# Patient Record
Sex: Male | Born: 1955 | Race: Black or African American | Hispanic: No | Smoking: Never smoker
Health system: Southern US, Community
[De-identification: ages and names within clinical notes are randomized; demographics above are authoritative.]

## PROBLEM LIST (undated history)

## (undated) DIAGNOSIS — I639 Cerebral infarction, unspecified: Secondary | ICD-10-CM

## (undated) DIAGNOSIS — I10 Essential (primary) hypertension: Secondary | ICD-10-CM

## (undated) DIAGNOSIS — L738 Other specified follicular disorders: Secondary | ICD-10-CM

## (undated) DIAGNOSIS — E039 Hypothyroidism, unspecified: Secondary | ICD-10-CM

## (undated) DIAGNOSIS — Q742 Other congenital malformations of lower limb(s), including pelvic girdle: Secondary | ICD-10-CM

## (undated) DIAGNOSIS — E119 Type 2 diabetes mellitus without complications: Secondary | ICD-10-CM

## (undated) DIAGNOSIS — M1990 Unspecified osteoarthritis, unspecified site: Secondary | ICD-10-CM

## (undated) DIAGNOSIS — D171 Benign lipomatous neoplasm of skin and subcutaneous tissue of trunk: Secondary | ICD-10-CM

## (undated) DIAGNOSIS — N182 Chronic kidney disease, stage 2 (mild): Secondary | ICD-10-CM

## (undated) DIAGNOSIS — D869 Sarcoidosis, unspecified: Secondary | ICD-10-CM

## (undated) DIAGNOSIS — Z86010 Personal history of colonic polyps: Secondary | ICD-10-CM

## (undated) DIAGNOSIS — M7989 Other specified soft tissue disorders: Secondary | ICD-10-CM

## (undated) DIAGNOSIS — E785 Hyperlipidemia, unspecified: Secondary | ICD-10-CM

## (undated) DIAGNOSIS — N419 Inflammatory disease of prostate, unspecified: Secondary | ICD-10-CM

## (undated) DIAGNOSIS — G8929 Other chronic pain: Secondary | ICD-10-CM

## (undated) DIAGNOSIS — Z96651 Presence of right artificial knee joint: Secondary | ICD-10-CM

## (undated) HISTORY — DX: Hyperlipidemia, unspecified: E78.5

## (undated) HISTORY — DX: Type 2 diabetes mellitus without complications: E11.9

## (undated) HISTORY — DX: Other congenital malformations of lower limb(s), including pelvic girdle: Q74.2

## (undated) HISTORY — DX: Unspecified osteoarthritis, unspecified site: M19.90

## (undated) HISTORY — PX: HERNIA REPAIR: SHX51

## (undated) HISTORY — PX: TOOTH EXTRACTION: SUR596

## (undated) HISTORY — DX: Chronic kidney disease, stage 2 (mild): N18.2

## (undated) HISTORY — DX: Personal history of colonic polyps: Z86.010

## (undated) HISTORY — DX: Other specified soft tissue disorders: M79.89

## (undated) HISTORY — PX: COLONOSCOPY W/ POLYPECTOMY: SHX1380

## (undated) HISTORY — DX: Cerebral infarction, unspecified: I63.9

## (undated) HISTORY — DX: Inflammatory disease of prostate, unspecified: N41.9

## (undated) HISTORY — DX: Essential (primary) hypertension: I10

## (undated) HISTORY — PX: ESOPHAGOGASTRODUODENOSCOPY: SHX1529

## (undated) HISTORY — DX: Other specified follicular disorders: L73.8

## (undated) HISTORY — DX: Benign lipomatous neoplasm of skin and subcutaneous tissue of trunk: D17.1

## (undated) HISTORY — PX: EYE SURGERY: SHX253

## (undated) HISTORY — DX: Sarcoidosis, unspecified: D86.9

---
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
## 1998-02-16 ENCOUNTER — Encounter: Admission: RE | Admit: 1998-02-16 | Discharge: 1998-02-16 | Payer: Self-pay | Admitting: Family Medicine

## 1998-12-26 ENCOUNTER — Encounter: Admission: RE | Admit: 1998-12-26 | Discharge: 1998-12-26 | Payer: Self-pay | Admitting: Family Medicine

## 1999-02-13 ENCOUNTER — Encounter: Admission: RE | Admit: 1999-02-13 | Discharge: 1999-02-13 | Payer: Self-pay | Admitting: Family Medicine

## 1999-12-26 ENCOUNTER — Encounter: Admission: RE | Admit: 1999-12-26 | Discharge: 1999-12-26 | Payer: Self-pay | Admitting: Sports Medicine

## 1999-12-27 ENCOUNTER — Encounter: Admission: RE | Admit: 1999-12-27 | Discharge: 1999-12-27 | Payer: Self-pay

## 1999-12-27 ENCOUNTER — Encounter: Payer: Self-pay | Admitting: Family Medicine

## 1999-12-27 LAB — CONVERTED CEMR LAB: Uric Acid, Serum: 3.9 mg/dL

## 2000-01-02 ENCOUNTER — Encounter: Admission: RE | Admit: 2000-01-02 | Discharge: 2000-01-02 | Payer: Self-pay | Admitting: Family Medicine

## 2000-01-15 ENCOUNTER — Encounter: Admission: RE | Admit: 2000-01-15 | Discharge: 2000-01-15 | Payer: Self-pay | Admitting: Family Medicine

## 2000-04-22 ENCOUNTER — Encounter: Admission: RE | Admit: 2000-04-22 | Discharge: 2000-04-22 | Payer: Self-pay | Admitting: Family Medicine

## 2001-01-17 ENCOUNTER — Encounter: Admission: RE | Admit: 2001-01-17 | Discharge: 2001-01-17 | Payer: Self-pay | Admitting: Family Medicine

## 2001-02-12 ENCOUNTER — Encounter: Admission: RE | Admit: 2001-02-12 | Discharge: 2001-02-12 | Payer: Self-pay | Admitting: Family Medicine

## 2001-02-12 ENCOUNTER — Encounter: Payer: Self-pay | Admitting: Family Medicine

## 2001-02-12 LAB — CONVERTED CEMR LAB
Blood Glucose, Fasting: 300 mg/dL
Hgb A1c MFr Bld: 11.2 %

## 2001-06-17 ENCOUNTER — Encounter: Admission: RE | Admit: 2001-06-17 | Discharge: 2001-06-17 | Payer: Self-pay | Admitting: Family Medicine

## 2003-03-04 ENCOUNTER — Encounter: Admission: RE | Admit: 2003-03-04 | Discharge: 2003-03-04 | Payer: Self-pay | Admitting: Family Medicine

## 2003-04-08 ENCOUNTER — Encounter: Admission: RE | Admit: 2003-04-08 | Discharge: 2003-04-08 | Payer: Self-pay | Admitting: Sports Medicine

## 2003-07-10 HISTORY — PX: TRANSTHORACIC ECHOCARDIOGRAM: SHX275

## 2004-03-15 ENCOUNTER — Encounter: Payer: Self-pay | Admitting: Cardiology

## 2004-03-15 ENCOUNTER — Inpatient Hospital Stay (HOSPITAL_COMMUNITY): Admission: EM | Admit: 2004-03-15 | Discharge: 2004-03-16 | Payer: Self-pay | Admitting: Emergency Medicine

## 2004-03-16 ENCOUNTER — Encounter: Payer: Self-pay | Admitting: Family Medicine

## 2004-03-16 LAB — CONVERTED CEMR LAB: Hgb A1c MFr Bld: 7.7 %

## 2004-03-29 ENCOUNTER — Ambulatory Visit: Payer: Self-pay | Admitting: Family Medicine

## 2004-04-19 ENCOUNTER — Ambulatory Visit: Payer: Self-pay | Admitting: Family Medicine

## 2004-06-06 ENCOUNTER — Ambulatory Visit: Payer: Self-pay | Admitting: Sports Medicine

## 2004-06-29 ENCOUNTER — Encounter: Payer: Self-pay | Admitting: Family Medicine

## 2004-06-29 ENCOUNTER — Ambulatory Visit: Payer: Self-pay | Admitting: Sports Medicine

## 2004-06-29 LAB — CONVERTED CEMR LAB: Anti Nuclear Antibody(ANA): NEGATIVE

## 2004-07-09 HISTORY — PX: ERCP: SHX60

## 2004-07-21 ENCOUNTER — Emergency Department (HOSPITAL_COMMUNITY): Admission: EM | Admit: 2004-07-21 | Discharge: 2004-07-21 | Payer: Self-pay | Admitting: *Deleted

## 2004-07-26 ENCOUNTER — Encounter: Payer: Self-pay | Admitting: Family Medicine

## 2004-07-26 ENCOUNTER — Ambulatory Visit: Payer: Self-pay | Admitting: Family Medicine

## 2004-07-26 LAB — CONVERTED CEMR LAB
ALT: 69 units/L
Albumin: 3.7 g/dL
BUN: 14 mg/dL
CO2: 26 meq/L
Creatinine, Ser: 1.3 mg/dL
Glucose, Bld: 93 mg/dL

## 2004-08-02 ENCOUNTER — Encounter: Admission: RE | Admit: 2004-08-02 | Discharge: 2004-08-02 | Payer: Self-pay | Admitting: Sports Medicine

## 2004-08-25 ENCOUNTER — Encounter: Payer: Self-pay | Admitting: Family Medicine

## 2004-08-25 LAB — CONVERTED CEMR LAB: Prothrombin Time: 13 s

## 2004-08-31 ENCOUNTER — Ambulatory Visit (HOSPITAL_COMMUNITY): Admission: RE | Admit: 2004-08-31 | Discharge: 2004-08-31 | Payer: Self-pay | Admitting: Gastroenterology

## 2004-09-21 ENCOUNTER — Ambulatory Visit (HOSPITAL_COMMUNITY): Admission: RE | Admit: 2004-09-21 | Discharge: 2004-09-21 | Payer: Self-pay | Admitting: Gastroenterology

## 2004-10-26 ENCOUNTER — Ambulatory Visit: Payer: Self-pay | Admitting: Family Medicine

## 2004-10-26 ENCOUNTER — Encounter: Payer: Self-pay | Admitting: Family Medicine

## 2005-12-13 ENCOUNTER — Ambulatory Visit: Payer: Self-pay | Admitting: Family Medicine

## 2005-12-13 ENCOUNTER — Encounter: Payer: Self-pay | Admitting: Family Medicine

## 2005-12-13 LAB — CONVERTED CEMR LAB
CO2: 27 meq/L
Calcium: 9.1 mg/dL
Creatinine, Ser: 1.57 mg/dL
Glucose, Bld: 95 mg/dL
Hgb A1c MFr Bld: 5.1 %
Potassium: 3.7 meq/L
Sodium: 139 meq/L
TSH: 2.194 microintl units/mL
Total Bilirubin: 0.4 mg/dL
Total Protein: 7.9 g/dL

## 2005-12-28 ENCOUNTER — Encounter: Payer: Self-pay | Admitting: Family Medicine

## 2005-12-28 ENCOUNTER — Ambulatory Visit (HOSPITAL_COMMUNITY): Admission: RE | Admit: 2005-12-28 | Discharge: 2005-12-28 | Payer: Self-pay | Admitting: Family Medicine

## 2005-12-28 ENCOUNTER — Ambulatory Visit: Payer: Self-pay | Admitting: Family Medicine

## 2005-12-28 LAB — CONVERTED CEMR LAB: Creatinine, Ser: 1.42 mg/dL

## 2006-01-02 ENCOUNTER — Encounter: Payer: Self-pay | Admitting: Family Medicine

## 2006-01-02 ENCOUNTER — Ambulatory Visit: Payer: Self-pay | Admitting: Family Medicine

## 2006-01-02 LAB — CONVERTED CEMR LAB
HDL: 43 mg/dL
LDL Cholesterol: 101 mg/dL

## 2006-01-07 ENCOUNTER — Ambulatory Visit: Payer: Self-pay | Admitting: Family Medicine

## 2006-01-07 ENCOUNTER — Encounter: Payer: Self-pay | Admitting: Family Medicine

## 2006-01-22 ENCOUNTER — Ambulatory Visit: Payer: Self-pay | Admitting: Family Medicine

## 2006-01-25 ENCOUNTER — Ambulatory Visit (HOSPITAL_COMMUNITY): Admission: RE | Admit: 2006-01-25 | Discharge: 2006-01-25 | Payer: Self-pay | Admitting: Sports Medicine

## 2006-02-21 ENCOUNTER — Ambulatory Visit: Payer: Self-pay | Admitting: Family Medicine

## 2006-05-07 ENCOUNTER — Ambulatory Visit: Payer: Self-pay | Admitting: Sports Medicine

## 2006-05-07 ENCOUNTER — Encounter: Payer: Self-pay | Admitting: Family Medicine

## 2006-05-07 LAB — CONVERTED CEMR LAB: Hgb A1c MFr Bld: 4.7 %

## 2006-06-03 ENCOUNTER — Ambulatory Visit: Payer: Self-pay | Admitting: Sports Medicine

## 2006-07-05 ENCOUNTER — Ambulatory Visit: Payer: Self-pay | Admitting: Family Medicine

## 2006-07-05 ENCOUNTER — Encounter: Payer: Self-pay | Admitting: Family Medicine

## 2006-07-05 LAB — CONVERTED CEMR LAB
CO2: 28 meq/L
Calcium: 9.2 mg/dL

## 2006-09-05 DIAGNOSIS — N183 Chronic kidney disease, stage 3 unspecified: Secondary | ICD-10-CM

## 2006-09-05 DIAGNOSIS — N189 Chronic kidney disease, unspecified: Secondary | ICD-10-CM | POA: Insufficient documentation

## 2006-09-05 HISTORY — DX: Chronic kidney disease, stage 3 unspecified: N18.30

## 2006-10-18 ENCOUNTER — Encounter: Payer: Self-pay | Admitting: Family Medicine

## 2006-10-18 ENCOUNTER — Ambulatory Visit: Payer: Self-pay | Admitting: Family Medicine

## 2006-10-18 DIAGNOSIS — I10 Essential (primary) hypertension: Secondary | ICD-10-CM | POA: Insufficient documentation

## 2006-10-18 DIAGNOSIS — F528 Other sexual dysfunction not due to a substance or known physiological condition: Secondary | ICD-10-CM

## 2006-10-18 DIAGNOSIS — F4323 Adjustment disorder with mixed anxiety and depressed mood: Secondary | ICD-10-CM

## 2006-10-18 DIAGNOSIS — E119 Type 2 diabetes mellitus without complications: Secondary | ICD-10-CM

## 2006-10-18 HISTORY — DX: Other sexual dysfunction not due to a substance or known physiological condition: F52.8

## 2006-10-18 HISTORY — DX: Adjustment disorder with mixed anxiety and depressed mood: F43.23

## 2006-10-18 LAB — CONVERTED CEMR LAB
ALT: 20 units/L (ref 0–53)
Albumin: 4.4 g/dL (ref 3.5–5.2)
Alkaline Phosphatase: 53 units/L (ref 39–117)
CO2: 25 meq/L (ref 19–32)
Calcium: 9.2 mg/dL (ref 8.4–10.5)
Glucose, Bld: 109 mg/dL — ABNORMAL HIGH (ref 70–99)

## 2006-12-12 ENCOUNTER — Telehealth: Payer: Self-pay | Admitting: Family Medicine

## 2007-01-13 ENCOUNTER — Encounter: Payer: Self-pay | Admitting: *Deleted

## 2007-01-20 ENCOUNTER — Telehealth: Payer: Self-pay | Admitting: Family Medicine

## 2007-01-21 ENCOUNTER — Emergency Department (HOSPITAL_COMMUNITY): Admission: EM | Admit: 2007-01-21 | Discharge: 2007-01-21 | Payer: Self-pay | Admitting: Emergency Medicine

## 2007-01-29 ENCOUNTER — Encounter: Payer: Self-pay | Admitting: Family Medicine

## 2007-02-20 ENCOUNTER — Ambulatory Visit: Payer: Self-pay | Admitting: Family Medicine

## 2007-02-20 DIAGNOSIS — E785 Hyperlipidemia, unspecified: Secondary | ICD-10-CM

## 2007-02-20 DIAGNOSIS — D869 Sarcoidosis, unspecified: Secondary | ICD-10-CM

## 2007-02-20 DIAGNOSIS — E1169 Type 2 diabetes mellitus with other specified complication: Secondary | ICD-10-CM

## 2007-02-20 DIAGNOSIS — S82009A Unspecified fracture of unspecified patella, initial encounter for closed fracture: Secondary | ICD-10-CM | POA: Insufficient documentation

## 2007-02-20 DIAGNOSIS — Z8679 Personal history of other diseases of the circulatory system: Secondary | ICD-10-CM | POA: Insufficient documentation

## 2007-02-20 LAB — CONVERTED CEMR LAB
Cholesterol, target level: 200 mg/dL
HDL goal, serum: 40 mg/dL
Hgb A1c MFr Bld: 5.5 %
LDL Goal: 100 mg/dL

## 2007-03-13 ENCOUNTER — Telehealth (INDEPENDENT_AMBULATORY_CARE_PROVIDER_SITE_OTHER): Payer: Self-pay | Admitting: *Deleted

## 2007-03-23 ENCOUNTER — Encounter: Payer: Self-pay | Admitting: Family Medicine

## 2007-03-23 DIAGNOSIS — Z87448 Personal history of other diseases of urinary system: Secondary | ICD-10-CM

## 2007-05-05 ENCOUNTER — Encounter: Payer: Self-pay | Admitting: Family Medicine

## 2007-05-05 ENCOUNTER — Ambulatory Visit: Payer: Self-pay | Admitting: Family Medicine

## 2007-05-05 LAB — CONVERTED CEMR LAB
BUN: 18 mg/dL (ref 6–23)
Calcium: 9.4 mg/dL (ref 8.4–10.5)
Chloride: 100 meq/L (ref 96–112)
Creatinine, Ser: 1.25 mg/dL (ref 0.40–1.50)
Glucose, Bld: 112 mg/dL — ABNORMAL HIGH (ref 70–99)
LDL Cholesterol: 103 mg/dL — ABNORMAL HIGH (ref 0–99)
Potassium: 4.2 meq/L (ref 3.5–5.3)
Triglycerides: 99 mg/dL (ref <150)
VLDL: 20 mg/dL (ref 0–40)

## 2007-05-12 ENCOUNTER — Ambulatory Visit: Payer: Self-pay | Admitting: Family Medicine

## 2007-05-12 ENCOUNTER — Encounter: Payer: Self-pay | Admitting: Family Medicine

## 2007-05-12 DIAGNOSIS — M13179 Monoarthritis, not elsewhere classified, unspecified ankle and foot: Secondary | ICD-10-CM | POA: Insufficient documentation

## 2007-05-16 ENCOUNTER — Encounter: Admission: RE | Admit: 2007-05-16 | Discharge: 2007-05-16 | Payer: Self-pay | Admitting: Sports Medicine

## 2007-05-19 ENCOUNTER — Encounter: Payer: Self-pay | Admitting: Family Medicine

## 2007-05-27 ENCOUNTER — Encounter: Payer: Self-pay | Admitting: Family Medicine

## 2007-07-17 ENCOUNTER — Encounter: Payer: Self-pay | Admitting: Family Medicine

## 2007-11-03 ENCOUNTER — Encounter: Payer: Self-pay | Admitting: Family Medicine

## 2008-01-14 ENCOUNTER — Encounter: Payer: Self-pay | Admitting: Family Medicine

## 2008-01-22 ENCOUNTER — Telehealth: Payer: Self-pay | Admitting: Family Medicine

## 2008-01-22 ENCOUNTER — Encounter: Payer: Self-pay | Admitting: Family Medicine

## 2008-01-30 ENCOUNTER — Telehealth: Payer: Self-pay | Admitting: Family Medicine

## 2008-03-10 ENCOUNTER — Ambulatory Visit: Payer: Self-pay | Admitting: Family Medicine

## 2008-03-10 DIAGNOSIS — K047 Periapical abscess without sinus: Secondary | ICD-10-CM | POA: Insufficient documentation

## 2008-03-10 DIAGNOSIS — L738 Other specified follicular disorders: Secondary | ICD-10-CM

## 2008-03-10 DIAGNOSIS — L678 Other hair color and hair shaft abnormalities: Secondary | ICD-10-CM

## 2008-03-10 HISTORY — DX: Other hair color and hair shaft abnormalities: L67.8

## 2008-03-10 HISTORY — DX: Other specified follicular disorders: L73.8

## 2008-03-10 LAB — CONVERTED CEMR LAB: Hgb A1c MFr Bld: 6.9 %

## 2008-03-17 ENCOUNTER — Encounter: Payer: Self-pay | Admitting: Family Medicine

## 2008-03-18 ENCOUNTER — Ambulatory Visit: Payer: Self-pay | Admitting: Family Medicine

## 2008-03-18 ENCOUNTER — Encounter: Payer: Self-pay | Admitting: Family Medicine

## 2008-03-22 LAB — CONVERTED CEMR LAB
ALT: 17 units/L (ref 0–53)
AST: 18 units/L (ref 0–37)
Albumin: 4.5 g/dL (ref 3.5–5.2)
Alkaline Phosphatase: 79 units/L (ref 39–117)
CO2: 25 meq/L (ref 19–32)
Cholesterol: 155 mg/dL (ref 0–200)
Creatinine, Ser: 1.33 mg/dL (ref 0.40–1.50)
Glucose, Bld: 180 mg/dL — ABNORMAL HIGH (ref 70–99)
Potassium: 3.4 meq/L — ABNORMAL LOW (ref 3.5–5.3)
Total CHOL/HDL Ratio: 2.5
Total Protein: 8.5 g/dL — ABNORMAL HIGH (ref 6.0–8.3)
VLDL: 24 mg/dL (ref 0–40)

## 2008-04-28 ENCOUNTER — Encounter: Payer: Self-pay | Admitting: Family Medicine

## 2008-06-21 ENCOUNTER — Telehealth: Payer: Self-pay | Admitting: Family Medicine

## 2009-03-15 ENCOUNTER — Telehealth: Payer: Self-pay | Admitting: *Deleted

## 2009-03-23 ENCOUNTER — Telehealth (INDEPENDENT_AMBULATORY_CARE_PROVIDER_SITE_OTHER): Payer: Self-pay | Admitting: *Deleted

## 2009-03-25 ENCOUNTER — Ambulatory Visit: Payer: Self-pay | Admitting: Family Medicine

## 2009-03-25 DIAGNOSIS — M545 Low back pain: Secondary | ICD-10-CM

## 2009-04-28 ENCOUNTER — Ambulatory Visit: Payer: Self-pay | Admitting: Family Medicine

## 2009-04-28 ENCOUNTER — Encounter: Payer: Self-pay | Admitting: Family Medicine

## 2009-04-28 DIAGNOSIS — M25562 Pain in left knee: Secondary | ICD-10-CM

## 2009-04-28 DIAGNOSIS — M25561 Pain in right knee: Secondary | ICD-10-CM | POA: Insufficient documentation

## 2009-04-29 LAB — CONVERTED CEMR LAB
ALT: 13 units/L (ref 0–53)
AST: 16 units/L (ref 0–37)
Albumin: 4.3 g/dL (ref 3.5–5.2)
Alkaline Phosphatase: 61 units/L (ref 39–117)
CO2: 25 meq/L (ref 19–32)
Calcium: 9.6 mg/dL (ref 8.4–10.5)
Chloride: 103 meq/L (ref 96–112)
Cholesterol: 126 mg/dL (ref 0–200)
HDL: 57 mg/dL (ref >39)
Sodium: 140 meq/L (ref 135–145)
TSH: 4.205 microintl units/mL (ref 0.350–4.500)
Total Bilirubin: 0.4 mg/dL (ref 0.3–1.2)

## 2009-05-06 ENCOUNTER — Telehealth: Payer: Self-pay | Admitting: Family Medicine

## 2009-05-20 ENCOUNTER — Ambulatory Visit: Payer: Self-pay | Admitting: Family Medicine

## 2009-05-20 DIAGNOSIS — E669 Obesity, unspecified: Secondary | ICD-10-CM | POA: Insufficient documentation

## 2009-08-16 ENCOUNTER — Telehealth: Payer: Self-pay | Admitting: *Deleted

## 2009-08-24 ENCOUNTER — Encounter: Payer: Self-pay | Admitting: Family Medicine

## 2009-08-25 ENCOUNTER — Telehealth: Payer: Self-pay | Admitting: Family Medicine

## 2010-01-06 ENCOUNTER — Encounter: Payer: Self-pay | Admitting: Family Medicine

## 2010-01-06 ENCOUNTER — Encounter: Payer: Self-pay | Admitting: *Deleted

## 2010-02-07 ENCOUNTER — Encounter: Payer: Self-pay | Admitting: Family Medicine

## 2010-02-07 ENCOUNTER — Ambulatory Visit: Payer: Self-pay | Admitting: Family Medicine

## 2010-02-07 LAB — CONVERTED CEMR LAB
ALT: 16 units/L (ref 0–53)
AST: 17 units/L (ref 0–37)
Albumin: 4.3 g/dL (ref 3.5–5.2)
CO2: 26 meq/L (ref 19–32)
Cholesterol: 137 mg/dL (ref 0–200)
Glucose, Bld: 162 mg/dL — ABNORMAL HIGH (ref 70–99)
LDL Cholesterol: 57 mg/dL (ref 0–99)
Total Bilirubin: 0.5 mg/dL (ref 0.3–1.2)
Total CHOL/HDL Ratio: 2.4
Total Protein: 8.1 g/dL (ref 6.0–8.3)
VLDL: 22 mg/dL (ref 0–40)

## 2010-02-20 ENCOUNTER — Ambulatory Visit: Payer: Self-pay | Admitting: Family Medicine

## 2010-07-25 ENCOUNTER — Ambulatory Visit: Admission: RE | Admit: 2010-07-25 | Discharge: 2010-07-25 | Payer: Self-pay | Source: Home / Self Care

## 2010-07-25 DIAGNOSIS — Q742 Other congenital malformations of lower limb(s), including pelvic girdle: Secondary | ICD-10-CM

## 2010-07-25 HISTORY — DX: Other congenital malformations of lower limb(s), including pelvic girdle: Q74.2

## 2010-07-25 LAB — CONVERTED CEMR LAB: Hgb A1c MFr Bld: 6.5 %

## 2010-07-30 ENCOUNTER — Encounter: Payer: Self-pay | Admitting: Gastroenterology

## 2010-08-08 NOTE — Assessment & Plan Note (Signed)
Summary: F/U-Diabetes/HTN/HLD/ Tooth Abscess   Vital Signs:  Patient profile:   55 year old male Height:      70 inches Weight:      225.6 pounds Temp:     98.6 degrees F oral Pulse rate:   73 / minute Pulse rhythm:   regular BP sitting:   151 / 93  (right arm) Cuff size:   large  Vitals Entered By: Loralee Pacas CMA (February 20, 2010 1:37 PM)  Primary Provider:  Everrett Coombe DO   History of Present Illness: 1. HTN- Pt. still with elevated BP, does not check at home.  Has not had any chest pain, cough, shortness of breath, headache.  Tolerating medications well  2.  T2DM:  Patient checks cbg's at home occassionally usually <200.  Has not had any times where he has felt like his blood sugars are too low.  Currently on metformin 500 in am, 1000 at bedtime and tolerating well without side effects.  Plans to make eye exam appt. for later this year  3.  Hyperlipidemia:  Tolerating simvastatin 80mg  well, no complaints of muscle aches/weakness  4. Tooth pain-  Has improved with antibiotics and mouth wash, plan to have extraction at dentist in first part of september, denies any trouble speaking, swallowing or sore throat     Diabetic Foot Exam Foot Inspection Is there a history of a foot ulcer?              No Is there a foot ulcer now?              No Can the patient see the bottom of their feet?          No Are the shoes appropriate in style and fit?          No Is there swelling or an abnormal foot shape?          No Are the toenails long?                No Are the toenails thick?                No Are the toenails ingrown?              No Is there heavy callous build-up?              No Is there pain in the calf muscle (Intermittent claudication) when walking?    NoIs there a claw toe deformity?              No Is there elevated skin temperature?            No Is there limited ankle dorsiflexion?            No Is there foot or ankle muscle weakness?            No  Diabetic  Foot Care Education Patient educated on appropriate care of diabetic feet.  Pulse Check          Right Foot          Left Foot Posterior Tibial:        normal            normal Dorsalis Pedis:        normal            normal  High Risk Feet? No   10-g (5.07) Semmes-Weinstein Monofilament Test           Right  Foot          Left Foot Visual Inspection     normal           normal Test Control      normal         normal Site 1         normal         normal Site 4         normal          Site 5         normal         normal Site 6         normal         normal  Current Medications (verified): 1)  Bayer Childrens Aspirin 81 Mg  Chew (Aspirin) .... One By Mouth Once Daily 2)  Enalapril Maleate 20 Mg Tabs (Enalapril Maleate) .... Take 2 Tablet By Mouth Once A Day 3)  Maxzide-25 37.5-25 Mg Tabs (Triamterene-Hctz) .... Take 1 Tablet By Mouth Once A Day 4)  Norvasc 10 Mg Tabs (Amlodipine Besylate) .... Take 1 Tablet By Mouth Once A Day 5)  Trazodone Hcl 100 Mg Tabs (Trazodone Hcl) .Marland Kitchen.. 1 Tablet By Mouth At Bedtime 6)  Simvastatin 80 Mg  Tabs (Simvastatin) .... Take Half Tablet By Mouth Every Day 7)  Glucometer With Lancets and Strips .... Use As Directed Qs 1 Mo 8)  Metformin Hcl 500 Mg Tabs (Metformin Hcl) .... One By Mouth in Am and Two in Pm 9)  Penicillin V Potassium 500 Mg Tabs (Penicillin V Potassium) .... Take One By Mouth Three Times A Day X 10 Days 10)  Peridex 0.12 % Soln (Chlorhexidine Gluconate) .... Use Two Times A Day  Allergies (verified): 1)  Metoprolol Succinate (Metoprolol Succinate)  Past History:  Past Surgical History: Echo 9/05-Echo 60% - 10/02/2004, ERCP- normal, except fluctuating cholestasis - 09/06/2004, Renal US: No hydronephrosis, no mass - 01/28/2006 s/p 2 hernia repairs Had colonoscopy >10 years ago, does not know what for.  He does not remember any changes in bowel habits, blood in stool, or relatives with early colon ca  Review of Systems       Pertinent  positives and negatives noted in HPI, Vitals signs noted   Physical Exam  General:  Well-developed,well-nourished,in no acute distress; alert,appropriate and cooperative throughout examination Eyes:  No corneal or conjunctival inflammation noted. EOMI. Perrla. Mouth:  poor dentition and teeth missing, no pharyngeal erythema or tosilar exudates Neck:  No deformities, masses, or tenderness noted. Lungs:  Normal respiratory effort, chest expands symmetrically. Lungs are clear to auscultation, no crackles or wheezes. Heart:  Normal rate and regular rhythm. S1 and S2 normal without gallop, murmur, click, rub or other extra sounds. Abdomen:  Bowel sounds positive,abdomen soft and non-tender without masses, organomegaly or hernias noted. Extremities:  Trace edema bilaterally in LE   Impression & Recommendations:  Problem # 1:  HYPERTENSION, ESSENTIAL NOS (ICD-401.9) BP still remains elevated despite multiple medications, Recheck of BP is better than when initially triaged at 136/86.  Still I would like for him to be <130/80 given his comorbidites of DM, hyperlipidemia, and previous CVA.  Will hold off on starting another medication for now but encouraged him to lose weight and try to limit his salt intake.   His updated medication list for this problem includes:    Enalapril Maleate 20 Mg Tabs (Enalapril maleate) .Marland Kitchen... Take 2 tablet by mouth once a day  Maxzide-25 37.5-25 Mg Tabs (Triamterene-hctz) .Marland Kitchen... Take 1 tablet by mouth once a day    Norvasc 10 Mg Tabs (Amlodipine besylate) .Marland Kitchen... Take 1 tablet by mouth once a day  Problem # 2:  DM, UNCOMPLICATED, TYPE II (ICD-250.00) A1C improved from previous, he is closer to his goal of 7.0.  Again lifestyle changes may help him reach both his goals for A1C as well as BP.  Would like to see him back in 3 months to see how his A1C is.  Currently on ACE-I, metformin and aspirin, tolerating well His updated medication list for this problem includes:     Bayer Childrens Aspirin 81 Mg Chew (Aspirin) ..... One by mouth once daily    Enalapril Maleate 20 Mg Tabs (Enalapril maleate) .Marland Kitchen... Take 2 tablet by mouth once a day    Metformin Hcl 500 Mg Tabs (Metformin hcl) ..... One by mouth in am and two in pm  Problem # 3:  HYPERLIPIDEMIA, BORDERLINE HDL (ICD-272.4) Lipids doing well, LDL and HDL at goal.  Tolerating simvastatin 80 mg well, no complaints of myalgias His updated medication list for this problem includes:    Simvastatin 80 Mg Tabs (Simvastatin) .Marland Kitchen... Take half tablet by mouth every day  Problem # 4:  ABSCESS, TOOTH (ICD-522.5) Improved with penicillin and mouthwash, he plans to have teeth extracted at  dentist in early september.  Complete Medication List: 1)  Bayer Childrens Aspirin 81 Mg Chew (Aspirin) .... One by mouth once daily 2)  Enalapril Maleate 20 Mg Tabs (Enalapril maleate) .... Take 2 tablet by mouth once a day 3)  Maxzide-25 37.5-25 Mg Tabs (Triamterene-hctz) .... Take 1 tablet by mouth once a day 4)  Norvasc 10 Mg Tabs (Amlodipine besylate) .... Take 1 tablet by mouth once a day 5)  Trazodone Hcl 100 Mg Tabs (Trazodone hcl) .Marland Kitchen.. 1 tablet by mouth at bedtime 6)  Simvastatin 80 Mg Tabs (Simvastatin) .... Take half tablet by mouth every day 7)  Glucometer With Lancets and Strips  .... Use as directed qs 1 mo 8)  Metformin Hcl 500 Mg Tabs (Metformin hcl) .... One by mouth in am and two in pm 9)  Penicillin V Potassium 500 Mg Tabs (Penicillin v potassium) .... Take one by mouth three times a day x 10 days 10)  Peridex 0.12 % Soln (Chlorhexidine gluconate) .... Use two times a day  Patient Instructions: 1)  It was a pleasure meeting you today. 2)  You are doing well with your diabetes and cholesterol 3)  Your blood pressure is still elevated, I don't want to add another medication at this time so I would encourage a little bit of weight loss.  Even a change of 3-4 pounds can help bring down your blood pressure and improve  your diabetes.  4)  For the colonoscopy you can just call your GI doctor to set up your screening colonoscopy. 5)  I would like to see you back in 3 months to recheck your A1C and to see how your blood pressure is doing.  Prevention & Chronic Care Immunizations   Influenza vaccine: Fluvax 3+  (04/28/2009)   Influenza vaccine due: 03/09/2010    Tetanus booster: 05/09/2004: Done.   Tetanus booster due: 05/09/2014    Pneumococcal vaccine: Not documented  Colorectal Screening   Hemoccult: Not documented    Colonoscopy: Not documented  Other Screening   PSA: Not documented   Smoking status: never  (02/07/2010)  Diabetes Mellitus   HgbA1C: 7.3  (02/07/2010)  Hemoglobin A1C due: 05/10/2010    Eye exam: Not documented   Diabetic eye exam action/deferral: Ophthalmology referral  (04/28/2009)    Foot exam: yes  (03/25/2009)   Foot exam action/deferral: Do today   High risk foot: No  (02/20/2010)   Foot care education: Done  (02/20/2010)   Foot exam due: 02/20/2008    Urine microalbumin/creatinine ratio: Not documented    Diabetes flowsheet reviewed?: Yes   Progress toward A1C goal: Improved  Lipids   Total Cholesterol: 137  (02/07/2010)   LDL: 57  (02/07/2010)   LDL Direct: Not documented   HDL: 58  (02/07/2010)   Triglycerides: 109  (02/07/2010)   Lipid panel due: 03/10/2010    SGOT (AST): 17  (02/07/2010)   SGPT (ALT): 16  (02/07/2010)   Alkaline phosphatase: 52  (02/07/2010)   Total bilirubin: 0.5  (02/07/2010)   Liver panel due: 03/10/2010    Lipid flowsheet reviewed?: Yes   Progress toward LDL goal: At goal    Stage of readiness to change (lipid management): Maintenance  Hypertension   Last Blood Pressure: 151 / 93  (02/20/2010)   Serum creatinine: 1.18  (02/07/2010)   Serum potassium 3.7  (02/07/2010)    Hypertension flowsheet reviewed?: Yes   Progress toward BP goal: Unchanged  Self-Management Support :   Personal Goals (by the next clinic visit)  :     Personal A1C goal: 7  (03/25/2009)     Personal blood pressure goal: 130/80  (02/20/2010)     Personal LDL goal: 70  (02/20/2010)    Diabetes self-management support: Copy of home glucose meter record, CBG self-monitoring log  (05/20/2009)    Hypertension self-management support: Written self-care plan, Education handout  (04/28/2009)    Lipid self-management support: Not documented    Nursing Instructions:     Appended Document: F/U-Diabetes/HTN/HLD/ Tooth Abscess     Allergies: 1)  Metoprolol Succinate (Metoprolol Succinate)   Complete Medication List: 1)  Bayer Childrens Aspirin 81 Mg Chew (Aspirin) .... One by mouth once daily 2)  Enalapril Maleate 20 Mg Tabs (Enalapril maleate) .... Take 2 tablet by mouth once a day 3)  Maxzide-25 37.5-25 Mg Tabs (Triamterene-hctz) .... Take 1 tablet by mouth once a day 4)  Norvasc 10 Mg Tabs (Amlodipine besylate) .... Take 1 tablet by mouth once a day 5)  Trazodone Hcl 100 Mg Tabs (Trazodone hcl) .Marland Kitchen.. 1 tablet by mouth at bedtime 6)  Simvastatin 80 Mg Tabs (Simvastatin) .... Take half tablet by mouth every day 7)  Glucometer With Lancets and Strips  .... Use as directed qs 1 mo 8)  Metformin Hcl 500 Mg Tabs (Metformin hcl) .... One by mouth in am and two in pm 9)  Penicillin V Potassium 500 Mg Tabs (Penicillin v potassium) .... Take one by mouth three times a day x 10 days 10)  Peridex 0.12 % Soln (Chlorhexidine gluconate) .... Use two times a day  Other Orders: [facility]- Est  Level 4 (91478)

## 2010-08-08 NOTE — Progress Notes (Signed)
Summary: refill  Phone Note Refill Request Call back at Home Phone [phone] Message from:  Patient  Refills Requested: Medication #1:  MOBIC 15 MG TABS take ONE daily x 10 days then prn  Medication #2:  PENICILLIN V POTASSIUM 500 MG TABS take one by mouth three times a day x 10 days. [employer] [facility] - fax- [phone]  Initial call taken by: De Nurse,  August 25, 2009 2:17 PM Caller: Patient  Follow-up for Phone Call        will refill x 1 more time BUT patient needs appointment.  please call and inform and schedule.  please fax to # provided by patient.  thanks. Follow-up by: Eustaquio Boyden  MD,  August 25, 2009 2:22 PM  Additional Follow-up for Phone Call Additional follow up Details #1::        faxed rx. called pt & told him that it had been done but he needed to be seen. states he will call after he gets back "from this court date in [place]" Additional Follow-up by: Golden Circle RN,  August 25, 2009 3:29 PM    Prescriptions: MOBIC 15 MG TABS (MELOXICAM) take ONE daily x 10 days then prn  #15 x 0   Entered and Authorized by:   Eustaquio Boyden  MD   Signed by:   Eustaquio Boyden  MD on 08/25/2009   Method used:   Print then Give to Patient   RxID:   [id number] PENICILLIN V POTASSIUM 500 MG TABS (PENICILLIN V POTASSIUM) take one by mouth three times a day x 10 days  #30 x 0   Entered and Authorized by:   Eustaquio Boyden  MD   Signed by:   Eustaquio Boyden  MD on 08/25/2009   Method used:   Print then Give to Patient   RxID:   [id number]

## 2010-08-08 NOTE — Letter (Signed)
Summary: Generic Letter  [facility]  [address]   [place], [place] [address]   Phone: [phone]  Fax: [phone]    01/06/2010  [facility] [address] [place], [place]  [address]  Dear Mr. [facility],  We have been unable to reach you by phone. We need to make you an appointment right away so you can keep getting your medicines and proper care. Please call the office when you gat this letter & make an appt for the same day or next. Thank you.  [phone]        Sincerely,   Golden Circle RN

## 2010-08-08 NOTE — Miscellaneous (Signed)
Summary: refills denied  Clinical Lists Changes rec'd refill request from [facility]. denied per md. faxed them back. called pt to make him an appt today. no answer at [phone]. called another number in chart [phone]. no answer. I mailed him a letter .Golden Circle RN  January 06, 2010 11:26 AM

## 2010-08-08 NOTE — Progress Notes (Signed)
Summary: re: records/ts  Phone Note Call from Patient Call back at Home Phone [phone]   Caller: Patient Summary of Call: Needs to talk to Dr. Sharen Hones about his medical records because the pt has to go to court. Initial call taken by: Clydell Hakim,  August 16, 2009 10:47 AM  Follow-up for Phone Call        CALLED PT. pt needs his records for a court date in [place]. pt needs to come in and fill out roi. may have to pay fee for retrieval of chart out of storage. will be billed for completion of copying and will have to pay the bill before receiving records. pt agreed and verbalized understanding. Follow-up by: Arlyss Repress CMA,,  August 16, 2009 11:37 AM

## 2010-08-08 NOTE — Assessment & Plan Note (Signed)
Summary: infection in mouth,tcb   Vital Signs:  Patient profile:   55 year old male Height:      70 inches Weight:      220.7 pounds Pulse rate:   84 / minute BP sitting:   157 / 89  (left arm) Cuff size:   regular  Vitals Entered By: Arlyss Repress CMA, (February 07, 2010 10:19 AM) CC: refill all meds. request rx for abx before seeing his dentist. Is Patient Diabetic? Yes Pain Assessment Patient in pain? no        Primary Care Provider:  Eustaquio Boyden  MD  CC:  refill all meds. request rx for abx before seeing his dentist..  History of Present Illness: Patient in for BP med refills, has apt with new primary MD on 8/15; but needed mail order placed.  He also is having severe pain and swelling of broken, abcess, lower molars.  He says that he will be able to see a dentist in 2 months.  Past provider prescribed penicillin for this.  He admits to poor self care and follow up.  He did have his meds with him today.  He was fasting so lipids and CMET were drawn for apt on 8/15.  Habits & Providers  Alcohol-Tobacco-Diet     Tobacco Status: never  Current Medications (verified): 1)  Bayer Childrens Aspirin 81 Mg  Chew (Aspirin) .... One By Mouth Once Daily 2)  Enalapril Maleate 20 Mg Tabs (Enalapril Maleate) .... Take 2 Tablet By Mouth Once A Day 3)  Maxzide-25 37.5-25 Mg Tabs (Triamterene-Hctz) .... Take 1 Tablet By Mouth Once A Day 4)  Norvasc 10 Mg Tabs (Amlodipine Besylate) .... Take 1 Tablet By Mouth Once A Day 5)  Trazodone Hcl 100 Mg Tabs (Trazodone Hcl) .Marland Kitchen.. 1 Tablet By Mouth At Bedtime 6)  Simvastatin 80 Mg  Tabs (Simvastatin) .... Take Half Tablet By Mouth Every Day 7)  Glucometer With Lancets and Strips .... Use As Directed Qs 1 Mo 8)  Metformin Hcl 500 Mg Tabs (Metformin Hcl) .... One By Mouth in Am and Two in Pm 9)  Penicillin V Potassium 500 Mg Tabs (Penicillin V Potassium) .... Take One By Mouth Three Times A Day X 10 Days 10)  Peridex 0.12 % Soln (Chlorhexidine  Gluconate) .... Use Two Times A Day  Allergies (verified): 1)  Metoprolol Succinate (Metoprolol Succinate)  Review of Systems General:  Denies chills and fever. ENT:  tooth ache. CV:  Denies chest pain or discomfort.  Physical Exam  General:  In no distress Mouth:  Three left lower molars broken to gum, gum abcess, + adenopathy Lungs:  normal respiratory effort and normal breath sounds.   Heart:  normal rate and regular rhythm.     Impression & Recommendations:  Problem # 1:  ABSCESS, TOOTH (ICD-522.5) Gave #30 penicillin for 10 day course now, when needed, and before dental apt.  Add Peridex oral wash. Orders: [facility]- Est  Level 4 (04540)  Problem # 2:  HYPERTENSION, ESSENTIAL NOS (ICD-401.9) refilled, folllow up with prmary MD this month His updated medication list for this problem includes:    Enalapril Maleate 20 Mg Tabs (Enalapril maleate) .Marland Kitchen... Take 2 tablet by mouth once a day    Maxzide-25 37.5-25 Mg Tabs (Triamterene-hctz) .Marland Kitchen... Take 1 tablet by mouth once a day    Norvasc 10 Mg Tabs (Amlodipine besylate) .Marland Kitchen... Take 1 tablet by mouth once a day  Orders: Lipid-[facility] ([id number]) Comp Met-[facility] ([id number]) [facility]- Est  Level  4 (99214)  Problem # 3:  HYPERLIPIDEMIA, BORDERLINE HDL (ICD-272.4) checked lipids and CMET today His updated medication list for this problem includes:    Simvastatin 80 Mg Tabs (Simvastatin) .Marland Kitchen... Take half tablet by mouth every day  Complete Medication List: 1)  Bayer Childrens Aspirin 81 Mg Chew (Aspirin) .... One by mouth once daily 2)  Enalapril Maleate 20 Mg Tabs (Enalapril maleate) .... Take 2 tablet by mouth once a day 3)  Maxzide-25 37.5-25 Mg Tabs (Triamterene-hctz) .... Take 1 tablet by mouth once a day 4)  Norvasc 10 Mg Tabs (Amlodipine besylate) .... Take 1 tablet by mouth once a day 5)  Trazodone Hcl 100 Mg Tabs (Trazodone hcl) .Marland Kitchen.. 1 tablet by mouth at bedtime 6)  Simvastatin 80 Mg Tabs (Simvastatin) .... Take half tablet by mouth  every day 7)  Glucometer With Lancets and Strips  .... Use as directed qs 1 mo 8)  Metformin Hcl 500 Mg Tabs (Metformin hcl) .... One by mouth in am and two in pm 9)  Penicillin V Potassium 500 Mg Tabs (Penicillin v potassium) .... Take one by mouth three times a day x 10 days 10)  Peridex 0.12 % Soln (Chlorhexidine gluconate) .... Use two times a day  Other Orders: A1C-[facility] (04540)  Patient Instructions: 1)  You must return on 8/15 to discuss your health with Dr. Denyse Amass 2)  Take care of yourself Prescriptions: PERIDEX 0.12 % SOLN (CHLORHEXIDINE GLUCONATE) use two times a day  #1 x 3   Entered and Authorized by:   Luretha Murphy NP   Signed by:   Luretha Murphy NP on 02/07/2010   Method used:   Printed then faxed to ...         RxID:   [id number] PENICILLIN V POTASSIUM 500 MG TABS (PENICILLIN V POTASSIUM) take one by mouth three times a day x 10 days  #90 x 0   Entered and Authorized by:   Luretha Murphy NP   Signed by:   Luretha Murphy NP on 02/07/2010   Method used:   Printed then faxed to ...         RxID:   [id number] METFORMIN HCL 500 MG TABS (METFORMIN HCL) one by mouth in am and two in pm  #270 x 1   Entered and Authorized by:   Luretha Murphy NP   Signed by:   Luretha Murphy NP on 02/07/2010   Method used:   Printed then faxed to ...         RxID:   [id number] TRAZODONE HCL 100 MG TABS (TRAZODONE HCL) 1 tablet by mouth at bedtime  #90 x 1   Entered and Authorized by:   Luretha Murphy NP   Signed by:   Luretha Murphy NP on 02/07/2010   Method used:   Printed then faxed to ...         RxID:   [id number] NORVASC 10 MG TABS (AMLODIPINE BESYLATE) Take 1 tablet by mouth once a day  #90 x 1   Entered and Authorized by:   Luretha Murphy NP   Signed by:   Luretha Murphy NP on 02/07/2010   Method used:   Printed then faxed to ...         RxID:   [id number] MAXZIDE-25 37.5-25 MG TABS (TRIAMTERENE-HCTZ) Take 1 tablet by mouth once a day  #90 x 1   Entered and Authorized by:    Luretha Murphy NP   Signed by:   Luretha Murphy NP on  02/07/2010   Method used:   Printed then faxed to ...         RxID:   [id number] ENALAPRIL MALEATE 20 MG TABS (ENALAPRIL MALEATE) Take 2 tablet by mouth once a day  #180 x 1   Entered and Authorized by:   Luretha Murphy NP   Signed by:   Luretha Murphy NP on 02/07/2010   Method used:   Printed then faxed to ...         RxID:   [id number]   Laboratory Results   Blood Tests   Date/Time Received: February 07, 2010 10:42 AM  Date/Time Reported: February 07, 2010 12:05 PM   HGBA1C: 7.3%   (Normal Range: Non-Diabetic - 3-6%   Control Diabetic - 6-8%)  Comments: ...............test performed by......Marland KitchenBonnie A. [place], MLS (ASCP)cm

## 2010-08-08 NOTE — Miscellaneous (Signed)
Summary: ROI  ROI   Imported By: Bradly Bienenstock 08/24/2009 16:45:56  _____________________________________________________________________  External Attachment:    Type:   Image     Comment:   External Document

## 2010-08-08 NOTE — Assessment & Plan Note (Signed)
Summary: routine visit/rash on chin/eo   Vital Signs:  Patient Profile:   55 Years Old Male Height:     70 inches Weight:      205 pounds Temp:     98.0 degrees F Pulse rate:   65 / minute BP sitting:   157 / 87  Pt. in pain?   no  Vitals Entered By: Jone Baseman CMA (March 10, 2008 9:12 AM)                   PCP:  Eustaquio Boyden  MD  Chief Complaint:  routine visit.  History of Present Illness: CC: f/u, rash on chin and stomach, and abscess  1. HTN - Pt on enalapril 40mg  qd, norvasc 10, maxzide 37.5/25.  pt has been off meds x 2 months.  States just didn't refill.  Has had HA, mild that resolve on own.  No acute acute vision changes, chest pain or tightness, urinary changes, SOB.   2. T2DM - continues to be diet controlled, HgA1c 6.9 today, which is an increase.   + paresthesias in feet, no chest pain or tightness.  Pt is to see eye doctor soon.  States he will call and make an appointment.  Pt would like scripts for all meds and new glucometer.  3. Dental hygeine -  Currently with abscess in left upper gumline, states currently waiting for card to see dentist.  Poor dentition, several teeth missing. Is supposedly gonna see dentist this month.  Denies fevers/chills.  4. 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  no itching on face.  also with rash along left abdomen, started very pruritic.  now just scaly.  5. preventative med - to schedule colonoscopy.  needs PSA.    Current Allergies: ! GLUCOPHAGE (METFORMIN HCL) METOPROLOL SUCCINATE (METOPROLOL SUCCINATE)      Physical Exam  General:     Well-developed,well-nourished,in no acute distress; alert,appropriate and cooperative throughout examination Mouth:     poor dentition.  left upper gumline abscess, tender along maxillary sinus. Lungs:     Normal respiratory effort, chest expands symmetrically. Lungs are clear to auscultation, no  crackles or wheezes. Heart:     Normal rate and regular rhythm. S1 and S2 normal without gallop, murmur, click, rub or other extra sounds. Abdomen:     Bowel sounds positive,abdomen soft and non-tender without masses, organomegaly or hernias noted. Skin:     pustular erythematous rash along chin in beard distribution.  also with scaly annular rash along left abdomen, 2 x 5 cm.    Impression & Recommendations:  Problem # 1:  HYPERTENSION, BENIGN SYSTEMIC (ICD-401.1) Assessment: Deteriorated deteriorated - currently 157/87.  to start taking meds again.  His updated medication list for this problem includes:    Enalapril Maleate 20 Mg Tabs (Enalapril maleate) .Marland Kitchen... Take 2 tablet by mouth once a day    Maxzide-25 37.5-25 Mg Tabs (Triamterene-hctz) .Marland Kitchen... Take 1 tablet by mouth once a day    Norvasc 10 Mg Tabs (Amlodipine besylate) .Marland Kitchen... Take 1 tablet by mouth once a day  Orders: [facility]- Est  Level 4 (76195)  Future Orders: Comp Met-[facility] ([id number]) ... 03/17/2009   Problem # 2:  HYPERLIPIDEMIA, BORDERLINE HDL (ICD-272.4) Assessment: Comment Only check FLP.  to start meds again. His updated medication list for this problem includes:    Simvastatin 80 Mg Tabs (Simvastatin) .Marland Kitchen... Take half tablet by mouth every day  Future Orders: Comp Met[facility] [id number]) ... 03/17/2009 Lipid-[facility] ([id number]) ... 03/18/2009   Problem # 3:  DM, UNCOMPLICATED, TYPE II (ICD-250.00) Assessment: Deteriorated to start meds again.  poor control currently, HgA1c 6.9%.  recheck at next visit, if high start metformin. His updated medication list for this problem includes:    Bayer Childrens Aspirin 81 Mg Chew (Aspirin) ..... One by mouth once daily    Enalapril Maleate 20 Mg Tabs (Enalapril maleate) .Marland Kitchen... Take 2 tablet by mouth once a day  Orders: A1C-[facility] (53664) UA Microalbumin-[facility] (40347) [facility]- Est  Level 4 (42595)   Problem # 4:  SKIN RASH (ICD-782.1) Assessment: Comment Only KOH negative both  sites.  continue to monitor. Orders: KOH-[facility] ([id number]) [facility]- Est  Level 4 (64332)   Problem # 5:  PSEUDOFOLLICULITIS BARBAE (ICD-704.8) Assessment: New abx x 10 days.  monitor response.  Problem # 6:  ABSCESS, TOOTH (ICD-522.5) Assessment: New prescribed clindamycin 300mg  PO QID x 10 days.  advised to take with yogurt.  Complete Medication List: 1)  Bayer Childrens Aspirin 81 Mg Chew (Aspirin) .... One by mouth once daily 2)  Enalapril Maleate 20 Mg Tabs (Enalapril maleate) .... Take 2 tablet by mouth once a day 3)  Maxzide-25 37.5-25 Mg Tabs (Triamterene-hctz) .... Take 1 tablet by mouth once a day 4)  Norvasc 10 Mg Tabs (Amlodipine besylate) .... Take 1 tablet by mouth once a day 5)  Trazodone Hcl 100 Mg Tabs (Trazodone hcl) .Marland Kitchen.. 1 tablet by mouth at bedtime 6)  Viagra 50 Mg Tabs (Sildenafil citrate) .... Take 1 tablet by mouth once a day 7)  Simvastatin 80 Mg Tabs (Simvastatin) .... Take half tablet by mouth every day 8)  Cleocin 300 Mg Caps (Clindamycin hcl) .... Take one by mouth qid x 10 days  Other Orders: Gastroenterology Referral (GI)   Patient Instructions: 1)  Please schedule a follow up appointment in one month. 2)  Keep watching your diet - eat more grilled foods and more vegetables and less fatty, fried foods - you have gained weight since your last visit!  and your A1c was higher this time.  If it continues to rise, we will need to start medicine for diabetes. 3)  Start checking your blood sugars daily.  Sometimes in the morning fasting, sometimes 2 hours after eating.  Keep a log and bring it in next visit. 4)  Keep taking your medicines - we will send all for refills for 3 months at a time.   ] Laboratory Results   Urine Tests  Date/Time Received: March 10, 2008 10:07 AM  Date/Time Reported: March 10, 2008 10:11 AM   Microalbumin (urine): trace mg/L   Comments: ...........test performed by...........Marland KitchenTerese Door, CMA   Blood Tests   Date/Time  Received: March 10, 2008 9:22 AM  Date/Time Reported: March 10, 2008 9:31 AM   HGBA1C: 6.9%   (Normal Range: Non-Diabetic - 3-6%   Control Diabetic - 6-8%)  Comments: ...........test performed by...........Marland KitchenTerese Door, CMA  Date/Time Received: March 10, 2008 10:05AM  Date/Time Reported: March 10, 2008 10:56 AM   Other Tests  Skin KOH: negative on chin and belly  Comments: ...............test performed by......Marland KitchenBonnie A. [place], [place] (ASCP)

## 2010-08-10 NOTE — Assessment & Plan Note (Signed)
Summary: DM/HTN med refills   Vital Signs:  Patient profile:   55 year old male Height:      70 inches Weight:      210.7 pounds BMI:     30.34 Pulse rate:   58 / minute BP sitting:   163 / 86  (right arm) Cuff size:   large  Vitals Entered By: Arlyss Repress CMA, (July 25, 2010 2:04 PM) CC: f/up DM and refill meds. Is Patient Diabetic? Yes Pain Assessment Patient in pain? no        Diabetic Foot Exam Foot Inspection Is there a history of a foot ulcer?              No Is there a foot ulcer now?              No Can the patient see the bottom of their feet?          No Are the shoes appropriate in style and fit?          No Is there swelling or an abnormal foot shape?          No Are the toenails long?                No Are the toenails thick?                No Are the toenails ingrown?              No Is there heavy callous build-up?              No Is there pain in the calf muscle (Intermittent claudication) when walking?    NoIs there a claw toe deformity?              No Is there elevated skin temperature?            No Is there limited ankle dorsiflexion?            No Is there foot or ankle muscle weakness?            No  Diabetic Foot Care Education Patient educated on appropriate care of diabetic feet.  Pulse Check          Right Foot          Left Foot Posterior Tibial:        normal            normal Dorsalis Pedis:        normal            normal  High Risk Feet? No Set Next Diabetic Foot Exam here: 08/02/2011   10-g (5.07) Semmes-Weinstein Monofilament Test Performed by: Everrett Coombe DO          Right Foot          Left Foot Visual Inspection     normal           normal Test Control      normal         normal Site 1                    normal Site 4         normal         normal Site 5         normal         normal Site 6         normal  normal  Impression      normal         normal   Primary Provider:  Everrett Coombe DO  CC:  f/up DM  and refill meds..  History of Present Illness: Pt. here for f/u for diabetes, htn, tooth pain and to refill his meds.   1. Diabetes:  Feels like he is doing well, has had 15lb weight loss since last visit.  Is eating less starch and eating more leaner meats and vegetables.  Checking sugars occasionally.  Continues to do well on current medications.  Also has hammer toe starting to give him problems on L foot, wondering if something can be done to treat this so that the rubbing does not cause and ulcer.  Wants referral to podiatry for toenail clipping, having increased difficulty with clipping without nicking skin.   2. HTN: BP elevated today.  Taking all medications as prescribed.  Has high amount of salt intake, especially at breakfast.  Denies headaches, chest pain, or vision changes.    3.  Tooth pain/abscess:  Still using mouthwash.  Has started to have teeth extracted.  No furthe pain of problems at this point.    Also needs letter for court stating his medical conditions for child support purposes.  Current Medications (verified): 1)  Bayer Childrens Aspirin 81 Mg  Chew (Aspirin) .... One By Mouth Once Daily 2)  Enalapril Maleate 20 Mg Tabs (Enalapril Maleate) .... Take 2 Tablet By Mouth Once A Day 3)  Maxzide-25 37.5-25 Mg Tabs (Triamterene-Hctz) .... Take 1 Tablet By Mouth Once A Day 4)  Norvasc 10 Mg Tabs (Amlodipine Besylate) .... Take 1 Tablet By Mouth Once A Day 5)  Trazodone Hcl 100 Mg Tabs (Trazodone Hcl) .Marland Kitchen.. 1 Tablet By Mouth At Bedtime 6)  Simvastatin 80 Mg  Tabs (Simvastatin) .... Take Half Tablet By Mouth Every Day 7)  Glucometer With Lancets and Strips .... Use As Directed Qs 1 Mo 8)  Metformin Hcl 500 Mg Tabs (Metformin Hcl) .... One By Mouth in Am and Two in Pm 9)  Penicillin V Potassium 500 Mg Tabs (Penicillin V Potassium) .... Take One By Mouth Three Times A Day X 10 Days 10)  Peridex 0.12 % Soln (Chlorhexidine Gluconate) .... Use Two Times A Day  Allergies  (verified): 1)  Metoprolol Succinate (Metoprolol Succinate) PMH-FH-SH reviewed-no changes except otherwise noted  Physical Exam  General:  Well-developed,well-nourished,in no acute distress; alert,appropriate and cooperative throughout examination Eyes:  vision grossly intact, pupils equal, pupils round, and pupils reactive to light, sclerae mildly injected Mouth:  Oral mucosa and oropharynx without lesions or exudates.  Lower molars extracted. Neck:  No deformities, masses, or tenderness noted. Lungs:  Normal respiratory effort, chest expands symmetrically. Lungs are clear to auscultation, no crackles or wheezes. Heart:  Normal rate and regular rhythm. S1 and S2 normal without gallop, murmur, click, rub or other extra sounds. Abdomen:  Bowel sounds positive,abdomen soft and non-tender without masses, organomegaly or hernias noted.  Diabetes Management Exam:    Foot Exam (with socks and/or shoes not present):       Sensory-Monofilament:          Left foot: normal          Right foot: normal   Impression & Recommendations:  Problem # 1:  DM, UNCOMPLICATED, TYPE II (ICD-250.00) Assessment Improved Doing well, A1C improved.  Encouraged to continue weight loss.  Recheck A1C in 3 months.  Podiatry referral for toenail clipping.  His updated  medication list for this problem includes:    Bayer Childrens Aspirin 81 Mg Chew (Aspirin) ..... One by mouth once daily    Enalapril Maleate 20 Mg Tabs (Enalapril maleate) .Marland Kitchen... Take 2 tablet by mouth once a day    Metformin Hcl 500 Mg Tabs (Metformin hcl) ..... One by mouth in am and two in pm  Orders: A1C-[facility] (16109) Podiatry Referral (Podiatry) [facility]- Est  Level 4 (60454)  Problem # 2:  HYPERTENSION, ESSENTIAL NOS (ICD-401.9) Assessment: Deteriorated Encouraged continued lifestyle changes for now, reducing sodium intake and continued weight loss.  Will try to avoid another antihypertensive but low threshold if not improved at next visit since hx  of CVA.  Return in 1 month for BP recheck His updated medication list for this problem includes:    Enalapril Maleate 20 Mg Tabs (Enalapril maleate) .Marland Kitchen... Take 2 tablet by mouth once a day    Maxzide-25 37.5-25 Mg Tabs (Triamterene-hctz) .Marland Kitchen... Take 1 tablet by mouth once a day    Norvasc 10 Mg Tabs (Amlodipine besylate) .Marland Kitchen... Take 1 tablet by mouth once a day  Orders: [facility]- Est  Level 4 (09811)  Problem # 3:  ABSCESS, TOOTH (ICD-522.5) Assessment: Improved Improved, in the process of having teeth extracted.  Continue with mouth wash as needed.  Orders: [facility]- Est  Level 4 (91478)  Problem # 4:  HAMMER TOE (ICD-755.66) Assessment: New Hammertoe of left second digit.  Concern for possible friction creating ulceration.  Given hammer toe splint to try. Orders: [facility]- Est  Level 4 (29562)  Complete Medication List: 1)  Bayer Childrens Aspirin 81 Mg Chew (Aspirin) .... One by mouth once daily 2)  Enalapril Maleate 20 Mg Tabs (Enalapril maleate) .... Take 2 tablet by mouth once a day 3)  Maxzide-25 37.5-25 Mg Tabs (Triamterene-hctz) .... Take 1 tablet by mouth once a day 4)  Norvasc 10 Mg Tabs (Amlodipine besylate) .... Take 1 tablet by mouth once a day 5)  Trazodone Hcl 100 Mg Tabs (Trazodone hcl) .Marland Kitchen.. 1 tablet by mouth at bedtime 6)  Simvastatin 80 Mg Tabs (Simvastatin) .... Take half tablet by mouth every day 7)  Glucometer With Lancets and Strips  .... Use as directed qs 1 mo 8)  Metformin Hcl 500 Mg Tabs (Metformin hcl) .... One by mouth in am and two in pm 9)  Penicillin V Potassium 500 Mg Tabs (Penicillin v potassium) .... Take one by mouth three times a day x 10 days 10)  Peridex 0.12 % Soln (Chlorhexidine gluconate) .... Use two times a day  Other Orders: Influenza Vaccine M[facility] (13086)  Patient Instructions: 1)  Nice seeing you today. 2)  You are doing great with diabetes.  Keep up the good work.  I feel that with continued weight loss and continued diet changes (cutting out bacon,  sausage, lunch meats and other foods high in salt)  we can get your blood pressure under better control.  I would like to see you back after a month of lifestyle changes to see if your pressure has improved.  If your pressure is not improving we need to consider changing your medicines around again.  I will also do your letter and mail it to you stating your medical conditions. I will also put in a referral for a podiatrist to trim your toenails.  Please be sure to follow-up in one month. Prescriptions: METFORMIN HCL 500 MG TABS (METFORMIN HCL) one by mouth in am and two in pm  #270 x 1  Entered and Authorized by:   Everrett Coombe DO   Signed by:   Everrett Coombe DO on 07/25/2010   Method used:   Electronically to        [facility]* (retail)       [address] [place], [place]  [address]       Ph: [phone]       Fax: [phone]   RxID:   [id number] PERIDEX 0.12 % SOLN (CHLORHEXIDINE GLUCONATE) use two times a day  #1 x 3   Entered and Authorized by:   Everrett Coombe DO   Signed by:   Everrett Coombe DO on 07/25/2010   Method used:   Electronically to        [facility]* (retail)       [address] [place], [place]  [address]       Ph: [phone]       Fax: [phone]   RxID:   [id number] SIMVASTATIN 80 MG  TABS (SIMVASTATIN) take half tablet by mouth every day  #60 x 0   Entered and Authorized by:   Everrett Coombe DO   Signed by:   Everrett Coombe DO on 07/25/2010   Method used:   Electronically to        [facility]* (retail)       [address] [place], [place]  [address]       Ph: [phone]       Fax: [phone]   RxID:   [id number] TRAZODONE HCL 100 MG TABS (TRAZODONE HCL) 1 tablet by mouth at bedtime  #90 x 1   Entered and Authorized by:   Everrett Coombe DO   Signed by:   Everrett Coombe DO on 07/25/2010   Method used:   Electronically to        [facility]* (retail)       [address] [place], [place]  [address]       Ph: [phone]        Fax: [phone]   RxID:   [id number] NORVASC 10 MG TABS (AMLODIPINE BESYLATE) Take 1 tablet by mouth once a day  #90 x 1   Entered and Authorized by:   Everrett Coombe DO   Signed by:   Everrett Coombe DO on 07/25/2010   Method used:   Electronically to        [facility]* (retail)       [address] [place], [place]  [address]       Ph: [phone]       Fax: [phone]   RxID:   [id number] MAXZIDE-25 37.5-25 MG TABS (TRIAMTERENE-HCTZ) Take 1 tablet by mouth once a day  #90 x 1   Entered and Authorized by:   Everrett Coombe DO   Signed by:   Everrett Coombe DO on 07/25/2010   Method used:   Electronically to        [facility] (retail)       [address] [place], [place]  [address]       Ph: [phone]       Fax: [phone]   RxID:   [id number] ENALAPRIL MALEATE 20 MG TABS (ENALAPRIL MALEATE) Take 2 tablet by mouth once a day  #180 x 1   Entered and Authorized by:  Everrett Coombe DO   Signed by:   Everrett Coombe DO on 07/25/2010   Method used:   Electronically to        [facility]* (retail)       [address] [place], [place]  [address]       Ph: [phone]       Fax: [phone]   RxID:   [id number]    Orders Added: 1)  A1C-[facility] [83036] 2)  Influenza Vaccine MCR [00025] 3)  Podiatry Referral [Podiatry] 4)  [facility]- Est  Level 4 [44010]   Immunizations Administered:  Influenza Vaccine # 1:    Vaccine Type: Fluvax MCR    Site: left deltoid    Mfr: GlaxoSmithKline    Dose: 0.5 ml    Route: IM    Given by: Arlyss Repress CMA,    Exp. Date: 01/06/2011    Lot #: [id number]    VIS given: 01/31/10 version given July 25, 2010.   Immunizations Administered:  Influenza Vaccine # 1:    Vaccine Type: Fluvax MCR    Site: left deltoid    Mfr: GlaxoSmithKline    Dose: 0.5 ml    Route: IM    Given by: Arlyss Repress CMA,    Exp. Date: 01/06/2011    Lot #: [id number]    VIS given: 01/31/10 version given July 25, 2010.  Flu Vaccine  Consent Questions:    Do you have a history of severe allergic reactions to this vaccine? no    Any prior history of allergic reactions to egg and/or gelatin? no    Do you have a sensitivity to the preservative Thimersol? no    Do you have a past history of Guillan-Barre Syndrome? no    Do you currently have an acute febrile illness? no    Have you ever had a severe reaction to latex? no    Vaccine information given and explained to patient? yes  Laboratory Results   Blood Tests   Date/Time Received: July 25, 2010 2:05 PM  Date/Time Reported: July 25, 2010 2:17 PM   HGBA1C: 6.5%   (Normal Range: Non-Diabetic - 3-6%   Control Diabetic - 6-8%)  Comments: ...............test performed by......Marland KitchenBonnie A. [place], MLS (ASCP)cm       Prevention & Chronic Care Immunizations   Influenza vaccine: Fluvax MCR  (07/25/2010)   Influenza vaccine due: 03/09/2010    Tetanus booster: 05/09/2004: Done.   Tetanus booster due: 05/09/2014    Pneumococcal vaccine: Not documented  Colorectal Screening   Hemoccult: Not documented    Colonoscopy: Not documented  Other Screening   PSA: Not documented   Smoking status: never  (02/07/2010)  Diabetes Mellitus   HgbA1C: 6.5  (07/25/2010)   Hemoglobin A1C due: 05/10/2010    Eye exam: Not documented   Diabetic eye exam action/deferral: Ophthalmology referral  (04/28/2009)    Foot exam: yes  (07/25/2010)   Foot exam action/deferral: Do today   High risk foot: No  (07/25/2010)   Foot care education: Done  (07/25/2010)   Foot exam due: 08/02/2011    Urine microalbumin/creatinine ratio: Not documented    Diabetes flowsheet reviewed?: Yes   Progress toward A1C goal: Improved    Stage of readiness to change (diabetes management): Action  Lipids   Total Cholesterol: 137  (02/07/2010)   LDL: 57  (02/07/2010)   LDL Direct: Not documented   HDL: 58  (02/07/2010)   Triglycerides: 109  (02/07/2010)   Lipid  panel due: 03/10/2010     SGOT (AST): 17  (02/07/2010)   SGPT (ALT): 16  (02/07/2010)   Alkaline phosphatase: 52  (02/07/2010)   Total bilirubin: 0.5  (02/07/2010)   Liver panel due: 03/10/2010    Lipid flowsheet reviewed?: Yes   Progress toward LDL goal: At goal    Stage of readiness to change (lipid management): Maintenance  Hypertension   Last Blood Pressure: 163 / 86  (07/25/2010)   Serum creatinine: 1.18  (02/07/2010)   Serum potassium 3.7  (02/07/2010)    Hypertension flowsheet reviewed?: Yes   Progress toward BP goal: Deteriorated    Stage of readiness to change (hypertension management): Action  Self-Management Support :   Personal Goals (by the next clinic visit) :     Personal A1C goal: 7  (03/25/2009)     Personal blood pressure goal: 130/80  (02/20/2010)     Personal LDL goal: 70  (02/20/2010)    Diabetes self-management support: Copy of home glucose meter record, CBG self-monitoring log  (05/20/2009)    Hypertension self-management support: Written self-care plan, Education handout  (04/28/2009)    Lipid self-management support: Not documented    Nursing Instructions: Diabetic foot exam today

## 2010-08-28 ENCOUNTER — Encounter: Payer: Self-pay | Admitting: Family Medicine

## 2010-08-28 ENCOUNTER — Ambulatory Visit (INDEPENDENT_AMBULATORY_CARE_PROVIDER_SITE_OTHER): Payer: Medicare PPO | Admitting: Family Medicine

## 2010-08-28 VITALS — BP 138/78 | HR 82 | Temp 98.7°F | Ht 70.0 in | Wt 213.0 lb

## 2010-08-28 DIAGNOSIS — I10 Essential (primary) hypertension: Secondary | ICD-10-CM

## 2010-08-28 NOTE — Patient Instructions (Signed)
It was nice seeing you again today.  When I rechecked your blood pressure your number looked much healthier at 138/78.  I encourage you to continue your exercise and dietary changes.  Also you may want to cut back on your beer intake as this can affect your blood pressure some.  I would like to see you back in 2-3 months to follow up on your diabetes and to see how your blood pressure continues to do.  Have a nice day and remember to get out this afternoon and take a nice walk if the weather is nice enough.

## 2010-08-30 ENCOUNTER — Encounter: Payer: Self-pay | Admitting: Family Medicine

## 2010-08-30 NOTE — Progress Notes (Signed)
  Subjective:    Patient ID: Andrew [name], male    DOB: Jun 18, 1956, 55 y.o.   MRN: [id number]  Hypertension This is a chronic problem. Pertinent negatives include no anxiety, blurred vision, chest pain, headaches, malaise/fatigue, neck pain, orthopnea, palpitations, peripheral edema, PND, shortness of breath or sweats. Associated agents: Of note patient with moderate consumption of EtOH.  Drinking 2-24 oz cans of beer on averrage each day of the weekend.  Is trying to improve diet by reduicing sodiium intake and tryint to get more exercise. Risk factors for coronary artery disease include diabetes mellitus, dyslipidemia, male gender and family history. Past treatments include diuretics, calcium channel blockers and ACE inhibitors. The current treatment provides moderate improvement. There are no compliance problems.  Hypertensive end-organ damage includes kidney disease and CVA.      Review of Systems  Constitutional: Negative for malaise/fatigue.  HENT: Negative for neck pain.   Eyes: Negative for blurred vision.  Respiratory: Negative for shortness of breath.   Cardiovascular: Negative for chest pain, palpitations, orthopnea and PND.  Neurological: Negative for headaches.       Objective:   Physical Exam  Constitutional: He appears well-developed and well-nourished. No distress.  HENT:  Head: Normocephalic and atraumatic.  Cardiovascular: Normal rate and regular rhythm.   Pulmonary/Chest: Effort normal and breath sounds normal.  Musculoskeletal: He exhibits no edema.     Recheck of BP 138/78

## 2010-08-31 NOTE — Assessment & Plan Note (Addendum)
Recheck of BP showed much healthier number of 138/78, still slightly high systolic.  Will make no medication changes today, on max doses of three separate agents.  Patient agrees to continue with dietary changes and exercise.  F/u in 2-3  Months.

## 2010-10-23 ENCOUNTER — Telehealth: Payer: Self-pay | Admitting: Family Medicine

## 2010-10-23 NOTE — Telephone Encounter (Signed)
Needs refill on Simvastatin & Penicillin (for tooth abscess) [facility] - Phone: [phone]   Fax:[phone]  States that pharmacy has tried to contact doctor.  [phone]

## 2010-10-23 NOTE — Telephone Encounter (Signed)
Spoke with patient and advised that he will need to be seen in order to prescribe antibiotic. Advised will send message to MD about refilling simvastatin . Appointment scheduled for tomorrow AM for problem with tooth.Marland Kitchen

## 2010-10-24 ENCOUNTER — Ambulatory Visit: Payer: Medicare PPO | Admitting: Family Medicine

## 2010-10-24 NOTE — Telephone Encounter (Signed)
Have not received anything from pharmacy on this patient.  Will address at appointment.

## 2010-10-26 ENCOUNTER — Ambulatory Visit: Payer: Medicare PPO | Admitting: Family Medicine

## 2010-10-27 ENCOUNTER — Ambulatory Visit (INDEPENDENT_AMBULATORY_CARE_PROVIDER_SITE_OTHER): Payer: Medicare PPO | Admitting: Family Medicine

## 2010-10-27 ENCOUNTER — Encounter: Payer: Self-pay | Admitting: Family Medicine

## 2010-10-27 VITALS — BP 150/88 | HR 77 | Temp 99.1°F | Wt 204.0 lb

## 2010-10-27 DIAGNOSIS — E119 Type 2 diabetes mellitus without complications: Secondary | ICD-10-CM

## 2010-10-27 DIAGNOSIS — K047 Periapical abscess without sinus: Secondary | ICD-10-CM

## 2010-10-27 DIAGNOSIS — E785 Hyperlipidemia, unspecified: Secondary | ICD-10-CM

## 2010-10-27 DIAGNOSIS — R21 Rash and other nonspecific skin eruption: Secondary | ICD-10-CM

## 2010-10-27 MED ORDER — PENICILLIN V POTASSIUM 500 MG PO TABS: 500.0000 mg | ORAL_TABLET | Freq: Four times a day (QID) | ORAL | Status: DC

## 2010-10-27 MED ORDER — ATORVASTATIN CALCIUM 40 MG PO TABS: 40.0000 mg | ORAL_TABLET | Freq: Every day | ORAL | Status: DC

## 2010-10-27 MED ORDER — HYDROCODONE-ACETAMINOPHEN 5-500 MG PO TABS: 1.0000 | ORAL_TABLET | ORAL | Status: DC | PRN

## 2010-10-27 MED ORDER — TRIAMCINOLONE ACETONIDE 0.1 % EX CREA: TOPICAL_CREAM | Freq: Two times a day (BID) | CUTANEOUS | Status: DC

## 2010-10-27 NOTE — Patient Instructions (Signed)
Good seeing you today.  Keep up the good work with the weight loss and diabetes control.  I have sent over the prescriptions for you to your mail order pharmacy.  If the lipitor ends up being too expensive for some reason please call and we can change to something else.  I want you to come back in three months to check your cholesterol and A1C again.

## 2010-10-27 NOTE — Progress Notes (Signed)
  Subjective:    Patient ID: Andrew [name], male    DOB: 13-Nov-1955, 55 y.o.   MRN: [id number]  HPI Here for  1. Tooth pain:  Currently in the process of having teeth extracted, thinks he has another abscess.  Pain in L upper teeth.  Has noticed some bleeding and pus draining from area.  Using peridex daily.  Nothing for pain control currently.  Denies fever/chills, cough, difficulty swallowing, sinus pain. 2.  Rash on side:  Rash on Left ribs.  Comes and goes and is itchy.  Non painful Does not know of any contact.  Has not recenlty changed soaps/detergents.  Not worse with exposure to heat.   3.  Diabetes:  Checks blood sugars infrequently at home.  Feels like he does well with his diet and has been walking a lot more.  Has lost some weight since previous visit.   Denies vision changes, chest pain, sob, abdominal pain.     Review of Systems See HPI    Objective:   Physical Exam  Constitutional: He appears well-developed and well-nourished. No distress.  HENT:  Mouth/Throat: Oropharynx is clear and moist and mucous membranes are normal. Dental abscesses and dental caries present.       Abscess L upper premolar, pus expressed with cotton tip applicator.    Cardiovascular: Normal rate, regular rhythm and S1 normal.   Pulmonary/Chest: Effort normal and breath sounds normal.  Lymphadenopathy:       Head (right side): No submental, no submandibular and no tonsillar adenopathy present.       Head (left side): Submandibular adenopathy present. No submental and no tonsillar adenopathy present.  Skin: Rash noted. Rash is maculopapular.          Reddened, non warm without drainage or bleeding.            Assessment & Plan:

## 2010-10-31 NOTE — Assessment & Plan Note (Signed)
In the process of getting teeth extracted.  Dental abscess in upper premolar.  Will treat with Penicillin x10 days.  Vicodin given for pain control.

## 2010-10-31 NOTE — Assessment & Plan Note (Signed)
Changed simvastatin to lipitor due to possible interaction with amlodipine.  Will follow up lipid profile in three months on new medication.

## 2010-10-31 NOTE — Assessment & Plan Note (Signed)
Doing well, A1C improved from 6.5 to 6.3, no medication changes.  Continue current lifestyle changes.  Follow up in three months.

## 2010-10-31 NOTE — Assessment & Plan Note (Signed)
Rash looks to be contact dermatitis, will treat with triamcinolone.  Unsure of offending agent.  Told him to pay attention if he does anything different to affected area.  If not improving with cream, may be caused by sarcoid.

## 2010-11-02 ENCOUNTER — Telehealth: Payer: Self-pay | Admitting: Family Medicine

## 2010-11-02 DIAGNOSIS — K047 Periapical abscess without sinus: Secondary | ICD-10-CM

## 2010-11-02 DIAGNOSIS — E785 Hyperlipidemia, unspecified: Secondary | ICD-10-CM

## 2010-11-02 MED ORDER — PENICILLIN V POTASSIUM 500 MG PO TABS: 500.0000 mg | ORAL_TABLET | Freq: Four times a day (QID) | ORAL | Status: AC

## 2010-11-02 MED ORDER — PRAVASTATIN SODIUM 80 MG PO TABS: 80.0000 mg | ORAL_TABLET | Freq: Every day | ORAL | Status: DC

## 2010-11-02 NOTE — Telephone Encounter (Signed)
Forward to [place] 

## 2010-11-02 NOTE — Telephone Encounter (Signed)
Patient calling re: penicillin medication that was prescribed, pharmacy says we sent 2 different ones & they need clarification on which one pt needs. Pt also needs an alternate Rx for lipitor b/c its too expensive, pt said he cant take Simvastatin b/c it will effect his enalapril. Please call pt back once everything is taken care of. Pt seen on 10/27/10.

## 2010-11-02 NOTE — Telephone Encounter (Signed)
Re-sent rx for penicillin and changed statin to pravastatin.  Please call patient to let him know this has been changed.

## 2010-11-03 NOTE — Telephone Encounter (Signed)
Called and informed patient of Rx sent to pharmacy.Andrew [name]  

## 2010-11-24 NOTE — H&P (Signed)
NAME:  Andrew [name], Andrew [name]                          ACCOUNT NO.:  [id number]   MEDICAL RECORD NO.:  [id number]                   PATIENT TYPE:  EMS   LOCATION:  MINO                                 FACILITY:  [facility]   PHYSICIAN:  Michael L. Thad Ranger, M.D.           DATE OF BIRTH:  July 23, 1955   DATE OF ADMISSION:  03/15/2004  DATE OF DISCHARGE:                                HISTORY & PHYSICAL   CHIEF COMPLAINT:  Increased right-sided weakness.   HISTORY OF PRESENT ILLNESS:  This is the second [facility]  admission for this 55 year old man with past medical history which includes  hypertension and previous stroke resulting in some right-sided weakness,  after which he largely recovered.  The patient reports noticing increasing  weakness and numbness on the right side over his baseline about 10 days ago  with some associated increased slurred speech.  This became acutely worse  still and lasted a day or two and he came to the emergency room today for  further evaluation of this.  He reports having a mild headache on and off,  as well as some shortness of breath, but denies any associated chest pain.  He says his symptoms are similar to his previous stroke.   PAST MEDICAL HISTORY:  Remarkable for hypertension which is uncontrolled.  He has been off medications for about six months for this.  He also reports  history of borderline diabetes.  He had a previous stroke in 1996 with  resultant right-sided weakness.   FAMILY HISTORY:  Remarkable for stroke.   SOCIAL HISTORY:  He does not smoke.  He is not presently employed.   ALLERGIES:  No known drug allergies.   MEDICATIONS:  None.  He was on Procardia, but has been off of this for over  six months.   REVIEW OF SYSTEMS:  Reports no blurred vision, but denies diplopia.  He has  had some nausea and vomited one time yesterday.  Review of systems is  otherwise negative except for the HPI and admission ER nursing records.   PHYSICAL  EXAMINATION:  VITAL SIGNS:  Temperature 98.0, blood pressure  200/120, pulse 75, respirations 24.  GENERAL:  He is alert, seated on the gurney in no acute distress.  HEENT:  Cranium is normocephalic and atraumatic.  Oropharynx is benign.  NECK:  Supple without carotid bruits.  HEART:  Regular rate and rhythm.  CHEST: Clear to auscultation bilaterally.  EXTREMITIES:  2+ pulses, no edema.  NEUROLOGY:  Mental status; he is awake and alert.  He is fully oriented.  He  is able to follow multistep commands.  Speech is mentally dysarthric.  Cranial nerves; funduscopic examination reveals chronic hypertensive  changes.  Pupils equally reactive.  Extraocular movements are full.  Visual  fields are full to confrontation. Face, tongue, and palate move normally  symmetrically.  Facial sensation is intact to pinprick.  Motor; normal bulk  and  tone.  There is mild weakness of the intrinsic muscles of the right  hand, otherwise strength is intact.  Sensation; diminished pinprick  sensation over both feet to the mid calf, otherwise intact.  Reflexes are  generally diminished.  Finger-to-nose is performed adequately.  He is able  to ambulate, but has trouble toe and tandem walking.   LABORATORY DATA:  CBC; white count 5.7, hemoglobin 13.6, platelets 166,000.  CMET is remarkable only for an elevated glucose of 198 as well as mildly low  sodium of 133 and potassium of 3.0.  Coags were normal.  CT of the head is  personally reviewed and demonstrates old strokes in the left pons and the  left thalamus.   IMPRESSION:  Probable subacute stroke and increased right-sided weakness.  He has several uncontrolled risk factors including hypertension and new  onset diabetes.   PLAN:  Will admit for stroke workup including MRI, MRA, carotid and  transcranial Dopplers, two-dimensional echocardiogram, and laboratory  workup.  We will ask Family Practice Teaching Service to evaluate him as he  will need follow-up with  them for longterm control of his risk factors.  Stroke service to follow.  He will be placed on aspirin for now.                                                Michael L. Thad Ranger, M.D.    MLR/MEDQ  D:  03/15/2004  T:  03/15/2004  Job:  [id number]

## 2010-11-24 NOTE — Discharge Summary (Signed)
NAME:  Andrew, [name]                          ACCOUNT NO.:  [id number]   MEDICAL RECORD NO.:  [id number]                   PATIENT TYPE:  INP   LOCATION:  3039                                 FACILITY:  [facility]   PHYSICIAN:  Pramod P. Pearlean Brownie, MD                 DATE OF BIRTH:  02-Apr-1956   DATE OF ADMISSION:  03/15/2004  DATE OF DISCHARGE:  03/16/2004                                 DISCHARGE SUMMARY   DISCHARGE DIAGNOSES:  1.  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  2.  Uncontrolled hypertension.  3.  Newly diagnosed diabetes.  4.  History of old stroke.  5.  Hypokalemia.   DISCHARGE MEDICATIONS:  1.  Aspirin 325 mg a day.  2.  Hydrochlorothiazide 50 mg a day.  3.  Potassium 20 mEq a day.   STUDIES PERFORMED:  1.  CT of the brain on admission shows an old left pontine and thalamic      infarct.  2.  MRI of the brain shows no acute infarct, subacute left pontine and right      parietal infarct.  3.  MRA no acute abnormality per Dr. Pearlean Brownie, formal reading pending.  4.  Carotid Doppler shows  40 to 60% ICA stenosis on the right, may be      secondary to tortuosity of vessels; left no ICA stenosis and bilateral      vertebral artery flow antegrade.  5.  A 2-D echocardiogram  shows ejection fraction 60%, inadequate __________      LV function, hypokinesis and inferior and posterior wall of the right      ventricle, no obvious embolic source.  6.  Telemetry showed normal sinus rhythm.  7.  Transcranial Doppler was performed, results pending.   LABORATORY DATA:  CBC normal.  Coagulation studies normal.  Activated  partial thromboplastin time normal.  Chemistry with sodium initially 133  then up to 136, potassium initially 3 down to 2.9, chloride 101, CO2 31, BUN  5, creatinine 1.0, and glucose 194.  Albumin low at 3.4.  Liver function  tests normal.  UA with 250 mg of glucose and 30 g of protein.   HISTORY OF PRESENT ILLNESS:  [name] is a 55 year old right-handed  black male with a history of hypertension and previous stroke resulting in  some right-sided weakness after which he largely recovered.  The patient  reports noticing increasing weakness and numbness on the right side over his  baseline starting about two weeks ago with some associated increase in  slurred speech.  This became acutely worse still the last couple of days and  he came to the emergency room today for further evaluation.  He reports on  and off headache as well as some shortness of breath but denies chest pain.  He reports his symptoms are similar to  that of his previous stroke.  He was  not a SAINT II or TPA candidate.  He was admitted for further work-up.   HOSPITAL COURSE:  MRI did not reveal an acute infarct, though did show some  subacute infarcts which may be related to his right-sided symptoms.  The  patient also had high blood pressure 200s/110s to 120s on admission.  He has  history of hypertension but has not been taking his medication due to loss  of job and inability to afford medication.  He has not been on aspirin  products  through admission.  He has had homocystine, lipoid profile and  hemoglobin A1C drawn this admission.  Those results remain pending at the  time of discharge.  With elevated glucoses and reports of patient being a  borderline diabetic referral was made to outpatient nutrition management  center.  Diabetes nurse saw him in the hospital and gave him education.  Will send him home on appropriate low carb diabetic diet and will follow up  with him in an outpatient setting.  The patient also has previously been  seen by family practice teaching service here at [facility] and will have set up appointment with them for follow-up of his  medical problems.  For secondary stroke prevention, the patient needs stroke  risk factor control.  He is aware of this and agreeable.  Will give him   medications at time of discharge to keep him until he gets to the [facility].  He has a new job that should start next week.   CONDITION ON DISCHARGE:  The patient alert and oriented x3.  Speech clear.  No aphagia.  Visual fields full.  Asymmetric.  Strength is normal.  There is  no arm drift.  Gait is heavy.  Tandem gait is unsteady.  He does have some  decreased sensation on his right side.  His chest is clear to auscultation  and his heart rate is regular.   PLAN:  1.  Discharge home.  2.  Review blood pressure medications as well as treatment of hypokalemia.      The patient given prescription as well as drugs at discharge.  3.  Follow-up with [facility] in a few weeks.  Will need      indigent help with drug obtainment.  Consider statin depending on      cholesterol results, ACE if he can afford and __________ if it is needed      for elevated homocystine.  Follow up with family practice on March 29, 2004.  Follow up with Dr. Pearlean Brownie in two to three months.  Call for an      appointment.  Follow up transcranial Doppler.  Also follow up official      reading of the MRI.      Andrew [name], N.P.                         Pramod P. Pearlean Brownie, MD    SB/MEDQ  D:  03/16/2004  T:  03/17/2004  Job:  [id number]   cc:   [facility]

## 2011-02-09 ENCOUNTER — Other Ambulatory Visit: Payer: Self-pay | Admitting: Family Medicine

## 2011-02-12 NOTE — Telephone Encounter (Signed)
Refill request

## 2011-02-13 ENCOUNTER — Other Ambulatory Visit: Payer: Self-pay | Admitting: Family Medicine

## 2011-02-13 NOTE — Telephone Encounter (Signed)
[facility] has been trying to contact Dr. Ashley Royalty for refills on Metformin, HCTZ, Enalapril, Amlodipine, Pravastatin, Chlorhexidine Gluconate and Triamcinolone Acetonide Cream and has not heard anything back.  They have attempted twice, but still no response.  Phone/Fax - [phone].  Is not out yet, but wants the refills soon so he doesn't run out.

## 2011-02-14 NOTE — Telephone Encounter (Signed)
Completed.

## 2011-03-08 ENCOUNTER — Ambulatory Visit (INDEPENDENT_AMBULATORY_CARE_PROVIDER_SITE_OTHER): Payer: Medicare PPO | Admitting: Family Medicine

## 2011-03-08 ENCOUNTER — Encounter: Payer: Self-pay | Admitting: Family Medicine

## 2011-03-08 VITALS — BP 148/84 | HR 67 | Wt 207.5 lb

## 2011-03-08 DIAGNOSIS — I10 Essential (primary) hypertension: Secondary | ICD-10-CM

## 2011-03-08 DIAGNOSIS — L731 Pseudofolliculitis barbae: Secondary | ICD-10-CM

## 2011-03-08 DIAGNOSIS — D1779 Benign lipomatous neoplasm of other sites: Secondary | ICD-10-CM

## 2011-03-08 DIAGNOSIS — E785 Hyperlipidemia, unspecified: Secondary | ICD-10-CM

## 2011-03-08 DIAGNOSIS — K047 Periapical abscess without sinus: Secondary | ICD-10-CM

## 2011-03-08 DIAGNOSIS — D171 Benign lipomatous neoplasm of skin and subcutaneous tissue of trunk: Secondary | ICD-10-CM

## 2011-03-08 DIAGNOSIS — E119 Type 2 diabetes mellitus without complications: Secondary | ICD-10-CM

## 2011-03-08 DIAGNOSIS — L738 Other specified follicular disorders: Secondary | ICD-10-CM

## 2011-03-08 LAB — COMPREHENSIVE METABOLIC PANEL
ALT: 11 U/L (ref 0–53)
AST: 16 U/L (ref 0–37)
CO2: 27 mEq/L (ref 19–32)
Chloride: 102 mEq/L (ref 96–112)
Sodium: 136 mEq/L (ref 135–145)
Total Bilirubin: 0.4 mg/dL (ref 0.3–1.2)
Total Protein: 7.6 g/dL (ref 6.0–8.3)

## 2011-03-08 LAB — CBC
MCHC: 32.3 g/dL (ref 30.0–36.0)
MCV: 93.5 fL (ref 78.0–100.0)
Platelets: 221 10*3/uL (ref 150–400)
RDW: 13.3 % (ref 11.5–15.5)
WBC: 5.2 10*3/uL (ref 4.0–10.5)

## 2011-03-08 MED ORDER — CLINDAMYCIN PHOS-BENZOYL PEROX 1-5 % EX GEL: Freq: Two times a day (BID) | CUTANEOUS | Status: DC

## 2011-03-08 NOTE — Patient Instructions (Signed)
It was good seeing you today.  I will let you know about your labs when i receive the results.  I would like for you to return in three months or sooner as needed.

## 2011-03-18 DIAGNOSIS — D171 Benign lipomatous neoplasm of skin and subcutaneous tissue of trunk: Secondary | ICD-10-CM | POA: Insufficient documentation

## 2011-03-18 HISTORY — DX: Benign lipomatous neoplasm of skin and subcutaneous tissue of trunk: D17.1

## 2011-03-18 MED ORDER — TRAZODONE HCL 100 MG PO TABS: 100.0000 mg | ORAL_TABLET | Freq: Every day | ORAL | Status: DC

## 2011-03-18 MED ORDER — METFORMIN HCL 500 MG PO TABS: ORAL_TABLET | ORAL | Status: DC

## 2011-03-18 MED ORDER — AMLODIPINE BESYLATE 10 MG PO TABS: 10.0000 mg | ORAL_TABLET | Freq: Every day | ORAL | Status: DC

## 2011-03-18 MED ORDER — LOVASTATIN 40 MG PO TABS: 40.0000 mg | ORAL_TABLET | Freq: Every day | ORAL | Status: DC

## 2011-03-18 MED ORDER — ENALAPRIL MALEATE 20 MG PO TABS: 40.0000 mg | ORAL_TABLET | Freq: Every day | ORAL | Status: DC

## 2011-03-18 MED ORDER — TRIAMTERENE-HCTZ 37.5-25 MG PO TABS: 1.0000 | ORAL_TABLET | Freq: Every day | ORAL | Status: DC

## 2011-03-18 MED ORDER — CHLORHEXIDINE GLUCONATE 0.12 % MT SOLN: 15.0000 mL | Freq: Two times a day (BID) | OROMUCOSAL | Status: DC

## 2011-03-18 NOTE — Assessment & Plan Note (Signed)
No current abscess, explained to him that I can't prescribe him meds to have on hand, especially pain meds.  Need to be seen if he thinks he has an abscess.  Continue peridex

## 2011-03-18 NOTE — Assessment & Plan Note (Signed)
Well controlled. Continue metformin.

## 2011-03-18 NOTE — Assessment & Plan Note (Signed)
Explained to him rash likely not caused by his pravastatin, but does not wish to continue.  Will give benzoyl peroxide-clindamycin to try.

## 2011-03-18 NOTE — Assessment & Plan Note (Addendum)
Well controlled, continue current medications.  Meds refilled, F/u in 6 months

## 2011-03-18 NOTE — Assessment & Plan Note (Signed)
Does not wish to continue pravastatin.  Will change to lovastatin 2/2 to cost issues and med interactions.  Check D-LDL today.

## 2011-03-18 NOTE — Progress Notes (Signed)
  Subjective:    Patient ID: Andrew [name], male    DOB: 20-Feb-1956, 55 y.o.   MRN: [id number]  HPI 1. Hypertension:  Is taking all medication prescribed to him.  Is trying to walk more. Avoiding salt as much as possible. No other current changes.  BP well controlled today.  2. Lump on back:  Has noticed lump on his upper back that he thinks has grown in size.  Has been there for a "long time."  Area is non tender, only bothers him cosmetically.  3. Hyperlipidemia/Rash on face:  Has quit taking pravastatin because he thinks it is causing his face to break out.  Rash on face recently started occurring, looks like acne, worse after shaving.    Otherwise no side effects.  Could not afford lipitor and not on simvastatin 2/2 to amlodipine use. 4. Recurrent gingivitis and tooth abscess:  Patient has been in the process of getting teeth extracted.  No abscess recently however requesting refill on antibiotics and pain medicine to have on hand in case he were to get another infection.  Is using peridex daily.    Review of Systems     Objective:   Physical Exam  Constitutional: He appears well-developed and well-nourished.  HENT:  Mouth/Throat: Abnormal dentition. Dental caries present. No dental abscesses.  Cardiovascular: Normal rate, regular rhythm and normal heart sounds.   Pulmonary/Chest: Effort normal and breath sounds normal.  Musculoskeletal:       Back:  Skin:       Area on chin with small pustules.          Assessment & Plan:

## 2011-03-18 NOTE — Assessment & Plan Note (Signed)
Large area on back consistent with lipoma.  Pt. Would like to have this removed but I think this is too large to have removed in our clinic.  Will refer to gen. Surgery.

## 2011-03-20 ENCOUNTER — Telehealth: Payer: Self-pay | Admitting: *Deleted

## 2011-03-20 NOTE — Telephone Encounter (Signed)
Attempted to call patient, number was busy. He has appointment with central  surgery on 03/29/2011 at 8:40 am. Will attempt to call back again.Lynnetta Tom, Rodena Medin

## 2011-03-21 NOTE — Telephone Encounter (Signed)
Attempted to call patient again, number busy.Andrew [name], Andrew [name]

## 2011-03-22 NOTE — Telephone Encounter (Signed)
Called pt again. Number busy. Unable to reach pt. Will mail letter with appt .Arlyss Repress

## 2011-03-23 ENCOUNTER — Encounter: Payer: Self-pay | Admitting: *Deleted

## 2011-03-26 ENCOUNTER — Encounter: Payer: Self-pay | Admitting: Family Medicine

## 2011-03-29 ENCOUNTER — Ambulatory Visit (INDEPENDENT_AMBULATORY_CARE_PROVIDER_SITE_OTHER): Payer: Medicare PPO | Admitting: General Surgery

## 2011-04-30 ENCOUNTER — Ambulatory Visit (INDEPENDENT_AMBULATORY_CARE_PROVIDER_SITE_OTHER): Payer: Medicare PPO | Admitting: Family Medicine

## 2011-04-30 ENCOUNTER — Encounter: Payer: Self-pay | Admitting: Family Medicine

## 2011-04-30 VITALS — BP 155/86 | HR 54 | Temp 98.2°F | Ht 70.0 in | Wt 214.0 lb

## 2011-04-30 DIAGNOSIS — D1779 Benign lipomatous neoplasm of other sites: Secondary | ICD-10-CM

## 2011-04-30 DIAGNOSIS — M2550 Pain in unspecified joint: Secondary | ICD-10-CM

## 2011-04-30 DIAGNOSIS — D171 Benign lipomatous neoplasm of skin and subcutaneous tissue of trunk: Secondary | ICD-10-CM

## 2011-04-30 DIAGNOSIS — E785 Hyperlipidemia, unspecified: Secondary | ICD-10-CM

## 2011-04-30 DIAGNOSIS — Z23 Encounter for immunization: Secondary | ICD-10-CM

## 2011-04-30 MED ORDER — TRAMADOL HCL 50 MG PO TABS: 50.0000 mg | ORAL_TABLET | Freq: Three times a day (TID) | ORAL | Status: DC | PRN

## 2011-05-02 ENCOUNTER — Encounter: Payer: Self-pay | Admitting: Family Medicine

## 2011-05-03 ENCOUNTER — Encounter (INDEPENDENT_AMBULATORY_CARE_PROVIDER_SITE_OTHER): Payer: Self-pay | Admitting: General Surgery

## 2011-05-03 ENCOUNTER — Ambulatory Visit (INDEPENDENT_AMBULATORY_CARE_PROVIDER_SITE_OTHER): Payer: Medicare PPO | Admitting: General Surgery

## 2011-05-03 VITALS — BP 140/90 | HR 64 | Temp 96.9°F | Resp 14 | Ht 73.0 in | Wt 210.0 lb

## 2011-05-03 DIAGNOSIS — D171 Benign lipomatous neoplasm of skin and subcutaneous tissue of trunk: Secondary | ICD-10-CM

## 2011-05-03 DIAGNOSIS — D1779 Benign lipomatous neoplasm of other sites: Secondary | ICD-10-CM

## 2011-05-03 NOTE — Progress Notes (Signed)
Chief Complaint  Patient presents with  . Other    Eval of lipoma on upper back.    HPI Andrew [name] is a 55 y.o. male.  Referred by Dr. Everrett Coombe HPI This is a 55 year old male with a significant history of diabetes, hypertension, strokes. He presents with about a 2 year history of a lump on his upper back that is increased in size over that time. It is begun causing him some discomfort at this time. Has no history of infection. Nothing really relieves the discomfort. It is aggravated by doing some movements or wearing his clothes. He comes in today requesting to have this removed.Past Medical History  Diagnosis Date  . CRD (chronic renal disease), stage II     Baseline Cr 1.2  . Prostatitis     hx of x2  . HTN (hypertension)   . HLD (hyperlipidemia)   . T2DM (type 2 diabetes mellitus)   . 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 Sarcoidosis   . Arthritis   . Leg swelling     Past Surgical History  Procedure Date  . Transthoracic echocardiogram 2005    EF 60%  . Ercp 2006    Normal  . Hernia repair 1985, 1995    x2    Family History  Problem Relation Age of Onset  . Bone cancer Father   . Cancer Father 43    bone  . Uterine cancer Mother   . Heart attack Brother     Social History History  Substance Use Topics  . Smoking status: Never Smoker   . Smokeless tobacco: Never Used  . Alcohol Use: Yes     beer on the weekend    Allergies  Allergen Reactions  . Metoprolol Succinate     REACTION: bradycardia (HR to 42)  . Penicillins     itching    Current Outpatient Prescriptions  Medication Sig Dispense Refill  . pravastatin (PRAVACHOL) 80 MG tablet       . amLODipine (NORVASC) 10 MG tablet Take 1 tablet (10 mg total) by mouth daily.  30 tablet  5  . aspirin (BAYER CHILDRENS ASPIRIN) 81 MG chewable tablet Chew 81 mg by mouth daily.        . chlorhexidine (PERIDEX) 0.12 % solution Use as directed 15 mLs in  the mouth or throat 2 (two) times daily.  480 mL  3  . clindamycin-benzoyl peroxide (BENZACLIN) gel Apply topically 2 (two) times daily.  25 g  1  . enalapril (VASOTEC) 20 MG tablet Take 2 tablets (40 mg total) by mouth daily.  60 tablet  5  . lovastatin (MEVACOR) 40 MG tablet Take 1 tablet (40 mg total) by mouth at bedtime.  30 tablet  5  . metFORMIN (GLUCOPHAGE) 500 MG tablet Take one tab in the morning and 2 tabs in the evening  90 tablet  5  . traMADol (ULTRAM) 50 MG tablet Take 1-2 tablets (50-100 mg total) by mouth every 8 (eight) hours as needed for pain.  90 tablet  1  . traZODone (DESYREL) 100 MG tablet Take 1 tablet (100 mg total) by mouth at bedtime.  30 tablet  5  . triamcinolone (KENALOG) 0.1 % cream APPLY TOPICALLY TWICE DAILY  45 g  0  . triamterene-hydrochlorothiazide (MAXZIDE-25) 37.5-25 MG per tablet Take 1 each (1 tablet total) by mouth daily.  30 tablet  5  Review of Systems Review of Systems  Constitutional: Negative for fever, chills and unexpected weight change.  HENT: Negative for hearing loss, congestion, sore throat, trouble swallowing and voice change.   Eyes: Negative for visual disturbance.  Respiratory: Negative for cough and wheezing.   Cardiovascular: Positive for leg swelling. Negative for chest pain and palpitations.  Gastrointestinal: Negative for nausea, vomiting, abdominal pain, diarrhea, constipation, blood in stool, abdominal distention, anal bleeding and rectal pain.  Genitourinary: Negative for hematuria and difficulty urinating.  Musculoskeletal: Negative for arthralgias.  Skin: Negative for rash and wound.  Neurological: Negative for seizures, syncope, weakness and headaches.  Hematological: Negative for adenopathy. Does not bruise/bleed easily.  Psychiatric/Behavioral: Negative for confusion.    Blood pressure 140/90, pulse 64, temperature 96.9 F (36.1 C), temperature source Temporal, resp. rate 14, height 6\' 1"  (1.854 m), weight 210 lb  (95.255 kg).  Physical Exam Physical Exam  Constitutional: He appears well-developed and well-nourished.  Eyes: No scleral icterus.  Neck: Neck supple.  Cardiovascular: Normal rate, regular rhythm and normal heart sounds.   Pulmonary/Chest: Effort normal and breath sounds normal. He has no wheezes. He has no rales.    Abdominal: Soft. There is no tenderness.  Lymphadenopathy:    He has no cervical adenopathy.     Assessment    Upper back lipoma    Plan       This appears to be a benign lipoma but it is causing him some symptoms. I gave him the option of observation he would very much like to have this area excised. We discussed excision under general as I don't think I can remove this in the office her just under local. We discussed possibility of a drain postoperatively we discussed the risks including medical issues like strokes, bleeding, infection, seroma. I would obtain clearance from his primary care physician first. I'm going to keep him on his aspirin qsurgery as well.    Andrew [name] 05/03/2011, 9:53 AM

## 2011-05-06 DIAGNOSIS — M2550 Pain in unspecified joint: Secondary | ICD-10-CM | POA: Insufficient documentation

## 2011-05-06 NOTE — Progress Notes (Signed)
  Subjective:    Patient ID: Andrew [name], male    DOB: 1956-07-08, 55 y.o.   MRN: [id number]  HPI 1. Joint Pain:  Comes in today with complaint of pain in his joints.  Has had this pain in the past and has rcently started getting worse again.  This is his typical pattern that when the weather starts to get cooler he begins to have more joint pain.  Has noticed that knee swells sometimes.  Pain mostly located in shoulders, low back and knees.  Has not tried anything for pain control.  Denies that this feels like pain in his muscles and denies weakness.   He denies any recent illness.  2. Lipoma on back:  Still bothering him, has not noticed that this has changed in size since his last visity.  He is scheduled to see surgeon in the next week, to discuss removal of this area.    Review of Systems     Objective:   Physical Exam  Constitutional: He appears well-developed and well-nourished. No distress.  HENT:  Head: Normocephalic.  Neck: Neck supple. No thyromegaly present.  Musculoskeletal: Normal range of motion. He exhibits tenderness.       Increased pain with ROM testing in knees and shoulder. Tenderness along knee joint line bilaterally.  No signs of impingement in shoulders.  No tenderness to palpation of muscles. No swelling seen.           Assessment & Plan:

## 2011-05-06 NOTE — Assessment & Plan Note (Signed)
To meet with generally surgery next week to discuss removal.

## 2011-05-06 NOTE — Assessment & Plan Note (Signed)
Moderate polyarticular joint pain.  Likely 2/2 to OA.  Given renal insufficiency, I don't think that and NSAID would be a good idea for him.  I will give him a trial of tramadol to see if this helps.  Given red flags.

## 2011-05-11 ENCOUNTER — Telehealth: Payer: Self-pay | Admitting: *Deleted

## 2011-05-11 ENCOUNTER — Telehealth (INDEPENDENT_AMBULATORY_CARE_PROVIDER_SITE_OTHER): Payer: Self-pay

## 2011-05-11 NOTE — Telephone Encounter (Signed)
Call received on [facility] voicemail  from Dr. Doreen Salvage office stating they have not received the Medical Clearance information they requested  on Oct 24 for lipoma removal. Will forward to Dr. Ashley Royalty.  Call back # [phone]

## 2011-05-11 NOTE — Telephone Encounter (Signed)
LMOM requesting the status of the medical clearance for surgery trying to get scheduled.Andrew [name]

## 2011-05-13 ENCOUNTER — Encounter: Payer: Self-pay | Admitting: Family Medicine

## 2011-05-13 NOTE — Telephone Encounter (Signed)
Have not seen a request for clearance from [facility].  I do see the note where he was seen on 10/25 at [facility] though.  I will write a letter for clearanceand send to admin to have this faxed over to them.

## 2011-05-14 NOTE — Telephone Encounter (Signed)
Letter for medical clearance faxed to [facility].

## 2011-05-18 ENCOUNTER — Telehealth (INDEPENDENT_AMBULATORY_CARE_PROVIDER_SITE_OTHER): Payer: Self-pay

## 2011-05-18 NOTE — Telephone Encounter (Signed)
Called pt to notify him that we did receive his cardiac clearance from Dr Ashley Royalty. I advised pt that Dr Dwain Sarna would be in the office on Monday 05-21-11 and we would get his surgical orders in epic next wk then give him a call to schedule surgery.Hulda Humphrey

## 2011-05-21 ENCOUNTER — Other Ambulatory Visit (INDEPENDENT_AMBULATORY_CARE_PROVIDER_SITE_OTHER): Payer: Self-pay | Admitting: General Surgery

## 2011-06-20 ENCOUNTER — Encounter (HOSPITAL_BASED_OUTPATIENT_CLINIC_OR_DEPARTMENT_OTHER): Payer: Self-pay | Admitting: *Deleted

## 2011-06-20 NOTE — Progress Notes (Signed)
To come in for labs,ekg

## 2011-06-21 ENCOUNTER — Encounter (HOSPITAL_BASED_OUTPATIENT_CLINIC_OR_DEPARTMENT_OTHER)
Admission: RE | Admit: 2011-06-21 | Discharge: 2011-06-21 | Disposition: A | Discharge status: Home / Self Care | Payer: Medicare PPO | Source: Ambulatory Visit | Attending: General Surgery | Admitting: General Surgery

## 2011-06-21 ENCOUNTER — Other Ambulatory Visit: Payer: Self-pay

## 2011-06-21 LAB — CBC
Hemoglobin: 13.8 g/dL (ref 13.0–17.0)
MCH: 31.1 pg (ref 26.0–34.0)
Platelets: 208 10*3/uL (ref 150–400)
RBC: 4.44 MIL/uL (ref 4.22–5.81)
WBC: 6 10*3/uL (ref 4.0–10.5)

## 2011-06-21 LAB — BASIC METABOLIC PANEL
Calcium: 9.3 mg/dL (ref 8.4–10.5)
GFR calc non Af Amer: 62 mL/min — ABNORMAL LOW (ref >90)
Glucose, Bld: 116 mg/dL — ABNORMAL HIGH (ref 70–99)
Potassium: 3.9 mEq/L (ref 3.5–5.1)
Sodium: 138 mEq/L (ref 135–145)

## 2011-06-22 ENCOUNTER — Encounter (HOSPITAL_BASED_OUTPATIENT_CLINIC_OR_DEPARTMENT_OTHER): Payer: Self-pay | Admitting: *Deleted

## 2011-06-22 ENCOUNTER — Other Ambulatory Visit (INDEPENDENT_AMBULATORY_CARE_PROVIDER_SITE_OTHER): Payer: Self-pay | Admitting: General Surgery

## 2011-06-22 ENCOUNTER — Ambulatory Visit (HOSPITAL_BASED_OUTPATIENT_CLINIC_OR_DEPARTMENT_OTHER): Payer: Medicare PPO | Admitting: Certified Registered Nurse Anesthetist

## 2011-06-22 ENCOUNTER — Encounter (HOSPITAL_BASED_OUTPATIENT_CLINIC_OR_DEPARTMENT_OTHER)
Admission: RE | Disposition: A | Discharge status: Home / Self Care | Payer: Self-pay | Source: Ambulatory Visit | Attending: General Surgery

## 2011-06-22 ENCOUNTER — Encounter (HOSPITAL_BASED_OUTPATIENT_CLINIC_OR_DEPARTMENT_OTHER): Payer: Self-pay | Admitting: Certified Registered Nurse Anesthetist

## 2011-06-22 ENCOUNTER — Ambulatory Visit (HOSPITAL_BASED_OUTPATIENT_CLINIC_OR_DEPARTMENT_OTHER)
Admission: RE | Admit: 2011-06-22 | Discharge: 2011-06-22 | Disposition: A | Discharge status: Home / Self Care | Payer: Medicare PPO | Source: Ambulatory Visit | Attending: General Surgery | Admitting: General Surgery

## 2011-06-22 DIAGNOSIS — I10 Essential (primary) hypertension: Secondary | ICD-10-CM | POA: Insufficient documentation

## 2011-06-22 DIAGNOSIS — Z8673 Personal history of transient ischemic attack (TIA), and cerebral infarction without residual deficits: Secondary | ICD-10-CM | POA: Insufficient documentation

## 2011-06-22 DIAGNOSIS — D1739 Benign lipomatous neoplasm of skin and subcutaneous tissue of other sites: Secondary | ICD-10-CM

## 2011-06-22 DIAGNOSIS — E119 Type 2 diabetes mellitus without complications: Secondary | ICD-10-CM | POA: Insufficient documentation

## 2011-06-22 DIAGNOSIS — Z01812 Encounter for preprocedural laboratory examination: Secondary | ICD-10-CM | POA: Insufficient documentation

## 2011-06-22 HISTORY — PX: LIPOMA EXCISION: SHX5283

## 2011-06-22 LAB — GLUCOSE, CAPILLARY
Glucose-Capillary: 133 mg/dL — ABNORMAL HIGH (ref 70–99)
Glucose-Capillary: 141 mg/dL — ABNORMAL HIGH (ref 70–99)

## 2011-06-22 SURGERY — EXCISION LIPOMA
Anesthesia: General | Site: Back | Wound class: Clean

## 2011-06-22 MED ORDER — ONDANSETRON HCL 4 MG/2ML IJ SOLN: 4.0000 mg | Freq: Once | INTRAMUSCULAR | Status: DC | PRN

## 2011-06-22 MED ORDER — MEPERIDINE HCL 25 MG/ML IJ SOLN: 6.2500 mg | INTRAMUSCULAR | Status: DC | PRN

## 2011-06-22 MED ORDER — FENTANYL CITRATE 0.05 MG/ML IJ SOLN
INTRAMUSCULAR | Status: DC | PRN
  Administered 2011-06-22: 100 ug via INTRAVENOUS

## 2011-06-22 MED ORDER — HYDROMORPHONE HCL PF 1 MG/ML IJ SOLN: 0.2500 mg | INTRAMUSCULAR | Status: DC | PRN

## 2011-06-22 MED ORDER — SUCCINYLCHOLINE CHLORIDE 20 MG/ML IJ SOLN
INTRAMUSCULAR | Status: DC | PRN
  Administered 2011-06-22: 100 mg via INTRAVENOUS

## 2011-06-22 MED ORDER — OXYCODONE-ACETAMINOPHEN 10-325 MG PO TABS: 1.0000 | ORAL_TABLET | Freq: Four times a day (QID) | ORAL | Status: DC | PRN

## 2011-06-22 MED ORDER — ONDANSETRON HCL 4 MG/2ML IJ SOLN
INTRAMUSCULAR | Status: DC | PRN
  Administered 2011-06-22: 4 mg via INTRAVENOUS

## 2011-06-22 MED ORDER — BUPIVACAINE HCL (PF) 0.25 % IJ SOLN
INTRAMUSCULAR | Status: DC | PRN
  Administered 2011-06-22: 3 mL

## 2011-06-22 MED ORDER — PROPOFOL 10 MG/ML IV EMUL
INTRAVENOUS | Status: DC | PRN
  Administered 2011-06-22: 200 mg via INTRAVENOUS

## 2011-06-22 MED ORDER — LIDOCAINE HCL (CARDIAC) 20 MG/ML IV SOLN
INTRAVENOUS | Status: DC | PRN
  Administered 2011-06-22: 4 mg via INTRAVENOUS

## 2011-06-22 MED ORDER — LACTATED RINGERS IV SOLN
INTRAVENOUS | Status: DC
  Administered 2011-06-22: 08:00:00 via INTRAVENOUS

## 2011-06-22 MED ORDER — CIPROFLOXACIN IN D5W 400 MG/200ML IV SOLN
400.0000 mg | INTRAVENOUS | Status: AC
  Administered 2011-06-22: 400 mg via INTRAVENOUS

## 2011-06-22 MED ORDER — OXYCODONE-ACETAMINOPHEN 5-325 MG PO TABS
1.0000 | ORAL_TABLET | Freq: Once | ORAL | Status: AC | PRN
  Administered 2011-06-22: 1 via ORAL

## 2011-06-22 MED ORDER — MIDAZOLAM HCL 5 MG/5ML IJ SOLN
INTRAMUSCULAR | Status: DC | PRN
  Administered 2011-06-22: 2 mg via INTRAVENOUS

## 2011-06-22 SURGICAL SUPPLY — 50 items
ADH SKN CLS APL DERMABOND .7 (GAUZE/BANDAGES/DRESSINGS) ×1
BLADE SURG 15 STRL LF DISP TIS (BLADE) ×1 IMPLANT
BLADE SURG 15 STRL SS (BLADE) ×2
BLADE SURG ROTATE 9660 (MISCELLANEOUS) IMPLANT
CANISTER SUCTION 1200CC (MISCELLANEOUS) IMPLANT
CHLORAPREP W/TINT 26ML (MISCELLANEOUS) ×2 IMPLANT
CLOTH BEACON ORANGE TIMEOUT ST (SAFETY) ×2 IMPLANT
COVER MAYO STAND STRL (DRAPES) ×2 IMPLANT
COVER TABLE BACK 60X90 (DRAPES) ×2 IMPLANT
DECANTER SPIKE VIAL GLASS SM (MISCELLANEOUS) IMPLANT
DERMABOND ADVANCED (GAUZE/BANDAGES/DRESSINGS) ×1
DERMABOND ADVANCED .7 DNX12 (GAUZE/BANDAGES/DRESSINGS) ×1 IMPLANT
DRAIN CHANNEL 19F RND (DRAIN) ×1 IMPLANT
DRAPE PED LAPAROTOMY (DRAPES) ×2 IMPLANT
DRSG TEGADERM 4X4.75 (GAUZE/BANDAGES/DRESSINGS) ×1 IMPLANT
ELECT COATED BLADE 2.86 ST (ELECTRODE) IMPLANT
ELECT REM PT RETURN 9FT ADLT (ELECTROSURGICAL) ×2
ELECTRODE REM PT RTRN 9FT ADLT (ELECTROSURGICAL) ×1 IMPLANT
EVACUATOR SILICONE 100CC (DRAIN) ×1 IMPLANT
GAUZE PACKING IODOFORM 1/4X5 (PACKING) IMPLANT
GAUZE SPONGE 4X4 12PLY STRL LF (GAUZE/BANDAGES/DRESSINGS) IMPLANT
GLOVE BIO SURGEON STRL SZ 6.5 (GLOVE) ×1 IMPLANT
GLOVE BIO SURGEON STRL SZ7 (GLOVE) ×3 IMPLANT
GLOVE BIOGEL PI IND STRL 7.0 (GLOVE) IMPLANT
GLOVE BIOGEL PI IND STRL 7.5 (GLOVE) ×1 IMPLANT
GLOVE BIOGEL PI INDICATOR 7.0 (GLOVE) ×1
GLOVE BIOGEL PI INDICATOR 7.5 (GLOVE) ×2
GOWN PREVENTION PLUS XLARGE (GOWN DISPOSABLE) ×4 IMPLANT
NDL HYPO 25X1 1.5 SAFETY (NEEDLE) ×1 IMPLANT
NEEDLE HYPO 25X1 1.5 SAFETY (NEEDLE) ×2 IMPLANT
NS IRRIG 1000ML POUR BTL (IV SOLUTION) IMPLANT
PACK BASIN DAY SURGERY FS (CUSTOM PROCEDURE TRAY) ×2 IMPLANT
PENCIL BUTTON HOLSTER BLD 10FT (ELECTRODE) ×2 IMPLANT
STRIP CLOSURE SKIN 1/2X4 (GAUZE/BANDAGES/DRESSINGS) ×1 IMPLANT
SUT ETHILON 2 0 FS 18 (SUTURE) ×1 IMPLANT
SUT MNCRL AB 4-0 PS2 18 (SUTURE) ×2 IMPLANT
SUT SILK 2 0 SH (SUTURE) IMPLANT
SUT VIC AB 2-0 SH 27 (SUTURE) ×2
SUT VIC AB 2-0 SH 27XBRD (SUTURE) IMPLANT
SUT VICRYL 3-0 CR8 SH (SUTURE) ×1 IMPLANT
SUT VICRYL 4-0 PS2 18IN ABS (SUTURE) IMPLANT
SWAB CULTURE LIQ STUART DBL (MISCELLANEOUS) IMPLANT
SYR CONTROL 10ML LL (SYRINGE) ×2 IMPLANT
TOWEL OR 17X24 6PK STRL BLUE (TOWEL DISPOSABLE) ×2 IMPLANT
TOWEL OR NON WOVEN STRL DISP B (DISPOSABLE) ×2 IMPLANT
TUBE ANAEROBIC SPECIMEN COL (MISCELLANEOUS) IMPLANT
TUBE CONNECTING 20X1/4 (TUBING) IMPLANT
UNDERPAD 30X30 INCONTINENT (UNDERPADS AND DIAPERS) ×1 IMPLANT
WATER STERILE IRR 1000ML POUR (IV SOLUTION) ×1 IMPLANT
YANKAUER SUCT BULB TIP NO VENT (SUCTIONS) IMPLANT

## 2011-06-22 NOTE — Anesthesia Postprocedure Evaluation (Signed)
  Anesthesia Post-op Note  Patient: Andrew [name]  Procedure(s) Performed:  EXCISION LIPOMA  Patient Location: PACU  Anesthesia Type: General  Level of Consciousness: alert   Airway and Oxygen Therapy: Patient Spontanous Breathing  Post-op Pain: none  Post-op Assessment: Post-op Vital signs reviewed  Post-op Vital Signs: stable  Complications: No apparent anesthesia complications

## 2011-06-22 NOTE — Anesthesia Preprocedure Evaluation (Addendum)
Anesthesia Evaluation  Patient identified by MRN, date of birth, ID band Patient awake    Reviewed: Allergy & Precautions, H&P , Patient's Chart, lab work & pertinent test results  Airway Mallampati: I TM Distance: >3 FB Neck ROM: full    Dental  (+) Missing and Poor Dentition,    Pulmonary          Cardiovascular hypertension, On Medications and Pt. on medications     Neuro/Psych    GI/Hepatic   Endo/Other  Diabetes mellitus-, Well Controlled, Type 2, Oral Hypoglycemic Agents  Renal/GU      Musculoskeletal   Abdominal   Peds  Hematology   Anesthesia Other Findings   Reproductive/Obstetrics                         Anesthesia Physical Anesthesia Plan  ASA: II  Anesthesia Plan: General ETT   Post-op Pain Management:    Induction:   Airway Management Planned:   Additional Equipment:   Intra-op Plan:   Post-operative Plan:   Informed Consent: I have reviewed the patients History and Physical, chart, labs and discussed the procedure including the risks, benefits and alternatives for the proposed anesthesia with the patient or authorized representative who has indicated his/her understanding and acceptance.     Plan Discussed with: CRNA and Surgeon  Anesthesia Plan Comments:         Anesthesia Quick Evaluation

## 2011-06-22 NOTE — Transfer of Care (Signed)
Immediate Anesthesia Transfer of Care Note  Patient: Andrew [name]  Procedure(s) Performed:  EXCISION LIPOMA  Patient Location: PACU  Anesthesia Type: General  Level of Consciousness: awake, alert  and patient cooperative  Airway & Oxygen Therapy: Patient Spontanous Breathing and Patient connected to face mask oxygen  Post-op Assessment: Report given to PACU RN, Post -op Vital signs reviewed and stable and Patient moving all extremities X 4  Post vital signs: Reviewed and stable  Complications: No apparent anesthesia complications

## 2011-06-22 NOTE — Op Note (Signed)
Preoperative diagnosis: 10 x 10 cm back lipoma Postoperative diagnosis: Same as above  Procedure: Excision of 10 x 12 cm subcutaneous back lipoma Surgeon: Dr. Harden Mo Anesthesia: Gen. Endotracheal anesthesia Estimated blood loss: Minimal Drains: 19 [place] Blake drain Complications: None Specimens: Back mass to pathology Sponge needle count was correct x2 at end of operation Disposition patient to recovery room in stable condition  Indications: This is a 55 year old male with a symptomatic upper back mass that has been enlarging. His hearing is enlarged quickly oversewn time in we discussed excision.  Procedure: After informed consent was obtained the patient was taken to the operating room. He was maintained on his aspirin to the operation due to his history of multiple strokes. He was administered 1 g of intravenous cefazolin. Sequential compression devices were placed on his legs prior to induction of anesthesia. He was then placed under general endotracheal anesthesia without complication. He was then rolled into the prone position and appropriately padded. His back was then prepped and draped in the standard sterile surgical fashion. The surgical timeout was performed.  I made a vertical incision overlying the mass. Cautery was then used to remove this what appeared to be a very large lipoma in its entirety. This was adherent to the fascia of his back musculature the fascia was removed as well. His was then passed off the table as a specimen. I then obtained hemostasis. Irrigation was performed. I then placed a 28 [place] Blake drain to the space and secured this with a 2-0 nylon suture. I then closed this with 3-0 Vicryl and staples. Bacitracin was placed over the wound. A sterile dressing was placed. He was extubated and transferred to recovery room in stable condition.

## 2011-06-22 NOTE — Anesthesia Procedure Notes (Signed)
Procedure Name: Intubation Date/Time: 06/22/2011 9:23 AM Performed by: Andrew [name] Pre-anesthesia Checklist: Patient identified, Emergency Drugs available, Suction available and Patient being monitored Patient Re-evaluated:Patient Re-evaluated prior to inductionOxygen Delivery Method: Circle System Utilized Preoxygenation: Pre-oxygenation with 100% oxygen Intubation Type: IV induction Ventilation: Mask ventilation without difficulty Laryngoscope Size: Mac and 3 Grade View: Grade I Tube type: Oral Tube size: 7.0 mm Number of attempts: 1 Airway Equipment and Method: stylet Placement Confirmation: ETT inserted through vocal cords under direct vision,  positive ETCO2 and breath sounds checked- equal and bilateral Secured at: 22 cm Tube secured with: Tape Dental Injury: Teeth and Oropharynx as per pre-operative assessment

## 2011-06-22 NOTE — H&P (Signed)
Andrew [name] is an 55 y.o. male.   Chief Complaint: back mass HPI:  This is a 55 year old male with a significant history of diabetes, hypertension, strokes. He presents with about a 2 year history of a lump on his upper back that is increased in size over that time. It is begun causing him some discomfort at this time. Has no history of infection. Nothing really relieves the discomfort. It is aggravated by doing some movements or wearing his clothes.     Past Medical History  Diagnosis Date  . CRD (chronic renal disease), stage II     Baseline Cr 1.2  . Prostatitis     hx of x2  . HTN (hypertension)   . HLD (hyperlipidemia)   . T2DM (type 2 diabetes mellitus)   . 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 Sarcoidosis   . Arthritis   . Leg swelling     Past Surgical History  Procedure Date  . Transthoracic echocardiogram 2005    EF 60%  . Ercp 2006    Normal  . Hernia repair 1985, 1995    x2    Family History  Problem Relation Age of Onset  . Bone cancer Father   . Cancer Father 36    bone  . Uterine cancer Mother   . Heart attack Brother    Social History:  reports that he has never smoked. He has never used smokeless tobacco. He reports that he drinks alcohol. He reports that he does not use illicit drugs.  Allergies:  Allergies  Allergen Reactions  . Metoprolol Succinate     REACTION: bradycardia (HR to 42)  . Penicillins     itching    Medications Prior to Admission  Medication Dose Route Frequency Provider Last Rate Last Dose  . ciprofloxacin (CIPRO) IVPB 400 mg  400 mg Intravenous 120 min pre-op Emelia Loron, MD   400 mg at 06/22/11 0806  . lactated ringers infusion   Intravenous Continuous Constance Goltz, MD 10 mL/hr at 06/22/11 1610     Medications Prior to Admission  Medication Sig Dispense Refill  . amLODipine (NORVASC) 10 MG tablet Take 1 tablet (10 mg total) by mouth daily.  30 tablet  5   . aspirin (BAYER CHILDRENS ASPIRIN) 81 MG chewable tablet Chew 81 mg by mouth daily.        . chlorhexidine (PERIDEX) 0.12 % solution Use as directed 15 mLs in the mouth or throat 2 (two) times daily.  480 mL  3  . clindamycin-benzoyl peroxide (BENZACLIN) gel Apply topically 2 (two) times daily.  25 g  1  . enalapril (VASOTEC) 20 MG tablet Take 2 tablets (40 mg total) by mouth daily.  60 tablet  5  . lovastatin (MEVACOR) 40 MG tablet Take 1 tablet (40 mg total) by mouth at bedtime.  30 tablet  5  . metFORMIN (GLUCOPHAGE) 500 MG tablet Take one tab in the morning and 2 tabs in the evening  90 tablet  5  . traMADol (ULTRAM) 50 MG tablet Take 1-2 tablets (50-100 mg total) by mouth every 8 (eight) hours as needed for pain.  90 tablet  1  . traZODone (DESYREL) 100 MG tablet Take 1 tablet (100 mg total) by mouth at bedtime.  30 tablet  5  . triamterene-hydrochlorothiazide (MAXZIDE-25) 37.5-25 MG per tablet Take 1 each (1 tablet total) by mouth daily.  30 tablet  5  . pravastatin (PRAVACHOL) 80 MG tablet       . triamcinolone (KENALOG) 0.1 % cream APPLY TOPICALLY TWICE DAILY  45 g  0    Results for orders placed during the hospital encounter of 06/22/11 (from the past 48 hour(s))  BASIC METABOLIC PANEL     Status: Abnormal   Collection Time   06/21/11 11:30 AM      Component Value Range Comment   Sodium 138  135 - 145 (mEq/L)    Potassium 3.9  3.5 - 5.1 (mEq/L)    Chloride 102  96 - 112 (mEq/L)    CO2 27  19 - 32 (mEq/L)    Glucose, Bld 116 (*) 70 - 99 (mg/dL)    BUN 12  6 - 23 (mg/dL)    Creatinine, Ser 1.19  0.50 - 1.35 (mg/dL)    Calcium 9.3  8.4 - 10.5 (mg/dL)    GFR calc non Af Amer 62 (*) >90 (mL/min)    GFR calc Af Amer 72 (*) >90 (mL/min)   CBC     Status: Normal   Collection Time   06/21/11 11:30 AM      Component Value Range Comment   WBC 6.0  4.0 - 10.5 (K/uL)    RBC 4.44  4.22 - 5.81 (MIL/uL)    Hemoglobin 13.8  13.0 - 17.0 (g/dL)    HCT 14.7  82.9 - 56.2 (%)    MCV 91.4   78.0 - 100.0 (fL)    MCH 31.1  26.0 - 34.0 (pg)    MCHC 34.0  30.0 - 36.0 (g/dL)    RDW 13.0  86.5 - 78.4 (%)    Platelets 208  150 - 400 (K/uL)   GLUCOSE, CAPILLARY     Status: Abnormal   Collection Time   06/22/11  7:57 AM      Component Value Range Comment   Glucose-Capillary 133 (*) 70 - 99 (mg/dL)    No results found.  ROS  Blood pressure 156/89, pulse 72, temperature 97.7 F (36.5 C), temperature source Oral, resp. rate 18, height 6\' 1"  (1.854 m), weight 210 lb (95.255 kg), SpO2 97.00%. Physical Exam  Constitutional: He appears well-developed and well-nourished.  Neck: Neck supple.  Cardiovascular: Normal rate, regular rhythm and normal heart sounds.   Respiratory: Effort normal and breath sounds normal.       Assessment/Plan  Back lipoma  This appears to be a benign lipoma but it is causing him some symptoms. I gave him the option of observation he would very much like to have this area excised. We discussed excision under general as I don't think I can remove this in the office her just under local. We discussed possibility of a drain postoperatively we discussed the risks including medical issues like strokes, bleeding, infection, seroma. I would obtain clearance from his primary care physician first. I'm going to keep him on his aspirin qsurgery as well.   [name] 06/22/2011, 9:10 AM

## 2011-06-25 ENCOUNTER — Encounter (HOSPITAL_BASED_OUTPATIENT_CLINIC_OR_DEPARTMENT_OTHER): Payer: Self-pay | Admitting: General Surgery

## 2011-06-26 ENCOUNTER — Ambulatory Visit (INDEPENDENT_AMBULATORY_CARE_PROVIDER_SITE_OTHER): Payer: Medicare PPO | Admitting: General Surgery

## 2011-06-26 ENCOUNTER — Encounter (INDEPENDENT_AMBULATORY_CARE_PROVIDER_SITE_OTHER): Payer: Self-pay | Admitting: General Surgery

## 2011-06-26 DIAGNOSIS — Z09 Encounter for follow-up examination after completed treatment for conditions other than malignant neoplasm: Secondary | ICD-10-CM

## 2011-06-26 NOTE — Progress Notes (Signed)
Subjective:     Patient ID: MARISOL GLAZER, male   DOB: Dec 05, 1955, 55 y.o.   MRN: [id number]  HPI This is a 55 year old male who I took a very large back lipoma off last week. He is doing well without any complaints. I put a drain in it and he comes in today prior to going on vacation and the drain removed. His drain is putting out less than 30 cc per day at this point.  Review of Systems     Objective:   Physical Exam Healing back incision without infection, staples in place, some bruising, jp with old hematoma present    Assessment:     S/p lipoma excision    Plan:     Discussed path Drain removed Return in Jan when return for staple removal Warned of risks of seroma and symptoms for which he should be seen

## 2011-07-13 ENCOUNTER — Encounter (INDEPENDENT_AMBULATORY_CARE_PROVIDER_SITE_OTHER): Payer: Self-pay | Admitting: General Surgery

## 2011-07-13 ENCOUNTER — Ambulatory Visit (INDEPENDENT_AMBULATORY_CARE_PROVIDER_SITE_OTHER): Payer: Medicare PPO | Admitting: General Surgery

## 2011-07-13 VITALS — BP 158/90 | HR 88 | Temp 98.0°F | Resp 18 | Ht 73.0 in | Wt 210.0 lb

## 2011-07-13 DIAGNOSIS — Z09 Encounter for follow-up examination after completed treatment for conditions other than malignant neoplasm: Secondary | ICD-10-CM

## 2011-07-13 MED ORDER — OXYCODONE-ACETAMINOPHEN 5-325 MG PO TABS: 1.0000 | ORAL_TABLET | ORAL | Status: DC | PRN

## 2011-07-13 NOTE — Progress Notes (Signed)
Subjective:     Patient ID: Andrew [name], male   DOB: 06-27-1956, 56 y.o.   MRN: [id number]  HPI This is a 56 year old male who presents after excision of a lipoma of his upper back. He's doing pretty well except for some pain around the side extending to his left shoulder. He is here for staple removal. He just had a trip to [place].  Review of Systems     Objective:   Physical Exam Healing back incision without infection or seroma    Assessment:     S/p lipoma excision    Plan:     Remainder of staples removed I think pain is likely from positioning or car ride and should resolve over time.

## 2011-08-15 ENCOUNTER — Encounter: Payer: Self-pay | Admitting: Family Medicine

## 2011-08-15 ENCOUNTER — Ambulatory Visit (INDEPENDENT_AMBULATORY_CARE_PROVIDER_SITE_OTHER): Payer: Medicare PPO | Admitting: Family Medicine

## 2011-08-15 VITALS — BP 147/85 | HR 70 | Ht 73.0 in | Wt 220.0 lb

## 2011-08-15 DIAGNOSIS — D171 Benign lipomatous neoplasm of skin and subcutaneous tissue of trunk: Secondary | ICD-10-CM

## 2011-08-15 DIAGNOSIS — D1779 Benign lipomatous neoplasm of other sites: Secondary | ICD-10-CM

## 2011-08-15 DIAGNOSIS — E119 Type 2 diabetes mellitus without complications: Secondary | ICD-10-CM

## 2011-08-15 DIAGNOSIS — I10 Essential (primary) hypertension: Secondary | ICD-10-CM

## 2011-08-15 DIAGNOSIS — E785 Hyperlipidemia, unspecified: Secondary | ICD-10-CM

## 2011-08-15 DIAGNOSIS — M2550 Pain in unspecified joint: Secondary | ICD-10-CM

## 2011-08-15 LAB — COMPREHENSIVE METABOLIC PANEL
AST: 21 U/L (ref 0–37)
Alkaline Phosphatase: 55 U/L (ref 39–117)
BUN: 13 mg/dL (ref 6–23)
Creat: 1.29 mg/dL (ref 0.50–1.35)
Glucose, Bld: 123 mg/dL — ABNORMAL HIGH (ref 70–99)
Total Bilirubin: 0.4 mg/dL (ref 0.3–1.2)

## 2011-08-15 LAB — POCT GLYCOSYLATED HEMOGLOBIN (HGB A1C): Hemoglobin A1C: 6.8

## 2011-08-15 MED ORDER — ENALAPRIL MALEATE 20 MG PO TABS: 40.0000 mg | ORAL_TABLET | Freq: Every day | ORAL | Status: DC

## 2011-08-15 MED ORDER — TRAMADOL HCL 50 MG PO TABS: 50.0000 mg | ORAL_TABLET | Freq: Three times a day (TID) | ORAL | Status: DC | PRN

## 2011-08-15 MED ORDER — LOVASTATIN 40 MG PO TABS: 40.0000 mg | ORAL_TABLET | Freq: Every day | ORAL | Status: DC

## 2011-08-15 MED ORDER — METFORMIN HCL 1000 MG PO TABS: 1000.0000 mg | ORAL_TABLET | Freq: Two times a day (BID) | ORAL | Status: DC

## 2011-08-15 MED ORDER — CHLORHEXIDINE GLUCONATE 0.12 % MT SOLN: 15.0000 mL | Freq: Two times a day (BID) | OROMUCOSAL | Status: DC

## 2011-08-15 MED ORDER — TRIAMTERENE-HCTZ 37.5-25 MG PO TABS: 1.0000 | ORAL_TABLET | Freq: Every day | ORAL | Status: DC

## 2011-08-15 MED ORDER — TRAZODONE HCL 100 MG PO TABS: 100.0000 mg | ORAL_TABLET | Freq: Every day | ORAL | Status: DC

## 2011-08-15 MED ORDER — AMLODIPINE BESYLATE 10 MG PO TABS: 10.0000 mg | ORAL_TABLET | Freq: Every day | ORAL | Status: DC

## 2011-08-15 NOTE — Assessment & Plan Note (Signed)
S/p surgical excision.  Well healed

## 2011-08-15 NOTE — Assessment & Plan Note (Addendum)
Taking lovastatin, recheck direct LDL today on medication

## 2011-08-15 NOTE — Assessment & Plan Note (Signed)
Diabetes control slightly worse with hemoglobin A1c of 6.8 today, prior 6.3. I will have him increase his metformin to 1000 mg twice a day. He'll also been using real sugar in his beverages status blood. He states he is going to switch back to Splenda if this helps improve his sugars. Also plans to try to walk more.

## 2011-08-15 NOTE — Assessment & Plan Note (Signed)
On Multiple agents. Blood pressure looks pretty good today at 145/85. I think better control is partially due to better pain control. No change in medication today, check CMet

## 2011-08-15 NOTE — Patient Instructions (Signed)
It was good to see you today Your diabetes is slightly less controlled than it has been.  I would like for you to take two metformin in the morning and two at night. I will let you know your lab results when they return.  I will see you back in 3 months, have a great day

## 2011-08-15 NOTE — Assessment & Plan Note (Signed)
Evidence of arthritis in multiple joints. His pain to be well controlled with the addition of tramadol. Would avoid NSAIDs in him given his history of renal insufficiency.

## 2011-08-15 NOTE — Progress Notes (Signed)
  Subjective:    Patient ID: Andrew [name], male    DOB: October 08, 1955, 56 y.o.   MRN: [id number]  HPI Here for followup of chronic medical conditions 1. diabetes mellitus: Patient is currently on monotherapy with metformin, 500 mg every morning and 1000 mg each bedtime. No problems with current medications. He has been using real sugar in his beverages which he thinks may be contributing to some higher sugars. Typically checks his sugar fasting is in the low 100s but dose get up into the 150s-175 range at some points during the day. He is not exercising as much as he was before due to the cold weather outside.  2. Hypertension: Compliant with all his medications. He is getting good numbers when checking his blood pressure at home. Typically systolic he is in the 140s. Denies any feelings of low blood pressure, denies dizziness. He denies any chest pain, palpitations, headache, weakness, shortness of breath.  3. Arthritis: His joints do bother him a lot of the time. However his pain is better controlled with the addition of tramadol. He does not need this all the time. When he does take this he typically takes 50 mg. He occasionally does take 100 mg if he had been working or exercising throughout the day.  4. Lipoma: Status post excision by general surgery. No complications with this. Staples removed, well-healed.   Review of Systems     Objective:   Physical Exam Generally: Well-appearing well-nourished, no acute distress Neck: Supple without adenopathy or thyromegaly. Heart: Regular rate and rhythm, no murmur, no rub, no gallop. Pulmonary: Lungs are clear to auscultation bilaterally without wheezing or crackles. No increased work of breathing. Extremities: Some crepitus in range of motion testing of shoulders and knees bilaterally. Does have limited range of motion of left shoulder. Full range of motion present in knees bilaterally. No edema present.       Assessment & Plan:

## 2011-08-27 ENCOUNTER — Encounter: Payer: Self-pay | Admitting: Family Medicine

## 2012-01-07 ENCOUNTER — Ambulatory Visit: Payer: Medicare PPO | Admitting: Family Medicine

## 2012-01-16 ENCOUNTER — Ambulatory Visit (INDEPENDENT_AMBULATORY_CARE_PROVIDER_SITE_OTHER): Payer: Medicare PPO | Admitting: Family Medicine

## 2012-01-16 ENCOUNTER — Encounter: Payer: Self-pay | Admitting: Family Medicine

## 2012-01-16 VITALS — BP 158/82 | HR 68 | Temp 98.1°F | Ht 73.0 in | Wt 216.8 lb

## 2012-01-16 DIAGNOSIS — M25569 Pain in unspecified knee: Secondary | ICD-10-CM

## 2012-01-16 DIAGNOSIS — L731 Pseudofolliculitis barbae: Secondary | ICD-10-CM

## 2012-01-16 DIAGNOSIS — L738 Other specified follicular disorders: Secondary | ICD-10-CM

## 2012-01-16 DIAGNOSIS — E785 Hyperlipidemia, unspecified: Secondary | ICD-10-CM

## 2012-01-16 DIAGNOSIS — E119 Type 2 diabetes mellitus without complications: Secondary | ICD-10-CM

## 2012-01-16 DIAGNOSIS — I10 Essential (primary) hypertension: Secondary | ICD-10-CM

## 2012-01-16 LAB — COMPREHENSIVE METABOLIC PANEL
Albumin: 4.2 g/dL (ref 3.5–5.2)
Alkaline Phosphatase: 57 U/L (ref 39–117)
CO2: 27 mEq/L (ref 19–32)
Calcium: 9.2 mg/dL (ref 8.4–10.5)
Chloride: 100 mEq/L (ref 96–112)
Glucose, Bld: 121 mg/dL — ABNORMAL HIGH (ref 70–99)
Potassium: 3.6 mEq/L (ref 3.5–5.3)
Sodium: 136 mEq/L (ref 135–145)
Total Protein: 7.7 g/dL (ref 6.0–8.3)

## 2012-01-16 NOTE — Patient Instructions (Addendum)
Thank you for coming in today, it was good to see you Your hgb a1c looks better today, good job with the 4 lb weight loss I will see you back in 6 months or sooner as needed.  I would like for you to see an eye doctor before your next appointment.

## 2012-01-20 ENCOUNTER — Encounter: Payer: Self-pay | Admitting: Family Medicine

## 2012-01-20 MED ORDER — TRAMADOL HCL 50 MG PO TABS: 50.0000 mg | ORAL_TABLET | Freq: Three times a day (TID) | ORAL | Status: DC | PRN

## 2012-01-20 MED ORDER — CHLORHEXIDINE GLUCONATE 0.12 % MT SOLN: 15.0000 mL | Freq: Two times a day (BID) | OROMUCOSAL | Status: DC

## 2012-01-20 MED ORDER — METFORMIN HCL 1000 MG PO TABS: 1000.0000 mg | ORAL_TABLET | Freq: Two times a day (BID) | ORAL | Status: DC

## 2012-01-20 MED ORDER — TRAZODONE HCL 100 MG PO TABS: 100.0000 mg | ORAL_TABLET | Freq: Every day | ORAL | Status: DC

## 2012-01-20 MED ORDER — LOVASTATIN 40 MG PO TABS: 40.0000 mg | ORAL_TABLET | Freq: Every day | ORAL | Status: DC

## 2012-01-20 MED ORDER — AMLODIPINE BESYLATE 10 MG PO TABS: 10.0000 mg | ORAL_TABLET | Freq: Every day | ORAL | Status: DC

## 2012-01-20 MED ORDER — CLINDAMYCIN PHOS-BENZOYL PEROX 1-5 % EX GEL: Freq: Two times a day (BID) | CUTANEOUS | Status: AC

## 2012-01-20 MED ORDER — TRIAMTERENE-HCTZ 37.5-25 MG PO TABS: 1.0000 | ORAL_TABLET | Freq: Every day | ORAL | Status: DC

## 2012-01-20 NOTE — Assessment & Plan Note (Signed)
Continue tramadol as needed.  Good response to medication, able to work some again and exercise.

## 2012-01-20 NOTE — Progress Notes (Signed)
  Subjective:    Patient ID: Andrew [name], male    DOB: [birth year], 56 y.o.   MRN: [id number]  HPI 1.  DM:  Checks blood sugar at home occasionally.  Typically in the low 100's. He is compliant with his medications and diet.  Better pain control has allowed him to walk more.  He denies any symptoms of excess thirst or urination.    2.  Joint Pain:  History of b/l knee arthritis.  Currently using tramadol for pain control.  Needing to use 100mg  daily for pain control, but this works well.  It has allowed him to work some again and he is helping with a [employer] company.  3.  HTN:  Does not monitor BP at home.  He is compliant with his medication but he needs refills on them.  He has just taken his medications this morning.  He denies chest pain, headache, palpitations, diziness.     Review of Systems Per HPI    Objective:   Physical Exam  Constitutional:       AA Male, non-distressed   Eyes: No scleral icterus.  Neck: Neck supple.  Cardiovascular: Normal rate, regular rhythm and normal heart sounds.   Pulmonary/Chest: Effort normal and breath sounds normal.  Musculoskeletal: He exhibits no edema.       No joint swelling or tenderness on exam.  Gait normal.           Assessment & Plan:

## 2012-01-20 NOTE — Assessment & Plan Note (Signed)
Well controlled. Continue current medications.  Weight down 4 lbs, congratulated.  Foot exam done

## 2012-01-20 NOTE — Assessment & Plan Note (Signed)
BP elevated today.  Had taken medication only a few minutes prior to appointment.  No medication adjustments at this time. Check labs

## 2012-02-06 ENCOUNTER — Encounter: Payer: Self-pay | Admitting: Family Medicine

## 2012-03-03 ENCOUNTER — Encounter: Payer: Self-pay | Admitting: Family Medicine

## 2012-03-03 ENCOUNTER — Ambulatory Visit (INDEPENDENT_AMBULATORY_CARE_PROVIDER_SITE_OTHER): Payer: Medicare PPO | Admitting: Family Medicine

## 2012-03-03 VITALS — BP 156/94 | HR 74 | Temp 98.5°F | Ht 73.0 in | Wt 210.0 lb

## 2012-03-03 DIAGNOSIS — R1032 Left lower quadrant pain: Secondary | ICD-10-CM

## 2012-03-03 DIAGNOSIS — E119 Type 2 diabetes mellitus without complications: Secondary | ICD-10-CM

## 2012-03-03 LAB — BASIC METABOLIC PANEL
BUN: 12 mg/dL (ref 6–23)
Potassium: 3.6 mEq/L (ref 3.5–5.3)
Sodium: 138 mEq/L (ref 135–145)

## 2012-03-03 NOTE — Assessment & Plan Note (Signed)
56 yo male with hx of 2 previous hernia repairs presents to clinic after recent heavy lifting with symptoms of L inguinal hernia. Little concern at the moment bowel incarceration based on the mild nature of the pt's pain and no reported blood in stool.   Will obtain US of groin to evaluate inguinal area.  Given signs/symptoms that should prompt his return.

## 2012-03-03 NOTE — Patient Instructions (Addendum)
Thank you for coming in today, it was good to see you There may be a hernia, we will get a CT to evaluate your pain further.

## 2012-03-03 NOTE — Progress Notes (Signed)
Subjective:     Patient ID: Andrew [name], male   DOB: 05/24/1956, 56 y.o.   MRN: [id number]  HPI Patient is a 56 yo male who presents to clinic for concerns about possible hernia.  Pt states that he was lifting a heavy table 4 days ago when 2 hours later he became nauseous, vomited, hot/cold episodes, sweating and noticed a "heaviness"/pain in his L groin.  Initially he felt a tingling sensation with urination but this has since resolved. Pt endorses mild constipation, fatigue, and mild decrease in appetite since his symptoms began.  He has a history of 2 prior inguinal hernia repairs (R in 80's, L in 1994).  The pain is similar to the pain with previous hernias.  Review of Systems Denies pain with exertion, diarrhea, blood in stool, blood in urine, scrotal tenderness, abdominal pain, difficulty breating    Objective:   Physical Exam General: well appearing, in no acute distress Abd: Soft, NT/ND. NABS GU: mild bulge felt in L inguinal area worsened with cough/valsalva , R side nml, no TTP, no rash or bruising.  No adenopathy    Assessment:          Plan:

## 2012-03-04 ENCOUNTER — Telehealth: Payer: Self-pay | Admitting: *Deleted

## 2012-03-04 NOTE — Telephone Encounter (Signed)
Message copied by Jennette Bill on Tue Mar 04, 2012 11:45 AM ------      Message from: Everrett Coombe      Created: Mon Mar 03, 2012 10:21 PM       After looking over additional imaging for hernia, [place] is probably a better first step.  I have canceled CT Abd/Pelvis and ordered pelvic US to evaluate L inguinal area.  Please let me know if this needs to be changed to abdominal US.            Thanks

## 2012-03-04 NOTE — Telephone Encounter (Signed)
CT changed to U/S and scheduled for 03/05/12 at 2:15 with GI on [address]. Called and notified patient of this change and instructed him NOT to drink the contrast he picked up yesterday for the CT. Told him to take it back to GI at his appointment tomorrow. Patient voices understanding these changes.Busick, Rodena Medin

## 2012-03-05 ENCOUNTER — Telehealth: Payer: Self-pay | Admitting: Family Medicine

## 2012-03-05 ENCOUNTER — Other Ambulatory Visit: Payer: Medicare PPO

## 2012-03-05 ENCOUNTER — Ambulatory Visit
Admission: RE | Admit: 2012-03-05 | Discharge: 2012-03-05 | Disposition: A | Discharge status: Home / Self Care | Payer: Medicare PPO | Source: Ambulatory Visit | Attending: Family Medicine | Admitting: Family Medicine

## 2012-03-05 NOTE — Telephone Encounter (Signed)
Called patient and given results of [place].  No hernia, possible abdominal strain with some bulging at previous repair site.  Will f/u as scheduled.

## 2012-03-13 ENCOUNTER — Telehealth: Payer: Self-pay | Admitting: Family Medicine

## 2012-03-13 NOTE — Telephone Encounter (Signed)
Called pt.

## 2012-03-13 NOTE — Telephone Encounter (Signed)
Called pt back. No answer. Unable to leave message, no answering machine. Called pt's cell phone, disconnected. Pt needs OV to be evaluated for requesting meds. Waiting for pt to call back to inform. Lorenda Hatchet, Renato Battles

## 2012-03-13 NOTE — Telephone Encounter (Signed)
Is requesting either viagra or cialis - a few of either - told him that this might need an OV but he wanted me to send message to see  [employer]

## 2012-03-21 ENCOUNTER — Encounter: Payer: Self-pay | Admitting: Family Medicine

## 2012-03-21 ENCOUNTER — Ambulatory Visit (INDEPENDENT_AMBULATORY_CARE_PROVIDER_SITE_OTHER): Payer: Medicare PPO | Admitting: Family Medicine

## 2012-03-21 VITALS — BP 137/81 | HR 65 | Ht 73.0 in | Wt 211.0 lb

## 2012-03-21 DIAGNOSIS — Z23 Encounter for immunization: Secondary | ICD-10-CM

## 2012-03-21 DIAGNOSIS — E119 Type 2 diabetes mellitus without complications: Secondary | ICD-10-CM

## 2012-03-30 MED ORDER — GLUCOSE BLOOD VI STRP: ORAL_STRIP | Status: DC

## 2012-03-30 MED ORDER — PRODIGY LANCETS 26G MISC: Status: DC

## 2012-03-30 MED ORDER — PRODIGY AUTOCODE BLOOD GLUCOSE DEVI: Status: DC

## 2012-03-30 NOTE — Assessment & Plan Note (Addendum)
WIll send in rx for diabetic supplies to fax number [phone]) provided by patient, he will follow up in one month.

## 2012-03-30 NOTE — Progress Notes (Signed)
Patient ID: Andrew [name], male   DOB: [birth year], 56 y.o.   MRN: [id number] Patient here with no complaints, only needs to have diabetic testing supplies sent in.  Needs rx for prodigy meter, test strips and lancets.  Patient provides number that these need to be faxed to [phone].  He does still have some of his current test strips so he is testing his sugar, states that they are well controlled.  He is compliant with medications.  Taking ACE-I.  Not due for repeat A1C until next month.

## 2012-04-14 ENCOUNTER — Telehealth: Payer: Self-pay | Admitting: Family Medicine

## 2012-04-14 MED ORDER — BLOOD GLUCOSE METER KIT: PACK | Status: DC

## 2012-04-14 MED ORDER — BLOOD GLUCOSE TEST VI STRP: ORAL_STRIP | Status: DC

## 2012-04-14 NOTE — Telephone Encounter (Signed)
Patient is calling because he needs for Diabetic supplies to go to [facility].  He called several weeks ago for the same thing and has not heard anything back.Marland Kitchen He will be completely out next week.

## 2012-04-14 NOTE — Telephone Encounter (Signed)
Sent in again 

## 2012-04-14 NOTE — Telephone Encounter (Signed)
Will forward to Dr Matthews 

## 2012-04-22 NOTE — Telephone Encounter (Addendum)
Diabetic testing supplies were sent to [facility] via Epic on 04/14/12.  Spoke with [facility]---2 glucometers available that Medicare will cover and patient needs to call [phone] to inform them of the type of meter he would like.  Patient informed and will contact [facility].  Gaylene Brooks, RN

## 2012-04-22 NOTE — Telephone Encounter (Signed)
Received forms from [facility].  Completed and faxed back.

## 2012-05-20 ENCOUNTER — Other Ambulatory Visit: Payer: Self-pay | Admitting: Family Medicine

## 2012-05-21 ENCOUNTER — Encounter: Payer: Self-pay | Admitting: Family Medicine

## 2012-05-21 ENCOUNTER — Ambulatory Visit (INDEPENDENT_AMBULATORY_CARE_PROVIDER_SITE_OTHER): Payer: Medicare PPO | Admitting: Family Medicine

## 2012-05-21 VITALS — BP 164/86 | HR 73 | Temp 98.8°F | Ht 73.0 in | Wt 223.0 lb

## 2012-05-21 DIAGNOSIS — F528 Other sexual dysfunction not due to a substance or known physiological condition: Secondary | ICD-10-CM

## 2012-05-21 DIAGNOSIS — I10 Essential (primary) hypertension: Secondary | ICD-10-CM

## 2012-05-21 DIAGNOSIS — M25569 Pain in unspecified knee: Secondary | ICD-10-CM

## 2012-05-21 DIAGNOSIS — K047 Periapical abscess without sinus: Secondary | ICD-10-CM

## 2012-05-21 MED ORDER — SPIRONOLACTONE 25 MG PO TABS: 12.5000 mg | ORAL_TABLET | Freq: Every day | ORAL | Status: DC

## 2012-05-21 MED ORDER — CHLORTHALIDONE 25 MG PO TABS: 25.0000 mg | ORAL_TABLET | Freq: Every day | ORAL | Status: DC

## 2012-05-21 MED ORDER — HYDROCODONE-ACETAMINOPHEN 5-325 MG PO TABS: 1.0000 | ORAL_TABLET | Freq: Four times a day (QID) | ORAL | Status: DC | PRN

## 2012-05-21 MED ORDER — TADALAFIL 10 MG PO TABS: 10.0000 mg | ORAL_TABLET | Freq: Every day | ORAL | Status: DC | PRN

## 2012-05-21 NOTE — Patient Instructions (Addendum)
Thank you for coming in today, it was good to see you Avoid use of the ibuprofen Continue use of tramadol as needed for back and knee pain I will send a referral to sports medicine for your leg length discrepancy Use the vicodin short term for your tooth abscess Stop the maxzide(hctz-triamterene) I am going to add a medication called chlorthalidone and  spironolactone, please return in 2 weeks to recheck your blood pressure.

## 2012-05-22 ENCOUNTER — Telehealth: Payer: Self-pay | Admitting: Family Medicine

## 2012-05-22 NOTE — Telephone Encounter (Signed)
Needs labs for: A1C Urine protein test LDL chol test  Has appt on the 27th to see matthews and would like to come in that day for the labs - pls let him know if he needs to fast.

## 2012-05-22 NOTE — Telephone Encounter (Signed)
Called pt and advised to come in for his OV on 27th Nov and we can do the tests then. Does not have to be fasting for LDL. Pt agreed and will keep OV. Lorenda Hatchet, Renato Battles
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
## 2012-05-25 NOTE — Assessment & Plan Note (Signed)
Appears to have leg length discrepancy, continue tramadol for pain.  Refer to SM to discuss shoe inserts.

## 2012-05-25 NOTE — Assessment & Plan Note (Signed)
Change viagra to cialis.  Reiviewed risks with this medication especially if given nitrates.

## 2012-05-26 ENCOUNTER — Other Ambulatory Visit: Payer: Self-pay | Admitting: *Deleted

## 2012-05-26 DIAGNOSIS — K047 Periapical abscess without sinus: Secondary | ICD-10-CM | POA: Insufficient documentation

## 2012-05-26 NOTE — Assessment & Plan Note (Signed)
Currently on clindamycin rx'ed by dentist.  Given 800mg  ibuprofen tablets as well, advised to avoid this due to renal insufficiency.  Given rx for vicodin for pain control

## 2012-05-26 NOTE — Assessment & Plan Note (Signed)
BP remains elevated despite multiple agents.  K has been low-normal despite maxide and ace-i.  Per Up todate recommendations Will d/c maxide and start chlorthalidone and add spironolactone with hopes of better efficacy.  If having side effects from spironolactone can consider eplerenone.  F/u in 2 weeks.

## 2012-05-27 ENCOUNTER — Other Ambulatory Visit: Payer: Medicare PPO

## 2012-06-04 ENCOUNTER — Encounter: Payer: Self-pay | Admitting: Family Medicine

## 2012-06-04 ENCOUNTER — Ambulatory Visit (INDEPENDENT_AMBULATORY_CARE_PROVIDER_SITE_OTHER): Payer: Medicare PPO | Admitting: Family Medicine

## 2012-06-04 VITALS — BP 143/92 | HR 96 | Ht 73.0 in | Wt 221.0 lb

## 2012-06-04 DIAGNOSIS — E785 Hyperlipidemia, unspecified: Secondary | ICD-10-CM

## 2012-06-04 DIAGNOSIS — E119 Type 2 diabetes mellitus without complications: Secondary | ICD-10-CM

## 2012-06-04 DIAGNOSIS — I10 Essential (primary) hypertension: Secondary | ICD-10-CM

## 2012-06-04 LAB — LDL CHOLESTEROL, DIRECT: Direct LDL: 72 mg/dL

## 2012-06-04 LAB — POCT GLYCOSYLATED HEMOGLOBIN (HGB A1C): Hemoglobin A1C: 6.9

## 2012-06-04 MED ORDER — SPIRONOLACTONE 25 MG PO TABS: 25.0000 mg | ORAL_TABLET | Freq: Every day | ORAL | Status: DC

## 2012-06-04 NOTE — Patient Instructions (Addendum)
Thank you for coming in today, it was good to see you Increase your spironolactone to 25mg  Follow up with me in one month.  Make an appt for an eye exam.

## 2012-06-05 LAB — BASIC METABOLIC PANEL
CO2: 25 mEq/L (ref 19–32)
Chloride: 101 mEq/L (ref 96–112)
Creat: 1.33 mg/dL (ref 0.50–1.35)
Sodium: 137 mEq/L (ref 135–145)

## 2012-06-08 NOTE — Assessment & Plan Note (Addendum)
Pressure better but still not optimally controlled.  Will increase spironolactone to 25 mg and have him f/u in 4 weeks.

## 2012-06-08 NOTE — Assessment & Plan Note (Signed)
Recheck LDL today. 

## 2012-06-08 NOTE — Progress Notes (Signed)
  Subjective:    Patient ID: Andrew [name], male    DOB: 1956/03/16, 56 y.o.   MRN: [id number]  HPI Patient here for lab work for insurance purposes and   1.HTN: Started on chlorthalidone with spironolactone at previous appointment.  BP has improved from previous but still not optimally controlled.  He also finds it difficult for him to cut spironolactone tablet in half. He does plan to enroll in a walking program through his insurance provider.  He denies chest pain, palpitations, vision changes, headache, shortness of breath.   Review of Systems Per HPI    Objective:   Physical Exam  Constitutional: He appears well-nourished. No distress.  Cardiovascular: Normal rate and regular rhythm.   Pulmonary/Chest: Effort normal and breath sounds normal. No respiratory distress.  Musculoskeletal: He exhibits no edema.  Neurological: He is alert.          Assessment & Plan:

## 2012-06-09 ENCOUNTER — Telehealth: Payer: Self-pay | Admitting: Family Medicine

## 2012-06-09 NOTE — Telephone Encounter (Signed)
Fwd. To Dr.Matthews for info. Lorenda Hatchet, Renato Battles

## 2012-06-09 NOTE — Telephone Encounter (Signed)
Patient is calling to let Dr. Ashley Royalty know that the [facility] Advisory Committee is sending him a manual aparatus for him to try out for erection disfunction.  They have faxed a form that needs Dr. Ashley Royalty to sign off on it.

## 2012-06-10 NOTE — Telephone Encounter (Signed)
Checked box on 12/2 and 12/3 and have not received anything yet.

## 2012-06-26 ENCOUNTER — Ambulatory Visit (INDEPENDENT_AMBULATORY_CARE_PROVIDER_SITE_OTHER): Payer: Medicare PPO | Admitting: Family Medicine

## 2012-06-26 ENCOUNTER — Encounter: Payer: Self-pay | Admitting: Family Medicine

## 2012-06-26 VITALS — BP 116/80 | HR 62 | Temp 98.3°F | Ht 73.0 in | Wt 222.0 lb

## 2012-06-26 DIAGNOSIS — I10 Essential (primary) hypertension: Secondary | ICD-10-CM

## 2012-06-26 DIAGNOSIS — E119 Type 2 diabetes mellitus without complications: Secondary | ICD-10-CM

## 2012-06-26 DIAGNOSIS — E785 Hyperlipidemia, unspecified: Secondary | ICD-10-CM

## 2012-06-26 MED ORDER — KNEE BRACE MISC: Status: DC

## 2012-06-26 NOTE — Patient Instructions (Addendum)
Thank you for coming in today, it was good to see you Continue current medications I will see you back in 3 months or sooner as needed.

## 2012-07-03 MED ORDER — METFORMIN HCL 1000 MG PO TABS: 1000.0000 mg | ORAL_TABLET | Freq: Two times a day (BID) | ORAL | Status: DC

## 2012-07-03 MED ORDER — TRAMADOL HCL 50 MG PO TABS: 50.0000 mg | ORAL_TABLET | Freq: Three times a day (TID) | ORAL | Status: DC | PRN

## 2012-07-03 MED ORDER — ENALAPRIL MALEATE 20 MG PO TABS: 40.0000 mg | ORAL_TABLET | Freq: Every day | ORAL | Status: DC

## 2012-07-03 MED ORDER — LOVASTATIN 40 MG PO TABS: 40.0000 mg | ORAL_TABLET | Freq: Every day | ORAL | Status: DC

## 2012-07-03 MED ORDER — AMLODIPINE BESYLATE 10 MG PO TABS: 10.0000 mg | ORAL_TABLET | Freq: Every day | ORAL | Status: DC

## 2012-07-03 MED ORDER — SPIRONOLACTONE 25 MG PO TABS: 25.0000 mg | ORAL_TABLET | Freq: Every day | ORAL | Status: DC

## 2012-07-03 MED ORDER — CHLORTHALIDONE 25 MG PO TABS: 25.0000 mg | ORAL_TABLET | Freq: Every day | ORAL | Status: DC

## 2012-07-03 MED ORDER — TRAZODONE HCL 100 MG PO TABS: 100.0000 mg | ORAL_TABLET | Freq: Every day | ORAL | Status: DC

## 2012-07-03 NOTE — Assessment & Plan Note (Addendum)
BP much better controlled today.  Will continue current medications and have him follow up in 3 months

## 2012-07-03 NOTE — Progress Notes (Signed)
  Subjective:    Patient ID: Andrew [name], male    DOB: [birth year], 56 y.o.   MRN: [id number]  HPI  1. HTN:  Here for brief f/u of HTN.  He has been compliant with is medications and is walking more.  He does need refills on all medications.  Denies symptoms of hypotension including dizziness.  Also denies chest pain, headache, vision changes.    Review of Systems Per HPI    Objective:   Physical Exam  Constitutional: He appears well-nourished. No distress.  HENT:  Head: Normocephalic and atraumatic.  Cardiovascular: Normal rate and regular rhythm.   Pulmonary/Chest: Effort normal and breath sounds normal.  Musculoskeletal: He exhibits no edema.          Assessment & Plan:

## 2012-10-27 ENCOUNTER — Ambulatory Visit (INDEPENDENT_AMBULATORY_CARE_PROVIDER_SITE_OTHER): Payer: Medicare PPO | Admitting: Family Medicine

## 2012-10-27 ENCOUNTER — Encounter: Payer: Self-pay | Admitting: Family Medicine

## 2012-10-27 VITALS — BP 121/78 | HR 60 | Ht 73.0 in | Wt 212.9 lb

## 2012-10-27 DIAGNOSIS — E785 Hyperlipidemia, unspecified: Secondary | ICD-10-CM

## 2012-10-27 DIAGNOSIS — E119 Type 2 diabetes mellitus without complications: Secondary | ICD-10-CM

## 2012-10-27 DIAGNOSIS — I10 Essential (primary) hypertension: Secondary | ICD-10-CM

## 2012-10-27 MED ORDER — TRAMADOL HCL 50 MG PO TABS: 50.0000 mg | ORAL_TABLET | Freq: Three times a day (TID) | ORAL | Status: DC | PRN

## 2012-10-27 MED ORDER — CHLORTHALIDONE 25 MG PO TABS: 25.0000 mg | ORAL_TABLET | Freq: Every day | ORAL | Status: DC

## 2012-10-27 MED ORDER — ENALAPRIL MALEATE 20 MG PO TABS: 40.0000 mg | ORAL_TABLET | Freq: Every day | ORAL | Status: DC

## 2012-10-27 MED ORDER — TRAZODONE HCL 100 MG PO TABS: 100.0000 mg | ORAL_TABLET | Freq: Every day | ORAL | Status: DC

## 2012-10-27 MED ORDER — METFORMIN HCL 1000 MG PO TABS: 1000.0000 mg | ORAL_TABLET | Freq: Two times a day (BID) | ORAL | Status: DC

## 2012-10-27 MED ORDER — SPIRONOLACTONE 25 MG PO TABS: 25.0000 mg | ORAL_TABLET | Freq: Every day | ORAL | Status: DC

## 2012-10-27 MED ORDER — AMLODIPINE BESYLATE 10 MG PO TABS: 10.0000 mg | ORAL_TABLET | Freq: Every day | ORAL | Status: DC

## 2012-10-27 MED ORDER — LOVASTATIN 40 MG PO TABS: 40.0000 mg | ORAL_TABLET | Freq: Every day | ORAL | Status: DC

## 2012-10-27 NOTE — Patient Instructions (Signed)
Thank you for coming in today, it was good to see you Your blood pressure looks great today! Good job with weight loss.   Schedule your eye exam and colonoscopy.  Follow up in three months

## 2012-10-30 NOTE — Assessment & Plan Note (Signed)
He is well controlled on current regimen with switching to chlorthalidone and addition of aldactone.  Continue current medications.

## 2012-10-30 NOTE — Progress Notes (Signed)
  Subjective:    Patient ID: Andrew [name], male    DOB: Feb 05, 1956, 57 y.o.   MRN: [id number]  HPI 1. CHRONIC DIABETES  Disease Monitoring  Blood Sugar Ranges: 120-140k  Polyuria: no   Visual problems: no   Medication Compliance: no  Medication Side Effects  Hypoglycemia: no   Preventitive Health Care  Eye Exam: Plans to have soon  Foot Exam: Today, see foot exam section of EMR  Diet pattern: Generally healthy, consistent meals  Exercise: Walking frequently   2. CHRONIC HYPERTENSION  Disease Monitoring  Blood pressure range: No self monitoring at home  Chest pain: no   Dyspnea: no   Claudication: no   Medication compliance: yes  Medication Side Effects  Lightheadedness: no   Urinary frequency: no   Edema: no   Impotence: no       Review of Systems Per HPI    Objective:   Physical Exam  Constitutional: He appears well-nourished. No distress.  HENT:  Head: Normocephalic and atraumatic.  Cardiovascular: Normal rate and regular rhythm.   Pulmonary/Chest: Effort normal and breath sounds normal.  Musculoskeletal: He exhibits no edema.          Assessment & Plan:

## 2012-10-30 NOTE — Assessment & Plan Note (Signed)
Well controlled, no changes to current medications.  Foot exam done today.  Instructed to get eye exam.

## 2013-01-13 ENCOUNTER — Encounter: Payer: Self-pay | Admitting: Family Medicine

## 2013-01-13 ENCOUNTER — Ambulatory Visit (INDEPENDENT_AMBULATORY_CARE_PROVIDER_SITE_OTHER): Payer: Medicare PPO | Admitting: Family Medicine

## 2013-01-13 VITALS — BP 131/78 | HR 67 | Temp 99.1°F | Wt 211.0 lb

## 2013-01-13 DIAGNOSIS — K5900 Constipation, unspecified: Secondary | ICD-10-CM | POA: Insufficient documentation

## 2013-01-13 NOTE — Assessment & Plan Note (Signed)
Constipation due to tramadol use. Differential includes ileus and gastroparesis, but neither likely. Encouraged to use stool softener, suppositories and enema if necessary. Pt is agreeable to this plan. Will f/u if develops nausea and vomiting, fever, worsening pain.

## 2013-01-13 NOTE — Patient Instructions (Signed)
I think that this is related to constipation. Here are your options for treatment.   1. Miralax - at least 2 scoops per day 2. Glycerin suppository 3. Enema - Fleets (only use one per day)  Come back if not improved at the end of the week. Once this has passed, your pain should decrease and we need to get the colonoscopy.   Sincerely,   Dr. Clinton Sawyer

## 2013-01-13 NOTE — Progress Notes (Signed)
  Subjective:    Patient ID: Andrew [name], male    DOB: 07-Dec-1955, 57 y.o.   MRN: [id number]  HPI  57 year old M with DM type 2, HTN and back pain who presents for abdominal pain and constipation.   Symptoms: cramping abdominal pain left sided, has not had regular bowel movement since last Thursday which he attributes to taking tramadol for back pain; Bowel movements minimal with firm pellets, still passing gas, denies nausea and vomiting, fever, chills  Patient with history of hernia repairs but denies any history of obstruction or ileus   Past Surgical History  Procedure Laterality Date  . Transthoracic echocardiogram  2005    EF 60%  . Ercp  2006    Normal  . Hernia repair  1985, 1995    x2  . Lipoma excision  06/22/2011    Procedure: EXCISION LIPOMA;  Surgeon: Emelia Loron, MD;  Location: [facility];  Service: General;  Laterality: N/A;     Review of Systems See HPI    Objective:   Physical Exam BP 131/78  Pulse 67  Temp(Src) 99.1 F (37.3 C) (Oral)  Wt 211 lb (95.709 kg)  BMI 27.84 kg/m2  Well appearing Hypoactive bowel sounds Mild TTP LLQ Mild distention Rectum without obstruction, no stool in rectal vault       Assessment & Plan:

## 2013-01-15 ENCOUNTER — Other Ambulatory Visit: Payer: Self-pay

## 2013-01-15 ENCOUNTER — Encounter: Payer: Self-pay | Admitting: Internal Medicine

## 2013-02-09 ENCOUNTER — Telehealth: Payer: Self-pay | Admitting: Family Medicine

## 2013-02-09 DIAGNOSIS — E119 Type 2 diabetes mellitus without complications: Secondary | ICD-10-CM

## 2013-02-09 NOTE — Telephone Encounter (Signed)
Patient is coming for A1C tomorrow and needs an order.  Also he needs a form filled out for the SCAT bus.

## 2013-02-09 NOTE — Telephone Encounter (Signed)
Order placed for A1C. Form for scat can be completed by myself or preceptor tomorrow.

## 2013-02-10 ENCOUNTER — Other Ambulatory Visit (INDEPENDENT_AMBULATORY_CARE_PROVIDER_SITE_OTHER): Payer: Medicare PPO

## 2013-02-10 DIAGNOSIS — E119 Type 2 diabetes mellitus without complications: Secondary | ICD-10-CM

## 2013-02-12 ENCOUNTER — Encounter: Payer: Self-pay | Admitting: Family Medicine

## 2013-02-12 ENCOUNTER — Ambulatory Visit (INDEPENDENT_AMBULATORY_CARE_PROVIDER_SITE_OTHER): Payer: Medicare PPO | Admitting: Family Medicine

## 2013-02-12 VITALS — BP 121/78 | HR 66 | Ht 73.0 in | Wt 211.0 lb

## 2013-02-12 DIAGNOSIS — M25551 Pain in right hip: Secondary | ICD-10-CM

## 2013-02-12 DIAGNOSIS — M549 Dorsalgia, unspecified: Secondary | ICD-10-CM

## 2013-02-12 DIAGNOSIS — M25559 Pain in unspecified hip: Secondary | ICD-10-CM

## 2013-02-12 MED ORDER — BACLOFEN 5 MG HALF TABLET: 5.0000 mg | ORAL_TABLET | Freq: Three times a day (TID) | ORAL | Status: DC

## 2013-02-12 NOTE — Patient Instructions (Addendum)
It was nice to meet you today!  I am giving you a prescription for a muscle relaxer, called baclofen, to take three times daily as needed. I am also referring you to physical therapy. Use caution with the medicine because it might make you sleepy.  Please schedule a follow up appointment with Dr. Clinton Sawyer within 2 weeks to follow up.  Be well, Dr. Pollie Meyer
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
## 2013-02-16 ENCOUNTER — Encounter: Payer: Self-pay | Admitting: Family Medicine

## 2013-02-23 ENCOUNTER — Ambulatory Visit: Payer: Medicare PPO | Attending: Family Medicine | Admitting: Physical Therapy

## 2013-02-23 DIAGNOSIS — IMO0001 Reserved for inherently not codable concepts without codable children: Secondary | ICD-10-CM | POA: Insufficient documentation

## 2013-02-23 DIAGNOSIS — M2560 Stiffness of unspecified joint, not elsewhere classified: Secondary | ICD-10-CM | POA: Insufficient documentation

## 2013-02-23 DIAGNOSIS — M545 Low back pain, unspecified: Secondary | ICD-10-CM | POA: Insufficient documentation

## 2013-02-23 DIAGNOSIS — Z9181 History of falling: Secondary | ICD-10-CM | POA: Insufficient documentation

## 2013-02-23 DIAGNOSIS — R269 Unspecified abnormalities of gait and mobility: Secondary | ICD-10-CM | POA: Insufficient documentation

## 2013-02-23 DIAGNOSIS — M25559 Pain in unspecified hip: Secondary | ICD-10-CM | POA: Insufficient documentation

## 2013-03-02 ENCOUNTER — Ambulatory Visit: Payer: Medicare PPO | Admitting: Physical Therapy

## 2013-03-02 ENCOUNTER — Ambulatory Visit: Payer: Medicare PPO | Admitting: Family Medicine

## 2013-03-04 ENCOUNTER — Ambulatory Visit: Payer: Medicare PPO | Admitting: Rehabilitation

## 2013-03-05 ENCOUNTER — Ambulatory Visit: Payer: Medicare PPO | Admitting: Rehabilitation

## 2013-03-10 ENCOUNTER — Ambulatory Visit (AMBULATORY_SURGERY_CENTER): Payer: Medicare PPO

## 2013-03-10 ENCOUNTER — Encounter: Payer: Medicare PPO | Admitting: Rehabilitation

## 2013-03-10 VITALS — Ht 73.0 in | Wt 213.4 lb

## 2013-03-10 DIAGNOSIS — Z1211 Encounter for screening for malignant neoplasm of colon: Secondary | ICD-10-CM

## 2013-03-10 MED ORDER — NA SULFATE-K SULFATE-MG SULF 17.5-3.13-1.6 GM/177ML PO SOLN: 1.0000 | Freq: Once | ORAL | Status: DC

## 2013-03-10 NOTE — Progress Notes (Signed)
Pt came into the office today for his pre-visit prior to his colonoscopy with Dr.Pyrtle on 03/24/13.Patient states he had a colonoscopy done over 10 years ago with Dr Elnoria Howard (which was normal). He has signed a medical release form which will be given to [facility] ([facility])

## 2013-03-11 ENCOUNTER — Encounter: Payer: Self-pay | Admitting: Internal Medicine

## 2013-03-12 ENCOUNTER — Ambulatory Visit: Payer: Medicare PPO | Admitting: Physical Therapy

## 2013-03-12 ENCOUNTER — Encounter: Payer: Medicare PPO | Admitting: Physical Therapy

## 2013-03-16 ENCOUNTER — Ambulatory Visit: Payer: Medicare PPO | Admitting: Physical Therapy

## 2013-03-20 ENCOUNTER — Ambulatory Visit (INDEPENDENT_AMBULATORY_CARE_PROVIDER_SITE_OTHER): Payer: Medicare PPO | Admitting: *Deleted

## 2013-03-20 DIAGNOSIS — Z23 Encounter for immunization: Secondary | ICD-10-CM

## 2013-03-20 NOTE — Progress Notes (Signed)
Patient here today for nurse visit for flu vaccine.  Gaylene Brooks, RN

## 2013-03-24 ENCOUNTER — Ambulatory Visit (AMBULATORY_SURGERY_CENTER): Payer: Medicare PPO | Admitting: Internal Medicine

## 2013-03-24 ENCOUNTER — Encounter: Payer: Self-pay | Admitting: Internal Medicine

## 2013-03-24 VITALS — BP 127/56 | HR 60 | Temp 97.6°F | Resp 14 | Ht 73.0 in | Wt 213.0 lb

## 2013-03-24 DIAGNOSIS — D126 Benign neoplasm of colon, unspecified: Secondary | ICD-10-CM

## 2013-03-24 DIAGNOSIS — K5730 Diverticulosis of large intestine without perforation or abscess without bleeding: Secondary | ICD-10-CM

## 2013-03-24 DIAGNOSIS — Z1211 Encounter for screening for malignant neoplasm of colon: Secondary | ICD-10-CM

## 2013-03-24 HISTORY — PX: COLONOSCOPY: SHX174

## 2013-03-24 LAB — GLUCOSE, CAPILLARY: Glucose-Capillary: 128 mg/dL — ABNORMAL HIGH (ref 70–99)

## 2013-03-24 MED ORDER — SODIUM CHLORIDE 0.9 % IV SOLN: 500.0000 mL | INTRAVENOUS | Status: DC

## 2013-03-24 NOTE — Progress Notes (Signed)
Called to room to assist during endoscopic procedure.  Patient ID and intended procedure confirmed with present staff. Received instructions for my participation in the procedure from the performing physician.Called to room to assist during endoscopic procedure.  Patient ID and intended procedure confirmed with present staff. Received instructions for my participation in the procedure from the performing physician. 

## 2013-03-24 NOTE — Progress Notes (Signed)
Report to pacu, rn, vss, bbs=clear 

## 2013-03-24 NOTE — Op Note (Signed)
 [facility] [address] [place], [address]   COLONOSCOPY PROCEDURE REPORT  PATIENT: Andrew [name], Andrew [name]  MR#: [id number] BIRTHDATE: [birth year] , 57  yrs. old GENDER: Male ENDOSCOPIST: Iva Boop, MD, [facility] REFERRED BY:   Mat Carne, MD PROCEDURE DATE:  03/24/2013 PROCEDURE:   Colonoscopy with snare polypectomy First Screening Colonoscopy - Avg.  risk and is 50 yrs.  old or older Yes.  Prior Negative Screening - Now for repeat screening. N/A  History of Adenoma - Now for follow-up colonoscopy & has been > or = to 3 yrs.  N/A  Polyps Removed Today? Yes. ASA CLASS:   Class III INDICATIONS:average risk screening and first colonoscopy. MEDICATIONS: propofol (Diprivan) 250mg  IV, MAC sedation, administered by CRNA, and These medications were titrated to patient response per physician's verbal order  DESCRIPTION OF PROCEDURE:   After the risks benefits and alternatives of the procedure were thoroughly explained, informed consent was obtained.  A digital rectal exam revealed no abnormalities of the rectum and A digital rectal exam revealed the prostate was not enlarged.   The LB [id number]  endoscope was introduced through the anus and advanced to the cecum, which was identified by both the appendix and ileocecal valve. No adverse events experienced.   The quality of the prep was excellent using Suprep  The instrument was then slowly withdrawn as the colon was fully examined.    COLON FINDINGS: Two diminutive sessile polyps were found in the distal transverse colon.  A polypectomy was performed with a cold snare.  The resection was complete and the polyp tissue was completely retrieved.   There was moderate scattered diverticulosis noted throughout the entire examined colon.   The colon mucosa was otherwise normal.   A right colon retroflexion was performed. Retroflexed views revealed no abnormalities. The time to cecum=1 minutes 37 seconds.   Withdrawal time=8 minutes 03 seconds.  The scope was withdrawn and the procedure completed. COMPLICATIONS: There were no complications.  ENDOSCOPIC IMPRESSION: 1.   Two diminutive sessile polyps were found in the distal transverse colon; polypectomy was performed with a cold snare 2.   There was moderate diverticulosis noted throughout the entire examined colon 3.   The colon mucosa was otherwise normal - excellent prep - first colonoscopy  RECOMMENDATIONS: Timing of repeat colonoscopy will be determined by pathology findings.   eSigned:  Iva Boop, MD, [facility] 03/24/2013 10:19 AM  cc: The Patient  and Maryjean Ka, MD

## 2013-03-24 NOTE — Patient Instructions (Addendum)
I found and removed two tiny polyps that look benign.  You also have diverticulosis - a common condition that usually does not bother people.  I will let you know pathology results and when to have another routine colonoscopy by mail.  I appreciate the opportunity to care for you. Iva Boop, MD, FACG   YOU HAD AN ENDOSCOPIC PROCEDURE TODAY AT THE [facility]: Refer to the procedure report that was given to you for any specific questions about what was found during the examination.  If the procedure report does not answer your questions, please call your gastroenterologist to clarify.  If you requested that your care partner not be given the details of your procedure findings, then the procedure report has been included in a sealed envelope for you to review at your convenience later.  YOU SHOULD EXPECT: Some feelings of bloating in the abdomen. Passage of more gas than usual.  Walking can help get rid of the air that was put into your GI tract during the procedure and reduce the bloating. If you had a lower endoscopy (such as a colonoscopy or flexible sigmoidoscopy) you may notice spotting of blood in your stool or on the toilet paper. If you underwent a bowel prep for your procedure, then you may not have a normal bowel movement for a few days.  DIET: Your first meal following the procedure should be a light meal and then it is ok to progress to your normal diet.  A half-sandwich or bowl of soup is an example of a good first meal.  Heavy or fried foods are harder to digest and may make you feel nauseous or bloated.  Likewise meals heavy in dairy and vegetables can cause extra gas to form and this can also increase the bloating.  Drink plenty of fluids but you should avoid alcoholic beverages for 24 hours.  ACTIVITY: Your care partner should take you home directly after the procedure.  You should plan to take it easy, moving slowly for the rest of the day.  You can resume normal  activity the day after the procedure however you should NOT DRIVE or use heavy machinery for 24 hours (because of the sedation medicines used during the test).    SYMPTOMS TO REPORT IMMEDIATELY: A gastroenterologist can be reached at any hour.  During normal business hours, 8:30 AM to 5:00 PM Monday through Friday, call [phone].  After hours and on weekends, please call the GI answering service at [phone] who will take a message and have the physician on call contact you.   Following lower endoscopy (colonoscopy or flexible sigmoidoscopy):  Excessive amounts of blood in the stool  Significant tenderness or worsening of abdominal pains  Swelling of the abdomen that is new, acute  Fever of 100F or higher  FOLLOW UP: If any biopsies were taken you will be contacted by phone or by letter within the next 1-3 weeks.  Call your gastroenterologist if you have not heard about the biopsies in 3 weeks.  Our staff will call the home number listed on your records the next business day following your procedure to check on you and address any questions or concerns that you may have at that time regarding the information given to you following your procedure. This is a courtesy call and so if there is no answer at the home number and we have not heard from you through the emergency physician on call, we will assume that you have  returned to your regular daily activities without incident.  SIGNATURES/CONFIDENTIALITY: You and/or your care partner have signed paperwork which will be entered into your electronic medical record.  These signatures attest to the fact that that the information above on your After Visit Summary has been reviewed and is understood.  Full responsibility of the confidentiality of this discharge information lies with you and/or your care-partner.

## 2013-03-24 NOTE — Progress Notes (Addendum)
Patient did not have preoperative order for IV antibiotic SSI prophylaxis. (G8918)  Patient did not experience any of the following events: a burn prior to discharge; a fall within the facility; wrong site/side/patient/procedure/implant event; or a hospital transfer or hospital admission upon discharge from the facility. (G8907)  

## 2013-03-25 ENCOUNTER — Telehealth: Payer: Self-pay | Admitting: *Deleted

## 2013-03-25 NOTE — Telephone Encounter (Signed)
  Follow up Call-  Call back number 03/24/2013  Post procedure Call Back phone  # [phone]  Permission to leave phone message Yes     Patient questions:  Do you have a fever, pain , or abdominal swelling? no Pain Score  0 *  Have you tolerated food without any problems? yes  Have you been able to return to your normal activities? yes  Do you have any questions about your discharge instructions: Diet   no Medications  no Follow up visit  no  Do you have questions or concerns about your Care? no  Actions: * If pain score is 4 or above: No action needed, pain <4.

## 2013-03-27 ENCOUNTER — Ambulatory Visit: Payer: Medicare PPO | Admitting: Family Medicine

## 2013-04-01 ENCOUNTER — Encounter: Payer: Self-pay | Admitting: Internal Medicine

## 2013-04-01 DIAGNOSIS — Z860100 Personal history of colon polyps, unspecified: Secondary | ICD-10-CM

## 2013-04-01 HISTORY — DX: Personal history of colonic polyps: Z86.010

## 2013-04-01 HISTORY — DX: Personal history of colon polyps, unspecified: Z86.0100

## 2013-04-01 NOTE — Progress Notes (Signed)
Quick Note:  2 diminutive adenomas Repeat colon 2019 ______

## 2013-05-15 ENCOUNTER — Emergency Department (HOSPITAL_COMMUNITY)
Admission: EM | Admit: 2013-05-15 | Discharge: 2013-05-16 | Disposition: A | Discharge status: Home / Self Care | Payer: Medicare PPO | Attending: Emergency Medicine | Admitting: Emergency Medicine

## 2013-05-15 ENCOUNTER — Encounter (HOSPITAL_COMMUNITY): Payer: Self-pay | Admitting: Emergency Medicine

## 2013-05-15 DIAGNOSIS — Z7982 Long term (current) use of aspirin: Secondary | ICD-10-CM | POA: Insufficient documentation

## 2013-05-15 DIAGNOSIS — Z8719 Personal history of other diseases of the digestive system: Secondary | ICD-10-CM | POA: Insufficient documentation

## 2013-05-15 DIAGNOSIS — M79609 Pain in unspecified limb: Secondary | ICD-10-CM | POA: Insufficient documentation

## 2013-05-15 DIAGNOSIS — M129 Arthropathy, unspecified: Secondary | ICD-10-CM | POA: Insufficient documentation

## 2013-05-15 DIAGNOSIS — N182 Chronic kidney disease, stage 2 (mild): Secondary | ICD-10-CM | POA: Insufficient documentation

## 2013-05-15 DIAGNOSIS — M79604 Pain in right leg: Secondary | ICD-10-CM

## 2013-05-15 DIAGNOSIS — I129 Hypertensive chronic kidney disease with stage 1 through stage 4 chronic kidney disease, or unspecified chronic kidney disease: Secondary | ICD-10-CM | POA: Insufficient documentation

## 2013-05-15 DIAGNOSIS — E119 Type 2 diabetes mellitus without complications: Secondary | ICD-10-CM | POA: Insufficient documentation

## 2013-05-15 DIAGNOSIS — Z79899 Other long term (current) drug therapy: Secondary | ICD-10-CM | POA: Insufficient documentation

## 2013-05-15 DIAGNOSIS — Z872 Personal history of diseases of the skin and subcutaneous tissue: Secondary | ICD-10-CM | POA: Insufficient documentation

## 2013-05-15 DIAGNOSIS — E785 Hyperlipidemia, unspecified: Secondary | ICD-10-CM | POA: Insufficient documentation

## 2013-05-15 DIAGNOSIS — Z8673 Personal history of transient ischemic attack (TIA), and cerebral infarction without residual deficits: Secondary | ICD-10-CM | POA: Insufficient documentation

## 2013-05-15 DIAGNOSIS — Z880 Allergy status to penicillin: Secondary | ICD-10-CM | POA: Insufficient documentation

## 2013-05-15 MED ORDER — OXYCODONE-ACETAMINOPHEN 5-325 MG PO TABS
2.0000 | ORAL_TABLET | Freq: Once | ORAL | Status: AC
  Administered 2013-05-16: 2 via ORAL
  Filled 2013-05-15: qty 2

## 2013-05-15 NOTE — ED Provider Notes (Signed)
CSN: [id number]     Arrival date & time 05/15/13  2321 History   First MD Initiated Contact with Patient 05/15/13 2341     Chief Complaint  Patient presents with  . Leg Pain   (Consider location/radiation/quality/duration/timing/severity/associated sxs/prior Treatment) HPI Comments: Pt is a 57 y/o male who started having pain in his R leg 1 month ago - this has been gradual in onset, gradually worsening, comes in waves and is intermittent in nature - seems to get worse when he moves his leg and now has to walk with a cane for the first time ever.   Denies injjry, travel, immobilization, cancer or swelling / redness. No SOB or CP.  He has no dysuria.  According to the clinic notes - he was seen in August for same pain and told had muscle strain - referred to PT and started using cane.  No w/u for source done at that time.  He had been using tramadol TID for this pain at that time.  Patient is a 57 y.o. male presenting with leg pain. The history is provided by the patient.  Leg Pain   Past Medical History  Diagnosis Date  . CRD (chronic renal disease), stage II     Baseline Cr 1.2  . Prostatitis     hx of x2  . HTN (hypertension)   . HLD (hyperlipidemia)   . T2DM (type 2 diabetes mellitus)   . 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 Sarcoidosis   . Arthritis   . Leg swelling   . HAMMER TOE 07/25/2010  . Lipoma of back 03/18/2011  . 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 Personal history of colonic adenomas 04/01/2013   Past Surgical History  Procedure Laterality Date  . Transthoracic echocardiogram  2005    EF 60%  . Ercp  2006    Normal  . Hernia repair  1985, 1995    x2 'inguinal  . Lipoma excision  06/22/2011    Procedure: EXCISION LIPOMA;  Surgeon: Emelia Loron, MD;  Location: [facility];  Service: General;  Laterality: N/A;   Family History  Problem  Relation Age of Onset  . Bone cancer Father   . Cancer Father 57    bone  . Uterine cancer Mother   . Heart attack Brother   . Kidney disease Brother   . Heart disease Sister   . Colon cancer Neg Hx   . Rectal cancer Neg Hx   . Stomach cancer Neg Hx    History  Substance Use Topics  . Smoking status: Never Smoker   . Smokeless tobacco: Never Used  . Alcohol Use: Yes     Comment: beer on the weekend    Review of Systems  All other systems reviewed and are negative.    Allergies  Metoprolol succinate and Penicillins  Home Medications   Current Outpatient Rx  Name  Route  Sig  Dispense  Refill  . amLODipine (NORVASC) 10 MG tablet   Oral   Take 1 tablet (10 mg total) by mouth daily.   90 tablet   1   . aspirin 81 MG chewable tablet   Oral   Chew 81 mg by mouth daily.         . baclofen (LIORESAL) 10 MG tablet   Oral   Take 10 mg  by mouth daily.         . chlorthalidone (HYGROTON) 25 MG tablet   Oral   Take 1 tablet (25 mg total) by mouth daily.   90 tablet   1   . enalapril (VASOTEC) 20 MG tablet   Oral   Take 2 tablets (40 mg total) by mouth daily.   180 tablet   1   . lovastatin (MEVACOR) 40 MG tablet   Oral   Take 1 tablet (40 mg total) by mouth at bedtime.   90 tablet   1   . metFORMIN (GLUCOPHAGE) 1000 MG tablet   Oral   Take 1 tablet (1,000 mg total) by mouth 2 (two) times daily with a meal.   180 tablet   1   . spironolactone (ALDACTONE) 25 MG tablet   Oral   Take 1 tablet (25 mg total) by mouth daily.   90 tablet   1   . tadalafil (CIALIS) 10 MG tablet   Oral   Take 1 tablet (10 mg total) by mouth daily as needed for erectile dysfunction.   5 tablet   3   . traMADol (ULTRAM) 50 MG tablet   Oral   Take 1-2 tablets (50-100 mg total) by mouth every 8 (eight) hours as needed for pain.   180 tablet   1   . traZODone (DESYREL) 100 MG tablet   Oral   Take 1 tablet (100 mg total) by mouth at bedtime.   90 tablet   1   .  Blood Glucose Monitoring Suppl (BLOOD GLUCOSE METER) kit      Use to test fasting glucose daily.  Diagnosis Type II diabetes 250.0   1 each   0   . Elastic Bandages & Supports (KNEE BRACE) MISC      Please provide open patella knee compression brace for L and R knees  Dx: Bilateral knee arthritis 715.96   2 each   0   . Glucose Blood (BLOOD GLUCOSE TEST STRIPS) STRP      Use to test fasting blood glucose daily. Diagnosis: Type II diabetes 250.0   200 each   3   . HYDROcodone-acetaminophen (NORCO/VICODIN) 5-325 MG per tablet   Oral   Take 2 tablets by mouth every 4 (four) hours as needed.   10 tablet   0   . PRODIGY LANCETS 26G MISC      Use to check fasting blood glucose each morning.  Diagnosis: Diabetes Mellitus Type II 250.00   100 each   12    BP 134/68  Pulse 73  Temp(Src) 97.7 F (36.5 C) (Oral)  Resp 18  Wt 216 lb 11.2 oz (98.294 kg)  SpO2 95% Physical Exam  Nursing note and vitals reviewed. Constitutional: He appears well-developed and well-nourished. No distress.  HENT:  Head: Normocephalic and atraumatic.  Eyes: Conjunctivae are normal. Right eye exhibits no discharge. Left eye exhibits no discharge. No scleral icterus.  Cardiovascular: Normal rate, regular rhythm, normal heart sounds and intact distal pulses.  Exam reveals no gallop and no friction rub.   No murmur heard. Pulmonary/Chest: Effort normal and breath sounds normal. No respiratory distress. He has no wheezes. He has no rales.  Abdominal: Soft. Bowel sounds are normal. He exhibits no distension and no mass. There is no tenderness.  Genitourinary:  Normal appearing scrotum and testicles.  Musculoskeletal: Normal range of motion. He exhibits no edema and no tenderness.  Supple joints of bilateral LE"s including hips, knees  and ankles - no pain with ROM of the joints, soft compartments without any palpable cords, edema or asymetry.  Mild pain with palpation to the medial RLE at the thigh to the  inguinal ligament - no masses and normal pules at the femoral vessels in the R inguinal region.  Neurological: He is alert. Coordination normal.  Skin: Skin is warm and dry. No rash noted. No erythema.  Psychiatric: He has a normal mood and affect. His behavior is normal.    ED Course  Procedures (including critical care time) Labs Review Labs Reviewed  CBC - Abnormal; Notable for the following:    RBC 4.21 (*)    HCT 38.1 (*)    All other components within normal limits  BASIC METABOLIC PANEL - Abnormal; Notable for the following:    Sodium 129 (*)    Glucose, Bld 130 (*)    Creatinine, Ser 1.55 (*)    GFR calc non Af Amer 48 (*)    GFR calc Af Amer 56 (*)    All other components within normal limits  D-DIMER, QUANTITATIVE   Imaging Review Dg Hip Complete Right  05/16/2013   CLINICAL DATA:  Right hip pain and groin pain  EXAM: RIGHT HIP - COMPLETE 2+ VIEW  COMPARISON:  None  FINDINGS: There is no evidence of hip fracture or dislocation. There is no evidence of arthropathy or other focal bone abnormality.  IMPRESSION: Negative.   Electronically Signed   By: Signa Kell M.D.   On: 05/16/2013 00:42    EKG Interpretation   None       MDM   1. Leg pain, right    Pt has no obvious abnormalities on exam - no signs of fractures, no hard signs of DVT but with persistent pain that is worsening over time will get xray of hip and d dimer to eval for arthritis / AVN or DVT.  He has no sx of PE.  Filed Vitals:   05/15/13 2330  BP: 134/68  Pulse: 73  Temp: 97.7 F (36.5 C)  Resp: 18   Xray and dimer normal.  Meds given in ED:  Medications  oxyCODONE-acetaminophen (PERCOCET/ROXICET) 5-325 MG per tablet 2 tablet (2 tablets Oral Given 05/16/13 0007)    New Prescriptions   HYDROCODONE-ACETAMINOPHEN (NORCO/VICODIN) 5-325 MG PER TABLET    Take 2 tablets by mouth every 4 (four) hours as needed.      Vida Roller, MD 05/16/13 [id number]

## 2013-05-15 NOTE — ED Notes (Signed)
The pt is c/o pain in his entire  Rt leg the inner surface of his rt thigh that extends upward into his rt groin.  No known injury

## 2013-05-16 ENCOUNTER — Emergency Department (HOSPITAL_COMMUNITY): Payer: Medicare PPO

## 2013-05-16 LAB — BASIC METABOLIC PANEL
BUN: 21 mg/dL (ref 6–23)
Calcium: 9.2 mg/dL (ref 8.4–10.5)
Chloride: 96 mEq/L (ref 96–112)
Creatinine, Ser: 1.55 mg/dL — ABNORMAL HIGH (ref 0.50–1.35)
GFR calc Af Amer: 56 mL/min — ABNORMAL LOW (ref >90)
GFR calc non Af Amer: 48 mL/min — ABNORMAL LOW (ref >90)
Glucose, Bld: 130 mg/dL — ABNORMAL HIGH (ref 70–99)
Sodium: 129 mEq/L — ABNORMAL LOW (ref 135–145)

## 2013-05-16 LAB — D-DIMER, QUANTITATIVE: D-Dimer, Quant: <0.27 ug/mL-FEU (ref 0.00–0.48)

## 2013-05-16 LAB — CBC
MCH: 31.1 pg (ref 26.0–34.0)
MCHC: 34.4 g/dL (ref 30.0–36.0)
Platelets: 215 10*3/uL (ref 150–400)
RBC: 4.21 MIL/uL — ABNORMAL LOW (ref 4.22–5.81)
RDW: 12.7 % (ref 11.5–15.5)
WBC: 6.6 10*3/uL (ref 4.0–10.5)

## 2013-05-16 MED ORDER — HYDROCODONE-ACETAMINOPHEN 5-325 MG PO TABS: 2.0000 | ORAL_TABLET | ORAL | Status: DC | PRN

## 2013-05-16 NOTE — ED Notes (Addendum)
Pt alert, NAD, calm, interactive, resps e/u, speaking in clear complete sentences, CMS intact, ROM decreased in R leg d/t pain, worse with movement and walking, hurts constantly. Walking with cane. Denies h/o gout, no h/o same, not seen for this before tonight/here, h/o arthritis and DJD. Sx onset beginning of last month (october).

## 2013-05-16 NOTE — ED Notes (Signed)
To x-ray

## 2013-05-21 ENCOUNTER — Ambulatory Visit (INDEPENDENT_AMBULATORY_CARE_PROVIDER_SITE_OTHER): Payer: Medicare PPO | Admitting: Family Medicine

## 2013-05-21 ENCOUNTER — Encounter: Payer: Self-pay | Admitting: Family Medicine

## 2013-05-21 VITALS — BP 111/64 | HR 84 | Temp 99.9°F | Ht 76.0 in | Wt 208.6 lb

## 2013-05-21 DIAGNOSIS — R413 Other amnesia: Secondary | ICD-10-CM | POA: Insufficient documentation

## 2013-05-21 DIAGNOSIS — M2550 Pain in unspecified joint: Secondary | ICD-10-CM

## 2013-05-21 DIAGNOSIS — N058 Unspecified nephritic syndrome with other morphologic changes: Secondary | ICD-10-CM

## 2013-05-21 DIAGNOSIS — E785 Hyperlipidemia, unspecified: Secondary | ICD-10-CM

## 2013-05-21 DIAGNOSIS — R21 Rash and other nonspecific skin eruption: Secondary | ICD-10-CM

## 2013-05-21 DIAGNOSIS — E1129 Type 2 diabetes mellitus with other diabetic kidney complication: Secondary | ICD-10-CM

## 2013-05-21 DIAGNOSIS — I10 Essential (primary) hypertension: Secondary | ICD-10-CM

## 2013-05-21 LAB — CBC
Hemoglobin: 13.9 g/dL (ref 13.0–17.0)
MCH: 31.4 pg (ref 26.0–34.0)
MCHC: 34.4 g/dL (ref 30.0–36.0)
Platelets: 264 10*3/uL (ref 150–400)
RBC: 4.42 MIL/uL (ref 4.22–5.81)
WBC: 4.9 10*3/uL (ref 4.0–10.5)

## 2013-05-21 LAB — COMPREHENSIVE METABOLIC PANEL
ALT: 19 U/L (ref 0–53)
Alkaline Phosphatase: 49 U/L (ref 39–117)
Creat: 2.33 mg/dL — ABNORMAL HIGH (ref 0.50–1.35)
Glucose, Bld: 127 mg/dL — ABNORMAL HIGH (ref 70–99)
Sodium: 131 mEq/L — ABNORMAL LOW (ref 135–145)
Total Bilirubin: 0.3 mg/dL (ref 0.3–1.2)
Total Protein: 7.9 g/dL (ref 6.0–8.3)

## 2013-05-21 LAB — LIPID PANEL
LDL Cholesterol: 61 mg/dL (ref 0–99)
Total CHOL/HDL Ratio: 3 Ratio
Triglycerides: 89 mg/dL (ref <150)
VLDL: 18 mg/dL (ref 0–40)

## 2013-05-21 LAB — POCT GLYCOSYLATED HEMOGLOBIN (HGB A1C): Hemoglobin A1C: 6.4

## 2013-05-21 MED ORDER — TRIAMCINOLONE ACETONIDE 0.1 % EX CREA: 1.0000 "application " | TOPICAL_CREAM | Freq: Two times a day (BID) | CUTANEOUS | Status: DC

## 2013-05-21 MED ORDER — TRAMADOL HCL 50 MG PO TABS: 50.0000 mg | ORAL_TABLET | Freq: Three times a day (TID) | ORAL | Status: DC | PRN

## 2013-05-21 NOTE — Assessment & Plan Note (Signed)
Well-controlled on enalapril, chlorthalidone, Aldactone, and amlodipine. Compliant with daily aspirin. Continue current meds, refills placed today. Monitor at regular visits and f/u as needed.

## 2013-05-21 NOTE — Assessment & Plan Note (Signed)
Multi-joint, wide-spread arthritis, most recently exacerbated in his right leg. Does seem to be reasonably controlled with tramadol and improved since recent ED visit. Refill mailed to [facility], today. Plan to f/u as needed.

## 2013-05-21 NOTE — Assessment & Plan Note (Signed)
Mild, appears self-limited in small patch to left lower chest wall, improved per pt report with previously prescribed triamcinolone. Refilled today. F/u as needed.

## 2013-05-21 NOTE — Assessment & Plan Note (Signed)
Chronic, "intermittent," per pt description, with reported hx of CVA. Will check TSH today and f/u as needed. Consider formal MMSE at next visit.

## 2013-05-21 NOTE — Patient Instructions (Signed)
Thank you for coming in, today!  For your diabetes, we'll check your A1c today. Start taking your metformin differently - take one HALF of one tablet, twice per day. We'll also check your liver and kidney function as well as your thyroid and cholesterol. I will call you or send you a letter with the results.  I will refill your meds as we discussed.  For the rest of your medications - no changes.  Come back to see me in about 3 months, or sooner if you need. Please feel free to call with any questions or concerns at any time, at [phone]. --Dr. Casper Harrison

## 2013-05-21 NOTE — Assessment & Plan Note (Signed)
A1c 6.4 today with some reports of what sound like hypoglycemic episodes. Plan to check CMP, lipid panel, and TSH today. Retinal images done today. Reduce metformin to 500 mg BID and recheck A1c in 3 months. Discussed regular foot care -- see foot exam, globally diminished sensation. F/u as needed.
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
## 2013-05-21 NOTE — Assessment & Plan Note (Signed)
Some leg pain, difficult to ascertain whether this is chronic or if there is a component of myalgia. Last lipid panel about 1 year ago, plan to recheck today as well as checking LFT's. Continue Mevacor 40 mg, will modify as needed. F/u in 3 months.

## 2013-05-22 LAB — TSH: TSH: 5.562 u[IU]/mL — ABNORMAL HIGH (ref 0.350–4.500)

## 2013-05-23 ENCOUNTER — Other Ambulatory Visit: Payer: Self-pay | Admitting: Family Medicine

## 2013-05-23 DIAGNOSIS — I10 Essential (primary) hypertension: Secondary | ICD-10-CM

## 2013-05-23 DIAGNOSIS — E039 Hypothyroidism, unspecified: Secondary | ICD-10-CM

## 2013-05-23 DIAGNOSIS — R7989 Other specified abnormal findings of blood chemistry: Secondary | ICD-10-CM

## 2013-05-23 DIAGNOSIS — E119 Type 2 diabetes mellitus without complications: Secondary | ICD-10-CM

## 2013-05-23 DIAGNOSIS — E785 Hyperlipidemia, unspecified: Secondary | ICD-10-CM

## 2013-05-23 MED ORDER — AMLODIPINE BESYLATE 10 MG PO TABS: 10.0000 mg | ORAL_TABLET | Freq: Every day | ORAL | Status: DC

## 2013-05-23 MED ORDER — SPIRONOLACTONE 25 MG PO TABS: 25.0000 mg | ORAL_TABLET | Freq: Every day | ORAL | Status: DC

## 2013-05-23 MED ORDER — ENALAPRIL MALEATE 20 MG PO TABS: 40.0000 mg | ORAL_TABLET | Freq: Every day | ORAL | Status: DC

## 2013-05-23 MED ORDER — CHLORTHALIDONE 25 MG PO TABS: 25.0000 mg | ORAL_TABLET | Freq: Every day | ORAL | Status: DC

## 2013-05-23 MED ORDER — LOVASTATIN 40 MG PO TABS: 40.0000 mg | ORAL_TABLET | Freq: Every day | ORAL | Status: DC

## 2013-05-23 MED ORDER — TRAZODONE HCL 100 MG PO TABS: 100.0000 mg | ORAL_TABLET | Freq: Every day | ORAL | Status: DC

## 2013-05-23 NOTE — Telephone Encounter (Signed)
Meds refilled, but did not refill metformin. Pt's Cr recently elevated -- will need to recheck to ensure this isn't continuing to rise. Pt also with elevated TSH, so will likely start Synthroid. Will discuss with patient.

## 2013-05-25 DIAGNOSIS — R7989 Other specified abnormal findings of blood chemistry: Secondary | ICD-10-CM | POA: Insufficient documentation

## 2013-05-25 DIAGNOSIS — E039 Hypothyroidism, unspecified: Secondary | ICD-10-CM | POA: Insufficient documentation

## 2013-05-25 MED ORDER — LEVOTHYROXINE SODIUM 75 MCG PO TABS: 75.0000 ug | ORAL_TABLET | Freq: Every day | ORAL | Status: DC

## 2013-05-25 NOTE — Telephone Encounter (Signed)
Late entry of note. Called pt to discuss results. Cr elevated and TSH elevated. Plan to recheck BMP to make sure Cr not continuing to rise. Hold metformin in the meantime. Will call pt with results of recheck and discuss any med adjustments. Will also start Synthroid and plan to recheck TSH about 6 months after starting. Pt voiced understanding and will plan to come in for lab draw tomorrow. --CMS

## 2013-05-26 ENCOUNTER — Other Ambulatory Visit: Payer: Medicare PPO

## 2013-05-28 ENCOUNTER — Other Ambulatory Visit: Payer: Commercial Managed Care - HMO

## 2013-05-28 DIAGNOSIS — R7989 Other specified abnormal findings of blood chemistry: Secondary | ICD-10-CM

## 2013-05-28 LAB — BASIC METABOLIC PANEL
CO2: 24 mEq/L (ref 19–32)
Calcium: 9.8 mg/dL (ref 8.4–10.5)
Creat: 2.15 mg/dL — ABNORMAL HIGH (ref 0.50–1.35)
Potassium: 4.1 mEq/L (ref 3.5–5.3)
Sodium: 131 mEq/L — ABNORMAL LOW (ref 135–145)

## 2013-05-28 NOTE — Progress Notes (Signed)
BMP DONE TODAY Andrew [name] 

## 2013-05-29 ENCOUNTER — Telehealth: Payer: Self-pay | Admitting: Family Medicine

## 2013-05-29 DIAGNOSIS — R7989 Other specified abnormal findings of blood chemistry: Secondary | ICD-10-CM

## 2013-05-29 DIAGNOSIS — M25561 Pain in right knee: Secondary | ICD-10-CM

## 2013-05-29 NOTE — Telephone Encounter (Signed)
Called pt to discuss lab results. Cr remains elevated but is improved from last check (2.33 > 2.15). Plan to continue to hold metformin, recheck BMP in two weeks, then consider restarting metformin vs re-evaluating if another medicine is more appropriate. If Cr remains elevated, my consider referral to nephrology. Pt also requested re-referral to PT for knee pain; pt has seen [facility] outpt rehab in the past. Referral placed. Plan to f/u as needed. Pt voiced understanding. --CMS

## 2013-06-17 ENCOUNTER — Encounter: Payer: Self-pay | Admitting: Family Medicine

## 2013-06-17 ENCOUNTER — Ambulatory Visit (INDEPENDENT_AMBULATORY_CARE_PROVIDER_SITE_OTHER): Payer: Medicare HMO | Admitting: Family Medicine

## 2013-06-17 VITALS — BP 121/77 | HR 60 | Temp 97.5°F | Ht 73.0 in | Wt 213.0 lb

## 2013-06-17 DIAGNOSIS — R7989 Other specified abnormal findings of blood chemistry: Secondary | ICD-10-CM

## 2013-06-17 DIAGNOSIS — N058 Unspecified nephritic syndrome with other morphologic changes: Secondary | ICD-10-CM

## 2013-06-17 DIAGNOSIS — E1129 Type 2 diabetes mellitus with other diabetic kidney complication: Secondary | ICD-10-CM

## 2013-06-17 DIAGNOSIS — R799 Abnormal finding of blood chemistry, unspecified: Secondary | ICD-10-CM

## 2013-06-17 DIAGNOSIS — M2550 Pain in unspecified joint: Secondary | ICD-10-CM

## 2013-06-17 LAB — COMPREHENSIVE METABOLIC PANEL
ALT: 61 U/L — ABNORMAL HIGH (ref 0–53)
AST: 34 U/L (ref 0–37)
Albumin: 4.3 g/dL (ref 3.5–5.2)
CO2: 27 mEq/L (ref 19–32)
Calcium: 9.6 mg/dL (ref 8.4–10.5)
Chloride: 96 mEq/L (ref 96–112)
Creat: 1.73 mg/dL — ABNORMAL HIGH (ref 0.50–1.35)
Potassium: 4.4 mEq/L (ref 3.5–5.3)
Total Protein: 7.6 g/dL (ref 6.0–8.3)

## 2013-06-17 MED ORDER — TRAMADOL HCL 50 MG PO TABS: 50.0000 mg | ORAL_TABLET | Freq: Three times a day (TID) | ORAL | Status: DC | PRN

## 2013-06-17 MED ORDER — BLOOD GLUCOSE TEST VI STRP: ORAL_STRIP | Status: DC

## 2013-06-17 NOTE — Progress Notes (Signed)
   Subjective:    Patient ID: Andrew [name], male    DOB: 28-May-1956, 57 y.o.   MRN: [id number]  HPI: Pt presents to clinic for follow up on diabetes and kidney function. He also wishes to discuss hip pain.  DM / CKD - Pt is off metformin due to elevated Cr at two recent check -has scheduled eye exam in the next few weeks -CBG's twice a day at home range 90 to 110 but has had some 150's-160's fasting -pt has never seen a kidney doctor but has a brother on dialysis -denies hypoglycemic symptoms but is very concerned to be off medicine -does not want to be on metformin at all, any more (his brother's kidneys were "affected by metformin, too")  Hip pain - right hip, long-standing arthritic pain, without radicular symptoms -helped with tramadol (150 mg TID, higher than prescribed dose) -exercising and stretching has helping (walking, mostly) -medication helps pt exercise more, per report  Review of Systems: As above. Does have some constipation; he takes OTC medications including fiber and Colace. Otherwise feels well.     Objective:   Physical Exam BP 121/77  Pulse 60  Temp(Src) 97.5 F (36.4 C) (Oral)  Ht 6\' 1"  (1.854 m)  Wt 213 lb (96.616 kg)  BMI 28.11 kg/m2 Gen: well-appearing adult male in NAD, very pleasant MSK: point tenderness over bony prominences of right hip, anteriorly and posteriorly  Increased pain with passive ROM on internal rotation on the right  Negative for pain on sitting straight leg-lift bilaterally  Antalgic gait, walks with cane but stands / walks / sits without assistance otherwise HEENT: [place]/AT, PERRLA, MMM, neck supple Cardio: RRR, no murmur appreciated Pulm: CTAB, no wheezes Abd: soft, nontender, BS+ Ext: warm, well-perfused, no LE edema     Assessment & Plan:  See problem list notes.

## 2013-06-17 NOTE — Assessment & Plan Note (Signed)
A: Last A1c 6.4 on metformin, recently stopped for persistently elevated Cr >2. Reports CBG's mostly around 100-110, with a few fasting numbers more like 160. Denies hypoglycemic episodes and does not want to restart metformin even if Cr has normalized.  P: Plan to recheck CMP today; see separate problem list note. May consider something like glyburide, depending on what A1c looks like at next check (sometime in February). Otherwise continue daily CBG's. Pt to f/u with optometrist.

## 2013-06-17 NOTE — Assessment & Plan Note (Signed)
Multi-joint, mostly in the right hip. Reasonably well-controlled with tramadol, though at high dose. Pt does request increase in Rx; sent in to mail-order pharmacy today. Will need to consider different agent (pt does not like other/stronger narcotics due to side effects), and may consider referral to pain clinic, depending on if high dose continues to help or not.

## 2013-06-17 NOTE — Patient Instructions (Signed)
Thank you for coming in, today!  We will recheck your kidney function today. For now, it's okay to stay off any medicine for the diabetes. We will recheck your A1c in February. If it is less than 8, you can continue to control your diabetes with just diet and exercise. In the meantime, keep checking your sugar twice a day. Don't make any changes to your medicine for now.  I sent in a prescription for your test strips and your tramadol. Make an appointment to come back to see me in February or sooner if you need. I will call you with your lab results. If your kidney numbers are still high, I will refer you to the kidney doctor.  Please feel free to call with any questions or concerns at any time, at [phone]. --Dr. Casper Harrison

## 2013-06-17 NOTE — Assessment & Plan Note (Signed)
A: Cr has been persistently above 2 on two recent checks, though was slowly trending down. Pt has increased H2O intake and has stopped metformin. Denies dysuria, hematuria, or change in urinary habits (urinating more frequently due to drinking more often).  P: Recheck CMP today. Discontinued metformin indefinitely (see recent phone notes). If Cr remains persistently elevated, will likely refer to nephrology; pt has a brother on dialysis and does not wish to have this, himself, ever, but nephrology may have some recommendations for chronic care apart from dialysis (good control of DM will help, regardless).

## 2013-06-19 ENCOUNTER — Telehealth: Payer: Self-pay | Admitting: Family Medicine

## 2013-06-19 NOTE — Telephone Encounter (Signed)
Called pt to discuss lab results. Kidney function is improved, though sodium is still only around 130, and there is a mild increase in AST. Will plan to repeat blood work at f/u, including A1c, to determine if pt needs to be referred to nephrology and if any medications need to be adjusted or restarted. Plan to stay off metformin, indefinitely.  --CMS

## 2013-07-13 ENCOUNTER — Ambulatory Visit: Payer: Medicare HMO

## 2013-07-21 ENCOUNTER — Telehealth: Payer: Self-pay | Admitting: *Deleted

## 2013-07-21 DIAGNOSIS — E1129 Type 2 diabetes mellitus with other diabetic kidney complication: Secondary | ICD-10-CM

## 2013-07-21 NOTE — Telephone Encounter (Signed)
Patient has been seeing Dr. Katy Fitch for his diabetic eye exams but with his new insurance [facility]) he will need a referral from his PCP. Will forward to PCP and referral coordinator.

## 2013-07-21 NOTE — Telephone Encounter (Signed)
Referral ordered, specifying Dr. Katy Fitch. Thanks! --CMS

## 2013-07-28 NOTE — Telephone Encounter (Signed)
Dr. Venetia Maxon has referral form too be complete. As soon I have the referral I will process it.   [place]

## 2013-07-28 NOTE — Telephone Encounter (Signed)
Form completed and given back to [place]. Please let me know if anything else needs to be done. Thanks! --CMS

## 2013-08-25 ENCOUNTER — Ambulatory Visit: Payer: Medicare HMO | Admitting: Family Medicine

## 2013-09-03 ENCOUNTER — Ambulatory Visit: Payer: Medicare HMO | Admitting: Family Medicine

## 2013-09-09 ENCOUNTER — Other Ambulatory Visit: Payer: Self-pay | Admitting: *Deleted

## 2013-09-11 ENCOUNTER — Other Ambulatory Visit: Payer: Self-pay | Admitting: Family Medicine

## 2013-09-14 ENCOUNTER — Ambulatory Visit: Payer: Medicare HMO | Admitting: Family Medicine

## 2013-09-17 ENCOUNTER — Encounter: Payer: Self-pay | Admitting: Family Medicine

## 2013-09-17 ENCOUNTER — Ambulatory Visit (INDEPENDENT_AMBULATORY_CARE_PROVIDER_SITE_OTHER): Payer: Medicare HMO | Admitting: Family Medicine

## 2013-09-17 VITALS — BP 144/77 | HR 67 | Temp 98.7°F | Wt 211.8 lb

## 2013-09-17 DIAGNOSIS — I10 Essential (primary) hypertension: Secondary | ICD-10-CM

## 2013-09-17 DIAGNOSIS — R7989 Other specified abnormal findings of blood chemistry: Secondary | ICD-10-CM

## 2013-09-17 DIAGNOSIS — E785 Hyperlipidemia, unspecified: Secondary | ICD-10-CM

## 2013-09-17 DIAGNOSIS — E1129 Type 2 diabetes mellitus with other diabetic kidney complication: Secondary | ICD-10-CM

## 2013-09-17 DIAGNOSIS — R799 Abnormal finding of blood chemistry, unspecified: Secondary | ICD-10-CM

## 2013-09-17 DIAGNOSIS — M2550 Pain in unspecified joint: Secondary | ICD-10-CM

## 2013-09-17 DIAGNOSIS — E039 Hypothyroidism, unspecified: Secondary | ICD-10-CM

## 2013-09-17 LAB — COMPREHENSIVE METABOLIC PANEL
ALBUMIN: 4 g/dL (ref 3.5–5.2)
ALT: 24 U/L (ref 0–53)
AST: 17 U/L (ref 0–37)
Alkaline Phosphatase: 61 U/L (ref 39–117)
BUN: 20 mg/dL (ref 6–23)
CHLORIDE: 97 meq/L (ref 96–112)
CO2: 27 mEq/L (ref 19–32)
Calcium: 9 mg/dL (ref 8.4–10.5)
Creat: 1.64 mg/dL — ABNORMAL HIGH (ref 0.50–1.35)
Glucose, Bld: 327 mg/dL — ABNORMAL HIGH (ref 70–99)
POTASSIUM: 4.5 meq/L (ref 3.5–5.3)
Sodium: 131 mEq/L — ABNORMAL LOW (ref 135–145)
TOTAL PROTEIN: 7.6 g/dL (ref 6.0–8.3)
Total Bilirubin: 0.3 mg/dL (ref 0.2–1.2)

## 2013-09-17 LAB — LIPID PANEL
CHOLESTEROL: 112 mg/dL (ref 0–200)
HDL: 41 mg/dL (ref >39)
LDL Cholesterol: 55 mg/dL (ref 0–99)
Total CHOL/HDL Ratio: 2.7 Ratio
Triglycerides: 82 mg/dL (ref <150)
VLDL: 16 mg/dL (ref 0–40)

## 2013-09-17 LAB — TSH: TSH: 0.028 u[IU]/mL — AB (ref 0.350–4.500)

## 2013-09-17 LAB — POCT GLYCOSYLATED HEMOGLOBIN (HGB A1C): Hemoglobin A1C: 8.9

## 2013-09-17 NOTE — Patient Instructions (Signed)
Thank you for coming in, today!  We will check several labs today. I will send you a letter or call with the results. If your kidney function is not better, then we might send you to the kidney doctor. If your A1c is high, then we'll need to think about a different medicine besides metformin for you.  Otherwise, call me if you need refills. Come back to see me in about 3 months.  Please feel free to call with any questions or concerns at any time, at [phone]. --Dr. Venetia Maxon

## 2013-09-17 NOTE — Assessment & Plan Note (Signed)
Compliant with Mevacor. Some chronic leg pain, but no frank / new myopathy. Lipid panel today shows HDL 41, LDL 55, TG 82. Continue statin, ASA, ACE. Repeat lipid panel in 3-6 months.

## 2013-09-17 NOTE — Assessment & Plan Note (Signed)
Mildly elevated today, but generally has been controlled with ACE, chlorthalidone, Aldactone, and Norvasc. Also on daily aspirin. Continue current meds, monitor at f/u. Likely pending referral to nephrology; see DM and elevated Cr problem list notes.

## 2013-09-17 NOTE — Assessment & Plan Note (Signed)
A: A1c 8.9, elevated from 6.4 at last check, since stopping metformin due to elevated Cr. Denies hypoglycemic episodes. Has seen optometrist and has no DM retinopathy.  P: Will call pt to discuss lab results and will likely start glyburide or other oral agent, but will continue to avoid metformin given Cr persistently >1.6 (though improved from previous). F/u in about 3 months for repeat A1c otherwise. Continue CBG checks at home.

## 2013-09-17 NOTE — Assessment & Plan Note (Signed)
Wide-spread, in multiple joints, mostly in right hip and leg / knee. Well-controlled with tramadol, no Rx needed today. Follow up as needed.

## 2013-09-17 NOTE — Progress Notes (Signed)
  Subjective:    Patient ID: Andrew [name], male    DOB: 1956/06/07, 58 y.o.   MRN: [id number]  HPI: Pt presents to clinic for general f/u.  Widespread arthritis - about at baseline - pt takes tramadol, usually 100-150 mg q8 hours as needed; does not need it every day - medication helps well and pt walks with cane - most recently has been having right hand and foot pain; tramadol helps with that as well - worse with cold weather, but in general  DM - has been off metformin for a few months because of increased creatinine - checks twice per day; has been "up and down," up to 200-210's every other day or so - denies hypoglycemic episodes  HTN - generally feels like he's been doing well - does not check at home. Reports compliance with meds. - specifically denies chest pain, headache, change in vision  HLD - reports compliance with lovastatin. Denies new body/muscle aches.  Hypothyroidism - reports compliance with Synthroid - denies weight loss, rash / skin / hair changes, change in bowel / bladder habits  Pt is a never smoker. In addition to the above documentation, pt's PMH, surgical history, FH, and SH all reviewed and updated where appropriate in the EMR. I have also reviewed and updated the pt's allergies and current medications as appropriate.  Review of Systems: As above. Otherwise, full 12-system ROS was reviewed and all negative.     Objective:   Physical Exam BP 144/77  Pulse 67  Temp(Src) 98.7 F (37.1 C) (Oral)  Wt 211 lb 12.8 oz (96.072 kg) Gen: well-appearing adult male in NAD HEENT: [place]/AT, sclerae/conjunctivae clear, no lid lag, EOMI, PERRLA   MMM, posterior oropharynx clear, no cervical lymphadenopathy  neck supple with full ROM, no masses appreciated; thyroid not enlarged  Cardio: RRR, no murmur appreciated; distal pulses intact/symmetric Pulm: CTAB, no wheezes, normal WOB  Abd: soft, nondistended, BS+, no HSM Ext: warm/well-perfused, no  cyanosis/clubbing/edema MSK: strength 5/5 in all four extremities, no frank joint deformity/effusion  Right knee pain > left with active ROM  Otherwise normal ROM to all four extremities  Diffuse soft tissue and bony tenderness in lumbar spine and bony prominences of posterior pelvis Neuro/Psych: alert/oriented, sensation grossly intact; normal gait/balance  Mood euthymic with congruent affect Skin: warm, dry, intact skin without rash     Assessment & Plan:  See problem list notes.

## 2013-09-17 NOTE — Assessment & Plan Note (Signed)
No new symptoms to suggest frank poor control of hypothyroidism. Compliant with Synthroid 75 mcg daily. Pending TSH, drawn today. May need increased dose. Will discuss TSH / dose adjustment if necessary with pt when I contact him to discuss other lab results. Otherwise f/u regularly, next TSH check in 6-12 months.

## 2013-09-17 NOTE — Assessment & Plan Note (Signed)
A: Improved but still elevated, after stopping metformin. Also with Na persistently around 130. Denies dysuria, hematuria, change in urinary habits.  P: Will discuss with pt by phone. Will likely refer to nephrology. Will need improved DM control, regardless.

## 2013-09-18 ENCOUNTER — Telehealth: Payer: Self-pay | Admitting: Family Medicine

## 2013-09-18 NOTE — Telephone Encounter (Signed)
Called pt to discuss lab results. Na remains mildly low and Cr has improved but still elevated, but now A1c has increased to 8.9. Left message instructing pt to return to clinic in 1-2 weeks to discuss likely referral to nephrology and probably starting medication for diabetes (did not leave specific numbers or plans / diagnoses on the message, but did state in the message that there is no specific immediate cause for concern). Will follow up with pt in person or by phone to discuss referral and possible med change. --CMS

## 2013-09-23 ENCOUNTER — Ambulatory Visit (INDEPENDENT_AMBULATORY_CARE_PROVIDER_SITE_OTHER): Payer: Medicare HMO | Admitting: Family Medicine

## 2013-09-23 ENCOUNTER — Encounter: Payer: Self-pay | Admitting: Family Medicine

## 2013-09-23 VITALS — BP 118/66 | HR 67 | Ht 73.0 in | Wt 212.0 lb

## 2013-09-23 DIAGNOSIS — H11153 Pinguecula, bilateral: Secondary | ICD-10-CM | POA: Insufficient documentation

## 2013-09-23 DIAGNOSIS — N183 Chronic kidney disease, stage 3 unspecified: Secondary | ICD-10-CM

## 2013-09-23 DIAGNOSIS — E871 Hypo-osmolality and hyponatremia: Secondary | ICD-10-CM

## 2013-09-23 DIAGNOSIS — E1129 Type 2 diabetes mellitus with other diabetic kidney complication: Secondary | ICD-10-CM

## 2013-09-23 MED ORDER — GLIPIZIDE 5 MG PO TABS: 5.0000 mg | ORAL_TABLET | Freq: Two times a day (BID) | ORAL | Status: DC

## 2013-09-23 NOTE — Progress Notes (Signed)
   Subjective:    Patient ID: Andrew [name], male    DOB: 12-01-1955, 58 y.o.   MRN: [id number]  HPI: Pt presents to clinic for discussion of recent lab results; see previous progress note from 3/12.  A1c elevated from 6.4 to 8.9. Na remains around 130. Cr down from >2, but still around 1.7. TSH mildly low. No significant change in symptoms. Generally feels well. Reports compliance with medications.  Review of Systems: As above.     Objective:   Physical Exam BP 118/66  Pulse 67  Ht 6\' 1"  (1.854 m)  Wt 212 lb (96.163 kg)  BMI 27.98 kg/m2 Gen: well-appearing adult male in NAD     Assessment & Plan:  Plan to start glipizide 5 mg BID for DM. Recheck A1c 3 months from last check. Found to be hyponatremic persistently with continued elevated Cr. Likely CKD related to DM. Continue Synthroid for now with recheck of TSH at next visit; consider reduce dose if TSH remains low. Referred to nephrology today (precepted with Dr. Adrian Blackwater). F/u with me in 2 months or as needed.

## 2013-09-23 NOTE — Patient Instructions (Signed)
Thank you for coming in, today!  For your diabetes: I want to start a medicine called glipizide. It will help lower your blood glucose but it won't hurt your kidneys like metformin can.  For you kidney function and low sodium level: I will refer you to the nephrologist today. Managing your blood pressure and diabetes well with help your kidney function remain good for as long as possible. It may be a few weeks before they can see you. If you don't hear from [place] or them in 2 weeks, give me a call.  Come back to see me in 2 months, or sooner if you need. We'll recheck your A1c in May. Please feel free to call with any questions or concerns at any time, at [phone]. --Dr. Venetia Maxon

## 2013-11-04 ENCOUNTER — Telehealth: Payer: Self-pay | Admitting: Family Medicine

## 2013-11-04 NOTE — Telephone Encounter (Signed)
Pt says with his new medicine glipizide his blood sugar levels are dropping to 40. He eats a piece on candy to bring them up and now his feet  Are swelling. He was taken off metformin because it was messing with his kidneys Please advise

## 2013-11-04 NOTE — Telephone Encounter (Signed)
Requested pt to call and make an appt for tomorrow.  Pt agreeable.  [place]

## 2013-11-04 NOTE — Telephone Encounter (Signed)
Noted pt has an appointment with Dr. Maricela Bo, tomorrow. As PCP, would favor stopping glipizide and consider another agent vs holding until f/u with me and repeat A1c (due in May). If blood sugars are that low, he may be able to manage without medications, but he did have a rise in his A1c when stopping metformin, previously. Will wait to see Dr. Thea Gist assessment/plan; appreciate his evaluation. Will route this to him, as well. --CMS

## 2013-11-05 ENCOUNTER — Ambulatory Visit (INDEPENDENT_AMBULATORY_CARE_PROVIDER_SITE_OTHER): Payer: Commercial Managed Care - HMO | Admitting: Family Medicine

## 2013-11-05 ENCOUNTER — Encounter: Payer: Self-pay | Admitting: Family Medicine

## 2013-11-05 VITALS — BP 116/77 | HR 61 | Ht 73.0 in | Wt 212.9 lb

## 2013-11-05 DIAGNOSIS — E1129 Type 2 diabetes mellitus with other diabetic kidney complication: Secondary | ICD-10-CM

## 2013-11-05 DIAGNOSIS — M654 Radial styloid tenosynovitis [de Quervain]: Secondary | ICD-10-CM | POA: Insufficient documentation

## 2013-11-05 MED ORDER — GLIPIZIDE ER 2.5 MG PO TB24: 2.5000 mg | ORAL_TABLET | Freq: Every day | ORAL | Status: DC

## 2013-11-05 NOTE — Progress Notes (Signed)
   Subjective:    Patient ID: Andrew [name], male    DOB: [birth year], 58 y.o.   MRN: [id number]  HPI  1. Diabetes with hypoglycemia - recently started on glipizide 5 mg BID after A1C was 8.5 in March 2014, he endorses low blood sugar (lowest in the last week - 77 highest in last week - 200), the symptoms of fatigue and lightheadedness start at a CBG of 90  2. Wrist pain - 3 days duration, spontaneous onset without trauma, no swelling, no fever, exacerbated by lifting with right hand, has tried tramadol with mild relief but cannot use NSAIDS 2/ CKD   Review of Systems Positive for lightheadedness, weakness, wrist pain Negative for fever, chills, nausea, vomiting, joint swelling      Objective:   Physical Exam BP 116/77  Pulse 61  Ht 6\' 1"  (1.854 m)  Wt 212 lb 14.4 oz (96.571 kg)  BMI 28.09 kg/m2  Gen: well appearing middle aged AAM, pleasant and conversant HEENT: NCAT, PERRLA, EOMI, OP clear and moist Right wrist pain at [facility], positive Finklestein's test on right Skin: warm, dry,no rash Neuro: alert and oriented x 3, no focal deficits      Assessment & Plan:

## 2013-11-05 NOTE — Assessment & Plan Note (Signed)
A: new onset, over use injury P: counseled on conservative therapy with ice, aspercreme, and using a thumb spica cast with lifting; pt to avoid NSAIDS; f/u 2 weeks

## 2013-11-05 NOTE — Assessment & Plan Note (Signed)
A: pt having episodes of mild hypoglycemia but this is symptomatic and troubling to the patient P: decrease glipizide to 2.5 mg ER form, f/u 1-2 weeks with PCP

## 2013-11-05 NOTE — Patient Instructions (Signed)
[name],   Thank you for coming to clinic today. Please read below regarding the issues that we discussed.   1. Diabetes - Let's go to the 2.5 mg tablet that is once a day. I think this will work better. Please follow up with Dr. Venetia Maxon 2 weeks after starting this medicine.   2. Wrist pain - You have a condition call tenosynovitis, which is inflammation of the tendon of thumb. I suggest icing the area and wearing a brace called a thumb spica splint. We do not have any in the clinic today.   Please follow up in clinic in 1 month. Please call earlier if you have any questions or concerns.   Sincerely,   Dr. Purvis Sheffield Quervain's Disease [name] is a condition often seen in racquet sports where there is a soreness (inflammation) in the cord like structures (tendons) which attach muscle to bone on the thumb side of the wrist. There may be a tightening of the tissuesaround the tendons. This condition is often helped by giving up or modifying the activity which caused it. When conservative treatment does not help, surgery may be required. Conservative treatment could include changes in the activity which brought about the problem or made it worse. Anti-inflammatory medications and injections may be used to help decrease the inflammation and help with pain control. Your caregiver will help you determine which is best for you. DIAGNOSIS  Often the diagnosis (learning what is wrong) can be made by examination. Sometimes x-rays are required. HOME CARE INSTRUCTIONS   Apply ice to the sore area for 15-20 minutes, 03-04 times per day while awake. Put the ice in a plastic bag and place a towel between the bag of ice and your skin. This is especially helpful if it can be done after all activities involving the sore wrist.  Temporary splinting may help.  Only take over-the-counter or prescription medicines for pain, discomfort or fever as directed by your caregiver. SEEK MEDICAL CARE  IF:   Pain relief is not obtained with medications, or if you have increasing pain and seem to be getting worse rather than better. MAKE SURE YOU:   Understand these instructions.  Will watch your condition.  Will get help right away if you are not doing well or get worse. Document Released: 03/20/2001 Document Revised: 09/17/2011 Document Reviewed: 06/25/2005 [facility] Patient Information 2014 [place].

## 2013-12-03 ENCOUNTER — Ambulatory Visit: Payer: Commercial Managed Care - HMO | Admitting: Family Medicine

## 2013-12-16 ENCOUNTER — Encounter: Payer: Self-pay | Admitting: Family Medicine

## 2013-12-16 ENCOUNTER — Ambulatory Visit (INDEPENDENT_AMBULATORY_CARE_PROVIDER_SITE_OTHER): Payer: Commercial Managed Care - HMO | Admitting: Family Medicine

## 2013-12-16 VITALS — BP 117/76 | HR 56 | Temp 97.8°F | Ht 73.0 in | Wt 215.6 lb

## 2013-12-16 DIAGNOSIS — E785 Hyperlipidemia, unspecified: Secondary | ICD-10-CM

## 2013-12-16 DIAGNOSIS — R7989 Other specified abnormal findings of blood chemistry: Secondary | ICD-10-CM

## 2013-12-16 DIAGNOSIS — E039 Hypothyroidism, unspecified: Secondary | ICD-10-CM

## 2013-12-16 DIAGNOSIS — R799 Abnormal finding of blood chemistry, unspecified: Secondary | ICD-10-CM

## 2013-12-16 DIAGNOSIS — E1129 Type 2 diabetes mellitus with other diabetic kidney complication: Secondary | ICD-10-CM

## 2013-12-16 DIAGNOSIS — M2550 Pain in unspecified joint: Secondary | ICD-10-CM

## 2013-12-16 DIAGNOSIS — I10 Essential (primary) hypertension: Secondary | ICD-10-CM

## 2013-12-16 LAB — POCT GLYCOSYLATED HEMOGLOBIN (HGB A1C): Hemoglobin A1C: 8.5

## 2013-12-16 MED ORDER — TRAMADOL HCL 50 MG PO TABS: 50.0000 mg | ORAL_TABLET | Freq: Three times a day (TID) | ORAL | Status: DC | PRN

## 2013-12-16 NOTE — Assessment & Plan Note (Addendum)
A: Recently decreased glipizide to 2.5 mg XR, doing well with CBG's nadir at 110's, but some "tiredness" attributed to medication. Otherwise, feels he is doing well. Diabetic foot exam today with diminished sensation throughout toes. A1c slightly improved to 8.5, down from 8.9.  P: Continue glipizide XR 2.5 mg daily, for now. If continued issues or worse "hypo"glycemia, will consider changing medications. May consider pharmacy clinic referral or discussing with Dr. Valentina Lucks, otherwise. Counseled on good foot care. F/u in about 3 months.

## 2013-12-16 NOTE — Assessment & Plan Note (Signed)
Good control with ACE, chlorthalidone, Aldactone, and Norvasc, as well as taking a daily aspirin. Continue current meds / doses. Monitor BP routinely at follow-up. See also DM and elevated Cr problem list notes.

## 2013-12-16 NOTE — Assessment & Plan Note (Addendum)
Likely related to DM and HTN, now off metformin and with good BP control, without change in urinary habits. Recheck CMP, today. Consider nephrology referral in the future.

## 2013-12-16 NOTE — Assessment & Plan Note (Signed)
Widespread, multiple joints, mostly in knees, hip, and lumbar spine. Well-controlled with Ultram; continue current dose. New Rx printed and to be mailed to [place]. F/u as needed.

## 2013-12-16 NOTE — Assessment & Plan Note (Signed)
Compliant with Synthroid 75 mcg daily. No new symptoms to suggest poor control. Recent TSH was slightly low, but did not change dose at that time. Continue current dose of Synthroid for now. Repeat TSH today; if still low will consider reducing dose. Otherwise, likely repeat TSH in 6-12 months.

## 2013-12-16 NOTE — Assessment & Plan Note (Signed)
Compliant with Mevacor, ASA, and ACE. No new side effects or body aches. Repeat lipid panel and CMP drawn, today. Continue current meds. F/u in 3 months.

## 2013-12-16 NOTE — Progress Notes (Signed)
   Subjective:    Patient ID: Andrew [name], male    DOB: June 06, 1956, 58 y.o.   MRN: [id number]  HPI: Pt presents to clinic for f/u of DM, HTN, HLD, and arthritis.   Widespread arthritis - about at baseline, helped with pain medication and using cane - still taking tramadol, usually 100-150 mg q8 hours as needed; requests refill  DM - off metformin due to kidney function, compliant with glipizide XR 2.5 mg daily - checks CBG's at home, generally around 110's at lowest, but pt does feel "very tired" that he attributes to "low" CBG and medication - denies hypoglycemic episodes other than the above  HTN - does not check BP at home, reports compliance with Norvasc, enalapril, chlorthalidone, spironolactone - specifically denies chest pain, headache, change in vision   HLD - reports compliance with lovastatin. Denies new body/muscle aches.   Hypothyroidism - reports compliance with Synthroid  - denies weight loss, rashes, skin / hair changes, change in bowel / bladder habits   Pt is a never smoker.  In addition to the above documentation, pt's PMH, surgical history, FH, and SH all reviewed and updated where appropriate in the EMR.  I have also reviewed and updated the pt's allergies and current medications as appropriate.  Review of Systems: As above. Otherwise, full 12-system ROS was reviewed and all negative.      Objective:   Physical Exam BP 117/76  Pulse 56  Temp(Src) 97.8 F (36.6 C) (Oral)  Ht 6\' 1"  (1.854 m)  Wt 215 lb 9.6 oz (97.796 kg)  BMI 28.45 kg/m2 Gen: well-appearing adult male in NAD; walks with cane HEENT: [place]/AT, sclerae/conjunctivae clear, no lid lag, EOMI, PERRLA   MMM, posterior oropharynx clear, no cervical lymphadenopathy   neck supple with full ROM, no masses appreciated; thyroid not enlarged  Cardio: RRR, no murmur appreciated; distal pulses intact/symmetric  Pulm: CTAB, no wheezes, normal WOB  Abd: soft, nondistended, BS+, no HSM  Ext:  warm/well-perfused, no cyanosis/clubbing/edema  MSK: strength 5/5 in all four extremities, no frank joint deformity/effusion   Right knee pain > left with active ROM   Otherwise normal ROM to all four extremities   Diffuse soft tissue and bony tenderness throughotu lumbar spine and bony prominences of posterior pelvis  Neuro/Psych: alert/oriented, sensation grossly intact in extremities with diminished sensation in toes (see below)  Mood euthymic with congruent affect  Skin: warm, dry, intact skin without rash Diabetic foot exam: See EPIC Flowsheet  Grossly intact skin without lesions, breakdown, bleeding / drainage / ulceration  Some thickened toenails but no ingrown nails  Diminished sensation in all 5 toes bilaterally to monofilament testing     Assessment & Plan:  See problem list notes. Generally doing well.

## 2013-12-16 NOTE — Patient Instructions (Signed)
Thank you for coming in, today!  Everything looks okay. I will mail in your refill for tramadol. Keep taking your regular medicines. I will call you or send you a letter about your lab results. If your blood sugar drops to less than 90 frequently or if you can't tolerate the diabetes medicine, we can try to adjust it.  Come back to see me in about 3 months. Please feel free to call with any questions or concerns at any time, at [phone]. --Dr. Venetia Maxon

## 2013-12-17 ENCOUNTER — Encounter: Payer: Self-pay | Admitting: Family Medicine

## 2013-12-17 LAB — COMPREHENSIVE METABOLIC PANEL
ALBUMIN: 4.1 g/dL (ref 3.5–5.2)
ALT: 29 U/L (ref 0–53)
AST: 22 U/L (ref 0–37)
Alkaline Phosphatase: 62 U/L (ref 39–117)
BUN: 19 mg/dL (ref 6–23)
CO2: 27 mEq/L (ref 19–32)
Calcium: 8.9 mg/dL (ref 8.4–10.5)
Chloride: 98 mEq/L (ref 96–112)
Creat: 1.77 mg/dL — ABNORMAL HIGH (ref 0.50–1.35)
GLUCOSE: 177 mg/dL — AB (ref 70–99)
POTASSIUM: 4.2 meq/L (ref 3.5–5.3)
Sodium: 132 mEq/L — ABNORMAL LOW (ref 135–145)
TOTAL PROTEIN: 7.5 g/dL (ref 6.0–8.3)
Total Bilirubin: 0.3 mg/dL (ref 0.2–1.2)

## 2013-12-17 LAB — TSH: TSH: 0.978 u[IU]/mL (ref 0.350–4.500)

## 2013-12-23 ENCOUNTER — Other Ambulatory Visit: Payer: Self-pay | Admitting: Family Medicine

## 2013-12-24 ENCOUNTER — Telehealth: Payer: Self-pay | Admitting: Family Medicine

## 2013-12-24 ENCOUNTER — Telehealth: Payer: Self-pay | Admitting: *Deleted

## 2013-12-24 MED ORDER — TRAMADOL HCL 50 MG PO TABS: 50.0000 mg | ORAL_TABLET | Freq: Three times a day (TID) | ORAL | Status: DC | PRN

## 2013-12-24 NOTE — Telephone Encounter (Signed)
Pt informed that a new Rx for Tramadol will be mailed to [facility] for #240 per 30 days.  Original Rx for Tramadol #270 per 30 days, but a prior authorization was needed for that amount.  Pt stated understanding.  Derl Barrow, RN

## 2013-12-24 NOTE — Telephone Encounter (Signed)
Received fax from [facility]; tramadol Rx has limit of 240 per 30 days or will need prior auth. Will print and mail new Rx. Message routed to Candiss Norse, RN. Thanks! --CMS

## 2014-01-07 NOTE — Telephone Encounter (Signed)
Pt has letter from [facility] stating they did not get anything from [place] about his TRamadol. Please advise

## 2014-01-07 NOTE — Telephone Encounter (Signed)
[facility] called regarding pt's tramadol.  Per representative, pt owe a balance and will need to pay that balance before any medication will be mailed.  Spoke with pt to informed him of this; pt stated he was on the phone with [facility].  Derl Barrow, RN

## 2014-01-28 ENCOUNTER — Encounter: Payer: Self-pay | Admitting: Family Medicine

## 2014-01-28 ENCOUNTER — Ambulatory Visit (INDEPENDENT_AMBULATORY_CARE_PROVIDER_SITE_OTHER): Payer: Commercial Managed Care - HMO | Admitting: Family Medicine

## 2014-01-28 VITALS — BP 108/75 | HR 60 | Temp 98.0°F | Ht 73.0 in | Wt 212.0 lb

## 2014-01-28 DIAGNOSIS — K0889 Other specified disorders of teeth and supporting structures: Secondary | ICD-10-CM | POA: Insufficient documentation

## 2014-01-28 DIAGNOSIS — K047 Periapical abscess without sinus: Secondary | ICD-10-CM

## 2014-01-28 MED ORDER — CHLORHEXIDINE GLUCONATE 0.12 % MT SOLN: 5.0000 mL | Freq: Two times a day (BID) | OROMUCOSAL | Status: DC

## 2014-01-28 MED ORDER — METRONIDAZOLE 500 MG PO TABS: 500.0000 mg | ORAL_TABLET | Freq: Three times a day (TID) | ORAL | Status: DC

## 2014-01-28 MED ORDER — CLINDAMYCIN HCL 300 MG PO CAPS
300.0000 mg | ORAL_CAPSULE | Freq: Three times a day (TID) | ORAL | Status: DC
Start: 2014-01-28 — End: 2014-04-14

## 2014-01-28 NOTE — Progress Notes (Signed)
   Subjective:    Patient ID: Andrew [name], male    DOB: 10-06-1955, 58 y.o.   MRN: [id number]  HPI: Pt presents to clinic for SDA for concern over a dental abscess. He has had chronic dental infections for a long time and is in the process of getting money together to have several teeth pulled. He has had about 2 weeks of right lower jaw pain and swelling as well as left upper 'eye tooth' pain, with gum swelling and tenderness, and some facial tenderness. His pain is aching and sore in nature with some throbbing, only about a 3/10. He has tramadol at home that helps with the pain. He has had no fevers or chils, N/V, abdominal pain. He has had some "breaking out" of his lower face on the right that he put some alcohol on it, which didn't help. He has had no bleeding or drainage in his mouth ("yet," he states), but he does have bad taste in his mouth.  Review of Systems: As above.     Objective:   Physical Exam BP 108/75  Pulse 60  Temp(Src) 98 F (36.7 C) (Oral)  Ht 6\' 1"  (1.854 m)  Wt 212 lb (96.163 kg)  BMI 27.98 kg/m2 Gen: well but uncomfortable-appearing adult male in NAD HEENT: [place]/AT, EOMI, MMM, slightly swollen appearance of right lower face  No frank facial rash / redness appreciated  Mild shotty lymphadenopathy in anterior cervical chains, mildly tender  NUMEROUS broken, black / discolored teeth with overall extremely poor dentition  Most teeth tender to touch with tongue depressor  Few scattered areas of gingivae that are slightly swollen / red  No areas of frank ulceration, bleeding, or drainage noted Cardio: RRR, no murmur appreciated Pulm: CTAB, no wheezes Abd: soft, nontender, BS+     Assessment & Plan:

## 2014-01-28 NOTE — Patient Instructions (Signed)
Thank you for coming in, today!  You definitely have several teeth that look like they could be infected. Make sure you get to the dentist as soon as you're able.  I want you to take two antibiotics for 10 days. One is clindamycin, the other is metronidazole. Both are one tablet three times a day.  I will give you printed prescriptions and coupons for [facility]. If you can only afford one of them, get the clindamycin.  Come back to see me as you need. If your pain, swelling or other symptoms get worse, or if you have high fever over 100.3 that doesn't come down with Tylenol, or if you have bad vomiting, or start feeling ill all over, call or go to the emergency room right away. Please feel free to call with any questions or concerns at any time, at [phone]. --Dr. Venetia Maxon

## 2014-01-28 NOTE — Assessment & Plan Note (Signed)
A: Extremely poor dentition with marked gingival irritation / likely infection and noticeable facial swelling, but no fevers / chills to suggest frank facial cellulitis or disseminated infection, and no open / bleeding / draining areas in the mouth. Pt in the process of getting together money to see the dentist but feels like it will be September or later before he is able to completely finish with dental care.  P Strongly advised continued oral care at home as best pt is able, and provided Rx's for chlorhexidine rinse, clindamycin and metronidazole both TID for 10 days (pt allergic to PCN's and would prefer to avoid cephalosporins at this time). Gave [email or web address] coupons with instructions to fill clindamycin only if he cannot afford both medications. Reviewed red flags at length that would prompt immediate return to clinic or presentation to the emergency room. F/u as needed, otherwise.

## 2014-01-29 ENCOUNTER — Telehealth: Payer: Self-pay | Admitting: Family Medicine

## 2014-01-29 NOTE — Telephone Encounter (Signed)
Need referral to [facility] asap.  Need to call in to them to set up and contact patient back regarding appt.

## 2014-02-01 NOTE — Telephone Encounter (Signed)
Called patient and explained that [facility] accepts pre-surgical cancer, kidney, or heart patients. All referrals must come from the hospital if patient is admitted. Patient expressed understanding.

## 2014-02-26 ENCOUNTER — Telehealth: Payer: Self-pay | Admitting: *Deleted

## 2014-02-26 NOTE — Telephone Encounter (Signed)
Jeani Hawking from [facility] called stating they tried to schedule patient for an appointment but he refused.  Pt told them he was feeling better.  If you have questions for Jeani Hawking please give her a call at [phone].  Derl Barrow, RN

## 2014-03-01 ENCOUNTER — Other Ambulatory Visit: Payer: Self-pay | Admitting: Family Medicine

## 2014-04-13 ENCOUNTER — Ambulatory Visit: Payer: Commercial Managed Care - HMO | Admitting: Family Medicine

## 2014-04-14 ENCOUNTER — Encounter: Payer: Self-pay | Admitting: Family Medicine

## 2014-04-14 ENCOUNTER — Ambulatory Visit (INDEPENDENT_AMBULATORY_CARE_PROVIDER_SITE_OTHER): Payer: Commercial Managed Care - HMO | Admitting: Family Medicine

## 2014-04-14 VITALS — BP 122/77 | HR 74 | Temp 98.5°F | Ht 73.0 in | Wt 215.2 lb

## 2014-04-14 DIAGNOSIS — R748 Abnormal levels of other serum enzymes: Secondary | ICD-10-CM

## 2014-04-14 DIAGNOSIS — N189 Chronic kidney disease, unspecified: Secondary | ICD-10-CM

## 2014-04-14 DIAGNOSIS — Z23 Encounter for immunization: Secondary | ICD-10-CM

## 2014-04-14 DIAGNOSIS — E1122 Type 2 diabetes mellitus with diabetic chronic kidney disease: Secondary | ICD-10-CM

## 2014-04-14 DIAGNOSIS — E785 Hyperlipidemia, unspecified: Secondary | ICD-10-CM

## 2014-04-14 DIAGNOSIS — R7989 Other specified abnormal findings of blood chemistry: Secondary | ICD-10-CM

## 2014-04-14 DIAGNOSIS — E1169 Type 2 diabetes mellitus with other specified complication: Secondary | ICD-10-CM

## 2014-04-14 DIAGNOSIS — I10 Essential (primary) hypertension: Secondary | ICD-10-CM

## 2014-04-14 DIAGNOSIS — E039 Hypothyroidism, unspecified: Secondary | ICD-10-CM

## 2014-04-14 DIAGNOSIS — M2550 Pain in unspecified joint: Secondary | ICD-10-CM

## 2014-04-14 LAB — POCT GLYCOSYLATED HEMOGLOBIN (HGB A1C): Hemoglobin A1C: 7.7

## 2014-04-14 MED ORDER — TETANUS-DIPHTH-ACELL PERTUSSIS 5-2.5-18.5 LF-MCG/0.5 IM SUSP: 0.5000 mL | Freq: Once | INTRAMUSCULAR | Status: DC

## 2014-04-14 NOTE — Assessment & Plan Note (Signed)
Widespread in multiple joints, primarily left hip lately. Well-controlled with Ultram, typically worse in colder weather. No refill needed today. F/u as needed.

## 2014-04-14 NOTE — Progress Notes (Signed)
   Subjective:    Patient ID: Andrew [name], male    DOB: Jun 08, 1956, 58 y.o.   MRN: [id number]  HPI: Pt presents to clinic for f/u of DM, HTN, HLD. Also states his chronic arthritis has been worse with changes in the weather, but generally well-controlled with tramadol.  DM - remains off metformin due to kidney function, compliant with glipizide XR 2.5 mg daily - checks CBG's at home, rarely higher than 150 - denies hypoglycemic episodes; previously had reported some prior to going to glipizide once daily instead of BID  HTN - still does not check BP at home but reports compliance with Norvasc, enalapril, chlorthalidone, spironolactone - specifically denies chest pain, headache, change in vision   HLD - reports compliance with lovastatin. Denies new body/muscle aches.   Hypothyroidism - reports compliance with Synthroid  - continues to deny weight loss, rashes, skin / hair changes, change in bowel / bladder habits   Pt is a never smoker.  In addition to the above documentation, pt's PMH, surgical history, FH, and SH all reviewed and updated where appropriate in the EMR.  I have also reviewed and updated the pt's allergies and current medications as appropriate.  Review of Systems: As above. Otherwise, full 12-system ROS was reviewed and all negative.      Objective:   Physical Exam BP 122/77  Pulse 74  Temp(Src) 98.5 F (36.9 C) (Oral)  Ht 6\' 1"  (1.854 m)  Wt 215 lb 3.2 oz (97.614 kg)  BMI 28.40 kg/m2 Gen: well-appearing adult male in NAD HEENT: [place]/AT, sclerae/conjunctivae clear, no lid lag, EOMI, PERRLA   MMM, posterior oropharynx clear, no cervical lymphadenopathy   neck supple with full ROM, no masses appreciated; thyroid not enlarged  Cardio: RRR, no murmur appreciated; distal pulses intact/symmetric  Pulm: CTAB, no wheezes, normal WOB  Abd: soft, nondistended, BS+, no HSM  Ext: warm/well-perfused, no cyanosis/clubbing/edema  MSK: strength 5/5 in all four  extremities, no frank joint deformity/effusion   Normal ROM to all four extremities   Diffuse soft tissue and bony tenderness throughout lumbar spine and bony prominences of posterior pelvis   Worst tenderness / pain in bony prominences of left hip Neuro/Psych: alert/oriented, sensation grossly intact  Mood euthymic with congruent affect  Skin: warm, dry, intact skin without rash     Assessment & Plan:  See problem list notes.

## 2014-04-14 NOTE — Assessment & Plan Note (Signed)
Compliant with Synthroid 75 mcg daily, without any new symptoms. TSH last time was in normal range (about 4 months ago), rechecking today. Continue current Synthroid dose unless TSH is markedly out of target range and likely recheck TSH in another 6-12 months or so.

## 2014-04-14 NOTE — Assessment & Plan Note (Signed)
Related to DM and HTN, remains off metformin. BP and DM control good and improving respectively. Recheck CMP today. Consider nephrology referral in the future, if / as kidney function worsens.

## 2014-04-14 NOTE — Patient Instructions (Signed)
Thank you for coming in, today!  Everything looks good. Give me a call if you need any refills.  We will check several labs today. I will call you or send you a letter with the same results.  Come back to see me about  3 months, or sooner if you need. Please feel free to call with any questions or concerns at any time, at [phone]. --Dr. Venetia Maxon

## 2014-04-14 NOTE — Assessment & Plan Note (Signed)
Good BP control with enalapril 20 mg, chlorthalidone 25 mg, Aldactone 25 mg, and Norvasc 10 mg. Also on daily ASA. Continue current meds / doses. Monitor BP at f/u routinely.

## 2014-04-14 NOTE — Assessment & Plan Note (Signed)
A: Compliant with glipizide XR 2.5 mg daily, with no more hypoglycemic episodes like last visit. Feels well. A1c today improved from 8.5 to 7.7.  P: Continue glipizide XR 2.5 mg daily. Counseled on continued good diet and exercise habits. F/u in about 3 months.

## 2014-04-15 ENCOUNTER — Encounter: Payer: Self-pay | Admitting: Family Medicine

## 2014-04-15 LAB — TSH: TSH: 0.841 u[IU]/mL (ref 0.350–4.500)

## 2014-04-15 LAB — COMPREHENSIVE METABOLIC PANEL
ALK PHOS: 62 U/L (ref 39–117)
ALT: 32 U/L (ref 0–53)
AST: 26 U/L (ref 0–37)
Albumin: 4.1 g/dL (ref 3.5–5.2)
BILIRUBIN TOTAL: 0.3 mg/dL (ref 0.2–1.2)
BUN: 24 mg/dL — AB (ref 6–23)
CO2: 25 mEq/L (ref 19–32)
CREATININE: 1.74 mg/dL — AB (ref 0.50–1.35)
Calcium: 8.9 mg/dL (ref 8.4–10.5)
Chloride: 101 mEq/L (ref 96–112)
Glucose, Bld: 98 mg/dL (ref 70–99)
Potassium: 4 mEq/L (ref 3.5–5.3)
Sodium: 133 mEq/L — ABNORMAL LOW (ref 135–145)
Total Protein: 7.6 g/dL (ref 6.0–8.3)

## 2014-05-11 ENCOUNTER — Other Ambulatory Visit: Payer: Self-pay | Admitting: Family Medicine

## 2014-05-11 DIAGNOSIS — E039 Hypothyroidism, unspecified: Secondary | ICD-10-CM

## 2014-05-11 MED ORDER — SPIRONOLACTONE 25 MG PO TABS: ORAL_TABLET | ORAL | Status: DC

## 2014-05-11 MED ORDER — LOVASTATIN 40 MG PO TABS: ORAL_TABLET | ORAL | Status: DC

## 2014-05-11 MED ORDER — LEVOTHYROXINE SODIUM 75 MCG PO TABS: 75.0000 ug | ORAL_TABLET | Freq: Every day | ORAL | Status: DC

## 2014-06-07 ENCOUNTER — Ambulatory Visit (INDEPENDENT_AMBULATORY_CARE_PROVIDER_SITE_OTHER): Payer: Commercial Managed Care - HMO | Admitting: Family Medicine

## 2014-06-07 ENCOUNTER — Encounter: Payer: Self-pay | Admitting: Family Medicine

## 2014-06-07 VITALS — BP 117/76 | HR 91 | Temp 97.6°F | Wt 215.0 lb

## 2014-06-07 DIAGNOSIS — K0889 Other specified disorders of teeth and supporting structures: Secondary | ICD-10-CM

## 2014-06-07 DIAGNOSIS — K047 Periapical abscess without sinus: Secondary | ICD-10-CM

## 2014-06-07 DIAGNOSIS — K088 Other specified disorders of teeth and supporting structures: Secondary | ICD-10-CM

## 2014-06-07 MED ORDER — CLINDAMYCIN HCL 300 MG PO CAPS: 300.0000 mg | ORAL_CAPSULE | Freq: Three times a day (TID) | ORAL | Status: DC

## 2014-06-07 MED ORDER — METRONIDAZOLE 500 MG PO TABS: 500.0000 mg | ORAL_TABLET | Freq: Three times a day (TID) | ORAL | Status: DC

## 2014-06-07 MED ORDER — HYDROCODONE-ACETAMINOPHEN 5-325 MG PO TABS: 1.0000 | ORAL_TABLET | Freq: Four times a day (QID) | ORAL | Status: DC | PRN

## 2014-06-07 NOTE — Assessment & Plan Note (Signed)
Poor dentition and periodontal disease w/o tooth abscess - Continue Chlorhexidine rise - Rx for Clindamycin and Flagyl TID x 10 days - Return to previous dentist for eval and likely extraction - Norco # 15 given for pain

## 2014-06-07 NOTE — Progress Notes (Signed)
   Subjective:    Patient ID: Andrew [name], male    DOB: Feb 02, 1956, 58 y.o.   MRN: [id number]  Seen for Same day visit for   CC: tooth pain  He complains of right lower jaw tooth pain and swelling for several days. Recently treated with abx for tooth abscess now s/p extraction. He reports being told "all his teeth were bad and would need to come out." He denies and difficulty/pain swallowing or trouble breathing. Denies fevers or recent abx.   Review of Systems   See HPI for ROS. Objective:  BP 117/76 mmHg  Pulse 91  Temp(Src) 97.6 F (36.4 C) (Oral)  Wt 215 lb (97.523 kg)  General: NAD HEENT: Moderate erythema and swelling around right lower molar. Dental caries in most teeth. NO abscess noted Cardiac: RRR, normal heart sounds, no murmurs. 2+ radial and PT pulses bilaterally Respiratory: CTAB, normal effort     Assessment & Plan:  See Problem List Documentation

## 2014-06-07 NOTE — Patient Instructions (Signed)
It was great seeing you today.   1. You have another tooth infection. Please continue using your antibiotic mouth wash as previously prescribed 1. Take both antibiotics three times a day for 10 days 2. Call your dentist and schedule an visit  3. You can take Norco every 6 hours for pain; which should improve after 1-2 days of antibiotics. After that tylenol as needed   If you have any questions or concerns before then, please call the clinic at [phone].  Take Care,   Dr Phill Myron

## 2014-08-16 ENCOUNTER — Ambulatory Visit: Payer: Commercial Managed Care - HMO | Admitting: Family Medicine

## 2014-09-02 ENCOUNTER — Ambulatory Visit (INDEPENDENT_AMBULATORY_CARE_PROVIDER_SITE_OTHER): Payer: Commercial Managed Care - HMO | Admitting: Family Medicine

## 2014-09-02 ENCOUNTER — Encounter: Payer: Self-pay | Admitting: Family Medicine

## 2014-09-02 VITALS — BP 131/78 | HR 59 | Temp 98.1°F | Ht 73.0 in | Wt 212.0 lb

## 2014-09-02 DIAGNOSIS — E039 Hypothyroidism, unspecified: Secondary | ICD-10-CM

## 2014-09-02 DIAGNOSIS — Z8673 Personal history of transient ischemic attack (TIA), and cerebral infarction without residual deficits: Secondary | ICD-10-CM

## 2014-09-02 DIAGNOSIS — R6889 Other general symptoms and signs: Secondary | ICD-10-CM | POA: Diagnosis not present

## 2014-09-02 DIAGNOSIS — R55 Syncope and collapse: Secondary | ICD-10-CM

## 2014-09-02 DIAGNOSIS — M6289 Other specified disorders of muscle: Secondary | ICD-10-CM

## 2014-09-02 DIAGNOSIS — E1129 Type 2 diabetes mellitus with other diabetic kidney complication: Secondary | ICD-10-CM | POA: Diagnosis not present

## 2014-09-02 DIAGNOSIS — R748 Abnormal levels of other serum enzymes: Secondary | ICD-10-CM | POA: Diagnosis not present

## 2014-09-02 DIAGNOSIS — E1122 Type 2 diabetes mellitus with diabetic chronic kidney disease: Secondary | ICD-10-CM | POA: Diagnosis not present

## 2014-09-02 DIAGNOSIS — R7989 Other specified abnormal findings of blood chemistry: Secondary | ICD-10-CM

## 2014-09-02 DIAGNOSIS — N189 Chronic kidney disease, unspecified: Secondary | ICD-10-CM

## 2014-09-02 DIAGNOSIS — R2689 Other abnormalities of gait and mobility: Secondary | ICD-10-CM | POA: Diagnosis not present

## 2014-09-02 DIAGNOSIS — R531 Weakness: Secondary | ICD-10-CM

## 2014-09-02 DIAGNOSIS — M6281 Muscle weakness (generalized): Secondary | ICD-10-CM | POA: Diagnosis not present

## 2014-09-02 LAB — CBC WITH DIFFERENTIAL/PLATELET
BASOS PCT: 1 % (ref 0–1)
Basophils Absolute: 0.1 10*3/uL (ref 0.0–0.1)
Eosinophils Absolute: 0.2 10*3/uL (ref 0.0–0.7)
Eosinophils Relative: 4 % (ref 0–5)
HEMATOCRIT: 44.2 % (ref 39.0–52.0)
HEMOGLOBIN: 14.5 g/dL (ref 13.0–17.0)
Lymphocytes Relative: 31 % (ref 12–46)
Lymphs Abs: 1.8 10*3/uL (ref 0.7–4.0)
MCH: 29.9 pg (ref 26.0–34.0)
MCHC: 32.8 g/dL (ref 30.0–36.0)
MCV: 91.1 fL (ref 78.0–100.0)
MONO ABS: 0.6 10*3/uL (ref 0.1–1.0)
MONOS PCT: 10 % (ref 3–12)
MPV: 10.4 fL (ref 8.6–12.4)
Neutro Abs: 3.2 10*3/uL (ref 1.7–7.7)
Neutrophils Relative %: 54 % (ref 43–77)
Platelets: 248 10*3/uL (ref 150–400)
RBC: 4.85 MIL/uL (ref 4.22–5.81)
RDW: 13.3 % (ref 11.5–15.5)
WBC: 5.9 10*3/uL (ref 4.0–10.5)

## 2014-09-02 LAB — COMPREHENSIVE METABOLIC PANEL
ALBUMIN: 4.2 g/dL (ref 3.5–5.2)
ALT: 29 U/L (ref 0–53)
AST: 20 U/L (ref 0–37)
Alkaline Phosphatase: 70 U/L (ref 39–117)
BUN: 28 mg/dL — ABNORMAL HIGH (ref 6–23)
CO2: 26 mEq/L (ref 19–32)
Calcium: 9.6 mg/dL (ref 8.4–10.5)
Chloride: 96 mEq/L (ref 96–112)
Creat: 2.01 mg/dL — ABNORMAL HIGH (ref 0.50–1.35)
GLUCOSE: 329 mg/dL — AB (ref 70–99)
POTASSIUM: 4.9 meq/L (ref 3.5–5.3)
Sodium: 130 mEq/L — ABNORMAL LOW (ref 135–145)
TOTAL PROTEIN: 7.9 g/dL (ref 6.0–8.3)
Total Bilirubin: 0.5 mg/dL (ref 0.2–1.2)

## 2014-09-02 LAB — POCT GLYCOSYLATED HEMOGLOBIN (HGB A1C): Hemoglobin A1C: 8.8

## 2014-09-02 LAB — TSH: TSH: 2.046 u[IU]/mL (ref 0.350–4.500)

## 2014-09-02 NOTE — Progress Notes (Signed)
Subjective:    Patient ID: Andrew [name], male    DOB: [birth year], 59 y.o.   MRN: [id number]  HPI: Pt presents to clinic to discuss his diabetes. He reports he has been having CBG's at home in the 60's or lower for a couple of weeks. He stopped taking his glipizide about 2 weeks ago. He does report lightheadedness when he gets up, for around the same time period. He states it comes and goes. He denies room-spinning and states he feels like he is about to pass out; he has had CBG's in the normal ranges while he has these symptoms. He has not lost consciousness completely and has not had any falls. He denies episodes of facial droop, numbness or weakness, dysarthria, paresthesias, or seizure-like activity.  Pt notes he has lost weight 230 to 213, targeting 200, intentionally, over the past several weeks. He is happy with this weight loss.  Of note, pt does have a history of sarcoidosis (he believes he was diagnosed when he turned 48 and was last treated around 2007) and states he felt similar, then. He took inhaled steroids for a period of time but has not needed it in "a long time." He has had 2 strokes in 1994 and 2 in 2007.  Review of Systems: As above. He does endorse feelings of feeling "hot" at times, and occasionally has a "pimply rash" on his back and shoulders. He has no chest pain, difficulty breathing. He has had some intermittent low left side pain that improved with BMs, which have been hard at times.     Objective:   Physical Exam BP 131/78 mmHg  Pulse 59  Temp(Src) 98.1 F (36.7 C) (Oral)  Ht 6\' 1"  (1.854 m)  Wt 212 lb (96.163 kg)  BMI 27.98 kg/m2 Gen: well-appearing adult male in NAD HEENT: [place]/AT, EOMI, PERRLA, MMM Neck: supple, no masses Cardio: RRR, no murmur, distal pulses intact / symmetric Pulm: CTAB, no wheezes Abd: soft, nontender, BS+ Ext: warm, well-perfused, no LE edema Neuro: alert, oriented, strength 5/5 on the left throughout, sensation grossly  intact  Strength 4+/5 on the right, equivocally at baseline  CN II-XII grossly intact, rapid alternating movements, heel-shin testing, and finger-to-nose intact  No pronator drift  Pt able to stand with eyes open with feet together and arms outstretched with no imbalance  Pt loses balance when eyes are closed (falls backward) with feet together, arms outstretched     Assessment & Plan:  59yo male with DM type II, HTN, HLD, hypothyroidism, and hx of strokes now with hypoglycemia and unstable balance plus near-syncope - uncertain etiology of hypoglycemia, given A1c is 8.8; possible renal impairment concentrating effect of sulfonylurea - doubt frank hypotension but possible component of neurogenic orthostasis (?DM- or CVA-related autonomic dysfunction) - possible new stroke or progression of sequelae of old strokes  Plan: - continue to hold glipizide, for now - recheck labs (CBC, CMP, TSH) - ordered for outpt CT scan to evaluate for new (?missed) CVA - referred to vestibular rehab PT to see if this perhaps represents a new vertigo or similar condition amenable to balance training - instructed pt to continue to check CBG's at home and to start checking BP's at home if able, especially during episodes of lightheadedness - f/u in 3-4 weeks, or sooner depending on lab / imaging results - pt voiced understanding of reasons to present immediately to the ED (signs / symptoms of CVA or MI, etc)  Emmaline Kluver, MD PGY-3,  [place] 09/02/2014, 9:51 PM

## 2014-09-02 NOTE — Patient Instructions (Signed)
Thank you for coming in, today!  I'm not sure exactly what's causing your dizziness / lightedness. Your A1c is 8.8, so your blood sugars overall are not too low, but you do have episodes of low blood sugars in the 60's. Stop taking your glipizide for now. I will check several labs, today. Depending on the results, I will call your or send you a letter.  Your plan going forward: Get a blood pressure cuff and check it periodically at home. Check it during any episodes of the lightheadedness to see if your blood pressure is dropping. Keep checking your blood sugar daily. Keep taking all your other medications. Get your head CT and follow up with the vestibular rehab PT people (for balance). We will refer you to PT and schedule your CT scan. If you haven't heard from PT in about a week or so, call us to check on the referral.  Come back to see me in 3-4 weeks. Please feel free to call with any questions or concerns at any time, at [phone]. --Dr. Venetia Maxon

## 2014-09-03 ENCOUNTER — Telehealth: Payer: Self-pay | Admitting: Family Medicine

## 2014-09-03 MED ORDER — PIOGLITAZONE HCL 15 MG PO TABS: 15.0000 mg | ORAL_TABLET | Freq: Every day | ORAL | Status: DC

## 2014-09-03 NOTE — Telephone Encounter (Signed)
Called pt to discuss lab results. Generally normal / acceptable labs other than A1c of 8.8 (but having hypoglycemia with glipizide) and Cr still elevated ~2. Pt does not want to see a nephrologist, at this point. Advised him that control of BP and DM at this time is very important. Discussed a few options and will opt to start Actos 15 mg daily, then re-eval in about 2 months. Pt voiced understanding. New Rx sent in to [place]. Will f/u as needed, otherwise. Pt voiced understanding. --CMS

## 2014-09-09 ENCOUNTER — Ambulatory Visit
Admission: RE | Admit: 2014-09-09 | Discharge: 2014-09-09 | Disposition: A | Discharge status: Home / Self Care | Payer: Commercial Managed Care - HMO | Source: Ambulatory Visit | Attending: Family Medicine | Admitting: Family Medicine

## 2014-09-09 DIAGNOSIS — R6889 Other general symptoms and signs: Secondary | ICD-10-CM | POA: Diagnosis not present

## 2014-09-09 DIAGNOSIS — I639 Cerebral infarction, unspecified: Secondary | ICD-10-CM | POA: Diagnosis not present

## 2014-09-09 DIAGNOSIS — R2689 Other abnormalities of gait and mobility: Secondary | ICD-10-CM

## 2014-09-09 DIAGNOSIS — Z8673 Personal history of transient ischemic attack (TIA), and cerebral infarction without residual deficits: Secondary | ICD-10-CM

## 2014-09-09 DIAGNOSIS — M6289 Other specified disorders of muscle: Secondary | ICD-10-CM | POA: Diagnosis not present

## 2014-09-09 DIAGNOSIS — R531 Weakness: Secondary | ICD-10-CM

## 2014-09-10 ENCOUNTER — Telehealth: Payer: Self-pay | Admitting: Family Medicine

## 2014-09-10 NOTE — Telephone Encounter (Signed)
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  He understands  Laroy Apple, MD [place] Resident, PGY-3 09/10/2014, 12:24 PM

## 2014-09-14 DIAGNOSIS — R6889 Other general symptoms and signs: Secondary | ICD-10-CM | POA: Diagnosis not present

## 2014-10-06 ENCOUNTER — Ambulatory Visit: Payer: Commercial Managed Care - HMO | Admitting: Family Medicine

## 2014-10-07 ENCOUNTER — Ambulatory Visit (HOSPITAL_COMMUNITY)
Admission: RE | Admit: 2014-10-07 | Discharge: 2014-10-07 | Disposition: A | Discharge status: Home / Self Care | Payer: Commercial Managed Care - HMO | Source: Ambulatory Visit | Attending: Family Medicine | Admitting: Family Medicine

## 2014-10-07 ENCOUNTER — Ambulatory Visit (INDEPENDENT_AMBULATORY_CARE_PROVIDER_SITE_OTHER): Payer: Commercial Managed Care - HMO | Admitting: Family Medicine

## 2014-10-07 ENCOUNTER — Encounter: Payer: Self-pay | Admitting: Family Medicine

## 2014-10-07 VITALS — BP 114/70 | HR 68 | Temp 98.6°F | Ht 74.0 in | Wt 213.8 lb

## 2014-10-07 DIAGNOSIS — M25571 Pain in right ankle and joints of right foot: Secondary | ICD-10-CM | POA: Diagnosis not present

## 2014-10-07 DIAGNOSIS — M19071 Primary osteoarthritis, right ankle and foot: Secondary | ICD-10-CM | POA: Diagnosis not present

## 2014-10-07 MED ORDER — HYDROCODONE-ACETAMINOPHEN 5-325 MG PO TABS: 1.0000 | ORAL_TABLET | Freq: Three times a day (TID) | ORAL | Status: DC | PRN

## 2014-10-07 NOTE — Progress Notes (Signed)
   Subjective:    Patient ID: Andrew [name], male    DOB: Dec 16, 1955, 59 y.o.   MRN: [id number]  HPI: Pt presents to clinic for SDA, for right ankle pain that started about 4 days. He had no increased level of activity, injury, fall, or other inciting factors. The pain came up suddenly. The ankle did have some swelling and redness over the next couple of days. His usual tramadol for pain in his knees and back did not help, and he used some Vicodin that he had left from a previous prescription that helped, but he is now out. The pain was like a "very severe toothache," bad enough to keep him from walking. His pain today is not as bad, but he took his last Vicodin today. Overall, the redness and swelling is better. He has no known history of gout; he has never had ankle pain like this before. He did not have increased levels of blood sugar and reports compliance with all his regular medications.  Note pt had some R-sided weakness (see last office note by me) concerning for possible stroke, but CT didn't show any new obvious changes. Pt feels overall these symptoms have improved. Also note pt does have personal hx of sarcoidosis which was previously only manifested in his lungs, but he was told at one point that it may cause joint problems.  Review of Systems: As above. Denies fevers / chills, N/V, abdominal pain, cough.     Objective:   Physical Exam BP 114/70 mmHg  Pulse 68  Temp(Src) 98.6 F (37 C) (Oral)  Ht 6\' 2"  (1.88 m)  Wt 213 lb 12.8 oz (96.979 kg)  BMI 27.44 kg/m2 Gen: well-appearing adult male in NAD HEENT: /AT, EOMI, PERRLA, MMM Cardio: RRR, no murmur Pulm: CTAB, no wheezes Ext: warm, well-perfused, no LE edema MSK: right ankle normal to inspection without gross frank swelling  Minimal warmth over ankle / lower leg  No broken skin, ulceration, induration, or bleeding / drainage  Right ankle ROM is markedly reduced, passively and actively, secondary to pain  Negative  anterior drawer testing of right ankle, positive pain with talar tilt test Neurovascular: alert, oriented, distal pulses in feet 1+ but symmetric  Strength 4+/5 in all movements of right foot / ankle secondary to pain     Assessment & Plan:  59yo male with likely arthritic pain of right ankle, with uncertain etiology / trigger - doubt frank gout, though pt does have hx of renal insufficiency - defer labs or tap of ankle, today, but did obtain xray, which showed moderate arthritic changes and calcified arteries  Plan: - communicated xray result to pt - Rx for hydrocodone for short course (advised pt not to take this with tramadol) - advised rest, elevation, etc, in the meantime, as well as gentle stretching / ROM exercises - consider further work-up in the future if pain does not resolve or if it recurs - consider uric acid at next f/u once definitely out of any potential gouty flare - f/u in about 1 month for chronic illness f/u (DM, HTN, etc)  Emmaline Kluver, MD PGY-3, [facility] 10/07/2014, 2:53 PM

## 2014-10-07 NOTE — Patient Instructions (Signed)
Thank you for coming in, today!  I'm not sure what caused your pain. It could be sarcoid, or it could be something like gout. I want to get an xray first. After I see that, we can figure out what, if anything else needs to be done. For now, I'll refill your hydrocodone that you can use as needed. Don't take tramadol and hydrocodone at the same time.  Come back sometime next month to see me about your diabetes, blood pressure, etc. Depending on what the xray shows, I'll tell you if I want to see you back sooner.  Please feel free to call with any questions or concerns at any time, at [phone]. --Dr. Venetia Maxon

## 2014-10-15 ENCOUNTER — Other Ambulatory Visit: Payer: Self-pay | Admitting: Family Medicine

## 2014-10-15 NOTE — Telephone Encounter (Signed)
Refilled chlorthalidone, enalapril and actos. FYI to PCP, covering his box/orders today.   Laroy Apple, MD [place] Resident, PGY-3 10/15/2014, 10:34 AM

## 2014-10-21 DIAGNOSIS — R6889 Other general symptoms and signs: Secondary | ICD-10-CM | POA: Diagnosis not present

## 2014-10-26 ENCOUNTER — Ambulatory Visit (INDEPENDENT_AMBULATORY_CARE_PROVIDER_SITE_OTHER): Payer: Commercial Managed Care - HMO | Admitting: Family Medicine

## 2014-10-26 ENCOUNTER — Encounter: Payer: Self-pay | Admitting: Family Medicine

## 2014-10-26 VITALS — BP 113/71 | HR 58 | Temp 97.4°F | Ht 74.0 in | Wt 210.0 lb

## 2014-10-26 DIAGNOSIS — M2550 Pain in unspecified joint: Secondary | ICD-10-CM | POA: Diagnosis not present

## 2014-10-26 DIAGNOSIS — E1122 Type 2 diabetes mellitus with diabetic chronic kidney disease: Secondary | ICD-10-CM

## 2014-10-26 DIAGNOSIS — N189 Chronic kidney disease, unspecified: Secondary | ICD-10-CM

## 2014-10-26 DIAGNOSIS — E039 Hypothyroidism, unspecified: Secondary | ICD-10-CM | POA: Diagnosis not present

## 2014-10-26 DIAGNOSIS — E1169 Type 2 diabetes mellitus with other specified complication: Secondary | ICD-10-CM

## 2014-10-26 DIAGNOSIS — M25561 Pain in right knee: Secondary | ICD-10-CM

## 2014-10-26 DIAGNOSIS — I10 Essential (primary) hypertension: Secondary | ICD-10-CM | POA: Diagnosis not present

## 2014-10-26 DIAGNOSIS — M79671 Pain in right foot: Secondary | ICD-10-CM | POA: Insufficient documentation

## 2014-10-26 DIAGNOSIS — E785 Hyperlipidemia, unspecified: Secondary | ICD-10-CM | POA: Diagnosis not present

## 2014-10-26 DIAGNOSIS — M79672 Pain in left foot: Secondary | ICD-10-CM

## 2014-10-26 DIAGNOSIS — M25562 Pain in left knee: Secondary | ICD-10-CM

## 2014-10-26 LAB — COMPREHENSIVE METABOLIC PANEL
ALT: 15 U/L (ref 0–53)
AST: 16 U/L (ref 0–37)
Albumin: 3.8 g/dL (ref 3.5–5.2)
Alkaline Phosphatase: 54 U/L (ref 39–117)
BUN: 22 mg/dL (ref 6–23)
CALCIUM: 8.7 mg/dL (ref 8.4–10.5)
CO2: 23 mEq/L (ref 19–32)
CREATININE: 1.62 mg/dL — AB (ref 0.50–1.35)
Chloride: 101 mEq/L (ref 96–112)
Glucose, Bld: 221 mg/dL — ABNORMAL HIGH (ref 70–99)
Potassium: 4.4 mEq/L (ref 3.5–5.3)
Sodium: 134 mEq/L — ABNORMAL LOW (ref 135–145)
Total Bilirubin: 0.3 mg/dL (ref 0.2–1.2)
Total Protein: 7.3 g/dL (ref 6.0–8.3)

## 2014-10-26 LAB — LIPID PANEL
Cholesterol: 102 mg/dL (ref 0–200)
HDL: 41 mg/dL (ref >=40)
LDL CALC: 41 mg/dL (ref 0–99)
TRIGLYCERIDES: 101 mg/dL (ref <150)
Total CHOL/HDL Ratio: 2.5 Ratio
VLDL: 20 mg/dL (ref 0–40)

## 2014-10-26 LAB — URIC ACID: Uric Acid, Serum: 8 mg/dL — ABNORMAL HIGH (ref 4.0–7.8)

## 2014-10-26 LAB — TSH: TSH: 0.559 u[IU]/mL (ref 0.350–4.500)

## 2014-10-26 LAB — POCT GLYCOSYLATED HEMOGLOBIN (HGB A1C): Hemoglobin A1C: 10.4

## 2014-10-26 MED ORDER — TRAMADOL HCL 50 MG PO TABS: 50.0000 mg | ORAL_TABLET | Freq: Three times a day (TID) | ORAL | Status: DC | PRN

## 2014-10-26 MED ORDER — LOVASTATIN 40 MG PO TABS: ORAL_TABLET | ORAL | Status: DC

## 2014-10-26 MED ORDER — PIOGLITAZONE HCL 15 MG PO TABS: 15.0000 mg | ORAL_TABLET | Freq: Every day | ORAL | Status: DC

## 2014-10-26 MED ORDER — LEVOTHYROXINE SODIUM 75 MCG PO TABS
75.0000 ug | ORAL_TABLET | Freq: Every day | ORAL | Status: DC
Start: 2014-10-26 — End: 2015-05-10

## 2014-10-26 MED ORDER — SPIRONOLACTONE 25 MG PO TABS: 25.0000 mg | ORAL_TABLET | Freq: Every day | ORAL | Status: DC

## 2014-10-26 MED ORDER — ENALAPRIL MALEATE 20 MG PO TABS: 40.0000 mg | ORAL_TABLET | Freq: Every day | ORAL | Status: DC

## 2014-10-26 MED ORDER — AMLODIPINE BESYLATE 10 MG PO TABS: ORAL_TABLET | ORAL | Status: DC

## 2014-10-26 MED ORDER — CHLORTHALIDONE 25 MG PO TABS: 25.0000 mg | ORAL_TABLET | Freq: Every day | ORAL | Status: DC

## 2014-10-26 NOTE — Progress Notes (Signed)
   Subjective:    Patient ID: Andrew [name], male    DOB: Oct 05, 1955, 59 y.o.   MRN: [id number]  HPI: Pt presents to clinic for f/u of DM, HTN, HLD. Also reports some left foot pain similar to recent right foot / ankle pain  DM - remains off metformin due to kidney function; changed from glipizide to Actos recently - overall feels like Actos is working well - checks CBG's at home, rarely higher than 150; very rarely up to 250 or 300 - denies hypoglycemic episodes  HTN - reports compliance with Norvasc, enalapril, chlorthalidone, spironolactone - specifically denies chest pain, headache, change in vision  - reports significant increase in fish in his diet, as well as vegetables - no longer eating red meat, but does eat pork and chicken and has cut back starchy foods  HLD - reports compliance with lovastatin. Denies new body/muscle aches.  Hypothyroidism - reports compliance with Synthroid  - continues to deny weight loss, rashes, skin / hair changes, change in bowel / bladder habits   Chronic knee pain - managed well with tramadol; sometimes is worse with rare alcohol use - uses tramadol once every 2-3 weeks, requests refill  Bilateral feet pain - seen recently for right ankle pain, which has been better - now having some left foot pain for about a month - pain is on the inside of his left foot, tender to palpation or with walking - no new injury or change in activity  Pt is a never smoker.  In addition to the above documentation, pt's PMH, surgical history, FH, and SH all reviewed and updated where appropriate in the EMR.  I have also reviewed and updated the pt's allergies and current medications as appropriate.  Review of Systems: As above. Otherwise, full 12-system ROS was reviewed and all negative.      Objective:   Physical Exam BP 113/71 mmHg  Pulse 58  Temp(Src) 97.4 F (36.3 C) (Oral)  Ht 6\' 2"  (1.88 m)  Wt 210 lb (95.255 kg)  BMI 26.95 kg/m2 Gen:  well-appearing adult male in NAD HEENT: /AT, sclerae/conjunctivae clear, no lid lag, EOMI, PERRLA   MMM, posterior oropharynx clear, no cervical lymphadenopathy   neck supple with full ROM, no masses appreciated; thyroid not enlarged  Cardio: RRR, no murmur appreciated; distal pulses intact/symmetric  Pulm: CTAB, no wheezes, normal WOB  Abd: soft, nondistended, BS+, no HSM  Ext: warm/well-perfused, no cyanosis/clubbing/edema  MSK: strength 5/5 in all four extremities, no frank joint deformity/effusion   Normal ROM to all four extremities   Diffuse, mild soft tissue and bony tenderness throughout lumbar spine and bony prominences of posterior pelvis   Right foot / ankle without frank tenderness, unusual appearance, or decrease in ROM  Left foot grossly normal in appearance, with point tenderness over navicular / first cuneiform bones  Bilateral pes planus noted on neutral stance Neuro/Psych: alert/oriented, sensation grossly intact  Mood euthymic with congruent affect  Skin: warm, dry, intact skin without rash     Assessment & Plan:  See problem list notes.

## 2014-10-26 NOTE — Assessment & Plan Note (Signed)
Compliant with Synthroid 75 mcg daily, with no frank hypothyroid-type symptoms. Recheck TSH, today. New refill for Synthroid sent in, today. F/u as needed, next TSH in 6-12 months or sooner if needed.

## 2014-10-26 NOTE — Assessment & Plan Note (Signed)
Continued good control with enalapril 40 mg daily, chlorthalidone 25 mg daily, Aldactone 25 mg daily, and Norvasc 10 mg daily. Continue current meds / doses. Rechecking CMP, today, for kidney function; new refills sent in, today. F/u BP and re-evaluate medications routinely at next f/u.

## 2014-10-26 NOTE — Patient Instructions (Signed)
Thank you for coming in, today!  Everything looks okay, today. I will refer you to the sports medicine doctor to help with your foot pain. I think custom-molded shoe inserts might help with that and your knee pain.  Keep taking your same medicines without any changes, for now. I'll check some blood work today and let you know if anything needs to change. I'll send in refills for your medicines, today.  Come back to see [place] in 2-3 months. My last day is June 30th -- after that you'll have a new doctor here. You can come see me one more time or see them sometime in June. Please feel free to call with any questions or concerns at any time, at [phone]. --Dr. Venetia Maxon

## 2014-10-26 NOTE — Assessment & Plan Note (Signed)
Good compliance with Mevacor, without frank side effects. New refill sent in today. Checking CMP and lipid panel. F/u in 3-6 months.

## 2014-10-26 NOTE — Assessment & Plan Note (Signed)
Multiple joint pains, primarily bilateral knees, but also in the past in the left hip, right foot, and left foot, at different times. No known history of gout and no tophi on exam, but pt does have chronic renal impairment. Will check uric acid today and consider urate-lowering therapy if elevated >6-7. Continue Ultram, for now; new Rx faxed to [facility]. F/u as needed.

## 2014-10-26 NOTE — Assessment & Plan Note (Signed)
A: Currently left > right pain, though with intermittent bilateral pain. Bilateral pes planus noted; per chart review, some question of possible leg-length discrepancy and previous referral to sports medicine for discussion of shoe inserts, but pt apparently did not f/u with them.   P: Continue Ultram and checking uric acid per other problem list notes. New referral placed, today, for consideration of custom orthotics. F/u as needed, otherwise.

## 2014-10-26 NOTE — Assessment & Plan Note (Signed)
Long-standing, generally well-controlled with Ultram. Bilateral pes planus noted; per chart review, some question of possible leg-length discrepancy and previous referral to sports medicine for discussion of shoe inserts, but pt apparently did not f/u with them. New referral placed, today, for consideration of custom orthotics. F/u as needed, otherwise.

## 2014-10-26 NOTE — Assessment & Plan Note (Signed)
A: A1c continues to trend up (10.4 today), despite reported improvements in diet / exercise, and compliance with Actos (stopped metformin due to renal function, and stopped glipizide due to concern for hypoglycemia).   P: Uncertain if W5Y recheck is accurate as reported CBG's at home are within a good range. Also checking CMP today, for renal function. Continue Actos, ACE, ASA, statin; based on further lab results, may consider restarting low-dose metformin or addition of another PO medication -- will discuss with Dr. Valentina Lucks and / or consider pharmacy clinic referral. F/u in 2-3 months, otherwise.

## 2014-10-28 ENCOUNTER — Telehealth: Payer: Self-pay | Admitting: Family Medicine

## 2014-10-28 NOTE — Telephone Encounter (Addendum)
Lab results reviewed. U8H >72 (uncertain if this is accurate; out of keeping with recent A1c's and pt's reported CBG's at home are in a good range), and uric acid 8, but otherwise labs unremarkable - Cr slightly improved, TSH in normal range, lipid numbers good on statin.  Red Team: If pt calls back, please relay the following: Overall pt's labs look fine. His A1c is higher than last time, but I don't want to change any medicines for diabetes, yet. We will recheck this in a couple of months and adjust his medications then, if needed. His uric acid (a chemical that causes gout, that can build up especially in people with diminished kidney function) is slightly high, and we can start a medicine to lower this number and reduce his chance of developing gout in the future. Otherwise he should continue his regular medications without any changes, and plan to come back to see me sometime in late June.  Call 1 (9 AM): Left message instructing pt to call back to get lab results. Will attempt to call pt, myself, later this morning.  Call 2 (11:30 AM): Left message instructing pt to call back for the above message. Routed FYI to [place], MD PGY-3, [facility] 10/28/2014, 11:36 AM

## 2014-10-29 MED ORDER — ALLOPURINOL 100 MG PO TABS: 100.0000 mg | ORAL_TABLET | Freq: Every day | ORAL | Status: DC

## 2014-10-29 NOTE — Telephone Encounter (Signed)
Spoke with patient and relayed message from MD. Patient expressed understanding and would like MD to go ahead and send in medication to reduce uric acid number to his [place]. Will forward to PCP.

## 2014-10-29 NOTE — Telephone Encounter (Signed)
Allopurinol sent electronically. Will likely recheck uric acid at next f/u. Thanks! --CMS

## 2014-10-29 NOTE — Addendum Note (Signed)
Addended by: Emmaline Kluver on: 10/29/2014 09:47 AM   Modules accepted: Orders

## 2014-11-01 ENCOUNTER — Telehealth: Payer: Self-pay | Admitting: Family Medicine

## 2014-11-01 DIAGNOSIS — E1122 Type 2 diabetes mellitus with diabetic chronic kidney disease: Secondary | ICD-10-CM

## 2014-11-01 NOTE — Telephone Encounter (Signed)
Needs to go see eye dr Needs referral. Needs to be one that accepts [facility] Needs to [facility] or [employer]

## 2014-11-01 NOTE — Telephone Encounter (Signed)
Will place new referral, today. Thanks! --CMS

## 2014-11-01 NOTE — Telephone Encounter (Signed)
Needs new optho referral for insurance purposes, sees Dr. Katy Fitch. Will forward to MD to place referral.

## 2014-11-03 ENCOUNTER — Telehealth: Payer: Self-pay | Admitting: Family Medicine

## 2014-11-03 NOTE — Telephone Encounter (Signed)
LVM for patient to call back. Please advise him that his appt at [facility] care has been scheduled for Wed. Nov 24, 2014 at 10:30am with Dr. Midge Aver (can no longer see Dr. Eppie Gibson due to [facility]).  Address: [address], [place], [place] [address] [phone]

## 2014-11-04 NOTE — Telephone Encounter (Signed)
Andrew [name] called and I informed him of the message below. He agreed to this appointment. Andrew [name], ASA

## 2014-11-15 ENCOUNTER — Ambulatory Visit (INDEPENDENT_AMBULATORY_CARE_PROVIDER_SITE_OTHER): Payer: Commercial Managed Care - HMO | Admitting: Sports Medicine

## 2014-11-15 ENCOUNTER — Encounter: Payer: Self-pay | Admitting: Sports Medicine

## 2014-11-15 VITALS — BP 108/63 | Ht 73.0 in | Wt 210.0 lb

## 2014-11-15 DIAGNOSIS — M25562 Pain in left knee: Secondary | ICD-10-CM

## 2014-11-15 DIAGNOSIS — M2141 Flat foot [pes planus] (acquired), right foot: Secondary | ICD-10-CM | POA: Diagnosis not present

## 2014-11-15 DIAGNOSIS — M2142 Flat foot [pes planus] (acquired), left foot: Secondary | ICD-10-CM | POA: Diagnosis not present

## 2014-11-15 DIAGNOSIS — M25561 Pain in right knee: Secondary | ICD-10-CM | POA: Diagnosis not present

## 2014-11-15 NOTE — Progress Notes (Signed)
   Subjective:    Patient ID: Andrew [name], male    DOB: 12/08/1955, 59 y.o.   MRN: [id number]  HPI chief complaint: Bilateral foot and bilateral knee pain  Very pleasant 59 year old male comes in today complaining of bilateral knee and bilateral foot pain. Knee pain has been present for several years. His pain is minimal but his main complaint is stiffness that is present with getting up from a seated position, sitting down, or going up and down stairs. No mechanical symptoms. He's not noticed any swelling. He is most interested in trying some sort of brace for his knees.  He is also complaining of bilateral arch pain. Left foot is worse than the right. Symptoms have been present now for about 2-3 weeks. No trauma. No swelling. No numbness or tingling. No problems with his feet in the past. No heel pain. Symptoms improve at rest.  Past medical history reviewed Medications reviewed Allergies reviewed    Review of Systems    as above Objective:   Physical Exam Well-developed, well-nourished. No acute distress. Awake alert and oriented 3. Vital signs reviewed  Examination of each knee shows full range of motion. No effusion. 2+ patellofemoral crepitus on the left, 1+ on the right. No joint line tenderness. Negative McMurray's. Good joint stability. No Baker's cyst.  Examination of each of his feet in the standing position shows moderate pes planus. He is tender to palpation at the navicular on the left as well as the distal posterior tibialis tendon. No soft tissue swelling. No ankle effusion. Negative Tinel's at the tarsal tunnel. Walking without a significant limp.       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 I will get some x-rays of his knees as well. For his feet I have given him bilateral arch straps and we will  fit his shoes with green sports insoles and scaphoid pads. I did give him permission to remove the scaphoid pad on the left if he finds it to be too uncomfortable as it may place quite a bit of pressure on his navicular which is already sore. Patient will return to the office in 4 weeks. If he is doing well with his temporary inserts we will plan on making him custom orthotics at that time. We will also review his knee x-rays at that visit.

## 2014-11-19 ENCOUNTER — Other Ambulatory Visit: Payer: Self-pay | Admitting: Sports Medicine

## 2014-11-19 ENCOUNTER — Ambulatory Visit
Admission: RE | Admit: 2014-11-19 | Discharge: 2014-11-19 | Disposition: A | Discharge status: Home / Self Care | Payer: Commercial Managed Care - HMO | Source: Ambulatory Visit | Attending: Sports Medicine | Admitting: Sports Medicine

## 2014-11-19 DIAGNOSIS — M1711 Unilateral primary osteoarthritis, right knee: Secondary | ICD-10-CM | POA: Diagnosis not present

## 2014-11-19 DIAGNOSIS — M25561 Pain in right knee: Secondary | ICD-10-CM

## 2014-11-19 DIAGNOSIS — M1712 Unilateral primary osteoarthritis, left knee: Secondary | ICD-10-CM | POA: Diagnosis not present

## 2014-11-19 DIAGNOSIS — M25562 Pain in left knee: Principal | ICD-10-CM

## 2014-11-23 DIAGNOSIS — R6889 Other general symptoms and signs: Secondary | ICD-10-CM | POA: Diagnosis not present

## 2014-12-13 ENCOUNTER — Telehealth: Payer: Self-pay | Admitting: Family Medicine

## 2014-12-13 NOTE — Telephone Encounter (Signed)
Hinton Dyer from [facility] calling on behalf of Dr. Rutherford Guys needs a [employer] referral put in for this patient to be seen on June 27th. The address is at [address] [place] [address], Phone: [phone], Dana's direct # is [phone] ; The Diagnosis Codes are E11.9,   H25.13,  And  H25.013. Thank you, Fonda Kinder, ASA

## 2014-12-13 NOTE — Telephone Encounter (Signed)
[employer] referral placed. Josem Kaufmann #[id number]

## 2014-12-14 DIAGNOSIS — R6889 Other general symptoms and signs: Secondary | ICD-10-CM | POA: Diagnosis not present

## 2014-12-20 ENCOUNTER — Encounter: Payer: Commercial Managed Care - HMO | Admitting: Sports Medicine

## 2015-01-03 DIAGNOSIS — H5210 Myopia, unspecified eye: Secondary | ICD-10-CM | POA: Diagnosis not present

## 2015-01-03 DIAGNOSIS — H5213 Myopia, bilateral: Secondary | ICD-10-CM | POA: Diagnosis not present

## 2015-01-03 LAB — HM DIABETES EYE EXAM

## 2015-01-05 ENCOUNTER — Ambulatory Visit (INDEPENDENT_AMBULATORY_CARE_PROVIDER_SITE_OTHER): Payer: Commercial Managed Care - HMO | Admitting: Sports Medicine

## 2015-01-05 ENCOUNTER — Encounter: Payer: Self-pay | Admitting: Sports Medicine

## 2015-01-05 VITALS — BP 112/66 | Ht 73.0 in | Wt 210.0 lb

## 2015-01-05 DIAGNOSIS — M25562 Pain in left knee: Secondary | ICD-10-CM | POA: Diagnosis not present

## 2015-01-05 DIAGNOSIS — M25561 Pain in right knee: Secondary | ICD-10-CM | POA: Diagnosis not present

## 2015-01-06 NOTE — Progress Notes (Signed)
Patient ID: Andrew [name], male   DOB: 02-19-1956, 59 y.o.   MRN: [id number]   Patient comes in today for custom orthotics. 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 X-rays of his knees done on May 13 are reviewed. 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 His left knee x-rays show a chronic depressed lateral tibial plateau fracture with mild to moderate arthritis. He has done well with his temporary inserts and is now ready to proceed with custom orthotics.  Custom orthotics were constructed for the patient today. He found them to be comfortable prior to leaving the office. A total of 30 minutes was spent with the patient with greater than 50% of the time spent in face-to-face consultation discussing orthotic construction, instruction, and fitting. Patient will follow-up when necessary.  Patient was fitted for a : standard, cushioned, semi-rigid orthotic. The orthotic was heated and afterward the patient stood on the orthotic blank positioned on the orthotic stand. The patient was positioned in subtalar neutral position and 10 degrees of ankle dorsiflexion in a weight bearing stance. After completion of molding, a stable base was applied to the orthotic blank. The blank was ground to a stable position for weight bearing. Size: 10 Base: blue EVA Posting: none Additional orthotic padding: none

## 2015-01-20 ENCOUNTER — Ambulatory Visit (INDEPENDENT_AMBULATORY_CARE_PROVIDER_SITE_OTHER): Payer: Commercial Managed Care - HMO | Admitting: Family Medicine

## 2015-01-20 DIAGNOSIS — R799 Abnormal finding of blood chemistry, unspecified: Secondary | ICD-10-CM | POA: Diagnosis not present

## 2015-01-20 DIAGNOSIS — R7989 Other specified abnormal findings of blood chemistry: Secondary | ICD-10-CM

## 2015-01-20 DIAGNOSIS — E790 Hyperuricemia without signs of inflammatory arthritis and tophaceous disease: Secondary | ICD-10-CM

## 2015-01-20 DIAGNOSIS — R748 Abnormal levels of other serum enzymes: Secondary | ICD-10-CM | POA: Diagnosis not present

## 2015-01-20 DIAGNOSIS — E119 Type 2 diabetes mellitus without complications: Secondary | ICD-10-CM | POA: Diagnosis not present

## 2015-01-20 LAB — POCT GLYCOSYLATED HEMOGLOBIN (HGB A1C): Hemoglobin A1C: 9.1

## 2015-01-20 NOTE — Progress Notes (Signed)
Entered in error

## 2015-01-21 LAB — BASIC METABOLIC PANEL
BUN: 22 mg/dL (ref 6–23)
CO2: 23 mEq/L (ref 19–32)
Calcium: 8.6 mg/dL (ref 8.4–10.5)
Chloride: 95 mEq/L — ABNORMAL LOW (ref 96–112)
Creat: 1.54 mg/dL — ABNORMAL HIGH (ref 0.50–1.35)
Glucose, Bld: 189 mg/dL — ABNORMAL HIGH (ref 70–99)
Potassium: 4.3 mEq/L (ref 3.5–5.3)
Sodium: 125 mEq/L — ABNORMAL LOW (ref 135–145)

## 2015-01-21 LAB — URIC ACID: Uric Acid, Serum: 7 mg/dL (ref 4.0–7.8)

## 2015-01-31 ENCOUNTER — Ambulatory Visit (INDEPENDENT_AMBULATORY_CARE_PROVIDER_SITE_OTHER): Payer: Commercial Managed Care - HMO | Admitting: Family Medicine

## 2015-01-31 ENCOUNTER — Encounter: Payer: Self-pay | Admitting: Family Medicine

## 2015-01-31 VITALS — BP 116/64 | HR 60 | Temp 98.0°F | Ht 73.0 in | Wt 211.4 lb

## 2015-01-31 DIAGNOSIS — E1122 Type 2 diabetes mellitus with diabetic chronic kidney disease: Secondary | ICD-10-CM

## 2015-01-31 DIAGNOSIS — N189 Chronic kidney disease, unspecified: Secondary | ICD-10-CM

## 2015-01-31 NOTE — Assessment & Plan Note (Addendum)
Alc slightly improved today from 10.4 to 9.1. Pt reports improvement in diet, exercise, and home CBGs. Still off metformin due to renal function. Was on glipizide in the past however d/c duet to concerns for hypoglycemia. - Continue Actos, ASA, ace-inhibitor, and statin - If CBGs remain elevated consider addition of metformin if SCr improving. Could consider discussing/referral to Dr. Valentina Lucks as well. - Pt reluctant to change anything right now, would like to attempt lifestyle modifications since A1c is starting to improve -F/u in 2-3 months, if A1c continues to be elevated dicussed medication changes with the pt.

## 2015-01-31 NOTE — Progress Notes (Signed)
Patient ID: Andrew [name], male   DOB: [birth year], 59 y.o.   MRN: [id number]    Subjective: CC:f/u DM and kidneys  HPI: Patient is a 59 y.o. male with a past medical history of DM, HTN, elevated SCr presenting to clinic today for f/u DM. Of note, pt was scheduled last week, however unfortunately came and got labs then left prior to being seen.   Diabetes:  CBGs: 100-200s Taking medications: compliant with Actos. Stopped metformin due to elevated SCr Side effects: none  ROS: denies fever, chills, dizziness, LOC, polyuria, polydipsia, numbness or tingling in extremities or chest pain. Last eye exam: 01/03/15 Last foot exam: 12/16/13 Last A1c:  Last A1c 10.4 on 4/016 improved to 9.1 on 7/14 Last pneumovax: 04/14/14    Social History: never smoker   ROS: All other systems reviewed and are negative.  Past Medical History Patient Active Problem List   Diagnosis Date Noted  . Bilateral foot pain 10/26/2014  . Impairment of balance 09/02/2014  . History of stroke 09/02/2014  . Tooth pain 01/28/2014  . De Quervain's tenosynovitis, right 11/05/2013  . Pinguecula of both eyes 09/23/2013  . Chronic hyponatremia 09/23/2013  . Elevated serum creatinine 05/25/2013  . Hypothyroidism 05/25/2013  . Memory loss 05/21/2013  . Rash and nonspecific skin eruption 05/21/2013  . Personal history of colonic adenomas 04/01/2013  . Unspecified constipation 01/13/2013  . Joint pain 05/06/2011  . OBESITY, UNSPECIFIED 05/20/2009  . Knee pain, bilateral 04/28/2009  . LUMBAGO 03/25/2009  . PSEUDOFOLLICULITIS BARBAE 01/60/1093  . SARCOIDOSIS 02/20/2007  . Hyperlipidemia associated with type 2 diabetes mellitus 02/20/2007  . DM (diabetes mellitus), type 2 with renal complications 23/55/7322  . ERECTILE DYSFUNCTION 10/18/2006  . 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 Essential hypertension 10/18/2006  . CKD (chronic kidney disease) stage 3, GFR 30-59 ml/min 09/05/2006     Medications- reviewed and updated Current Outpatient Prescriptions  Medication Sig Dispense Refill  . allopurinol (ZYLOPRIM) 100 MG tablet Take 1 tablet (100 mg total) by mouth daily. 30 tablet 3  . amLODipine (NORVASC) 10 MG tablet TAKE 1 TABLET ONE TIME DAILY 90 tablet 1  . aspirin 81 MG chewable tablet Chew 81 mg by mouth daily.    . Blood Glucose Monitoring Suppl (BLOOD GLUCOSE METER) kit Use to test fasting glucose daily.  Diagnosis Type II diabetes 250.0 1 each 0  . chlorhexidine (PERIDEX) 0.12 % solution Use as directed 5 mLs in the mouth or throat 2 (two) times daily. Swish and spit after 30 seconds. 1892 mL 6  . chlorthalidone (HYGROTON) 25 MG tablet Take 1 tablet (25 mg total) by mouth daily. 90 tablet 1  . clindamycin (CLEOCIN) 150 MG capsule     . enalapril (VASOTEC) 20 MG tablet Take 2 tablets (40 mg total) by mouth daily. 180 tablet 1  . glucose blood (TRUETEST TEST) test strip Use to test blood sugar twice daily for type II diabetes (E11.29) 200 each 3  . HYDROcodone-acetaminophen (NORCO) 7.5-325 MG per tablet     . HYDROcodone-acetaminophen (NORCO/VICODIN) 5-325 MG per tablet     . levothyroxine (SYNTHROID, LEVOTHROID) 75 MCG tablet Take 1 tablet (75 mcg total) by mouth daily. 90 tablet 1  . lovastatin (MEVACOR) 40 MG tablet TAKE 1 TABLET AT BEDTIME 90 tablet 1  . pioglitazone (ACTOS) 15 MG tablet Take 1 tablet (15 mg total) by mouth daily. 90 tablet 1  . PRODIGY LANCETS 26G MISC Use to check fasting blood glucose each morning.  Diagnosis: Diabetes Mellitus Type II 250.00 100 each 12  . spironolactone (ALDACTONE) 25 MG tablet Take 1 tablet (25 mg total) by mouth daily. 90 tablet 1  . tadalafil (CIALIS) 10 MG tablet Take 1 tablet (10 mg total) by mouth daily as needed for erectile dysfunction. 5 tablet 3  . Tdap (BOOSTRIX) 5-2.5-18.5 LF-MCG/0.5 injection Inject 0.5 mLs into the muscle once. 0.5 mL 0  . traMADol (ULTRAM) 50 MG tablet Take 1-3 tablets (50-150 mg total) by  mouth every 8 (eight) hours as needed. 240 tablet 1   No current facility-administered medications for this visit.    Objective: Office vital signs reviewed. BP 116/64 mmHg  Pulse 60  Temp(Src) 98 F (36.7 C) (Oral)  Ht '6\' 1"'  (1.854 m)  Wt 211 lb 6.4 oz (95.89 kg)  BMI 27.90 kg/m2   Physical Examination:  General: Awake, alert, well nourished, NAD Cardio: RRR, no m/r/g noted. No pitting edema noted.  Pulm: No increased WOB.  CTAB, without wheezes, rhonchi or crackles noted.  GI: soft, NT/ND,+BS x4, no hepatomegaly, no splenomegaly MSK: Normal gait and station   A1c 10.4 on 4/016 improved to 9.1 on 7/14 SCr 1.62 improved to 1.54 on 7/14 Uric acid 8 (started allopurinol and was down to 7.0)  Assessment/Plan: DM (diabetes mellitus), type 2 with renal complications Alc slightly improved today from 10.4 to 9.1. Pt reports improvement in diet, exercise, and home CBGs. Still off metformin due to renal function. Was on glipizide in the past however d/c duet to concerns for hypoglycemia. - Continue Actos, ASA, ace-inhibitor, and statin - If CBGs remain elevated consider addition of metformin if SCr improving. Could consider discussing/referral to Dr. Valentina Lucks as well. - Pt reluctant to change anything right now, would like to attempt lifestyle modifications since A1c is starting to improve -F/u in 2-3 months, if A1c continues to be elevated dicussed medication changes with the pt.     No orders of the defined types were placed in this encounter.    No orders of the defined types were placed in this encounter.    Archie Patten PGY-2, [place]

## 2015-01-31 NOTE — Patient Instructions (Signed)
I am glad to see your kidneys and uric acid are improving. Please continue to eat healthy and stay hydrated. We'll continue the current blood pressure and diabetes regimen for now, if your A1c does not improve we'll need to add another medication. Please follow up with me in 2 months.

## 2015-02-03 ENCOUNTER — Other Ambulatory Visit: Payer: Self-pay | Admitting: Family Medicine

## 2015-02-08 NOTE — Telephone Encounter (Signed)
Pt called and wanted the doctor to know that he is down to his 4 last pills of his Allopurinol. jw

## 2015-02-09 NOTE — Telephone Encounter (Signed)
Rx filled.  Thanks, Archie Patten, MD [facility] Resident  02/09/2015, 1:33 PM

## 2015-02-14 ENCOUNTER — Other Ambulatory Visit: Payer: Self-pay | Admitting: Family Medicine

## 2015-02-14 ENCOUNTER — Telehealth: Payer: Self-pay | Admitting: Family Medicine

## 2015-02-14 NOTE — Telephone Encounter (Signed)
Left message on voicemail.

## 2015-02-14 NOTE — Telephone Encounter (Signed)
Received a request to write a Rx for a back brace. I do not see this discussed with his previous PCP, Dr. Venetia Maxon, or at sports medicine. Please ask the patient to make an appointment so this can be evaluated.  Thanks, Archie Patten, MD [facility] Resident  02/14/2015, 8:45 AM

## 2015-02-24 NOTE — Telephone Encounter (Signed)
Pt called and needs a refill on his Trazodone called in. He has been without this for a week and no response from out office. Can we call this is today since he is out and not sleeping. jw

## 2015-03-24 ENCOUNTER — Ambulatory Visit (INDEPENDENT_AMBULATORY_CARE_PROVIDER_SITE_OTHER): Payer: Commercial Managed Care - HMO | Admitting: Internal Medicine

## 2015-03-24 ENCOUNTER — Encounter: Payer: Self-pay | Admitting: Internal Medicine

## 2015-03-24 VITALS — BP 138/86 | HR 73 | Temp 98.2°F | Ht 73.0 in | Wt 218.0 lb

## 2015-03-24 DIAGNOSIS — Z23 Encounter for immunization: Secondary | ICD-10-CM

## 2015-03-24 DIAGNOSIS — L989 Disorder of the skin and subcutaneous tissue, unspecified: Secondary | ICD-10-CM

## 2015-03-24 DIAGNOSIS — N189 Chronic kidney disease, unspecified: Secondary | ICD-10-CM

## 2015-03-24 DIAGNOSIS — E1122 Type 2 diabetes mellitus with diabetic chronic kidney disease: Secondary | ICD-10-CM

## 2015-03-24 LAB — POCT GLYCOSYLATED HEMOGLOBIN (HGB A1C): Hemoglobin A1C: 7.7

## 2015-03-24 NOTE — Progress Notes (Signed)
Patient ID: Andrew [name], male   DOB: 27-Feb-1956, 59 y.o.   MRN: [id number]   [facility] Kerrin Mo, MD Phone: [phone]  Subjective:   # Diabetes: 80- 160 (max) Blood sugars. Morning blood sugars (160, 120, 130). Eating habits, not eating starch, now eating only a meat and vegetable. Exercise - walking the dog daily, and weight-lifting in the morning. Taking glimepiride consistently. No blood sugar lows. Last month blood sugar dropped 50, patient took some orange juice and improved. No issues with blood sugars   # Back Brace: Patient states that he did not call. According to patient, someone was calling the clinic to try to order him a back brace. He thinks that it is some type of scam.   All relevant systems were reviewed and were negative unless otherwise noted in the HPI  Past Medical History Reviewed problem list.  Medications- reviewed and updated Current Outpatient Prescriptions  Medication Sig Dispense Refill  . allopurinol (ZYLOPRIM) 100 MG tablet TAKE 1 TABLET EVERY DAY 90 tablet 3  . amLODipine (NORVASC) 10 MG tablet TAKE 1 TABLET ONE TIME DAILY 90 tablet 1  . aspirin 81 MG chewable tablet Chew 81 mg by mouth daily.    . Blood Glucose Monitoring Suppl (BLOOD GLUCOSE METER) kit Use to test fasting glucose daily.  Diagnosis Type II diabetes 250.0 1 each 0  . chlorhexidine (PERIDEX) 0.12 % solution Use as directed 5 mLs in the mouth or throat 2 (two) times daily. Swish and spit after 30 seconds. 1892 mL 6  . chlorthalidone (HYGROTON) 25 MG tablet Take 1 tablet (25 mg total) by mouth daily. 90 tablet 1  . clindamycin (CLEOCIN) 150 MG capsule     . enalapril (VASOTEC) 20 MG tablet Take 2 tablets (40 mg total) by mouth daily. 180 tablet 1  . glucose blood (TRUETEST TEST) test strip Use to test blood sugar twice daily for type II diabetes (E11.29) 200 each 3  . HYDROcodone-acetaminophen (NORCO) 7.5-325 MG per tablet     . HYDROcodone-acetaminophen  (NORCO/VICODIN) 5-325 MG per tablet     . levothyroxine (SYNTHROID, LEVOTHROID) 75 MCG tablet Take 1 tablet (75 mcg total) by mouth daily. 90 tablet 1  . lovastatin (MEVACOR) 40 MG tablet TAKE 1 TABLET AT BEDTIME 90 tablet 1  . pioglitazone (ACTOS) 15 MG tablet Take 1 tablet (15 mg total) by mouth daily. 90 tablet 1  . PRODIGY LANCETS 26G MISC Use to check fasting blood glucose each morning.  Diagnosis: Diabetes Mellitus Type II 250.00 100 each 12  . spironolactone (ALDACTONE) 25 MG tablet Take 1 tablet (25 mg total) by mouth daily. 90 tablet 1  . tadalafil (CIALIS) 10 MG tablet Take 1 tablet (10 mg total) by mouth daily as needed for erectile dysfunction. 5 tablet 3  . Tdap (BOOSTRIX) 5-2.5-18.5 LF-MCG/0.5 injection Inject 0.5 mLs into the muscle once. 0.5 mL 0  . traMADol (ULTRAM) 50 MG tablet Take 1-3 tablets (50-150 mg total) by mouth every 8 (eight) hours as needed. 240 tablet 1  . traZODone (DESYREL) 100 MG tablet TAKE 1 TABLET AT BEDTIME 90 tablet 1   No current facility-administered medications for this visit.   Chief complaint-noted No additions to family history Social history- patient is a non smoker  Objective: BP 138/86 mmHg  Pulse 73  Temp(Src) 98.2 F (36.8 C) (Oral)  Ht 6' 1" (1.854 m)  Wt 218 lb (98.884 kg)  BMI 28.77 kg/m2 Gen: NAD, alert, cooperative with exam  Neck: FROM, supple CV: RRR, good S1/S2, no murmur, cap refill <3 Resp: CTABL, no wheezes, non-labored Ext: No edema, warm, normal tone, moves UE/LE spontaneously Diabetic Foot Exam: documented elsewhere  Skin: no rashes no lesions  Assessment/Plan: See problem based a/p  DM (diabetes mellitus), type 2 with renal complications I2M 7.7 improved significantly. Goal of 8.0  - Diet improved per patient, patient exercising as well-- plans to continue  - Continue Glimepiride  - Patient to return in 3 months - Diabetic Foot exam completed today, slight neuropathy in right foot compared to left.    Foot  lesion Small 4 mm lesion on 3rd right digit of foot. Positive for asymmetry, border irregularity, deep dark color, patient does not believe that he has ever seen this lesion before. - Will consider biopsy of lesion at follow up dermatology clinic

## 2015-03-24 NOTE — Assessment & Plan Note (Addendum)
A1C 7.7 improved significantly. Goal of 8.0  - Diet improved per patient, patient exercising as well-- plans to continue  - Continue Glimepiride  - Patient to return in 3 months - Diabetic Foot exam completed today, slight neuropathy in right foot compared to left.

## 2015-03-24 NOTE — Assessment & Plan Note (Signed)
Small 4 mm lesion on 3rd right digit of foot. Positive for asymmetry, border irregularity, deep dark color, patient does not believe that he has ever seen this lesion before. - Will consider biopsy of lesion at follow up dermatology clinic

## 2015-03-24 NOTE — Patient Instructions (Addendum)
Please follow up with dermatology clinic appointment to get 3rd digit of right foot biopsied at [facility]    Diabetic Neuropathy Diabetic neuropathy is a nerve disease or nerve damage that is caused by diabetes mellitus. About half of all people with diabetes mellitus have some form of nerve damage. Nerve damage is more common in those who have had diabetes mellitus for many years and who generally have not had good control of their blood sugar (glucose) level. Diabetic neuropathy is a common complication of diabetes mellitus. There are three more common types of diabetic neuropathy and a fourth type that is less common and less understood:   Peripheral neuropathy--This is the most common type of diabetic neuropathy. It causes damage to the nerves of the feet and legs first and then eventually the hands and arms.The damage affects the ability to sense touch.  Autonomic neuropathy--This type causes damage to the autonomic nervous system, which controls the following functions:  Heartbeat.  Body temperature.  Blood pressure.  Urination.  Digestion.  Sweating.  Sexual function.  Focal neuropathy--Focal neuropathy can be painful and unpredictable and occurs most often in older adults with diabetes mellitus. It involves a specific nerve or one area and often comes on suddenly. It usually does not cause long-term problems.  Radiculoplexus neuropathy-- Sometimes called lumbosacral radiculoplexus neuropathy, radiculoplexus neuropathy affects the nerves of the thighs, hips, buttocks, or legs. It is more common in people with type 2 diabetes mellitus and in older men. It is characterized by debilitating pain, weakness, and atrophy, usually in the thigh muscles. CAUSES  The cause of peripheral, autonomic, and focal neuropathies is diabetes mellitus that is uncontrolled and high glucose levels. The cause of radiculoplexus neuropathy is unknown. However, it is thought to be caused by  inflammation related to uncontrolled glucose levels. SIGNS AND SYMPTOMS  Peripheral Neuropathy Peripheral neuropathy develops slowly over time. When the nerves of the feet and legs no longer work there may be:   Burning, stabbing, or aching pain in the legs or feet.  Inability to feel pressure or pain in your feet. This can lead to:  Thick calluses over pressure areas.  Pressure sores.  Ulcers.  Foot deformities.  Reduced ability to feel temperature changes.  Muscle weakness. Autonomic Neuropathy The symptoms of autonomic neuropathy vary depending on which nerves are affected. Symptoms may include:  Problems with digestion, such as:  Feeling sick to your stomach (nausea).  Vomiting.  Bloating.  Constipation.  Diarrhea.  Abdominal pain.  Difficulty with urination. This occurs if you lose your ability to sense when your bladder is full. Problems include:  Urine leakage (incontinence).  Inability to empty your bladder completely (retention).  Rapid or irregular heartbeat (palpitations).  Blood pressure drops when you stand up (orthostatic hypotension). When you stand up you may feel:  Dizzy.  Weak.  Faint.  In men, inability to attain and maintain an erection.  In women, vaginal dryness and problems with decreased sexual desire and arousal.  Problems with body temperature regulation.  Increased or decreased sweating. Focal Neuropathy  Abnormal eye movements or abnormal alignment of both eyes.  Weakness in the wrist.  Foot drop. This results in an inability to lift the foot properly and abnormal walking or foot movement.  Paralysis on one side of your face (Bell palsy).  Chest or abdominal pain. Radiculoplexus Neuropathy  Sudden, severe pain in your hip, thigh, or buttocks.  Weakness and wasting of thigh muscles.  Difficulty rising from a  seated position.  Abdominal swelling.  Unexplained weight loss (usually more than 10 lb [4.5  kg]). DIAGNOSIS  Peripheral Neuropathy Your senses may be tested. Sensory function testing can be done with:  A light touch using a monofilament.  A vibration with tuning fork.  A sharp sensation with a pin prick. Other tests that can help diagnose neuropathy are:  Nerve conduction velocity. This test checks the transmission of an electrical current through a nerve.  Electromyography. This shows how muscles respond to electrical signals transmitted by nearby nerves.  Quantitative sensory testing. This is used to assess how your nerves respond to vibrations and changes in temperature. Autonomic Neuropathy Diagnosis is often based on reported symptoms. Tell your health care provider if you experience:   Dizziness.   Constipation.   Diarrhea.   Inappropriate urination or inability to urinate.   Inability to get or maintain an erection.  Tests that may be done include:   Electrocardiography or Holter monitor. These are tests that can help show problems with the heart rate or heart rhythm.   An X-ray exam may be done. Focal Neuropathy Diagnosis is made based on your symptoms and what your health care provider finds during your exam. Other tests may be done. They may include:  Nerve conduction velocities. This checks the transmission of electrical current through a nerve.  Electromyography. This shows how muscles respond to electrical signals transmitted by nearby nerves.  Quantitative sensory testing. This test is used to assess how your nerves respond to vibration and changes in temperature. Radiculoplexus Neuropathy  Often the first thing is to eliminate any other issue or problems that might be the cause, as there is no stick test for diagnosis.  X-ray exam of your spine and lumbar region.  Spinal tap to rule out cancer.  MRI to rule out other lesions. TREATMENT  Once nerve damage occurs, it cannot be reversed. The goal of treatment is to keep the disease or  nerve damage from getting worse and affecting more nerve fibers. Controlling your blood glucose level is the key. Most people with radiculoplexus neuropathy see at least a partial improvement over time. You will need to keep your blood glucose and HbA1c levels in the target range determined by your health care provider. Things that help control blood glucose levels include:   Blood glucose monitoring.   Meal planning.   Physical activity.   Diabetes medicine.  Over time, maintaining lower blood glucose levels helps lessen symptoms. Sometimes, prescription pain medicine is needed. HOME CARE INSTRUCTIONS:  Do not smoke.  Keep your blood glucose level in the range that you and your health care provider have determined acceptable for you.  Keep your blood pressure level in the range that you and your health care provider have determined acceptable for you.  Eat a well-balanced diet.  Be active every day.  Check your feet every day. SEEK MEDICAL CARE IF:   You have burning, stabbing, or aching pain in the legs or feet.  You are unable to feel pressure or pain in your feet.  You develop problems with digestion such as:  Nausea.  Vomiting.  Bloating.  Constipation.  Diarrhea.  Abdominal pain.  You have difficulty with urination, such as:  Incontinence.  Retention.  You have palpitations.  You develop orthostatic hypotension. When you stand up you may feel:  Dizzy.  Weak.  Faint.  You cannot attain and maintain an erection (in men).  You have vaginal dryness and problems with decreased  sexual desire and arousal (in women).  You have severe pain in your thighs, legs, or buttocks.  You have unexplained weight loss. Document Released: 09/03/2001 Document Revised: 04/15/2013 Document Reviewed: 12/04/2012 [facility] Patient Information 2015 [place], [place]. This information is not intended to replace advice given to you by your health care provider. Make sure  you discuss any questions you have with your health care provider.

## 2015-04-20 ENCOUNTER — Ambulatory Visit (INDEPENDENT_AMBULATORY_CARE_PROVIDER_SITE_OTHER): Payer: Commercial Managed Care - HMO | Admitting: Obstetrics and Gynecology

## 2015-04-20 ENCOUNTER — Ambulatory Visit (INDEPENDENT_AMBULATORY_CARE_PROVIDER_SITE_OTHER): Payer: Commercial Managed Care - HMO | Admitting: Family Medicine

## 2015-04-20 VITALS — BP 104/62 | HR 63 | Temp 98.7°F | Ht 73.0 in | Wt 212.2 lb

## 2015-04-20 DIAGNOSIS — D2271 Melanocytic nevi of right lower limb, including hip: Secondary | ICD-10-CM | POA: Diagnosis not present

## 2015-04-20 DIAGNOSIS — R42 Dizziness and giddiness: Secondary | ICD-10-CM

## 2015-04-20 DIAGNOSIS — D2270 Melanocytic nevi of unspecified lower limb, including hip: Secondary | ICD-10-CM | POA: Insufficient documentation

## 2015-04-20 DIAGNOSIS — E1122 Type 2 diabetes mellitus with diabetic chronic kidney disease: Secondary | ICD-10-CM | POA: Diagnosis not present

## 2015-04-20 DIAGNOSIS — I10 Essential (primary) hypertension: Secondary | ICD-10-CM

## 2015-04-20 LAB — BASIC METABOLIC PANEL
BUN: 33 mg/dL — AB (ref 7–25)
CO2: 25 mmol/L (ref 20–31)
Calcium: 9.6 mg/dL (ref 8.6–10.3)
Chloride: 95 mmol/L — ABNORMAL LOW (ref 98–110)
Creat: 2.05 mg/dL — ABNORMAL HIGH (ref 0.70–1.33)
GLUCOSE: 157 mg/dL — AB (ref 65–99)
Potassium: 4.9 mmol/L (ref 3.5–5.3)
Sodium: 128 mmol/L — ABNORMAL LOW (ref 135–146)

## 2015-04-20 LAB — GLUCOSE, CAPILLARY: GLUCOSE-CAPILLARY: 194 mg/dL — AB (ref 65–99)

## 2015-04-20 NOTE — Assessment & Plan Note (Addendum)
Likely due to orthostatic hypotension. Orthostatic BP measures shows drop in both systolic and diastolic BP , more positive with diastolic than systolic. HR also slightly increased although less than 10 points. Spironolactone and Chlorthalidone could be contributing to his dizziness. His BP goal should be less than 140/80 but he is actually pretty low today. Plan to hold Aldactone for now. Continue Chlorthalidone and Lisinopril. Continue home BP monitoring, call if still low or running high after BP meds adjustment. Return to see PCP in 1-2 wks for reassessment or sooner if worsening symptoms. Note his socium level was low during recent lab, this could be contributing to his dizziness as well. Bmet rechecked.

## 2015-04-20 NOTE — Progress Notes (Addendum)
Subjective:     Patient ID: Andrew [name], male   DOB: 11-29-1955, 59 y.o.   MRN: [id number]  Dizziness This is a new problem. The current episode started 1 to 4 weeks ago (started 2 wks ago after dental work. Dizziness worsens after he takes his BP med.). Episode frequency: twice daily. The problem has been gradually worsening. Associated symptoms include fatigue, headaches and a visual change. Pertinent negatives include no chest pain, fever, nausea, vertigo or vomiting. Exacerbated by: After taking BP meds it is worse. He has tried nothing (He feels better when he does not take his meds.) for the symptoms. The treatment provided mild relief.  Hypertension This is a chronic problem. The current episode started more than 1 year ago (10 yrs). Progression since onset: Stable. Associated symptoms include headaches. Pertinent negatives include no chest pain. Past treatments include ACE inhibitors, calcium channel blockers and diuretics. The current treatment provides moderate improvement. There are no compliance problems.   Diabetes He presents for his follow-up diabetic visit. He has type 2 diabetes mellitus. Hypoglycemia symptoms include dizziness and headaches. Associated symptoms include fatigue and visual change. Pertinent negatives for diabetes include no chest pain.  CBG running high at 160 at home but today it is 190. He has been on Actos 15 mg for over 3 months. Lesion on foot: Here for freckle on his right first and 3rd toe which has been there for many years almost all his life. He denies change in size or color, he denies pain. No itching.  Current Outpatient Prescriptions on File Prior to Visit  Medication Sig Dispense Refill  . allopurinol (ZYLOPRIM) 100 MG tablet TAKE 1 TABLET EVERY DAY 90 tablet 3  . amLODipine (NORVASC) 10 MG tablet TAKE 1 TABLET ONE TIME DAILY 90 tablet 1  . aspirin 81 MG chewable tablet Chew 81 mg by mouth daily.    . chlorthalidone (HYGROTON) 25 MG tablet Take  1 tablet (25 mg total) by mouth daily. 90 tablet 1  . enalapril (VASOTEC) 20 MG tablet Take 2 tablets (40 mg total) by mouth daily. 180 tablet 1  . levothyroxine (SYNTHROID, LEVOTHROID) 75 MCG tablet Take 1 tablet (75 mcg total) by mouth daily. 90 tablet 1  . lovastatin (MEVACOR) 40 MG tablet TAKE 1 TABLET AT BEDTIME 90 tablet 1  . pioglitazone (ACTOS) 15 MG tablet Take 1 tablet (15 mg total) by mouth daily. 90 tablet 1  . spironolactone (ALDACTONE) 25 MG tablet Take 1 tablet (25 mg total) by mouth daily. 90 tablet 1  . traMADol (ULTRAM) 50 MG tablet Take 1-3 tablets (50-150 mg total) by mouth every 8 (eight) hours as needed. 240 tablet 1  . traZODone (DESYREL) 100 MG tablet TAKE 1 TABLET AT BEDTIME 90 tablet 1  . Blood Glucose Monitoring Suppl (BLOOD GLUCOSE METER) kit Use to test fasting glucose daily.  Diagnosis Type II diabetes 250.0 1 each 0  . chlorhexidine (PERIDEX) 0.12 % solution Use as directed 5 mLs in the mouth or throat 2 (two) times daily. Swish and spit after 30 seconds. 1892 mL 6  . clindamycin (CLEOCIN) 150 MG capsule     . glucose blood (TRUETEST TEST) test strip Use to test blood sugar twice daily for type II diabetes (E11.29) 200 each 3  . HYDROcodone-acetaminophen (NORCO) 7.5-325 MG per tablet     . HYDROcodone-acetaminophen (NORCO/VICODIN) 5-325 MG per tablet     . PRODIGY LANCETS 26G MISC Use to check fasting blood glucose each morning.  Diagnosis:  Diabetes Mellitus Type II 250.00 100 each 12  . tadalafil (CIALIS) 10 MG tablet Take 1 tablet (10 mg total) by mouth daily as needed for erectile dysfunction. 5 tablet 3  . Tdap (BOOSTRIX) 5-2.5-18.5 LF-MCG/0.5 injection Inject 0.5 mLs into the muscle once. 0.5 mL 0   No current facility-administered medications on file prior to visit.   Past Medical History  Diagnosis Date  . CRD (chronic renal disease), stage II     Baseline Cr 1.2  . Prostatitis     hx of x2  . HTN (hypertension)   . HLD (hyperlipidemia)   . T2DM (type  2 diabetes mellitus) ([place])   . CVA (cerebral vascular accident) ([place])     x4 last one in 2007, R sided residual weakness  . Sarcoidosis ([place])   . Arthritis   . Leg swelling   . HAMMER TOE 07/25/2010  . Lipoma of back 03/18/2011  . PSEUDOFOLLICULITIS BARBAE 09/07/9516    Qualifier: Diagnosis of  By: Danise Mina  MD, Garlon Hatchet    . Personal history of colonic adenomas 04/01/2013     Review of Systems  Constitutional: Positive for fatigue. Negative for fever.  Cardiovascular: Negative for chest pain.  Gastrointestinal: Negative for nausea and vomiting.  Skin:       Skin lesion  Neurological: Positive for dizziness and headaches. Negative for vertigo.  All other systems reviewed and are negative.      Objective:   Physical Exam  Constitutional: He is oriented to person, place, and time. He appears well-developed. No distress.  Cardiovascular: Normal rate, regular rhythm and normal heart sounds.   No murmur heard. Pulmonary/Chest: Effort normal and breath sounds normal. No respiratory distress. He has no wheezes.  Abdominal: Soft. Bowel sounds are normal. He exhibits no distension and no mass. There is no tenderness.  Musculoskeletal: Normal range of motion. He exhibits no edema.  Neurological: He is alert and oriented to person, place, and time.  Skin: Skin is warm. No rash noted.     Nursing note and vitals reviewed.    Patient gave verbal consent to take picture.    Assessment:     Dizziness: orthostatic Hypotension HTN DM2 Benign Acral Nevus     Plan:     Check problem list.     Addendum: l called patient to discuss his lab result from yesterday. His Sodium level is still low but better. His serum glucose is elevated but okay, BUN/Cr worsened since last test 3 months ago.  1. Hyponatremia likely related to diuretics ( Chlorthalidone) Patient advised to hold medication for now. He was previously started on Chlorthalidone to control his potassium level since he is on  Enalapril and Spironolactone which can both cause hyperkalemia.    Patient will continue Enalapril for now and take 1/2 dose of his spironolactone as opposed to stopping it all together to achieve BP control.  2. No change needed for now for his DM 2 regimen. Reassess A1C in 3 months.  3. Kidney function worsened. BUN/Cr less than 20 but there is likely some elements of dehydration from his diuretic use ( Chlorthalidone and Spironolactone).     Hold Chlorthalidone for now, take half Spironolactone. Continue Enalapril for now for BP control. Continue Norvasc as well. 4. Patient informed of follow up appointment scheduled with his PCP on 05/06/15 at 3:15 pm. Plan will be to recheck BP. If no improvement in Kidney function, to cut back on Enalapril and adjust his other antihypertensive agent for BP  optimization.  Patient verbalized understanding, he read back all instructions given him and agreed with plan.    Results for orders placed or performed in visit on 04/20/15 (from the past 24 hour(s))  Glucose, capillary     Status: Abnormal   Collection Time: 04/20/15  1:48 PM  Result Value Ref Range   Glucose-Capillary 194 (H) 65 - 99 mg/dL  Basic metabolic panel     Status: Abnormal   Collection Time: 04/20/15  3:07 PM  Result Value Ref Range   Sodium 128 (L) 135 - 146 mmol/L   Potassium 4.9 3.5 - 5.3 mmol/L   Chloride 95 (L) 98 - 110 mmol/L   CO2 25 20 - 31 mmol/L   Glucose, Bld 157 (H) 65 - 99 mg/dL   BUN 33 (H) 7 - 25 mg/dL   Creat 2.05 (H) 0.70 - 1.33 mg/dL   Calcium 9.6 8.6 - 10.3 mg/dL   Narrative   Performed at:  [address]                [place], [place] [address]

## 2015-04-20 NOTE — Patient Instructions (Signed)
It was nice seeing you today. I am sorry you don't feel well after taking your BP medication. After checking your vital signs today, it seems the problem with your dizziness is likely related to too low BP or postural hypotension. I will like to recommend that you hold your Aldactone for now. Continue other blood pressure medication. Continue home BP check and keep BP around 140/80. Follow up in 1-2 wks with your PCP for reassessment. I will not change your glucose medication for now since your recent A1C was normal. Your high glucose might be due to eating too much carbohydrate so please cut back on this. The lesion on your toes looks very benign,biopsy is not needed. Please return in 2 months for reassessment or if there is a change in color or size. We will like to do a blood test today for your sodium.

## 2015-04-20 NOTE — Assessment & Plan Note (Signed)
Home CBG runs around 160s which is the highest he had. Here at the clinic his CBG was 190, he just had a high carb lunch. He denies low CBG. Recent A1C 1 month ago was 7.7 which is a drop from 9.9 three months earlier. For now I don't feel the need to adjust his regimen. Return in 2 months for A1C and continue home CBG monitoring. Cut back on carbs.

## 2015-04-20 NOTE — Progress Notes (Signed)
Patient seen by doctor Eniola. Please review her note for details. Unable to close encounter.

## 2015-04-20 NOTE — Assessment & Plan Note (Signed)
Benign Acral nevus. Per patient it has been there all his life with no change. It looks very benign to me. No biopsy warranted. Patient advised to monitor for 2 months. Return for reassessment then and compare picture as above. If no changes then to reassess in few month and routinely for 1 yr. If no change in 1 year I will leave it. Patient agreed with plan and verbalized understanding.

## 2015-04-20 NOTE — Assessment & Plan Note (Addendum)
BP pretty low with positive orthostatic hypotension. Aldactone held till he sees his PCP back. Continue other BP agents and follow up as instructed. Note sodium level was 125 recently, this could be due to Chlorthalidone. Bmet rechecked today. I will call with result and consider dose adjustment for his Chlorthalidone

## 2015-04-21 ENCOUNTER — Telehealth: Payer: Self-pay | Admitting: Family Medicine

## 2015-04-21 NOTE — Telephone Encounter (Signed)
l called patient to discuss his lab result from yesterday. His Sodium level is still low but better. His serum glucose is elevated but okay, BUN/Cr worsened since last test 3 months ago.  1. Hyponatremia likely related to diuretics ( Chlorthalidone) Patient advised to hold medication for now. He was previously started on Chlorthalidone to control his potassium level since he is on Enalapril and Spironolactone which can both cause hyperkalemia.    Patient will continue Enalapril for now and take 1/2 dose of his spironolactone as opposed to stopping it all together to achieve BP control.  2. No change needed for now for his DM 2 regimen. Reassess A1C in 3 months.  3. Kidney function worsened. BUN/Cr less than 20 but there is likely some elements of dehydration from his diuretic use ( Chlorthalidone and Spironolactone).     Hold Chlorthalidone for now, take half Spironolactone. Continue Enalapril for now for BP control. Continue Norvasc as well. 4. Patient informed of follow up appointment scheduled with his PCP on 05/06/15 at 3:15 pm. Plan will be to recheck BP. If no improvement in Kidney function, to cut back on Enalapril and adjust his other antihypertensive agent for BP optimization.  Patient verbalized understanding, he read back all instructions given him and agreed with plan.    Results for orders placed or performed in visit on 04/20/15 (from the past 24 hour(s))  Glucose, capillary     Status: Abnormal   Collection Time: 04/20/15  1:48 PM  Result Value Ref Range   Glucose-Capillary 194 (H) 65 - 99 mg/dL  Basic metabolic panel     Status: Abnormal   Collection Time: 04/20/15  3:07 PM  Result Value Ref Range   Sodium 128 (L) 135 - 146 mmol/L   Potassium 4.9 3.5 - 5.3 mmol/L   Chloride 95 (L) 98 - 110 mmol/L   CO2 25 20 - 31 mmol/L   Glucose, Bld 157 (H) 65 - 99 mg/dL   BUN 33 (H) 7 - 25 mg/dL   Creat 2.05 (H) 0.70 - 1.33 mg/dL   Calcium 9.6 8.6 - 10.3 mg/dL   Narrative   Performed at:  [address], [address]                [place], [place] [address]

## 2015-05-06 ENCOUNTER — Ambulatory Visit: Payer: Commercial Managed Care - HMO | Admitting: Family Medicine

## 2015-05-10 ENCOUNTER — Encounter: Payer: Self-pay | Admitting: Internal Medicine

## 2015-05-10 ENCOUNTER — Ambulatory Visit (INDEPENDENT_AMBULATORY_CARE_PROVIDER_SITE_OTHER): Payer: Commercial Managed Care - HMO | Admitting: Internal Medicine

## 2015-05-10 VITALS — BP 122/70 | HR 82 | Temp 98.5°F | Ht 73.0 in | Wt 219.0 lb

## 2015-05-10 DIAGNOSIS — E1122 Type 2 diabetes mellitus with diabetic chronic kidney disease: Secondary | ICD-10-CM

## 2015-05-10 DIAGNOSIS — I10 Essential (primary) hypertension: Secondary | ICD-10-CM | POA: Diagnosis not present

## 2015-05-10 DIAGNOSIS — E039 Hypothyroidism, unspecified: Secondary | ICD-10-CM

## 2015-05-10 DIAGNOSIS — R42 Dizziness and giddiness: Secondary | ICD-10-CM

## 2015-05-10 DIAGNOSIS — R748 Abnormal levels of other serum enzymes: Secondary | ICD-10-CM

## 2015-05-10 DIAGNOSIS — Z23 Encounter for immunization: Secondary | ICD-10-CM

## 2015-05-10 DIAGNOSIS — R7989 Other specified abnormal findings of blood chemistry: Secondary | ICD-10-CM

## 2015-05-10 DIAGNOSIS — K5900 Constipation, unspecified: Secondary | ICD-10-CM

## 2015-05-10 MED ORDER — TRAZODONE HCL 100 MG PO TABS: 100.0000 mg | ORAL_TABLET | Freq: Every day | ORAL | Status: DC

## 2015-05-10 MED ORDER — PIOGLITAZONE HCL 15 MG PO TABS: 15.0000 mg | ORAL_TABLET | Freq: Every day | ORAL | Status: DC

## 2015-05-10 MED ORDER — LEVOTHYROXINE SODIUM 75 MCG PO TABS: 75.0000 ug | ORAL_TABLET | Freq: Every day | ORAL | Status: DC

## 2015-05-10 MED ORDER — SPIRONOLACTONE 25 MG PO TABS: 12.5000 mg | ORAL_TABLET | Freq: Every day | ORAL | Status: DC

## 2015-05-10 MED ORDER — LOVASTATIN 40 MG PO TABS: ORAL_TABLET | ORAL | Status: DC

## 2015-05-10 MED ORDER — GLUCOSE BLOOD VI STRP: ORAL_STRIP | Status: DC

## 2015-05-10 MED ORDER — AMLODIPINE BESYLATE 10 MG PO TABS: ORAL_TABLET | ORAL | Status: DC

## 2015-05-10 MED ORDER — ALLOPURINOL 100 MG PO TABS: 100.0000 mg | ORAL_TABLET | Freq: Every day | ORAL | Status: DC

## 2015-05-10 MED ORDER — ENALAPRIL MALEATE 20 MG PO TABS: 40.0000 mg | ORAL_TABLET | Freq: Every day | ORAL | Status: DC

## 2015-05-10 NOTE — Addendum Note (Signed)
Addended by: Christen Bame D on: 05/10/2015 02:58 PM   Modules accepted: Orders

## 2015-05-10 NOTE — Assessment & Plan Note (Signed)
Patient denies any dizziness after stopping chlorthalidone - Resolved

## 2015-05-10 NOTE — Assessment & Plan Note (Addendum)
Patient-reported blood glucose measurements within appropriate range.  - Continue Actos - Repeat A1C at next visit  - Consider counseling patient on checking blood glucose less (is currently checking three times per day) - Will recheck Cr today given elevation at last appt

## 2015-05-10 NOTE — Progress Notes (Signed)
   Subjective:    Patient ID: Andrew [name], male    DOB: February 20, 1956, 59 y.o.   MRN: [id number]  HPI  Andrew [name] is a 59 yo M with PMH of HTN, Type II DM, and sarcoidosis, among other conditions, presenting for follow-up.   Andrew [name] was previously seen for a complaint of dizziness worse after taking chlorthalidone. At his last visit, chlorthalidone was stopped, and he reports that he has not been dizzy at all since then. He reports that he generally feels much better now that he is off the medication. To avoid hyperkalemia, his spironolactone was cut in half to 12.5 mg qd when chlorthalidone was stopped.   He was also found to have elevated Cr (2.05) at his last visit. It was thought that this may be due at least in part to dehydration from diuretic use. As a result, he has been drinking much more water since his last appointment, and has stopped drinking soda.   Concerning diabetes, Andrew [name] continues to check his blood sugar at least twice a day. He checks his blood sugar every morning, and rechecks it at lunch if the AM reading is >160. He also checks his blood sugar every night. He normally ranges between 115-135. He continues to walk for exercise. He denies dizziness, diaphoresis, or any other symptoms of hypoglycemia.   His only complaint today is constipation. He has been taking an over the counter stool softener daily, and reports that this is helping with the symptoms. When taking the stool softener, he typically has a bowel movement every morning and evening.    Review of Systems  Constitutional: Negative for diaphoresis.  Gastrointestinal: Positive for constipation and abdominal distention (when constipated). Negative for diarrhea.  Endocrine: Negative for polydipsia and polyuria.  Neurological: Negative for dizziness, weakness and light-headedness.       Objective:   Physical Exam  Constitutional: He is oriented to person, place, and time. He appears well-developed and  well-nourished. No distress.  HENT:  Head: Normocephalic and atraumatic.  Cardiovascular: Normal rate, regular rhythm and normal heart sounds.   No murmur heard. Pulmonary/Chest: Effort normal and breath sounds normal. No respiratory distress. He has no wheezes.  Abdominal: Soft. Bowel sounds are normal. He exhibits no distension. There is no tenderness.  Neurological: He is alert and oriented to person, place, and time.  Skin: He is not diaphoretic.  Psychiatric: He has a normal mood and affect. His behavior is normal.  Vitals reviewed.         Assessment & Plan:  Essential hypertension BP 122/70 today.  - Continue current medication regimen  DM (diabetes mellitus), type 2 with renal complications Patient-reported blood glucose measurements within appropriate range.  - Continue Actos - Repeat A1C at next visit  - Consider counseling patient on checking blood glucose less (is currently checking three times per day) - Will recheck Cr today given elevation at last appt  Dizziness Patient denies any dizziness after stopping chlorthalidone - Resolved  Elevated serum creatinine Have stopped chlorthalidone and patient has increased daily water intake - Recheck today and make medication changes accordingly if necessary   Constipation Well-controlled when patient takes OTC stool softener - Continue current stool softener. No need for additional meds at this time   Andrew Hector, MD PGY-1 [facility]

## 2015-05-10 NOTE — Assessment & Plan Note (Signed)
BP 122/70 today.  - Continue current medication regimen

## 2015-05-10 NOTE — Patient Instructions (Addendum)
It was very nice meeting you today Mr. Rogus.  We are not making any changes in your medication today. Your blood pressure and diabetes both seem to be under control. Keep up the great work!  I will call you with the results of your lab work, and we will discuss at that time if we need to make any changes to your medications.   If you have any questions in the meantime, please do not hesitate to call.   Be well, Dr. Avon Gully

## 2015-05-10 NOTE — Assessment & Plan Note (Signed)
Well-controlled when patient takes OTC stool softener - Continue current stool softener. No need for additional meds at this time

## 2015-05-10 NOTE — Assessment & Plan Note (Signed)
Have stopped chlorthalidone and patient has increased daily water intake - Recheck today and make medication changes accordingly if necessary

## 2015-05-11 LAB — BASIC METABOLIC PANEL WITH GFR
BUN: 19 mg/dL (ref 7–25)
CALCIUM: 9.1 mg/dL (ref 8.6–10.3)
CO2: 25 mmol/L (ref 20–31)
CREATININE: 1.59 mg/dL — AB (ref 0.70–1.33)
Chloride: 99 mmol/L (ref 98–110)
GFR, Est African American: 54 mL/min — ABNORMAL LOW (ref >=60)
GFR, Est Non African American: 47 mL/min — ABNORMAL LOW (ref >=60)
Glucose, Bld: 179 mg/dL — ABNORMAL HIGH (ref 65–99)
Potassium: 4 mmol/L (ref 3.5–5.3)
Sodium: 133 mmol/L — ABNORMAL LOW (ref 135–146)

## 2015-06-16 ENCOUNTER — Other Ambulatory Visit: Payer: Self-pay | Admitting: Family Medicine

## 2015-06-16 NOTE — Telephone Encounter (Signed)
Pt called because he needs a 3 month refill on his Tramadol sent to his pharmacy. jw

## 2015-06-16 NOTE — Telephone Encounter (Signed)
I have never prescribed him tramadol and I do not have a pain contract with him. Please have him follow up in clinic with myself or another provider to discuss why he needs this.  Thanks, Archie Patten, MD [facility] Resident  06/16/2015, 5:40 PM

## 2015-06-17 NOTE — Telephone Encounter (Signed)
Appointment scheduled for 12/21. °

## 2015-06-23 ENCOUNTER — Ambulatory Visit: Payer: Commercial Managed Care - HMO | Admitting: Family Medicine

## 2015-06-27 ENCOUNTER — Ambulatory Visit: Payer: Commercial Managed Care - HMO | Admitting: Family Medicine

## 2015-06-29 ENCOUNTER — Encounter: Payer: Self-pay | Admitting: Family Medicine

## 2015-06-29 ENCOUNTER — Ambulatory Visit (INDEPENDENT_AMBULATORY_CARE_PROVIDER_SITE_OTHER): Payer: Commercial Managed Care - HMO | Admitting: Family Medicine

## 2015-06-29 VITALS — BP 118/60 | HR 74 | Temp 97.8°F | Ht 73.0 in | Wt 213.0 lb

## 2015-06-29 DIAGNOSIS — I10 Essential (primary) hypertension: Secondary | ICD-10-CM

## 2015-06-29 DIAGNOSIS — E1122 Type 2 diabetes mellitus with diabetic chronic kidney disease: Secondary | ICD-10-CM

## 2015-06-29 DIAGNOSIS — N179 Acute kidney failure, unspecified: Secondary | ICD-10-CM

## 2015-06-29 DIAGNOSIS — M2550 Pain in unspecified joint: Secondary | ICD-10-CM

## 2015-06-29 LAB — POCT GLYCOSYLATED HEMOGLOBIN (HGB A1C): Hemoglobin A1C: 6.4

## 2015-06-29 MED ORDER — TRAMADOL HCL 50 MG PO TABS: 50.0000 mg | ORAL_TABLET | Freq: Three times a day (TID) | ORAL | Status: DC | PRN

## 2015-06-29 MED ORDER — CYCLOBENZAPRINE HCL 10 MG PO TABS: 10.0000 mg | ORAL_TABLET | Freq: Every evening | ORAL | Status: DC | PRN

## 2015-06-29 NOTE — Progress Notes (Signed)
Patient ID: Andrew [name], male   DOB: Apr 04, 1956, 59 y.o.   MRN: [id number]    Subjective: CC: hip pain HPI: Patient is a 59 y.o. male with a past medical history of T2DM, CKD, and HTN presenting to clinic today for f/u HTN and concerns for hip pain.  Hip pain Patient has a reported h/o arthritis. Notes since it has gotten cold he has had right hip pain that radiates in the back of his thigh. Denies shock/stabbing pain, notes its more like aching pain. No numbness or tingling. No urinary bowel incontinence. Only bothers him when it is cold. No swelling or erythema noted. Has used tramadol in the past with significant improvement. Has a h/o CKD (and brother on HD) therefore he's worried about doing other medications other than tramadol.   Hypertension Blood pressure at home: doesn't check.  Blood pressure today: 118/60 Meds: Compliant with spiro and enalapril, chlorthalidone was d/c in the past due to hyponatremia.  Side effects: none  ROS: Denies headache, dizziness, visual changes, nausea, vomiting, chest pain, abdominal pain or shortness of breath.   Social History: never smoker   ROS: All other systems reviewed and are negative.  Past Medical History Patient Active Problem List   Diagnosis Date Noted  . Constipation 05/10/2015  . Dizziness 04/20/2015  . Nevus of toe 04/20/2015  . Foot lesion 03/24/2015  . Bilateral foot pain 10/26/2014  . Impairment of balance 09/02/2014  . History of stroke 09/02/2014  . Tooth pain 01/28/2014  . De Quervain's tenosynovitis, right 11/05/2013  . Pinguecula of both eyes 09/23/2013  . Chronic hyponatremia 09/23/2013  . Elevated serum creatinine 05/25/2013  . Hypothyroidism 05/25/2013  . Memory loss 05/21/2013  . Rash and nonspecific skin eruption 05/21/2013  . Personal history of colonic adenomas 04/01/2013  . Unspecified constipation 01/13/2013  . Joint pain 05/06/2011  . OBESITY, UNSPECIFIED 05/20/2009  . Knee pain, bilateral  04/28/2009  . LUMBAGO 03/25/2009  . PSEUDOFOLLICULITIS BARBAE 68/06/7516  . SARCOIDOSIS 02/20/2007  . Hyperlipidemia associated with type 2 diabetes mellitus ([place]) 02/20/2007  . DM (diabetes mellitus), type 2 with renal complications ([place]) 00/17/4944  . ERECTILE DYSFUNCTION 10/18/2006  . 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 Essential hypertension 10/18/2006  . CKD (chronic kidney disease) stage 3, GFR 30-59 ml/min 09/05/2006    Medications- reviewed and updated Current Outpatient Prescriptions  Medication Sig Dispense Refill  . allopurinol (ZYLOPRIM) 100 MG tablet Take 1 tablet (100 mg total) by mouth daily. 90 tablet 3  . amLODipine (NORVASC) 10 MG tablet TAKE 1 TABLET ONE TIME DAILY 90 tablet 1  . aspirin 81 MG chewable tablet Chew 81 mg by mouth daily.    . Blood Glucose Monitoring Suppl (BLOOD GLUCOSE METER) kit Use to test fasting glucose daily.  Diagnosis Type II diabetes 250.0 1 each 0  . cyclobenzaprine (FLEXERIL) 10 MG tablet Take 1 tablet (10 mg total) by mouth at bedtime as needed for muscle spasms. 30 tablet 0  . enalapril (VASOTEC) 20 MG tablet Take 2 tablets (40 mg total) by mouth daily. 180 tablet 1  . glucose blood (TRUETEST TEST) test strip Use to test blood sugar twice daily for type II diabetes (E11.29) 200 each 3  . levothyroxine (SYNTHROID, LEVOTHROID) 75 MCG tablet Take 1 tablet (75 mcg total) by mouth daily. 90 tablet 1  . lovastatin (MEVACOR) 40 MG tablet TAKE 1 TABLET AT BEDTIME 90 tablet 1  . pioglitazone (ACTOS) 15 MG tablet Take 1 tablet (15 mg  total) by mouth daily. 90 tablet 1  . PRODIGY LANCETS 26G MISC Use to check fasting blood glucose each morning.  Diagnosis: Diabetes Mellitus Type II 250.00 100 each 12  . spironolactone (ALDACTONE) 25 MG tablet Take 0.5 tablets (12.5 mg total) by mouth daily. 90 tablet 1  . tadalafil (CIALIS) 10 MG tablet Take 1 tablet (10 mg total) by mouth daily as needed for erectile dysfunction. 5 tablet 3  .  Tdap (BOOSTRIX) 5-2.5-18.5 LF-MCG/0.5 injection Inject 0.5 mLs into the muscle once. 0.5 mL 0  . traMADol (ULTRAM) 50 MG tablet Take 1 tablet (50 mg total) by mouth every 8 (eight) hours as needed. 90 tablet 0  . traZODone (DESYREL) 100 MG tablet Take 1 tablet (100 mg total) by mouth at bedtime. 90 tablet 1   No current facility-administered medications for this visit.    Objective: Office vital signs reviewed. BP 118/60 mmHg  Pulse 74  Temp(Src) 97.8 F (36.6 C) (Oral)  Ht 6' 1" (1.854 m)  Wt 213 lb (96.616 kg)  BMI 28.11 kg/m2  SpO2 98%   Physical Examination:  General: Awake, alert, well- nourished, NAD Cardio: RRR, no m/r/g noted. No pitting edema.  Pulm: No increased WOB.  CTAB, without wheezes, rhonchi or crackles noted.  MSK: No swelling or erythema noted over the R hip. TTP over the right hip diffusely. Decreased ROM in the R hip. Negative SLR.  Normal gait and station Skin: dry, intact, no rashes or lesions Neuro: 5/5 strength in hip flexion bilaterally, 4/5 in knee flexion/extension on the R, 5/5 on the L. Sensation intact grossly to light touch.  A1c 6.4   Assessment/Plan: Joint pain Most prominent in the left hip currently, but notes a h/o bilateral knee involvement. Uric acid and RF, and ANA have been negative in the past. Fortunately, there is no urinary/bowel incontinence.  - Discussed taking tylenol as needed for pain. - Tramadol 72m q8hrs PRN, #90 - if no improvement, will need PT in the future. - could consider imaging vs referral to sports medicine for an injection in the future.   Essential hypertension BP at goal. - Continue enalapril and spironolactone.  - BMET today to ensure continued improvement/stable Na and creatinine.     Orders Placed This Encounter  Procedures  . Basic Metabolic Panel  . HgB A1c    Meds ordered this encounter  Medications  . traMADol (ULTRAM) 50 MG tablet    Sig: Take 1 tablet (50 mg total) by mouth every 8 (eight)  hours as needed.    Dispense:  90 tablet    Refill:  0  . cyclobenzaprine (FLEXERIL) 10 MG tablet    Sig: Take 1 tablet (10 mg total) by mouth at bedtime as needed for muscle spasms.    Dispense:  30 tablet    [place]:  [id number]

## 2015-06-29 NOTE — Assessment & Plan Note (Signed)
BP at goal. - Continue enalapril and spironolactone.  - BMET today to ensure continued improvement/stable Na and creatinine.

## 2015-06-29 NOTE — Assessment & Plan Note (Signed)
Most prominent in the left hip currently, but notes a h/o bilateral knee involvement. Uric acid and RF, and ANA have been negative in the past. Fortunately, there is no urinary/bowel incontinence.  - Discussed taking tylenol as needed for pain. - Tramadol 50mg  q8hrs PRN, #90 - if no improvement, will need PT in the future. - could consider imaging vs referral to sports medicine for an injection in the future.

## 2015-06-29 NOTE — Patient Instructions (Signed)
At your last visit, your kidneys seemed to be improving but were not normal. If your kidneys are getting better or stable, I will mail you the results, otherwise I will call you. I have prescribed tramadol for your hip pain- if you get new symptoms like numbness in your groin/buttocks region or urinary incontinence, please seek medical assistance, that would make me more concerned that your pain was originating from your spinal cord rather than your hip. Try Flexeril at bedtime for the lower back pain as well and see if it helps- it can make you sleepy!  You can take Tylenol (acetaminophen) as well for the pain so you're not taking so much tramadol.  If your symptoms do not improve we may need to have to go to physical therapy.  Osteoarthritis Osteoarthritis is a disease that causes soreness and inflammation of a joint. It occurs when the cartilage at the affected joint wears down. Cartilage acts as a cushion, covering the ends of bones where they meet to form a joint. Osteoarthritis is the most common form of arthritis. It often occurs in older people. The joints affected most often by this condition include those in the:  Ends of the fingers.  Thumbs.  Neck.  Lower back.  Knees.  Hips. CAUSES  Over time, the cartilage that covers the ends of bones begins to wear away. This causes bone to rub on bone, producing pain and stiffness in the affected joints.  RISK FACTORS Certain factors can increase your chances of having osteoarthritis, including:  Older age.  Excessive body weight.  Overuse of joints.  Previous joint injury. SIGNS AND SYMPTOMS   Pain, swelling, and stiffness in the joint.  Over time, the joint may lose its normal shape.  Small deposits of bone (osteophytes) may grow on the edges of the joint.  Bits of bone or cartilage can break off and float inside the joint space. This may cause more pain and damage. DIAGNOSIS  Your health care provider will do a physical  exam and ask about your symptoms. Various tests may be ordered, such as:  X-rays of the affected joint.  Blood tests to rule out other types of arthritis. Additional tests may be used to diagnose your condition. TREATMENT  Goals of treatment are to control pain and improve joint function. Treatment plans may include:  A prescribed exercise program that allows for rest and joint relief.  A weight control plan.  Pain relief techniques, such as:  Properly applied heat and cold.  Electric pulses delivered to nerve endings under the skin (transcutaneous electrical nerve stimulation [TENS]).  Massage.  Certain nutritional supplements.  Medicines to control pain, such as:  Acetaminophen.  Nonsteroidal anti-inflammatory drugs (NSAIDs), such as naproxen.  Narcotic or central-acting agents, such as tramadol.  Corticosteroids. These can be given orally or as an injection.  Surgery to reposition the bones and relieve pain (osteotomy) or to remove loose pieces of bone and cartilage. Joint replacement may be needed in advanced states of osteoarthritis. HOME CARE INSTRUCTIONS   Take medicines only as directed by your health care provider.  Maintain a healthy weight. Follow your health care provider's instructions for weight control. This may include dietary instructions.  Exercise as directed. Your health care provider can recommend specific types of exercise. These may include:  Strengthening exercises. These are done to strengthen the muscles that support joints affected by arthritis. They can be performed with weights or with exercise bands to add resistance.  Aerobic activities. These  are exercises, such as brisk walking or low-impact aerobics, that get your heart pumping.  Range-of-motion activities. These keep your joints limber.  Balance and agility exercises. These help you maintain daily living skills.  Rest your affected joints as directed by your health care  provider.  Keep all follow-up visits as directed by your health care provider. SEEK MEDICAL CARE IF:   Your skin turns red.  You develop a rash in addition to your joint pain.  You have worsening joint pain.  You have a fever along with joint or muscle aches. SEEK IMMEDIATE MEDICAL CARE IF:  You have a significant loss of weight or appetite.  You have night sweats. [place] of Arthritis and Musculoskeletal and Skin Diseases: www.niams.SouthExposed.es  [employer] on Aging: http://kim-miller.com/  American College of Rheumatology: www.rheumatology.org   This information is not intended to replace advice given to you by your health care provider. Make sure you discuss any questions you have with your health care provider.   Document Released: 06/25/2005 Document Revised: 07/16/2014 Document Reviewed: 03/02/2013 Elsevier Interactive Patient Education [employer].

## 2015-06-30 ENCOUNTER — Telehealth: Payer: Self-pay | Admitting: Family Medicine

## 2015-06-30 LAB — BASIC METABOLIC PANEL
BUN: 15 mg/dL (ref 7–25)
CHLORIDE: 102 mmol/L (ref 98–110)
CO2: 22 mmol/L (ref 20–31)
Calcium: 8.5 mg/dL — ABNORMAL LOW (ref 8.6–10.3)
Creat: 1.56 mg/dL — ABNORMAL HIGH (ref 0.70–1.33)
GLUCOSE: 150 mg/dL — AB (ref 65–99)
POTASSIUM: 4.1 mmol/L (ref 3.5–5.3)
Sodium: 134 mmol/L — ABNORMAL LOW (ref 135–146)

## 2015-06-30 NOTE — Telephone Encounter (Signed)
Patient just had his appointment yesterday and results just came back in. Informed him that his kidney function was normal. Patient not taking tramadol as prescribed. Discussed that he should take this as prescribed with Tylenol as needed. Additionally, stressed that the FDA states that the absolute daily max of tramadol that anyone should be taking is 400mg /day. Patient thanked me for returning his call.  Archie Patten, MD [facility] Family Medicine Resident  06/30/2015, 1:37 PM

## 2015-06-30 NOTE — Telephone Encounter (Signed)
Pt called and would like to speak to Dr. Lorenso Courier when she has a moment. He would like to know what his test results were and He is currently taking 200 MG of Tramadol every 8 hours and this helps a lot. He still has some pain but he can function and do things. jw

## 2015-07-26 ENCOUNTER — Other Ambulatory Visit: Payer: Self-pay | Admitting: Family Medicine

## 2015-08-18 ENCOUNTER — Other Ambulatory Visit: Payer: Self-pay | Admitting: Internal Medicine

## 2015-08-18 ENCOUNTER — Other Ambulatory Visit: Payer: Self-pay | Admitting: Family Medicine

## 2015-09-16 ENCOUNTER — Ambulatory Visit: Payer: Commercial Managed Care - HMO | Admitting: Family Medicine

## 2015-09-26 ENCOUNTER — Ambulatory Visit (INDEPENDENT_AMBULATORY_CARE_PROVIDER_SITE_OTHER): Payer: Commercial Managed Care - HMO | Admitting: Family Medicine

## 2015-09-26 ENCOUNTER — Encounter: Payer: Self-pay | Admitting: Family Medicine

## 2015-09-26 VITALS — BP 127/66 | HR 67 | Temp 98.4°F | Ht 73.0 in | Wt 220.7 lb

## 2015-09-26 DIAGNOSIS — I10 Essential (primary) hypertension: Secondary | ICD-10-CM

## 2015-09-26 DIAGNOSIS — E1122 Type 2 diabetes mellitus with diabetic chronic kidney disease: Secondary | ICD-10-CM | POA: Diagnosis not present

## 2015-09-26 DIAGNOSIS — E119 Type 2 diabetes mellitus without complications: Secondary | ICD-10-CM

## 2015-09-26 DIAGNOSIS — R6889 Other general symptoms and signs: Secondary | ICD-10-CM | POA: Diagnosis not present

## 2015-09-26 LAB — COMPREHENSIVE METABOLIC PANEL
ALK PHOS: 48 U/L (ref 40–115)
ALT: 15 U/L (ref 9–46)
AST: 20 U/L (ref 10–35)
Albumin: 3.8 g/dL (ref 3.6–5.1)
BUN: 14 mg/dL (ref 7–25)
CO2: 23 mmol/L (ref 20–31)
CREATININE: 1.65 mg/dL — AB (ref 0.70–1.33)
Calcium: 9 mg/dL (ref 8.6–10.3)
Chloride: 101 mmol/L (ref 98–110)
Glucose, Bld: 138 mg/dL — ABNORMAL HIGH (ref 65–99)
Potassium: 4.1 mmol/L (ref 3.5–5.3)
SODIUM: 133 mmol/L — AB (ref 135–146)
TOTAL PROTEIN: 7.3 g/dL (ref 6.1–8.1)
Total Bilirubin: 0.4 mg/dL (ref 0.2–1.2)

## 2015-09-26 LAB — POCT GLYCOSYLATED HEMOGLOBIN (HGB A1C): Hemoglobin A1C: 7.2

## 2015-09-26 MED ORDER — PIOGLITAZONE HCL 15 MG PO TABS: 30.0000 mg | ORAL_TABLET | Freq: Every day | ORAL | Status: DC

## 2015-09-26 NOTE — Patient Instructions (Signed)
It was nice to see you again.  Increase your Actos to 30mg  (2 tablets) daily. If you note that you're having episodes of low blood sugar, go back down to 1 tablet daily and let us know. We got lab work today- [facility] you a letter if it is normal/stable, otherwise my office will call you.

## 2015-09-26 NOTE — Progress Notes (Signed)
Patient ID: Andrew [name], male   DOB: July 20, 1955, 60 y.o.   MRN: [id number]    Subjective: CC: HTN and DM HPI: Patient is a 60 y.o. male with a past medical history of T2DM, CKD, and HTN presenting to clinic today for f/u HTN and DM.  Hip pain: stable with Tylenol   Hypertension Blood pressure at home: doesn't check.  Blood pressure today: 118/60 Meds: Compliant with aldactone and enalapril, chlorthalidone was d/c in the past due to hyponatremia.  Side effects: none  ROS: Denies headache, dizziness, visual changes, nausea, vomiting, chest pain, abdominal pain or shortness of breath.  Diabetes:  CBGs: 102-230s per checks (checks 2x/day) Taking medications: compliant with Actos 45m daily, sometimes takes it twice daily if his CBGs is >120. Stopped metformin due to elevated SCr in the past.  Side effects: none  ROS: denies fever, chills, dizziness, LOC, polyuria, polydipsia, numbness or tingling in extremities or chest pain. No hypoglycemic feelings.  Last eye exam: 01/03/15 Last foot exam: 03/24/15 Last A1c: 6.4 on 12/16 Last pneumovax: 04/14/14   Social History: never smoker   ROS: All other systems reviewed and are negative except that noted in HPI.   Past Medical History Patient Active Problem List   Diagnosis Date Noted  . Constipation 05/10/2015  . Dizziness 04/20/2015  . Nevus of toe 04/20/2015  . Foot lesion 03/24/2015  . Bilateral foot pain 10/26/2014  . Impairment of balance 09/02/2014  . History of stroke 09/02/2014  . Tooth pain 01/28/2014  . De Quervain's tenosynovitis, right 11/05/2013  . Pinguecula of both eyes 09/23/2013  . Chronic hyponatremia 09/23/2013  . Elevated serum creatinine 05/25/2013  . Hypothyroidism 05/25/2013  . Memory loss 05/21/2013  . Rash and nonspecific skin eruption 05/21/2013  . Personal history of colonic adenomas 04/01/2013  . Unspecified constipation 01/13/2013  . Joint pain 05/06/2011  . OBESITY, UNSPECIFIED 05/20/2009    . Knee pain, bilateral 04/28/2009  . LUMBAGO 03/25/2009  . PSEUDOFOLLICULITIS BARBAE 064/68/0321 . SARCOIDOSIS 02/20/2007  . Hyperlipidemia associated with type 2 diabetes mellitus ([place] 02/20/2007  . DM (diabetes mellitus), type 2 with renal complications ([facility] 022/48/2500 . ERECTILE DYSFUNCTION 10/18/2006  . 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 Essential hypertension 10/18/2006  . CKD (chronic kidney disease) stage 3, GFR 30-59 ml/min 09/05/2006    Medications- reviewed and updated Current Outpatient Prescriptions  Medication Sig Dispense Refill  . allopurinol (ZYLOPRIM) 100 MG tablet Take 1 tablet (100 mg total) by mouth daily. 90 tablet 3  . amLODipine (NORVASC) 10 MG tablet TAKE 1 TABLET ONE TIME DAILY 90 tablet 1  . Blood Glucose Monitoring Suppl (BLOOD GLUCOSE METER) kit Use to test fasting glucose daily.  Diagnosis Type II diabetes 250.0 1 each 0  . glucose blood (TRUETEST TEST) test strip Use to test blood sugar twice daily for type II diabetes (E11.29) 200 each 3  . levothyroxine (SYNTHROID, LEVOTHROID) 75 MCG tablet Take 1 tablet (75 mcg total) by mouth daily. 90 tablet 1  . lovastatin (MEVACOR) 40 MG tablet TAKE 1 TABLET AT BEDTIME 90 tablet 1  . pioglitazone (ACTOS) 15 MG tablet Take 2 tablets (30 mg total) by mouth daily. 180 tablet 2  . PRODIGY LANCETS 26G MISC Use to check fasting blood glucose each morning.  Diagnosis: Diabetes Mellitus Type II 250.00 100 each 12  . spironolactone (ALDACTONE) 25 MG tablet Take 0.5 tablets (12.5 mg total) by mouth daily. 90 tablet 1  . tadalafil (CIALIS) 10 MG tablet  Take 1 tablet (10 mg total) by mouth daily as needed for erectile dysfunction. 5 tablet 3  . traZODone (DESYREL) 100 MG tablet Take 1 tablet (100 mg total) by mouth at bedtime. 90 tablet 1  . aspirin 81 MG chewable tablet Chew 81 mg by mouth daily.    . enalapril (VASOTEC) 20 MG tablet Take 2 tablets (40 mg total) by mouth daily. 180 tablet 1   No  current facility-administered medications for this visit.    Objective: Office vital signs reviewed. BP 127/66 mmHg  Pulse 67  Temp(Src) 98.4 F (36.9 C) (Oral)  Ht _0  (1.854 m)  Wt 220 lb 11.2 oz (100.109 kg)  BMI 29.12 kg/m2   Physical Examination:  General: Awake, alert, well- nourished, NAD Cardio: RRR, no m/r/g noted. No pitting edema.  Pulm: No increased WOB.  CTAB, without wheezes, rhonchi or crackles noted.  Skin: dry, intact, no rashes or lesions  A1c 7.2  Assessment/Plan: Essential hypertension At goal. Will continue aldactone and enalapril. - BMET today to f/u SCr.   DM (diabetes mellitus), type 2 with renal complications Patient endorses increased CBGs which concerns him given his CKD. No hypoglycemic episodes, even when he increases Actos to BID. A1c elevated from 6.4 to 7.2.  - will increase Actos to 74m daily - patient to f/u in 3 months or sooner as needed.     Orders Placed This Encounter  Procedures  . Comprehensive metabolic panel    Order Specific Question:  Has the patient fasted?    Answer:  No  . HgB A1c    Meds ordered this encounter  Medications  . pioglitazone (ACTOS) 15 MG tablet    Sig: Take 2 tablets (30 mg total) by mouth daily.    Dispense:  180 tablet    [place]  [email or web address]

## 2015-09-27 NOTE — Assessment & Plan Note (Signed)
At goal. Will continue aldactone and enalapril. - BMET today to f/u SCr.

## 2015-09-27 NOTE — Assessment & Plan Note (Signed)
Patient endorses increased CBGs which concerns him given his CKD. No hypoglycemic episodes, even when he increases Actos to BID. A1c elevated from 6.4 to 7.2.  - will increase Actos to 30mg  daily - patient to f/u in 3 months or sooner as needed.

## 2015-10-10 ENCOUNTER — Encounter: Payer: Self-pay | Admitting: Family Medicine

## 2015-10-11 ENCOUNTER — Telehealth: Payer: Self-pay | Admitting: Family Medicine

## 2015-10-11 MED ORDER — PIOGLITAZONE HCL 30 MG PO TABS: 30.0000 mg | ORAL_TABLET | Freq: Every day | ORAL | Status: DC

## 2015-10-11 NOTE — Telephone Encounter (Signed)
Pt called because his online pharmacy is having some issues with getting his Pioglitazone to him. They are asking that the doctor send a prescription to [facility] on [address] for his Pioglitazone. Please call patient when this in done. jw

## 2015-10-11 NOTE — Telephone Encounter (Signed)
Rx for Actos 30mg  daily for a 30 day supply sent to [facility]. Please let pt know it's not a 90 day supply as his insurance would not cover it.  Thanks, Archie Patten, MD [facility] Medicine Resident  10/11/2015, 9:37 AM

## 2015-10-12 NOTE — Telephone Encounter (Signed)
Left message on voicemail informing of below.

## 2015-10-18 ENCOUNTER — Telehealth: Payer: Self-pay | Admitting: Family Medicine

## 2015-10-18 NOTE — Telephone Encounter (Signed)
Pt called because he has a eye doctor appointment with Dr. Richardean Sale on May 15 at 1:35. He needs a referral sent there so that he can be seen. jw

## 2015-10-19 NOTE — Telephone Encounter (Signed)
Referral entered into [employer]. Completed.

## 2015-11-21 DIAGNOSIS — E119 Type 2 diabetes mellitus without complications: Secondary | ICD-10-CM | POA: Diagnosis not present

## 2015-11-21 DIAGNOSIS — H2513 Age-related nuclear cataract, bilateral: Secondary | ICD-10-CM | POA: Diagnosis not present

## 2015-11-21 DIAGNOSIS — Z0100 Encounter for examination of eyes and vision without abnormal findings: Secondary | ICD-10-CM | POA: Diagnosis not present

## 2015-11-21 LAB — HM DIABETES EYE EXAM

## 2015-11-24 ENCOUNTER — Emergency Department (HOSPITAL_COMMUNITY)
Admission: EM | Admit: 2015-11-24 | Discharge: 2015-11-24 | Disposition: A | Discharge status: Home / Self Care | Payer: Commercial Managed Care - HMO | Attending: Emergency Medicine | Admitting: Emergency Medicine

## 2015-11-24 ENCOUNTER — Encounter (HOSPITAL_COMMUNITY): Payer: Self-pay

## 2015-11-24 DIAGNOSIS — M1990 Unspecified osteoarthritis, unspecified site: Secondary | ICD-10-CM | POA: Insufficient documentation

## 2015-11-24 DIAGNOSIS — Z880 Allergy status to penicillin: Secondary | ICD-10-CM | POA: Diagnosis not present

## 2015-11-24 DIAGNOSIS — Z7982 Long term (current) use of aspirin: Secondary | ICD-10-CM | POA: Diagnosis not present

## 2015-11-24 DIAGNOSIS — Z872 Personal history of diseases of the skin and subcutaneous tissue: Secondary | ICD-10-CM | POA: Insufficient documentation

## 2015-11-24 DIAGNOSIS — E785 Hyperlipidemia, unspecified: Secondary | ICD-10-CM | POA: Diagnosis not present

## 2015-11-24 DIAGNOSIS — R42 Dizziness and giddiness: Secondary | ICD-10-CM | POA: Diagnosis not present

## 2015-11-24 DIAGNOSIS — Z862 Personal history of diseases of the blood and blood-forming organs and certain disorders involving the immune mechanism: Secondary | ICD-10-CM | POA: Diagnosis not present

## 2015-11-24 DIAGNOSIS — Z792 Long term (current) use of antibiotics: Secondary | ICD-10-CM | POA: Insufficient documentation

## 2015-11-24 DIAGNOSIS — Z79899 Other long term (current) drug therapy: Secondary | ICD-10-CM | POA: Diagnosis not present

## 2015-11-24 DIAGNOSIS — Z86010 Personal history of colonic polyps: Secondary | ICD-10-CM | POA: Diagnosis not present

## 2015-11-24 DIAGNOSIS — Q742 Other congenital malformations of lower limb(s), including pelvic girdle: Secondary | ICD-10-CM | POA: Diagnosis not present

## 2015-11-24 DIAGNOSIS — Z8673 Personal history of transient ischemic attack (TIA), and cerebral infarction without residual deficits: Secondary | ICD-10-CM | POA: Insufficient documentation

## 2015-11-24 DIAGNOSIS — N182 Chronic kidney disease, stage 2 (mild): Secondary | ICD-10-CM | POA: Diagnosis not present

## 2015-11-24 DIAGNOSIS — E119 Type 2 diabetes mellitus without complications: Secondary | ICD-10-CM | POA: Insufficient documentation

## 2015-11-24 DIAGNOSIS — I129 Hypertensive chronic kidney disease with stage 1 through stage 4 chronic kidney disease, or unspecified chronic kidney disease: Secondary | ICD-10-CM | POA: Insufficient documentation

## 2015-11-24 LAB — CBC WITH DIFFERENTIAL/PLATELET
Basophils Absolute: 0 10*3/uL (ref 0.0–0.1)
Basophils Relative: 1 %
Eosinophils Absolute: 0.3 10*3/uL (ref 0.0–0.7)
Eosinophils Relative: 6 %
HCT: 41.2 % (ref 39.0–52.0)
HEMOGLOBIN: 13.6 g/dL (ref 13.0–17.0)
Lymphocytes Relative: 35 %
Lymphs Abs: 1.6 10*3/uL (ref 0.7–4.0)
MCH: 31.6 pg (ref 26.0–34.0)
MCHC: 33 g/dL (ref 30.0–36.0)
MCV: 95.8 fL (ref 78.0–100.0)
Monocytes Absolute: 0.6 10*3/uL (ref 0.1–1.0)
Monocytes Relative: 12 %
NEUTROS ABS: 2.2 10*3/uL (ref 1.7–7.7)
NEUTROS PCT: 46 %
Platelets: 188 10*3/uL (ref 150–400)
RBC: 4.3 MIL/uL (ref 4.22–5.81)
RDW: 13.4 % (ref 11.5–15.5)
WBC: 4.6 10*3/uL (ref 4.0–10.5)

## 2015-11-24 LAB — URINALYSIS, ROUTINE W REFLEX MICROSCOPIC
Bilirubin Urine: NEGATIVE
GLUCOSE, UA: 100 mg/dL — AB
HGB URINE DIPSTICK: NEGATIVE
Ketones, ur: NEGATIVE mg/dL
Leukocytes, UA: NEGATIVE
Nitrite: NEGATIVE
Protein, ur: NEGATIVE mg/dL
SPECIFIC GRAVITY, URINE: 1.009 (ref 1.005–1.030)
pH: 5 (ref 5.0–8.0)

## 2015-11-24 LAB — COMPREHENSIVE METABOLIC PANEL
ALBUMIN: 3.6 g/dL (ref 3.5–5.0)
ALK PHOS: 44 U/L (ref 38–126)
ALT: 16 U/L — AB (ref 17–63)
AST: 21 U/L (ref 15–41)
Anion gap: 9 (ref 5–15)
BUN: 15 mg/dL (ref 6–20)
CALCIUM: 9 mg/dL (ref 8.9–10.3)
CO2: 24 mmol/L (ref 22–32)
CREATININE: 1.78 mg/dL — AB (ref 0.61–1.24)
Chloride: 97 mmol/L — ABNORMAL LOW (ref 101–111)
GFR calc non Af Amer: 40 mL/min — ABNORMAL LOW (ref >60)
GFR, EST AFRICAN AMERICAN: 46 mL/min — AB (ref >60)
GLUCOSE: 239 mg/dL — AB (ref 65–99)
Potassium: 4.4 mmol/L (ref 3.5–5.1)
Sodium: 130 mmol/L — ABNORMAL LOW (ref 135–145)
Total Bilirubin: 0.5 mg/dL (ref 0.3–1.2)
Total Protein: 7.1 g/dL (ref 6.5–8.1)

## 2015-11-24 NOTE — ED Notes (Signed)
Patient here requesting BP check because he feels as if it is high. Also reports bloodsugar up this am. Describes right arm stiffness that started yesterday and light headedness this am. States has right sided deficits from previous strokes. Blood sugar 174 this am. Alert and oriented, no deficits noted

## 2015-11-24 NOTE — ED Notes (Signed)
Pt is in stable condition upon d/c and ambulates from ED. 

## 2015-11-24 NOTE — Discharge Instructions (Signed)
Your neurological exam today was in tact. Your blood work does show a little elevated blood sugar. Document your blood sugars at home to discuss need to change diabetes medication with your PCP.   Return without fail for any worsening symptoms, including new weakness or numbness, vision or speech changes, difficulty walking, chest pain or difficulty breathing, passing out or any other symptoms concerning to you   Dizziness Dizziness is a common problem. It is a feeling of unsteadiness or light-headedness. You may feel like you are about to faint. Dizziness can lead to injury if you stumble or fall. Anyone can become dizzy, but dizziness is more common in older adults. This condition can be caused by a number of things, including medicines, dehydration, or illness. HOME CARE INSTRUCTIONS Taking these steps may help with your condition: Eating and Drinking  Drink enough fluid to keep your urine clear or pale yellow. This helps to keep you from becoming dehydrated. Try to drink more clear fluids, such as water.  Do not drink alcohol.  Limit your caffeine intake if directed by your health care provider.  Limit your salt intake if directed by your health care provider. Activity  Avoid making quick movements.  Rise slowly from chairs and steady yourself until you feel okay.  In the morning, first sit up on the side of the bed. When you feel okay, stand slowly while you hold onto something until you know that your balance is fine.  Move your legs often if you need to stand in one place for a long time. Tighten and relax your muscles in your legs while you are standing.  Do not drive or operate heavy machinery if you feel dizzy.  Avoid bending down if you feel dizzy. Place items in your home so that they are easy for you to reach without leaning over. Lifestyle  Do not use any tobacco products, including cigarettes, chewing tobacco, or electronic cigarettes. If you need help quitting, ask  your health care provider.  Try to reduce your stress level, such as with yoga or meditation. Talk with your health care provider if you need help. General Instructions  Watch your dizziness for any changes.  Take medicines only as directed by your health care provider. Talk with your health care provider if you think that your dizziness is caused by a medicine that you are taking.  Tell a friend or a family member that you are feeling dizzy. If he or she notices any changes in your behavior, have this person call your health care provider.  Keep all follow-up visits as directed by your health care provider. This is important. SEEK MEDICAL CARE IF:  Your dizziness does not go away.  Your dizziness or light-headedness gets worse.  You feel nauseous.  You have reduced hearing.  You have new symptoms.  You are unsteady on your feet or you feel like the room is spinning. SEEK IMMEDIATE MEDICAL CARE IF:  You vomit or have diarrhea and are unable to eat or drink anything.  You have problems talking, walking, swallowing, or using your arms, hands, or legs.  You feel generally weak.  You are not thinking clearly or you have trouble forming sentences. It may take a friend or family member to notice this.  You have chest pain, abdominal pain, shortness of breath, or sweating.  Your vision changes.  You notice any bleeding.  You have a headache.  You have neck pain or a stiff neck.  You have a  fever.   This information is not intended to replace advice given to you by your health care provider. Make sure you discuss any questions you have with your health care provider.   Document Released: 12/19/2000 Document Revised: 11/09/2014 Document Reviewed: 06/21/2014 Elsevier Interactive Patient Education [employer].

## 2015-11-24 NOTE — ED Provider Notes (Signed)
CSN: [id number]     Arrival date & time 11/24/15  8299 History   First MD Initiated Contact with Patient 11/24/15 [phone]     Chief Complaint  Patient presents with  . Dizziness     (Consider location/radiation/quality/duration/timing/severity/associated sxs/prior Treatment) HPI 60 year old male who presents with episode of lightheadedness and stiffness in the right hand. He has a history of CKD, hypertension, hyperlipidemia, diabetes, and CVA 4 with residual right-sided weakness. States that he otherwise has been in his usual state of health, but 2 days ago after eating a large amount of ribs stated that he felt funny. Reports that at the end of the day, he was having an aching pain in his right hand and forearm. Says this went away, and he had associated lightheadedness without syncope, chest pain or difficulty breathing. This morning after eating breakfast, he notes that his right hand felt a little stiff. He did not have any associating numbness, weakness, difficulty swallowing, vision or speech changes, difficulty walking, chest pain or difficulty breathing. Has not recently had fever, chest pain, difficulty breathing, cough, nausea or vomiting, diarrhea, diaphoresis, dysuria or urinary frequency. States he came today to make sure that everything was fine . Past Medical History  Diagnosis Date  . CRD (chronic renal disease), stage II     Baseline Cr 1.2  . Prostatitis     hx of x2  . HTN (hypertension)   . HLD (hyperlipidemia)   . T2DM (type 2 diabetes mellitus) ([place])   . CVA (cerebral vascular accident) ([place])     x4 last one in 2007, R sided residual weakness  . Sarcoidosis ([place])   . Arthritis   . Leg swelling   . HAMMER TOE 07/25/2010  . Lipoma of back 03/18/2011  . PSEUDOFOLLICULITIS BARBAE 03/14/7892    Qualifier: Diagnosis of  By: Danise Mina  MD, Garlon Hatchet    . Personal history of colonic adenomas 04/01/2013   Past Surgical History  Procedure Laterality Date  . Transthoracic  echocardiogram  2005    EF 60%  . Ercp  2006    Normal  . Hernia repair  1985, 1995    x2 'inguinal  . Lipoma excision  06/22/2011    Procedure: EXCISION LIPOMA;  Surgeon: Rolm Bookbinder, MD;  Location: [facility];  Service: General;  Laterality: N/A;   Family History  Problem Relation Age of Onset  . Bone cancer Father   . Cancer Father 58    bone  . Uterine cancer Mother   . Heart attack Brother   . Kidney disease Brother   . Heart disease Sister   . Colon cancer Neg Hx   . Rectal cancer Neg Hx   . Stomach cancer Neg Hx    Social History  Substance Use Topics  . Smoking status: Never Smoker   . Smokeless tobacco: Never Used  . Alcohol Use: Yes     Comment: beer on the weekend    Review of Systems 10/14 systems reviewed and are negative other than those stated in the HPI    Allergies  Metoprolol succinate and Penicillins  Home Medications   Prior to Admission medications   Medication Sig Start Date End Date Taking? Authorizing Provider  allopurinol (ZYLOPRIM) 100 MG tablet Take 1 tablet (100 mg total) by mouth daily. 05/10/15  Yes Verner Mould, MD  amLODipine (NORVASC) 10 MG tablet TAKE 1 TABLET ONE TIME DAILY 05/10/15  Yes Verner Mould, MD  aspirin 81 MG chewable tablet  Chew 81 mg by mouth daily.   Yes Historical Provider, MD  enalapril (VASOTEC) 20 MG tablet Take 2 tablets (40 mg total) by mouth daily. 05/10/15  Yes Verner Mould, MD  levothyroxine (SYNTHROID, LEVOTHROID) 75 MCG tablet Take 1 tablet (75 mcg total) by mouth daily. 05/10/15  Yes Verner Mould, MD  lovastatin (MEVACOR) 40 MG tablet TAKE 1 TABLET AT BEDTIME 08/18/15  Yes Archie Patten, MD  pioglitazone (ACTOS) 30 MG tablet Take 1 tablet (30 mg total) by mouth daily. 10/11/15  Yes Archie Patten, MD  spironolactone (ALDACTONE) 25 MG tablet Take 0.5 tablets (12.5 mg total) by mouth daily. 05/10/15  Yes Verner Mould, MD  traZODone  (DESYREL) 100 MG tablet Take 1 tablet (100 mg total) by mouth at bedtime. 05/10/15  Yes Verner Mould, MD  Blood Glucose Monitoring Suppl (BLOOD GLUCOSE METER) kit Use to test fasting glucose daily.  Diagnosis Type II diabetes 250.0 Patient not taking: Reported on 11/24/2015 04/14/12   Luetta Nutting, DO  glucose blood (TRUETEST TEST) test strip Use to test blood sugar twice daily for type II diabetes (E11.29) Patient not taking: Reported on 11/24/2015 05/10/15   Verner Mould, MD  PRODIGY LANCETS 26G MISC Use to check fasting blood glucose each morning.  Diagnosis: Diabetes Mellitus Type II 250.00 Patient not taking: Reported on 11/24/2015 03/30/12   Luetta Nutting, DO  tadalafil (CIALIS) 10 MG tablet Take 1 tablet (10 mg total) by mouth daily as needed for erectile dysfunction. Patient not taking: Reported on 11/24/2015 05/21/12   Luetta Nutting, DO   BP 142/72 mmHg  Pulse 74  Temp(Src) 98.5 F (36.9 C) (Oral)  Resp 20  SpO2 100% Physical Exam Physical Exam  Nursing note and vitals reviewed. Constitutional: Well developed, well nourished, non-toxic, and in no acute distress Head: Normocephalic and atraumatic.  Mouth/Throat: Oropharynx is clear and moist.  Neck: Normal range of motion. Neck supple.  Cardiovascular: Normal rate and regular rhythm.   Pulmonary/Chest: Effort normal and breath sounds normal.  Abdominal: Soft. There is no tenderness. There is no rebound and no guarding.  Musculoskeletal: Normal range of motion.  Neurological: Alert, no facial droop, fluent speech, moves all extremities symmetrically Skin: Skin is warm and dry.  Psychiatric: Cooperative Neurological:  Alert, oriented to person, place, time, and situation. Memory grossly in tact. Fluent speech. No dysarthria or aphasia.  Cranial nerves: VF are full. Pupils are symmetric, and reactive to light. EOMI without nystagmus. No gaze deviation. Facial muscles symmetric with activation. Sensation to light  touch over face in tact bilaterally. Hearing grossly in tact. Palate elevates symmetrically. Head turn and shoulder shrug are intact. Tongue midline.  Reflexes defered.  Muscle bulk and tone normal. No pronator drift. Moves all extremities symmetrically. Sensation to light touch is in tact throughout in bilateral upper and lower extremities. Coordination reveals no dysmetria with finger to nose. Gait is narrow-based and steady. Non-ataxic.  ED Course  Procedures (including critical care time) Labs Review Labs Reviewed  COMPREHENSIVE METABOLIC PANEL - Abnormal; Notable for the following:    Sodium 130 (*)    Chloride 97 (*)    Glucose, Bld 239 (*)    Creatinine, Ser 1.78 (*)    ALT 16 (*)    GFR calc non Af Amer 40 (*)    GFR calc Af Amer 46 (*)    All other components within normal limits  URINALYSIS, ROUTINE W REFLEX MICROSCOPIC (NOT AT [facility]) - Abnormal; Notable for  the following:    Glucose, UA 100 (*)    All other components within normal limits  CBC WITH DIFFERENTIAL/PLATELET    Imaging Review No results found. I have personally reviewed and evaluated these images and lab results as part of my medical decision-making.   EKG Interpretation   Date/Time:  Thursday Nov 24 2015 09:47:32 EDT Ventricular Rate:  58 PR Interval:  180 QRS Duration: 91 QT Interval:  418 QTC Calculation: 410 R Axis:   9 Text Interpretation:  Sinus rhythm Borderline low voltage, extremity leads  No acute ischemic changes. Unchagned from prior EKG  Confirmed by Malgorzata Albert MD,  Hardie Veltre [phone]) on 11/24/2015 11:27:26 AM      MDM   Final diagnoses:  Dizziness    60 year old male who presents with episode of stiffness in his right hand. On presentation, he is well-appearing and in no acute distress. His vital signs are overall non-concerning. He has a normal neurological exam, without appreciable weakness or numbness especially in the right hand or right upper extremity. Cardiopulmonary exam is  unremarkable. Blood work here shows stable CK D. He has hyperglycemia of 200s without evidence of DKA. EKG without acute ischemic changes or stigmata of arrhythmia. Presentation is not concerning for that of an acute stroke or TIA. No appreciable neurological deficits or neurological complaints. Stiffness   in his hand is no longer appreciable,  but would not expect that for stroke at this time. No chest pain arm pain or symptoms that would be equivalent of that for ACS. At this time, he is felt stable for discharge home. Did review strict return and follow-up instructions. He expressed understanding of all discharge instructions, and felt comfortable with the plan of care.  Forde Dandy, MD 11/24/15 1140

## 2015-11-29 ENCOUNTER — Other Ambulatory Visit: Payer: Self-pay | Admitting: Family Medicine

## 2015-12-12 ENCOUNTER — Other Ambulatory Visit: Payer: Self-pay | Admitting: Internal Medicine

## 2015-12-12 ENCOUNTER — Telehealth: Payer: Self-pay | Admitting: *Deleted

## 2015-12-12 ENCOUNTER — Other Ambulatory Visit: Payer: Self-pay | Admitting: Family Medicine

## 2015-12-12 NOTE — ED Notes (Signed)
Rx sent in for Synthroid.  Thanks, Archie Patten, MD [facility] Resident  12/12/2015, 10:39 AM   Archie Patten, MD 12/12/15 [phone]

## 2015-12-12 NOTE — Telephone Encounter (Signed)
Patient states he is completely out of levothyroxine 66mcg. Needs new rx sent to [place].

## 2015-12-19 ENCOUNTER — Ambulatory Visit (INDEPENDENT_AMBULATORY_CARE_PROVIDER_SITE_OTHER): Payer: Commercial Managed Care - HMO | Admitting: Family Medicine

## 2015-12-19 VITALS — BP 117/59 | HR 99 | Temp 98.4°F | Ht 73.0 in | Wt 218.8 lb

## 2015-12-19 DIAGNOSIS — Z114 Encounter for screening for human immunodeficiency virus [HIV]: Secondary | ICD-10-CM | POA: Diagnosis not present

## 2015-12-19 DIAGNOSIS — M25562 Pain in left knee: Secondary | ICD-10-CM

## 2015-12-19 DIAGNOSIS — M25561 Pain in right knee: Secondary | ICD-10-CM

## 2015-12-19 DIAGNOSIS — E1122 Type 2 diabetes mellitus with diabetic chronic kidney disease: Secondary | ICD-10-CM

## 2015-12-19 DIAGNOSIS — E039 Hypothyroidism, unspecified: Secondary | ICD-10-CM

## 2015-12-19 DIAGNOSIS — Z1159 Encounter for screening for other viral diseases: Secondary | ICD-10-CM | POA: Diagnosis not present

## 2015-12-19 DIAGNOSIS — R21 Rash and other nonspecific skin eruption: Secondary | ICD-10-CM

## 2015-12-19 LAB — TSH: TSH: 1.25 mIU/L (ref 0.40–4.50)

## 2015-12-19 LAB — POCT GLYCOSYLATED HEMOGLOBIN (HGB A1C): Hemoglobin A1C: 6.7

## 2015-12-19 MED ORDER — AMLODIPINE BESYLATE 10 MG PO TABS: ORAL_TABLET | ORAL | Status: DC

## 2015-12-19 MED ORDER — TRAZODONE HCL 100 MG PO TABS: 100.0000 mg | ORAL_TABLET | Freq: Every day | ORAL | Status: DC

## 2015-12-19 MED ORDER — TRIAMCINOLONE ACETONIDE 0.1 % EX CREA: 1.0000 "application " | TOPICAL_CREAM | Freq: Two times a day (BID) | CUTANEOUS | Status: DC

## 2015-12-19 MED ORDER — ASPIRIN 81 MG PO CHEW: 81.0000 mg | CHEWABLE_TABLET | Freq: Every day | ORAL | Status: DC

## 2015-12-19 MED ORDER — ALLOPURINOL 100 MG PO TABS: 100.0000 mg | ORAL_TABLET | Freq: Every day | ORAL | Status: DC

## 2015-12-19 MED ORDER — ENALAPRIL MALEATE 20 MG PO TABS
40.0000 mg | ORAL_TABLET | Freq: Every day | ORAL | Status: DC
Start: 2015-12-19 — End: 2016-05-21

## 2015-12-19 MED ORDER — LEVOTHYROXINE SODIUM 75 MCG PO TABS: 75.0000 ug | ORAL_TABLET | Freq: Every day | ORAL | Status: DC

## 2015-12-19 MED ORDER — LOVASTATIN 40 MG PO TABS: 40.0000 mg | ORAL_TABLET | Freq: Every day | ORAL | Status: DC

## 2015-12-19 MED ORDER — PIOGLITAZONE HCL 30 MG PO TABS: 30.0000 mg | ORAL_TABLET | Freq: Every day | ORAL | Status: DC

## 2015-12-19 MED ORDER — SPIRONOLACTONE 25 MG PO TABS: 12.5000 mg | ORAL_TABLET | Freq: Every day | ORAL | Status: DC

## 2015-12-19 NOTE — Progress Notes (Signed)
Patient ID: Andrew [name], male   DOB: May 30, 1956, 60 y.o.   MRN: [id number]    Subjective: CC: HTN and DM HPI: Patient is a 60 y.o. male with a past medical history of T2DM, CKD, and HTN presenting to clinic today for f/u HTN and DM.   Rash: just on left side around rib cage. Notes it starts out erythematous and slightly palpable. The rash is very pruritic. It always occurs around this time of year. He denies any new laundry detergent, lotions, body washes. Notes the area just a pretty moist. He was prescribed triamcinolone cream in the past which has done wonders.   Diabetes:  CBGs: 120-150 (checks 2x/day) Taking medications: compliant with Actos 71m daily Side effects: none  ROS: denies fever, chills, dizziness, LOC, polyuria, polydipsia, numbness or tingling in extremities or chest pain. No hypoglycemic feelings.  Last eye exam: 01/03/15 Last foot exam: 03/24/15 Last A1c: 7.2 on 09/2015 Last pneumovax: 04/14/14    Would like handicap placard: his knees and back pain is significantly worsened. Uses cane most of the time when out in public. Left knee was bothersome, now right is just as severe. He notes he was able to walk from the waiting room to the exam room without issue, but any further and he would have difficulties with pain in his knees  Social History: never smoker   ROS: All other systems reviewed and are negative except that noted in HPI.   Past Medical History Patient Active Problem List   Diagnosis Date Noted  . Constipation 05/10/2015  . Dizziness 04/20/2015  . Nevus of toe 04/20/2015  . Foot lesion 03/24/2015  . Bilateral foot pain 10/26/2014  . Impairment of balance 09/02/2014  . History of stroke 09/02/2014  . Tooth pain 01/28/2014  . De Quervain's tenosynovitis, right 11/05/2013  . Pinguecula of both eyes 09/23/2013  . Chronic hyponatremia 09/23/2013  . Elevated serum creatinine 05/25/2013  . Hypothyroidism 05/25/2013  . Memory loss 05/21/2013  .  Rash and nonspecific skin eruption 05/21/2013  . Personal history of colonic adenomas 04/01/2013  . Unspecified constipation 01/13/2013  . Joint pain 05/06/2011  . OBESITY, UNSPECIFIED 05/20/2009  . Knee pain, bilateral 04/28/2009  . LUMBAGO 03/25/2009  . PSEUDOFOLLICULITIS BARBAE 076/19/5093 . SARCOIDOSIS 02/20/2007  . Hyperlipidemia associated with type 2 diabetes mellitus ([place] 02/20/2007  . DM (diabetes mellitus), type 2 with renal complications ([place] 026/71/2458 . ERECTILE DYSFUNCTION 10/18/2006  . 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 Essential hypertension 10/18/2006  . CKD (chronic kidney disease) stage 3, GFR 30-59 ml/min 09/05/2006    Medications- reviewed and updated Current Outpatient Prescriptions  Medication Sig Dispense Refill  . allopurinol (ZYLOPRIM) 100 MG tablet Take 1 tablet (100 mg total) by mouth daily. 90 tablet 3  . amLODipine (NORVASC) 10 MG tablet TAKE 1 TABLET ONE TIME DAILY 90 tablet 1  . aspirin 81 MG chewable tablet Chew 1 tablet (81 mg total) by mouth daily. 90 tablet 1  . Blood Glucose Monitoring Suppl (BLOOD GLUCOSE METER) kit Use to test fasting glucose daily.  Diagnosis Type II diabetes 250.0 (Patient not taking: Reported on 11/24/2015) 1 each 0  . enalapril (VASOTEC) 20 MG tablet Take 2 tablets (40 mg total) by mouth daily. 180 tablet 1  . glucose blood (TRUETEST TEST) test strip Use to test blood sugar twice daily for type II diabetes (E11.29) (Patient not taking: Reported on 11/24/2015) 200 each 3  . levothyroxine (SYNTHROID, LEVOTHROID) 75 MCG tablet Take  1 tablet (75 mcg total) by mouth daily. 90 tablet 1  . lovastatin (MEVACOR) 40 MG tablet Take 1 tablet (40 mg total) by mouth at bedtime. 90 tablet 1  . pioglitazone (ACTOS) 30 MG tablet Take 1 tablet (30 mg total) by mouth daily. 30 tablet 3  . PRODIGY LANCETS 26G MISC Use to check fasting blood glucose each morning.  Diagnosis: Diabetes Mellitus Type II 250.00 (Patient not  taking: Reported on 11/24/2015) 100 each 12  . spironolactone (ALDACTONE) 25 MG tablet Take 0.5 tablets (12.5 mg total) by mouth daily. 90 tablet 1  . tadalafil (CIALIS) 10 MG tablet Take 1 tablet (10 mg total) by mouth daily as needed for erectile dysfunction. (Patient not taking: Reported on 11/24/2015) 5 tablet 3  . traZODone (DESYREL) 100 MG tablet Take 1 tablet (100 mg total) by mouth at bedtime. 90 tablet 1  . triamcinolone cream (KENALOG) 0.1 % Apply 1 application topically 2 (two) times daily. 30 g 1   No current facility-administered medications for this visit.    Objective: Office vital signs reviewed. BP 117/59 mmHg  Pulse 99  Temp(Src) 98.4 F (36.9 C) (Oral)  Ht '6\' 1"'  (1.854 m)  Wt 218 lb 12.8 oz (99.247 kg)  BMI 28.87 kg/m2   Physical Examination:  General: Awake, alert, well- nourished, NAD Cardio: RRR, no m/r/g noted. No pitting edema.  Pulm: No increased WOB.  CTAB, without wheezes, rhonchi or crackles noted.  Skin: Small erythematous papular rash on his left side just inferior to his nipple. Not vesicular in nature and not in a dermatomal pattern. No excoriations or drainage noted. No evidence of infection.  A1c 6.7   Assessment/Plan: DM (diabetes mellitus), type 2 with renal complications D7A is improved this visit from 7.2-6.7. Patient denies any signs or symptoms of hypoglycemia. He is happy with the way his blood sugars are looking now. -Continue Actos 30 mg daily -Next A1c scheduled for September 2017. Will need a foot exam at that time.  Hypothyroidism Currently asymptomatic. On Synthroid 35 mcg. -We'll check TSH today  Rash and nonspecific skin eruption Mild, appears to be a left small patch over the anterior chest wall. No evidence of superimposed infection. Suspect an atopic origin possibly worsened by the heat. -will refill triamcinolone cream.  Knee pain, bilateral Patient with significant knee pain. Care is that this is a combination of pes planus  and arthritis in his knees given its worsened over the years. Pain is mostly in the left knee. No significant crepitus or swelling noted on exam. -Consider attempting a left knee steroid injection in the future -Avoiding NSAIDs given his history of CKD.    Orders Placed This Encounter  Procedures  . TSH  . Hepatitis C antibody  . HIV antibody (with reflex)  . POCT A1C    Meds ordered this encounter  Medications  . traZODone (DESYREL) 100 MG tablet    Sig: Take 1 tablet (100 mg total) by mouth at bedtime.    Dispense:  90 tablet    Refill:  1  . spironolactone (ALDACTONE) 25 MG tablet    Sig: Take 0.5 tablets (12.5 mg total) by mouth daily.    Dispense:  90 tablet    Refill:  1  . pioglitazone (ACTOS) 30 MG tablet    Sig: Take 1 tablet (30 mg total) by mouth daily.    Dispense:  30 tablet    Refill:  3  . lovastatin (MEVACOR) 40 MG tablet  Sig: Take 1 tablet (40 mg total) by mouth at bedtime.    Dispense:  90 tablet    Refill:  1  . levothyroxine (SYNTHROID, LEVOTHROID) 75 MCG tablet    Sig: Take 1 tablet (75 mcg total) by mouth daily.    Dispense:  90 tablet    Refill:  1  . enalapril (VASOTEC) 20 MG tablet    Sig: Take 2 tablets (40 mg total) by mouth daily.    Dispense:  180 tablet    Refill:  1  . aspirin 81 MG chewable tablet    Sig: Chew 1 tablet (81 mg total) by mouth daily.    Dispense:  90 tablet    Refill:  1  . amLODipine (NORVASC) 10 MG tablet    Sig: TAKE 1 TABLET ONE TIME DAILY    Dispense:  90 tablet    Refill:  1  . allopurinol (ZYLOPRIM) 100 MG tablet    Sig: Take 1 tablet (100 mg total) by mouth daily.    Dispense:  90 tablet    Refill:  3  . triamcinolone cream (KENALOG) 0.1 %    Sig: Apply 1 application topically 2 (two) times daily.    Dispense:  30 g    Refill:  [place] PGY-2, [place]

## 2015-12-19 NOTE — Assessment & Plan Note (Signed)
Currently asymptomatic. On Synthroid 35 mcg. -We'll check TSH today

## 2015-12-19 NOTE — Assessment & Plan Note (Signed)
Patient with significant knee pain. Care is that this is a combination of pes planus and arthritis in his knees given its worsened over the years. Pain is mostly in the left knee. No significant crepitus or swelling noted on exam. -Consider attempting a left knee steroid injection in the future -Avoiding NSAIDs given his history of CKD.

## 2015-12-19 NOTE — Assessment & Plan Note (Signed)
A1c is improved this visit from 7.2-6.7. Patient denies any signs or symptoms of hypoglycemia. He is happy with the way his blood sugars are looking now. -Continue Actos 30 mg daily -Next A1c scheduled for September 2017. Will need a foot exam at that time.

## 2015-12-19 NOTE — Patient Instructions (Signed)
I have prescribed triamcinolone cream for the rash, try to keep the area dry. We will check your thyroid function. I will call you if it is not normal, otherwise you'll get a letter from me in the mail. Follow up with me when able so that we can do a steroid injection of the left knee.

## 2015-12-19 NOTE — Assessment & Plan Note (Signed)
Mild, appears to be a left small patch over the anterior chest wall. No evidence of superimposed infection. Suspect an atopic origin possibly worsened by the heat. -will refill triamcinolone cream.

## 2015-12-20 LAB — HEPATITIS C ANTIBODY: HCV Ab: NEGATIVE

## 2015-12-20 LAB — HIV ANTIBODY (ROUTINE TESTING W REFLEX): HIV: NONREACTIVE

## 2015-12-22 ENCOUNTER — Encounter: Payer: Self-pay | Admitting: Family Medicine

## 2015-12-26 ENCOUNTER — Ambulatory Visit (INDEPENDENT_AMBULATORY_CARE_PROVIDER_SITE_OTHER): Payer: Commercial Managed Care - HMO | Admitting: Family Medicine

## 2015-12-26 ENCOUNTER — Encounter: Payer: Self-pay | Admitting: Family Medicine

## 2015-12-26 VITALS — BP 132/64 | HR 56 | Temp 98.4°F | Ht 73.0 in | Wt 218.8 lb

## 2015-12-26 DIAGNOSIS — M25561 Pain in right knee: Secondary | ICD-10-CM

## 2015-12-26 DIAGNOSIS — M170 Bilateral primary osteoarthritis of knee: Secondary | ICD-10-CM

## 2015-12-26 DIAGNOSIS — M25562 Pain in left knee: Secondary | ICD-10-CM

## 2015-12-26 MED ORDER — METHYLPREDNISOLONE ACETATE 40 MG/ML IJ SUSP
40.0000 mg | Freq: Once | INTRAMUSCULAR | Status: AC
  Administered 2015-12-26: 40 mg via INTRA_ARTICULAR

## 2015-12-26 NOTE — Assessment & Plan Note (Signed)
Likely underlying etiology for bilateral knee pain, today L>R without acute flare. 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 Left knee steroid injection (1st knee injection) today, see procedure note, tolerated well 2. RICE therapy, stay active, may try heating pad 3. Continue Tylenol arthritis, avoid nsaids 4. Future follow-up if improved, then consider R-knee steroid injection

## 2015-12-26 NOTE — Addendum Note (Signed)
Addended by: Junious Dresser on: 12/26/2015 03:06 PM   Modules accepted: Orders
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
## 2015-12-30 ENCOUNTER — Ambulatory Visit: Payer: Commercial Managed Care - HMO | Admitting: Family Medicine

## 2016-01-03 ENCOUNTER — Telehealth: Payer: Self-pay | Admitting: Family Medicine

## 2016-01-03 MED ORDER — TRAMADOL HCL 50 MG PO TABS: 50.0000 mg | ORAL_TABLET | Freq: Three times a day (TID) | ORAL | Status: DC | PRN

## 2016-01-03 NOTE — Telephone Encounter (Signed)
Please let the patient know that a [facility] [address]  Thanks, Archie Patten, MD [facility] Family Medicine Resident  01/03/2016, 4:05 PM

## 2016-01-03 NOTE — Telephone Encounter (Signed)
Was taking avator for the gout.  The gout is better but pt is still in pain. He would like some type of pain med. Please call pt and let him know if something can be called in.

## 2016-01-03 NOTE — Telephone Encounter (Signed)
Pt informed. [name], CMA  

## 2016-01-03 NOTE — Telephone Encounter (Signed)
[facility] [address]. Please call pt when this is called in

## 2016-02-24 ENCOUNTER — Ambulatory Visit: Payer: Commercial Managed Care - HMO | Admitting: Family Medicine

## 2016-02-24 ENCOUNTER — Telehealth: Payer: Self-pay | Admitting: Family Medicine

## 2016-02-24 MED ORDER — PIOGLITAZONE HCL 30 MG PO TABS: 30.0000 mg | ORAL_TABLET | Freq: Every day | ORAL | 3 refills | Status: DC

## 2016-02-24 NOTE — Telephone Encounter (Signed)
Actos sent to the pharmacy.  Thanks, Archie Patten, MD [facility] Family Medicine Resident  02/24/2016, 5:36 PM

## 2016-02-24 NOTE — Telephone Encounter (Signed)
Pt needs diabetic medicine untll Friday August 25. Please send RX to [place]. He rescheduled his appt from Aug 18 the 25th

## 2016-03-02 ENCOUNTER — Encounter: Payer: Self-pay | Admitting: Family Medicine

## 2016-03-02 ENCOUNTER — Ambulatory Visit (INDEPENDENT_AMBULATORY_CARE_PROVIDER_SITE_OTHER): Payer: Commercial Managed Care - HMO | Admitting: Family Medicine

## 2016-03-02 VITALS — BP 140/68 | HR 62 | Temp 98.7°F | Ht 73.0 in | Wt 221.2 lb

## 2016-03-02 DIAGNOSIS — G8929 Other chronic pain: Secondary | ICD-10-CM

## 2016-03-02 DIAGNOSIS — N184 Chronic kidney disease, stage 4 (severe): Secondary | ICD-10-CM | POA: Diagnosis not present

## 2016-03-02 DIAGNOSIS — M170 Bilateral primary osteoarthritis of knee: Secondary | ICD-10-CM | POA: Diagnosis not present

## 2016-03-02 DIAGNOSIS — M25562 Pain in left knee: Secondary | ICD-10-CM | POA: Diagnosis not present

## 2016-03-02 DIAGNOSIS — E1122 Type 2 diabetes mellitus with diabetic chronic kidney disease: Secondary | ICD-10-CM | POA: Diagnosis not present

## 2016-03-02 LAB — BASIC METABOLIC PANEL
BUN: 25 mg/dL (ref 7–25)
CALCIUM: 9.1 mg/dL (ref 8.6–10.3)
CO2: 21 mmol/L (ref 20–31)
Chloride: 100 mmol/L (ref 98–110)
Creat: 1.8 mg/dL — ABNORMAL HIGH (ref 0.70–1.25)
Glucose, Bld: 90 mg/dL (ref 65–99)
Potassium: 4.1 mmol/L (ref 3.5–5.3)
SODIUM: 133 mmol/L — AB (ref 135–146)

## 2016-03-02 LAB — POCT GLYCOSYLATED HEMOGLOBIN (HGB A1C): Hemoglobin A1C: 7.1

## 2016-03-02 MED ORDER — PIOGLITAZONE HCL 30 MG PO TABS: 30.0000 mg | ORAL_TABLET | Freq: Every day | ORAL | 0 refills | Status: DC

## 2016-03-02 MED ORDER — METHYLPREDNISOLONE ACETATE 80 MG/ML IJ SUSP
80.0000 mg | Freq: Once | INTRAMUSCULAR | Status: AC
  Administered 2016-03-02: 80 mg via INTRAMUSCULAR

## 2016-03-02 NOTE — Patient Instructions (Signed)
It was good to see you.  I will call you with the results of your tests if your kidneys are not doing as well, otherwise I will mail you a letter. Your right knee should start feeling better over the next couple of days from the knee injection. Knee Injection, Care After Refer to this sheet in the next few weeks. These instructions provide you with information about caring for yourself after your procedure. Your health care provider may also give you more specific instructions. Your treatment has been planned according to current medical practices, but problems sometimes occur. Call your health care provider if you have any problems or questions after your procedure. WHAT TO EXPECT AFTER THE PROCEDURE After your procedure, it is common to have:  Soreness.  Warmth.  Swelling. You may have more pain, swelling, and warmth than you did before the injection. This reaction may last for about one day.  HOME CARE INSTRUCTIONS Bathing  If you were given a bandage (dressing), keep it dry until your health care provider says it can be removed. Ask your health care provider when you can start showering or taking a bath. Managing Pain, Stiffness, and Swelling  If directed, apply ice to the injection area:  Put ice in a plastic bag.  Place a towel between your skin and the bag.  Leave the ice on for 20 minutes, 2-3 times per day.  Do not apply heat to your knee.  Raise the injection area above the level of your heart while you are sitting or lying down. Activity  Avoid strenuous activities for as long as directed by your health care provider. Ask your health care provider when you can return to your normal activities. General Instructions  Take medicines only as directed by your health care provider.  Do not take aspirin or other over-the-counter medicines unless your health care provider says you can.  Check your injection site every day for signs of infection. Watch for:  Redness,  swelling, or pain.  Fluid, blood, or pus.  Follow your health care provider's instructions about dressing changes and removal. SEEK MEDICAL CARE IF:  You have symptoms at your injection site that last longer than two days after your procedure.  You have redness, swelling, or pain in your injection area.  You have fluid, blood, or pus coming from your injection site.  You have warmth in your injection area.  You have a fever.  Your pain is not controlled with medicine. SEEK IMMEDIATE MEDICAL CARE IF:  Your knee turns very red.  Your knee becomes very swollen.  Your knee pain is severe.   This information is not intended to replace advice given to you by your health care provider. Make sure you discuss any questions you have with your health care provider.   Document Released: 07/16/2014 Document Reviewed: 07/16/2014 Elsevier Interactive Patient Education [employer].

## 2016-03-02 NOTE — Assessment & Plan Note (Signed)
A1c slightly increased to 7.1 today. Most likely from pain/stress. Pt also notes he has gained a few pounds which may have contributed.

## 2016-03-02 NOTE — Assessment & Plan Note (Signed)
Pain of R knee likely due to underlying degenerative changes. New onset pain most likely present as it is more noticeable since his L knee was injected. X-rays from 2016 confirm degenerative changes. - R steroid knee injection as above, tolerated well. - RICE, continue to move that knee however try to avoid weight bearing for the next day. - continue Tylenol PRN, avoiding NSAIDS given CKD - f/u prn

## 2016-03-02 NOTE — Progress Notes (Signed)
Subjective: CC: R knee pain HPI: Patient is a 60 y.o. male with a past medical history of DM, CKD, OA of knees bilaterally presenting to clinic today for  R knee pain.  Has had a chronic R knee that now feels worse since he had the injection in the L knee. He notes his L knee responded very well to the injection.  R knee is weaker and painful when going from sitting to standing position. It's also very bothersome to use stairs. +cracking and popping. Occasionally feels like it gives out. He notes swelling of the R knee, no warmth. Never locks. No new injuries recently, was injured when he was 60.  Diabetes:  CBGs at home: 100s Taking medications: Actos Side effects: none ROS: denies fever, chills, dizziness, LOC, polyuria, polydipsia, numbness or tingling in extremities or chest pain. Last A1c: 6.7 Nephropathy screen indicated?: no  Social History: non smoker  Health Maintenance: due for eye exam  ROS: All other systems reviewed and are negative.  Past Medical History Patient Active Problem List   Diagnosis Date Noted  . Osteoarthritis of both knees 12/26/2015  . Constipation 05/10/2015  . Dizziness 04/20/2015  . Nevus of toe 04/20/2015  . Foot lesion 03/24/2015  . Bilateral foot pain 10/26/2014  . Impairment of balance 09/02/2014  . History of stroke 09/02/2014  . Tooth pain 01/28/2014  . De Quervain's tenosynovitis, right 11/05/2013  . Pinguecula of both eyes 09/23/2013  . Chronic hyponatremia 09/23/2013  . Elevated serum creatinine 05/25/2013  . Hypothyroidism 05/25/2013  . Memory loss 05/21/2013  . Rash and nonspecific skin eruption 05/21/2013  . Personal history of colonic adenomas 04/01/2013  . Unspecified constipation 01/13/2013  . Joint pain 05/06/2011  . OBESITY, UNSPECIFIED 05/20/2009  . Knee pain, bilateral 04/28/2009  . LUMBAGO 03/25/2009  . PSEUDOFOLLICULITIS BARBAE 123456  . SARCOIDOSIS 02/20/2007  . Hyperlipidemia associated with type 2  diabetes mellitus ([place]) 02/20/2007  . DM (diabetes mellitus), type 2 with renal complications ([place]) 99991111  . ERECTILE DYSFUNCTION 10/18/2006  . 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 Essential hypertension 10/18/2006  . CKD (chronic kidney disease) stage 3, GFR 30-59 ml/min 09/05/2006    Medications- reviewed and updated  Objective: Office vital signs reviewed. BP 140/68 (BP Location: Right Arm, Patient Position: Sitting, Cuff Size: Normal)   Pulse 62   Temp 98.7 F (37.1 C) (Oral)   Ht 6\' 1"  (1.854 m)   Wt 221 lb 3.2 oz (100.3 kg)   SpO2 97%   BMI 29.18 kg/m    Physical Examination:  General: Awake, alert, well- nourished, NAD Cardio: RRR, no m/r/g noted.  Pulm: No increased WOB.  CTAB, without wheezes, rhonchi or crackles noted.  Right knee: Normal to inspection without erythema, ecchymoses. Mild effusion.   No obvious Baker's cysts Palpation normal with no warmth or joint line tenderness or patellar tenderness or condyle tenderness.  No TTP along infrapatellar or pes anserine bursas.  ROM normal in flexion (135 degrees) and extension (0 degrees) and lower leg rotation with notable crepitus. Ligaments with solid consistent endpoints including ACL, PCL, LCL, MCL.  Negative Anterior Drawer. Negative Mcmurray's.  Non painful patellar compression.  Normal Patellar glide.  No apprehension  Patellar and quadriceps tendons unremarkable. Hamstring and quadriceps strength is normal.  Procedure note After informed written consent was obtained, patient was seated on exam table. Right  knee was prepped with jodine and alcohol swab. Utilizing anteromedial  approach, patient's right knee was injected intraarticularly  with mixture of 1cc of depomedrol 40mg /mL and 4cc of 1% lidocaine without epinephrine. Patient tolerated the procedure well without immediate complications   Assessment/Plan: Osteoarthritis of both knees Pain of R knee likely due to underlying  degenerative changes. New onset pain most likely present as it is more noticeable since his L knee was injected. X-rays from 2016 confirm degenerative changes. - R steroid knee injection as above, tolerated well. - RICE, continue to move that knee however try to avoid weight bearing for the next day. - continue Tylenol PRN, avoiding NSAIDS given CKD - f/u prn  DM (diabetes mellitus), type 2 with renal complications 123456 slightly increased to 7.1 today. Most likely from pain/stress. Pt also notes he has gained a few pounds which may have contributed.    Orders Placed This Encounter  Procedures  . Basic Metabolic Panel  . HgB A1c    Meds ordered this encounter  Medications  . DISCONTD: pioglitazone (ACTOS) 30 MG tablet    Sig: Take 1 tablet (30 mg total) by mouth daily.    Dispense:  30 tablet    Refill:  0  . pioglitazone (ACTOS) 30 MG tablet    Sig: Take 1 tablet (30 mg total) by mouth daily.    Dispense:  90 tablet    Refill:  0  . methylPREDNISolone acetate (DEPO-MEDROL) injection 80 mg    Archie Patten PGY-3, [place]

## 2016-03-16 ENCOUNTER — Other Ambulatory Visit: Payer: Self-pay | Admitting: Family Medicine

## 2016-03-16 NOTE — Telephone Encounter (Signed)
Needs refill on pioglitazone. He would like to have this sent to [facility].

## 2016-04-03 ENCOUNTER — Ambulatory Visit (INDEPENDENT_AMBULATORY_CARE_PROVIDER_SITE_OTHER): Payer: Commercial Managed Care - HMO | Admitting: Family Medicine

## 2016-04-03 ENCOUNTER — Encounter: Payer: Self-pay | Admitting: Family Medicine

## 2016-04-03 VITALS — BP 112/67 | HR 57 | Temp 99.3°F | Wt 223.6 lb

## 2016-04-03 DIAGNOSIS — Z23 Encounter for immunization: Secondary | ICD-10-CM

## 2016-04-03 DIAGNOSIS — N183 Chronic kidney disease, stage 3 unspecified: Secondary | ICD-10-CM

## 2016-04-03 DIAGNOSIS — I10 Essential (primary) hypertension: Secondary | ICD-10-CM | POA: Diagnosis not present

## 2016-04-03 DIAGNOSIS — Z0000 Encounter for general adult medical examination without abnormal findings: Secondary | ICD-10-CM

## 2016-04-03 MED ORDER — PIOGLITAZONE HCL 30 MG PO TABS: 30.0000 mg | ORAL_TABLET | Freq: Every day | ORAL | 3 refills | Status: DC

## 2016-04-03 MED ORDER — ZOSTER VACCINE LIVE 19400 UNT/0.65ML ~~LOC~~ SUSR: 0.6500 mL | Freq: Once | SUBCUTANEOUS | 0 refills | Status: AC

## 2016-04-03 NOTE — Patient Instructions (Addendum)
If you notice pain in your wrist/thumb again, come in so we can check your uric acid level.  STOP the spironolactone. Follow up with me in 1 month so we can keep a good eye on your blood pressure.  I have given you a prescription for the Zostavax (shingles vaccine) that you can get from your pharmacy.  I have referred you to the kidney doctor to ensure we have optimized your treatment.

## 2016-04-03 NOTE — Progress Notes (Signed)
Subjective: CC: f/u DM HPI: Patient is a 60 y.o. male with a past medical history of CKD stage III and knee pain presenting to clinic today.  Chronic knee pain, bilateral leg popping. Had injection in R knee and notes it is improved but not gone. Some pain with stairs still. Tylenol helps.   Had R wrist pain x 2 days. No swelling or redness. Movement made it worse. Felt sharp in nature, moderate pain. Deep palpation made it hurt. Notes no trauma. States it happened before and resolved with 2 days. No fevers or chills. He notes Tylenol ES improved the pain.   SCr: avoiding NSAIDs. No swelling. Never seen renal  Hypertension  Blood pressure at home: doesn't check  Meds: Compliant with all medications.  Side effects: none ROS: Denies headache, dizziness, visual changes, nausea, vomiting, chest pain, abdominal pain or shortness of breath.   Social History: never smoker  Health Maintenance: due for flu vaccine and Zostavax.  ROS: All other systems reviewed and are negative besides that noted in HPI.  Past Medical History Patient Active Problem List   Diagnosis Date Noted  . Healthcare maintenance 04/04/2016  . Osteoarthritis of both knees 12/26/2015  . Constipation 05/10/2015  . Foot lesion 03/24/2015  . Bilateral foot pain 10/26/2014  . Impairment of balance 09/02/2014  . History of stroke 09/02/2014  . De Quervain's tenosynovitis, right 11/05/2013  . Pinguecula of both eyes 09/23/2013  . Chronic hyponatremia 09/23/2013  . Hypothyroidism 05/25/2013  . Memory loss 05/21/2013  . Personal history of colonic adenomas 04/01/2013  . Unspecified constipation 01/13/2013  . Joint pain 05/06/2011  . OBESITY, UNSPECIFIED 05/20/2009  . Knee pain, bilateral 04/28/2009  . LUMBAGO 03/25/2009  . PSEUDOFOLLICULITIS BARBAE 24/26/8341  . SARCOIDOSIS 02/20/2007  . Hyperlipidemia associated with type 2 diabetes mellitus ([place]) 02/20/2007  . DM (diabetes mellitus), type 2 with renal  complications ([place]) 96/22/2979  . ERECTILE DYSFUNCTION 10/18/2006  . 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 Essential hypertension 10/18/2006  . CKD (chronic kidney disease) stage 3, GFR 30-59 ml/min 09/05/2006    Medications- reviewed and updated Current Outpatient Prescriptions  Medication Sig Dispense Refill  . allopurinol (ZYLOPRIM) 100 MG tablet Take 1 tablet (100 mg total) by mouth daily. 90 tablet 3  . amLODipine (NORVASC) 10 MG tablet TAKE 1 TABLET ONE TIME DAILY 90 tablet 1  . aspirin 81 MG chewable tablet Chew 1 tablet (81 mg total) by mouth daily. 90 tablet 1  . Blood Glucose Monitoring Suppl (BLOOD GLUCOSE METER) kit Use to test fasting glucose daily.  Diagnosis Type II diabetes 250.0 (Patient not taking: Reported on 11/24/2015) 1 each 0  . enalapril (VASOTEC) 20 MG tablet Take 2 tablets (40 mg total) by mouth daily. 180 tablet 1  . glucose blood (TRUETEST TEST) test strip Use to test blood sugar twice daily for type II diabetes (E11.29) (Patient not taking: Reported on 11/24/2015) 200 each 3  . levothyroxine (SYNTHROID, LEVOTHROID) 75 MCG tablet Take 1 tablet (75 mcg total) by mouth daily. 90 tablet 1  . lovastatin (MEVACOR) 40 MG tablet Take 1 tablet (40 mg total) by mouth at bedtime. 90 tablet 1  . pioglitazone (ACTOS) 30 MG tablet Take 1 tablet (30 mg total) by mouth daily. 90 tablet 3  . PRODIGY LANCETS 26G MISC Use to check fasting blood glucose each morning.  Diagnosis: Diabetes Mellitus Type II 250.00 (Patient not taking: Reported on 11/24/2015) 100 each 12  . tadalafil (CIALIS) 10 MG  tablet Take 1 tablet (10 mg total) by mouth daily as needed for erectile dysfunction. (Patient not taking: Reported on 11/24/2015) 5 tablet 3  . traMADol (ULTRAM) 50 MG tablet Take 1 tablet (50 mg total) by mouth every 8 (eight) hours as needed. 30 tablet 0  . traZODone (DESYREL) 100 MG tablet Take 1 tablet (100 mg total) by mouth at bedtime. 90 tablet 1  . triamcinolone cream  (KENALOG) 0.1 % Apply 1 application topically 2 (two) times daily. 30 g 1   No current facility-administered medications for this visit.     Objective: Office vital signs reviewed. BP 112/67   Pulse (!) 57   Temp 99.3 F (37.4 C) (Oral)   Wt 223 lb 9.6 oz (101.4 kg)   BMI 29.50 kg/m    Physical Examination:  General: Awake, alert, well- nourished, NAD Cardio: slightly bradycardic, regular rhythm, no m/r/g noted.  Pulm: No increased WOB.  CTAB, without wheezes, rhonchi or crackles noted.  Extremities: no pitting edema, normal to inspection. MSK: chronic atrophy of the R thenar eminence (stable). No swelling, erythema, or bruising. No tenderness to palpation. Full AROM without pain. R wrist normal to inspection. No tenderness to palpation. Full AROM.  Skin: dry, intact, no rashes or lesions Neuro: Strength and sensation grossly intact, DTRs 2/4  Assessment/Plan: Essential hypertension BPs at goal per JNC 8 guidelines. Pt on Aldactone at home, does not have any other indication for this medication. Patient's SCr continues to be elevated. Will hold Aldactone and f/u BPs.   Osteoarthritis of both knees Stable. Doing well after injection.  CKD (chronic kidney disease) stage 3, GFR 30-59 ml/min Slight elevation of SCr over the last year. Last A1c was 7.1 -  Will refer to renal to ensure optimal management  - Will d/c spironolactone.   Healthcare maintenance Flu vaccine given today  Rx for Zostavax given to the patient.   Right wrist pain: no abnormalities on exam. The patient notes it has completely resolved after 2 days. No worrisome signs such as fevers, chills or h/o trauma to suggest fracture. No point tenderness. Denies swelling or warmth, however I am curious if this was a gout flare. Pt notes he had the same thought. He's currently on allopurinol 174m daily. Asked pt to make an appt the next time this occurs, could consider repeat serum uric acid level at that time and  titrate up as needed. Pt voiced understanding.    Orders Placed This Encounter  Procedures  . Flu Vaccine QUAD 36+ mos IM  . Ambulatory referral to Nephrology    Referral Priority:   Routine    Referral Type:   Consultation    Referral Reason:   Specialty Services Required    Requested Specialty:   Nephrology    Number of Visits Requested:   1    Meds ordered this encounter  Medications  . pioglitazone (ACTOS) 30 MG tablet    Sig: Take 1 tablet (30 mg total) by mouth daily.    Dispense:  90 tablet    Refill:  3  . Zoster Vaccine Live, PF, (ZOSTAVAX) [id number]/0.65ML injection    Sig: Inject 19,400 Units into the skin once.    Dispense:  1 each    Refill:  0    [place]

## 2016-04-04 DIAGNOSIS — Z0000 Encounter for general adult medical examination without abnormal findings: Secondary | ICD-10-CM | POA: Insufficient documentation

## 2016-04-04 NOTE — Assessment & Plan Note (Signed)
Slight elevation of SCr over the last year. Last A1c was 7.1 -  Will refer to renal to ensure optimal management  - Will d/c spironolactone.

## 2016-04-04 NOTE — Assessment & Plan Note (Addendum)
BPs at goal per JNC 8 guidelines. Pt on Aldactone at home, does not have any other indication for this medication. Patient's SCr continues to be elevated. Will hold Aldactone and f/u BPs.

## 2016-04-04 NOTE — Assessment & Plan Note (Signed)
Flu vaccine given today  Rx for Zostavax given to the patient.

## 2016-04-04 NOTE — Assessment & Plan Note (Signed)
Stable. Doing well after injection.

## 2016-04-12 ENCOUNTER — Telehealth: Payer: Self-pay | Admitting: Family Medicine

## 2016-04-12 NOTE — Telephone Encounter (Signed)
Please call and make sure the patient knows about his appt with [facility] with Dr. Florene Glen on 04/24/16 at 3:45pm. He should arrive 30 minutes early.  Thanks,  Crystal D.

## 2016-04-18 NOTE — Telephone Encounter (Signed)
Left message on voicemail informing of message below.

## 2016-04-24 DIAGNOSIS — I10 Essential (primary) hypertension: Secondary | ICD-10-CM | POA: Diagnosis not present

## 2016-04-24 DIAGNOSIS — I639 Cerebral infarction, unspecified: Secondary | ICD-10-CM | POA: Diagnosis not present

## 2016-04-24 DIAGNOSIS — N183 Chronic kidney disease, stage 3 (moderate): Secondary | ICD-10-CM | POA: Diagnosis not present

## 2016-04-24 DIAGNOSIS — E669 Obesity, unspecified: Secondary | ICD-10-CM | POA: Diagnosis not present

## 2016-04-26 ENCOUNTER — Other Ambulatory Visit: Payer: Self-pay | Admitting: Nephrology

## 2016-04-26 DIAGNOSIS — N183 Chronic kidney disease, stage 3 unspecified: Secondary | ICD-10-CM

## 2016-04-30 ENCOUNTER — Other Ambulatory Visit: Payer: Self-pay | Admitting: Family Medicine

## 2016-04-30 MED ORDER — CYCLOBENZAPRINE HCL 10 MG PO TABS: 10.0000 mg | ORAL_TABLET | Freq: Three times a day (TID) | ORAL | 0 refills | Status: DC | PRN

## 2016-04-30 MED ORDER — TRAMADOL HCL 50 MG PO TABS: 50.0000 mg | ORAL_TABLET | Freq: Three times a day (TID) | ORAL | 0 refills | Status: DC | PRN

## 2016-04-30 NOTE — Telephone Encounter (Signed)
Having muscle spasms in the back of both legs all weekend. Is in a lot of pain and is hard to walk.  Would like some pain medicine [facility] [address] Please cal him when this has been called in

## 2016-04-30 NOTE — Telephone Encounter (Signed)
Please let the patient know I have called in a Rx for flexeril (muscle relaxer) and tramadol (pain med). He should try to f/u in the clinic if his pain is continuing or failing to improve.  Archie Patten, MD [facility] Family Medicine Resident  04/30/2016, 12:22 PM

## 2016-04-30 NOTE — Telephone Encounter (Signed)
Left message on voicemail informing of message from MD. 

## 2016-04-30 NOTE — Telephone Encounter (Signed)
3rd request. Pt states he can't it anymore and needs pain medication. Please advise. Thanks! ep

## 2016-04-30 NOTE — Telephone Encounter (Signed)
2nd request. Pt states he is in a lot of pain. Please advise. Thanks! ep

## 2016-05-04 ENCOUNTER — Ambulatory Visit
Admission: RE | Admit: 2016-05-04 | Discharge: 2016-05-04 | Disposition: A | Discharge status: Home / Self Care | Payer: Commercial Managed Care - HMO | Source: Ambulatory Visit | Attending: Nephrology | Admitting: Nephrology

## 2016-05-04 DIAGNOSIS — N183 Chronic kidney disease, stage 3 unspecified: Secondary | ICD-10-CM

## 2016-05-15 ENCOUNTER — Encounter: Payer: Self-pay | Admitting: Family Medicine

## 2016-05-15 ENCOUNTER — Ambulatory Visit (INDEPENDENT_AMBULATORY_CARE_PROVIDER_SITE_OTHER): Payer: Commercial Managed Care - HMO | Admitting: Family Medicine

## 2016-05-15 VITALS — BP 130/69 | HR 65 | Temp 98.6°F | Ht 73.0 in | Wt 224.2 lb

## 2016-05-15 DIAGNOSIS — R252 Cramp and spasm: Secondary | ICD-10-CM | POA: Diagnosis not present

## 2016-05-15 DIAGNOSIS — M791 Myalgia, unspecified site: Secondary | ICD-10-CM

## 2016-05-15 DIAGNOSIS — I10 Essential (primary) hypertension: Secondary | ICD-10-CM

## 2016-05-15 MED ORDER — CYCLOBENZAPRINE HCL 10 MG PO TABS: 10.0000 mg | ORAL_TABLET | Freq: Three times a day (TID) | ORAL | 0 refills | Status: DC | PRN

## 2016-05-15 NOTE — Progress Notes (Signed)
Subjective: CC:leg cramping HPI: Patient is a 60 y.o. male with a past medical history of CKD stage 3, T2DM, HLD presenting to clinic today for concerns for muscle cramping/spasms.  Muscle cramps during the day in hamstrings. Occurrs over the last 2 weeks and seems to be more frequent. He is okay at night typically, but throughout the day it is very bothersome. Also notes cramping on the anterolateral thigh.  Muscle relaxer and Tylenol arthritis help but its's transient. Notes that he had similar symptoms last year around this time that spontaneously resolved. No fevers or chills. He denies any recent injury or change in exertion/physical activity. He states that after his last knee injection that knee feels better, but the subsequent is bothering him again. He's wearing a brace on that knee now.   Patient stopped lovastatin for 3 days in case that was the issue, he didn't note a change. He re-started it last night.   Social History: non smoker   Health Maintenance: due for foot exam   ROS: All other systems reviewed and are negative.  Past Medical History Patient Active Problem List   Diagnosis Date Noted  . Myalgia 05/16/2016  . Healthcare maintenance 04/04/2016  . Osteoarthritis of both knees 12/26/2015  . Foot lesion 03/24/2015  . Bilateral foot pain 10/26/2014  . Impairment of balance 09/02/2014  . History of stroke 09/02/2014  . De Quervain's tenosynovitis, right 11/05/2013  . Pinguecula of both eyes 09/23/2013  . Chronic hyponatremia 09/23/2013  . Hypothyroidism 05/25/2013  . Memory loss 05/21/2013  . Personal history of colonic adenomas 04/01/2013  . Joint pain 05/06/2011  . OBESITY, UNSPECIFIED 05/20/2009  . Knee pain, bilateral 04/28/2009  . LUMBAGO 03/25/2009  . PSEUDOFOLLICULITIS BARBAE 123456  . SARCOIDOSIS 02/20/2007  . Hyperlipidemia associated with type 2 diabetes mellitus ([place]) 02/20/2007  . DM (diabetes mellitus), type 2 with renal complications ([place])  [id number]  . ERECTILE DYSFUNCTION 10/18/2006  . 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 Essential hypertension 10/18/2006  . CKD (chronic kidney disease) stage 3, GFR 30-59 ml/min 09/05/2006    Medications- reviewed and updated  Objective: Office vital signs reviewed. BP 130/69   Pulse 65   Temp 98.6 F (37 C) (Oral)   Ht 6\' 1"  (1.854 m)   Wt 224 lb 3.2 oz (101.7 kg)   BMI 29.58 kg/m    Physical Examination:  General: Awake, alert, well- nourished, NAD Cardio: RRR, no m/r/g noted.  Pulm: No increased WOB.  CTAB, without wheezes, rhonchi or crackles noted.  Extremities: 2+ DP pulses bilaterally. Small calloused area at tip of left great toe (pt cut nails too short per his report). No ulcerations noted. Normal monofilament exam.  MSK: Normal gait and station. Patient without tenderness to palpation. Cramping in the hamstrings and anterolateral thigh with knee flexion on the right leg. Weak abductors. Pain with hip abduction bilaterally in the anterolateral thigh going down towards his knee. 5/5 strength in the knees bilaterally. Skin: dry, intact, no rashes or lesions   Assessment/Plan: Essential hypertension BP at goal.   Myalgia Cramping/spasms noted in hamstrings and anterolateral aspect of thighs around IT band. Discussed this could be secondary to abductor weakness. Would also like to check a CK, however lovastatin is not a medication typically know to cause myalgias (especially in this specific distribution). Will also check a potassium level. Patient provide information on hip abductor strengthening exercises.    Orders Placed This Encounter  Procedures  . BASIC METABOLIC PANEL  WITH GFR  . CK    Meds ordered this encounter  Medications  . cyclobenzaprine (FLEXERIL) 10 MG tablet    Sig: Take 1 tablet (10 mg total) by mouth 3 (three) times daily as needed for muscle spasms.    Dispense:  30 tablet    Refill:  [place] PGY-3, [place]

## 2016-05-15 NOTE — Patient Instructions (Addendum)
It was nice to see you again. We will check some labs to see if your cholesterol medication is to blame for your cramping. For now I would like for you to continue your cholesterol medication. Start performing the stretches that are listed below. These will help your abductors.   TREATMENT  Treatment first involves the use of ice and medicine, to reduce pain and inflammation. The use of strengthening and stretching exercises may help reduce pain with activity. These exercises may be performed at home or with a therapist. For individuals with flat feet, an arch support (orthotic) may be helpful. Some individuals find that wearing a knee sleeve or compression bandage around the knee during workouts provides some relief. Certain training techniques, such as adjusting stride length, avoiding running on hills or stairs, changing the direction you run on a circular or banked track, or changing the side of the road you run on, if you run next to the curb, may help decrease symptoms of IT band syndrome. Cyclists may need to change the seat height or foot position on their bicycles. An injection of cortisone into the bursa may be recommended. Surgery to remove the inflamed bursa and/or part of the IT band is only considered after at least 6 months of non-surgical treatment.    HEAT AND COLD  Cold treatment (icing) should be applied for 10 to 15 minutes every 2 to 3 hours for inflammation and pain, and immediately after activity that aggravates your symptoms. Use ice packs or an ice massage.  Heat treatment may be used before performing stretching and strengthening activities prescribed by your caregiver, physical therapist, or athletic trainer. Use a heat pack or a warm water soak. SEEK MEDICAL CARE IF:   Symptoms get worse or do not improve in 2 to 4 weeks, despite treatment.  New, unexplained symptoms develop. (Drugs used in treatment may produce side effects.) EXERCISES  RANGE OF MOTION (ROM) AND  STRETCHING EXERCISES - Iliotibial Band Syndrome These exercises may help you when beginning to rehabilitate your injury. Your symptoms may go away with or without further involvement from your physician, physical therapist or athletic trainer. While completing these exercises, remember:   Restoring tissue flexibility helps normal motion to return to the joints. This allows healthier, less painful movement and activity.  An effective stretch should be held for at least 30 seconds.  A stretch should never be painful. You should only feel a gentle lengthening or release in the stretched tissue. STRETCH - Quadriceps, Prone   Lie on your stomach on a firm surface, such as a bed or padded floor.  Bend your right / left knee and grasp your ankle. If you are unable to reach your ankle or pant leg, use a belt around your foot to lengthen your reach.  Gently pull your heel toward your buttocks. Your knee should not slide out to the side. You should feel a stretch in the front of your thigh and knee.  Hold this position for __________ seconds. Repeat __________ times. Complete this stretch __________ times per day.  STRETCH - Iliotibial Band  On the floor or bed, lie on your side, so your right / left leg is on top. Bend your knee and grab your ankle.  Slowly bring your knee back so that your thigh is in line with your trunk. Keep your heel at your buttocks and gently arch your back, so your head, shoulders and hips line up.  Slowly lower your leg so that your knee  approaches the floor or bed, until you feel a gentle stretch on the outside of your right / left thigh. If you do not feel a stretch and your knee will not fall farther, place the heel of your opposite foot on top of your knee, and pull your thigh down farther.  Hold this stretch for __________ seconds. Repeat __________ times. Complete this stretch __________ times per day. STRENGTHENING EXERCISES - Iliotibial Band Syndrome Improving  the flexibility of the IT band will best relieve your discomfort due to IT band syndrome. Strengthening exercises, however, can help improve both muscle endurance and joint mechanics, reducing the factors that can contribute to this condition. Your physician, physical therapist or athletic trainer may provide you with exercises that train specific muscle groups that are especially weak. The following exercises target muscles that are often weak in people who have IT band syndrome. STRENGTH - Hip Abductors, Straight Leg Raises  Be aware of your form throughout the entire exercise, so that you exercise the correct muscles. Poor form means that you are not strengthening the correct muscles.  Lie on your side, so that your head, shoulders, knee and hip line up. You may bend your lower knee to help maintain your balance. Your right / left leg should be on top.  Roll your hips slightly forward, so that your hips are stacked directly over each other and your right / left knee is facing forward.  Lift your top leg up 4-6 inches, leading with your heel. Be sure that your foot does not drift forward and that your knee does not roll toward the ceiling.  Hold this position for __________ seconds. You should feel the muscles in your outer hip lifting (you may not notice this until your leg begins to tire).  Slowly lower your leg to the starting position. Allow the muscles to fully relax before beginning the next repetition. Repeat __________ times. Complete this exercise __________ times per day.  STRENGTH - Quad/VMO, Isometric  Sit in a chair with your right / left knee slightly bent. With your fingertips, feel the VMO muscle (just above the inside of your knee). The VMO is important in controlling the position of your kneecap.  Keeping your fingertips on this muscle. Without actually moving your leg, attempt to drive your knee down, as if straightening your leg. You should feel your VMO tense. If you have a  difficult time, you may wish to try the same exercise on your healthy knee first.  Tense this muscle as hard as you can, without increasing any knee pain.  Hold for __________ seconds. Relax the muscles slowly and completely between each repetition. Repeat __________ times. Complete this exercise __________ times per day.    This information is not intended to replace advice given to you by your health care provider. Make sure you discuss any questions you have with your health care provider.   Document Released: 06/25/2005 Document Revised: 07/16/2014 Document Reviewed: 10/07/2008 Elsevier Interactive Patient Education [employer].

## 2016-05-16 DIAGNOSIS — M791 Myalgia, unspecified site: Secondary | ICD-10-CM | POA: Insufficient documentation

## 2016-05-16 LAB — BASIC METABOLIC PANEL WITH GFR
BUN: 16 mg/dL (ref 7–25)
CHLORIDE: 104 mmol/L (ref 98–110)
CO2: 25 mmol/L (ref 20–31)
CREATININE: 1.6 mg/dL — AB (ref 0.70–1.25)
Calcium: 9.4 mg/dL (ref 8.6–10.3)
GFR, Est African American: 53 mL/min — ABNORMAL LOW (ref >=60)
GFR, Est Non African American: 46 mL/min — ABNORMAL LOW (ref >=60)
Glucose, Bld: 94 mg/dL (ref 65–99)
POTASSIUM: 4.5 mmol/L (ref 3.5–5.3)
Sodium: 136 mmol/L (ref 135–146)

## 2016-05-16 LAB — CK: CK TOTAL: 157 U/L (ref 7–232)

## 2016-05-16 NOTE — Assessment & Plan Note (Signed)
BP at goal 

## 2016-05-16 NOTE — Assessment & Plan Note (Signed)
Cramping/spasms noted in hamstrings and anterolateral aspect of thighs around IT band. Discussed this could be secondary to abductor weakness. Would also like to check a CK, however lovastatin is not a medication typically know to cause myalgias (especially in this specific distribution). Will also check a potassium level. Patient provide information on hip abductor strengthening exercises.

## 2016-05-21 ENCOUNTER — Other Ambulatory Visit: Payer: Self-pay | Admitting: Family Medicine

## 2016-05-23 ENCOUNTER — Telehealth: Payer: Self-pay | Admitting: *Deleted

## 2016-05-23 NOTE — Telephone Encounter (Signed)
Handicap sticker expires on 05/25/2016 and would like to get a new one filled out for him to pick up.  Patient would like a call back letting him know if this can be filled out or if he will need an appointment to see her to have this done.  Will forward to MD to advise.  Please respond back to Bernice

## 2016-05-24 NOTE — Telephone Encounter (Signed)
Please let the patient know that my portion is filled out and in an envelope at the front desk for him to pick up.  Thanks, Archie Patten, MD [facility] Family Medicine Resident  05/24/2016, 12:00 PM

## 2016-05-24 NOTE — Telephone Encounter (Signed)
Pt informed. Keylin Ferryman, CMA  

## 2016-05-25 ENCOUNTER — Telehealth (HOSPITAL_COMMUNITY): Payer: Self-pay | Admitting: Family Medicine

## 2016-05-25 ENCOUNTER — Encounter (HOSPITAL_COMMUNITY): Payer: Self-pay | Admitting: Family Medicine

## 2016-05-25 NOTE — Telephone Encounter (Signed)
error 

## 2016-05-28 ENCOUNTER — Other Ambulatory Visit: Payer: Self-pay | Admitting: Internal Medicine

## 2016-06-13 ENCOUNTER — Encounter: Payer: Self-pay | Admitting: Family Medicine

## 2016-06-13 ENCOUNTER — Ambulatory Visit (INDEPENDENT_AMBULATORY_CARE_PROVIDER_SITE_OTHER): Payer: Commercial Managed Care - HMO | Admitting: Family Medicine

## 2016-06-13 DIAGNOSIS — M25512 Pain in left shoulder: Secondary | ICD-10-CM

## 2016-06-13 MED ORDER — TRAMADOL HCL 50 MG PO TABS: 50.0000 mg | ORAL_TABLET | Freq: Three times a day (TID) | ORAL | 1 refills | Status: DC | PRN

## 2016-06-13 NOTE — Assessment & Plan Note (Signed)
Patient with a history of OA presenting with significant joint pain in the knees bilaterally, right hip, and left shoulder.  No red flags on exam or history. -Discussed taking Tylenol as needed for pain. -Prescription for tramadol 30 mg every 8 hours when necessary, #75 with 1 refill. -Discussed importance of not taking the tramadol around-the-clock due to tolerance. -Discussed importance of ice after activity. -Patient notes occasional "slippage" in his left knee, discussed using a soft support brace as needed. -Patient follow with me in 2 months or sooner as needed.

## 2016-06-13 NOTE — Progress Notes (Signed)
Subjective: CC: Arthritis HPI: Patient is a 60 y.o. male with a past medical history of CKD stage III, arthritis, hypertension presenting to clinic today for concerns of worsening arthritis.  The patient states that the weather has changed, he's had significant pain in his left shoulder, lumbar region, right hip, and bilateral knee pain.  He states that with his knee pain, they are both equally bothersome currently.  +cracking and popping. Occasionally feels like the left one gives out. He notes swelling of the knees bilaterally, but denies any erythema or warmth. Never locks. No new injuries recently. He states that he walks approximately 10 minutes per day and his neighborhood and he seems to help with stiffness, but movement in general makes his knee pain worse.  Additionally, he notes left shoulder pain with movement of his shoulder above 90. He states that this is been present for some time and only bothersome typically when it is cold outside. He denies any swelling, erythema, or warmth of the shoulder. He denies any tenderness to palpation.   For his lumbar pain, he states that it occurs in the midline and radiates to the hips bilaterally. He notes that this is worse with standing on his feet regularly. He also has a right hip pain that is very bothersome for him. He notes that he has pain that radiates from the right hip posteriorly down to his knee. He denies that the pain originates from his spinal column. He notes that he can't sit too long as this bothers his right hip. Prolonged standing or ambulation also makes it worse.    The patient felt he was doing well with tramadol every 8 hours. He states he's been out of tramadol and has been on Tylenol, but this is not seem to help.  Social History: Nonsmoker  Flu Vaccine: yes  Tdap Vaccine: yes  Pneumonia Vaccine: yes  ROS: All other systems reviewed and are negative.  Past Medical History Patient Active Problem List   Diagnosis Date Noted  . Myalgia 05/16/2016  . Healthcare maintenance 04/04/2016  . Osteoarthritis of both knees 12/26/2015  . Foot lesion 03/24/2015  . Bilateral foot pain 10/26/2014  . Impairment of balance 09/02/2014  . History of stroke 09/02/2014  . De Quervain's tenosynovitis, right 11/05/2013  . Pinguecula of both eyes 09/23/2013  . Chronic hyponatremia 09/23/2013  . Hypothyroidism 05/25/2013  . Memory loss 05/21/2013  . Personal history of colonic adenomas 04/01/2013  . Joint pain 05/06/2011  . OBESITY, UNSPECIFIED 05/20/2009  . Knee pain, bilateral 04/28/2009  . LUMBAGO 03/25/2009  . PSEUDOFOLLICULITIS BARBAE 37/04/6268  . SARCOIDOSIS 02/20/2007  . Hyperlipidemia associated with type 2 diabetes mellitus ([place]) 02/20/2007  . DM (diabetes mellitus), type 2 with renal complications ([place]) 48/54/6270  . ERECTILE DYSFUNCTION 10/18/2006  . 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 Essential hypertension 10/18/2006  . CKD (chronic kidney disease) stage 3, GFR 30-59 ml/min 09/05/2006    Medications- reviewed and updated Current Outpatient Prescriptions  Medication Sig Dispense Refill  . allopurinol (ZYLOPRIM) 100 MG tablet Take 1 tablet (100 mg total) by mouth daily. 90 tablet 3  . amLODipine (NORVASC) 10 MG tablet TAKE 1 TABLET EVERY DAY 90 tablet 1  . aspirin 81 MG chewable tablet Chew 1 tablet (81 mg total) by mouth daily. 90 tablet 1  . Blood Glucose Monitoring Suppl (BLOOD GLUCOSE METER) kit Use to test fasting glucose daily.  Diagnosis Type II diabetes 250.0 (Patient not taking: Reported on 11/24/2015) 1  each 0  . cyclobenzaprine (FLEXERIL) 10 MG tablet Take 1 tablet (10 mg total) by mouth 3 (three) times daily as needed for muscle spasms. 30 tablet 0  . enalapril (VASOTEC) 20 MG tablet TAKE 2 TABLETS EVERY DAY 180 tablet 1  . levothyroxine (SYNTHROID, LEVOTHROID) 75 MCG tablet Take 1 tablet (75 mcg total) by mouth daily. 90 tablet 1  . lovastatin (MEVACOR) 40  MG tablet Take 1 tablet (40 mg total) by mouth at bedtime. 90 tablet 1  . pioglitazone (ACTOS) 30 MG tablet Take 1 tablet (30 mg total) by mouth daily. 90 tablet 3  . PRODIGY LANCETS 26G MISC Use to check fasting blood glucose each morning.  Diagnosis: Diabetes Mellitus Type II 250.00 (Patient not taking: Reported on 11/24/2015) 100 each 12  . tadalafil (CIALIS) 10 MG tablet Take 1 tablet (10 mg total) by mouth daily as needed for erectile dysfunction. (Patient not taking: Reported on 11/24/2015) 5 tablet 3  . traMADol (ULTRAM) 50 MG tablet Take 1 tablet (50 mg total) by mouth every 8 (eight) hours as needed. 75 tablet 1  . traZODone (DESYREL) 100 MG tablet Take 1 tablet (100 mg total) by mouth at bedtime. 90 tablet 1  . triamcinolone cream (KENALOG) 0.1 % Apply 1 application topically 2 (two) times daily. 30 g 1  . TRUE METRIX BLOOD GLUCOSE TEST test strip USE TO TEST BLOOD SUGAR TWICE DAILY FOR TYPE II  DIABETES 200 each 3   No current facility-administered medications for this visit.     Objective: Office vital signs reviewed. BP 132/80   Pulse 67   Temp 98 F (36.7 C) (Oral)   Wt 224 lb (101.6 kg)   BMI 29.55 kg/m    Physical Examination:  General: Awake, alert, well- nourished, NAD, pleasant Cardio: RRR, no m/r/g noted.  Pulm: No increased WOB.  CTAB, without wheezes, rhonchi or crackles noted.  Shoulder, left: Normal to inspection. Tenderness in the [facility] joint. Unable to abduct greater than 90 secondary to pain. Significant resistance with passive abduction greater than 90. Difficult to perform full strength testing due to pain with range of motion. Positive Hawkins. Negative empty can test. Neurovascularly intact distally. Knee: Normal to inspection without erythema, ecchymoses. Mild effusion of the lateral aspect bilaterally.  No obvious Baker's cysts. Tenderness to palpation in the lateral joint line bilaterally.  No TTP along infrapatellar or pes anserine bursas.  ROM normal in  flexion (135 degrees) and extension (0 degrees) and lower leg rotation. Crepitus noticed in the right knee. Ligaments with solid consistent endpoints including ACL, PCL, LCL, MCL.  Negative Anterior Drawer. Negative Mcmurray's.  Non painful patellar compression.  Normal Patellar glide.  No apprehension  Patellar and quadriceps tendons unremarkable. Hamstring and quadriceps strength is normal. Back: Normal to inspection. Tenderness to the paraspinal muscles bilaterally. Negative straight leg raise. 5 out of 5 strength in the lower extremities bilaterally. Sensation intact grossly.   Assessment/Plan: Joint pain Patient with a history of OA presenting with significant joint pain in the knees bilaterally, right hip, and left shoulder.  No red flags on exam or history. -Discussed taking Tylenol as needed for pain. -Prescription for tramadol 30 mg every 8 hours when necessary, #75 with 1 refill. -Discussed importance of not taking the tramadol around-the-clock due to tolerance. -Discussed importance of ice after activity. -Patient notes occasional "slippage" in his left knee, discussed using a soft support brace as needed. -Patient follow with me in 2 months or sooner as needed.  No orders of the defined types were placed in this encounter.   Meds ordered this encounter  Medications  . traMADol (ULTRAM) 50 MG tablet    Sig: Take 1 tablet (50 mg total) by mouth every 8 (eight) hours as needed.    Dispense:  75 tablet    Refill:  [place] PGY-3, [place]

## 2016-06-13 NOTE — Patient Instructions (Signed)
I have refilled you tramadol with 1 refill. Try not to take it scheduled every 8 hours if you can, as you can eventually develop a tolerance to it. Try stretching and icing the area as well. If you feel like your knee is slipping, wearing a soft brace can be helpful for support.

## 2016-06-28 ENCOUNTER — Other Ambulatory Visit: Payer: Self-pay | Admitting: Internal Medicine

## 2016-06-28 ENCOUNTER — Other Ambulatory Visit: Payer: Self-pay | Admitting: Family Medicine

## 2016-07-16 ENCOUNTER — Telehealth: Payer: Self-pay | Admitting: *Deleted

## 2016-07-16 NOTE — Telephone Encounter (Signed)
Pt calling in stating that the pharmacy is sending you a letter about renewing his diabetes medicine. Pt scheduled an appt for tomorrow, if you have any questions. Diania Co Kennon Holter, CMA

## 2016-07-16 NOTE — Telephone Encounter (Signed)
I have not received a letter as of yet.  Archie Patten, MD [facility] Family Medicine Resident  07/16/2016, 3:34 PM

## 2016-07-17 ENCOUNTER — Encounter: Payer: Self-pay | Admitting: Family Medicine

## 2016-07-17 ENCOUNTER — Ambulatory Visit (INDEPENDENT_AMBULATORY_CARE_PROVIDER_SITE_OTHER): Payer: Medicare HMO | Admitting: Family Medicine

## 2016-07-17 VITALS — BP 130/80 | HR 77 | Temp 98.6°F | Ht 72.0 in | Wt 220.0 lb

## 2016-07-17 DIAGNOSIS — E1122 Type 2 diabetes mellitus with diabetic chronic kidney disease: Secondary | ICD-10-CM

## 2016-07-17 DIAGNOSIS — I10 Essential (primary) hypertension: Secondary | ICD-10-CM | POA: Diagnosis not present

## 2016-07-17 LAB — POCT GLYCOSYLATED HEMOGLOBIN (HGB A1C): Hemoglobin A1C: 6.8

## 2016-07-17 NOTE — Patient Instructions (Signed)
It was good to see you again. Keep up the good work. Continue with your current medications. Your A1c is improved, you're new goal is to get your A1c down to 6.5 or less. Right now your goal blood pressure is <140/90, however we may change this to <130/80 in the coming months depending on what the [place] determines from their research over the next few months.

## 2016-07-17 NOTE — Progress Notes (Signed)
    Subjective: CC: f/u DM  HPI: Patient is a 61 y.o. male with a past medical history of CKD stage III, arthritis, hypertension, DM presenting to clinic today for f/u DM.    Diabetes:  CBGs at home: 90-100s Taking medications: compliant on Actos Side effects: none  ROS: denies fever, chills, dizziness, LOC, polyuria, polydipsia, numbness or tingling in extremities or chest pain. Last eye exam: up to date Last foot exam: up to date  Last A1c: 7.1  Nephropathy screen indicated?: on enalapril  Last flu, zoster and/or pneumovax: up to date  It's been too cold for walking recently but he's cautious about what he's eating.   Hypertension Blood pressure at home: doesn't check Meds: Compliant with amlodipine, enalapril  Side effects: none  ROS: Denies headache, dizziness, visual changes, nausea, vomiting, chest pain, abdominal pain or shortness of breath.   Flu Vaccine: yes  Tdap Vaccine: yes  Pneumonia Vaccine: yes  ROS: All other systems reviewed and are negative.  Past Medical History Patient Active Problem List   Diagnosis Date Noted  . Myalgia 05/16/2016  . Healthcare maintenance 04/04/2016  . Osteoarthritis of both knees 12/26/2015  . Foot lesion 03/24/2015  . Bilateral foot pain 10/26/2014  . Impairment of balance 09/02/2014  . History of stroke 09/02/2014  . De Quervain's tenosynovitis, right 11/05/2013  . Pinguecula of both eyes 09/23/2013  . Chronic hyponatremia 09/23/2013  . Hypothyroidism 05/25/2013  . Memory loss 05/21/2013  . Personal history of colonic adenomas 04/01/2013  . Joint pain 05/06/2011  . OBESITY, UNSPECIFIED 05/20/2009  . Knee pain, bilateral 04/28/2009  . LUMBAGO 03/25/2009  . PSEUDOFOLLICULITIS BARBAE [id number]  . SARCOIDOSIS 02/20/2007  . Hyperlipidemia associated with type 2 diabetes mellitus ([place]) 02/20/2007  . DM (diabetes mellitus), type 2 with renal complications ([place]) [id number]  . ERECTILE DYSFUNCTION 10/18/2006  . DSORD, ADJST  W/MIXED ANXIETY/DEPRESSED MOOD 10/18/2006  . Essential hypertension 10/18/2006  . CKD (chronic kidney disease) stage 3, GFR 30-59 ml/min 09/05/2006    Medications- reviewed and updated  Objective: Office vital signs reviewed. BP 130/80 (BP Location: Right Arm, Patient Position: Sitting, Cuff Size: Normal)   Pulse 77   Temp 98.6 F (37 C) (Oral)   Ht 6' (1.829 m)   Wt 220 lb (99.8 kg)   SpO2 98%   BMI 29.84 kg/m    Physical Examination:  General: Awake, alert, well- nourished, NAD, pleasant Cardio: RRR, no m/r/g noted. No thrills. No pitting edema. Pulm: No increased WOB.  CTAB, without wheezes, rhonchi or crackles noted.   A1c 6.8  Assessment/Plan: Essential hypertension BP at goal per JNC 8 guidelines, borderline per new suggested guidelines by ACA/AHA.  Will continue current regimen for now. No side effects -BMET at next visit  DM (diabetes mellitus), type 2 with renal complications Up to date on eye exam and foot exam. A1c improved from 7.1 to 6.8.  - continue Actos.  - f/u A1c in 3-6 months given stability of glycemic control   Orders Placed This Encounter  Procedures  . HgB A1c    No orders of the defined types were placed in this encounter.   Archie Patten PGY-3, [place]

## 2016-07-19 MED ORDER — ALLOPURINOL 100 MG PO TABS: 100.0000 mg | ORAL_TABLET | Freq: Every day | ORAL | 3 refills | Status: DC

## 2016-07-19 MED ORDER — ENALAPRIL MALEATE 20 MG PO TABS: 40.0000 mg | ORAL_TABLET | Freq: Every day | ORAL | 1 refills | Status: DC

## 2016-07-19 MED ORDER — PIOGLITAZONE HCL 30 MG PO TABS: 30.0000 mg | ORAL_TABLET | Freq: Every day | ORAL | 3 refills | Status: DC

## 2016-07-19 MED ORDER — AMLODIPINE BESYLATE 10 MG PO TABS: 10.0000 mg | ORAL_TABLET | Freq: Every day | ORAL | 1 refills | Status: DC

## 2016-07-19 MED ORDER — LOVASTATIN 40 MG PO TABS: 40.0000 mg | ORAL_TABLET | Freq: Every day | ORAL | 1 refills | Status: DC

## 2016-07-19 MED ORDER — LEVOTHYROXINE SODIUM 75 MCG PO TABS: 75.0000 ug | ORAL_TABLET | Freq: Every day | ORAL | 1 refills | Status: DC

## 2016-07-19 MED ORDER — ASPIRIN 81 MG PO CHEW: CHEWABLE_TABLET | ORAL | 1 refills | Status: DC

## 2016-07-19 NOTE — Assessment & Plan Note (Signed)
BP at goal per JNC 8 guidelines, borderline per new suggested guidelines by ACA/AHA.  Will continue current regimen for now. No side effects -BMET at next visit

## 2016-07-19 NOTE — Assessment & Plan Note (Signed)
Up to date on eye exam and foot exam. A1c improved from 7.1 to 6.8.  - continue Actos.  - f/u A1c in 3-6 months given stability of glycemic control

## 2016-07-31 ENCOUNTER — Encounter: Payer: Self-pay | Admitting: Family Medicine

## 2016-07-31 ENCOUNTER — Ambulatory Visit (INDEPENDENT_AMBULATORY_CARE_PROVIDER_SITE_OTHER): Payer: Medicare HMO | Admitting: Family Medicine

## 2016-07-31 VITALS — BP 110/72 | HR 75 | Temp 98.1°F | Ht 72.0 in | Wt 219.2 lb

## 2016-07-31 DIAGNOSIS — T148XXA Other injury of unspecified body region, initial encounter: Secondary | ICD-10-CM

## 2016-07-31 DIAGNOSIS — L600 Ingrowing nail: Secondary | ICD-10-CM | POA: Diagnosis not present

## 2016-07-31 MED ORDER — MUPIROCIN 2 % EX OINT: TOPICAL_OINTMENT | CUTANEOUS | 0 refills | Status: DC

## 2016-07-31 NOTE — Progress Notes (Signed)
Subjective: EI:[id number] on R big toe HPI: Patient is a 61 y.o. male presenting to clinic today for a SDA for blister on his R big toe.  Patient noted a blister on his R great toe yesterday. He lanced the blister last night and noted that last night the started having pain of the great toe that would radiate up the anterior calf approximately 1/4 the way up. He states he feels like he had this blister initially b/c he was attempting to cut back an ingrown toenail and injured himself. He felt like the toe was initially warm to touch yesterday but not today. He denies any purulent drainage. No fevers or chills. No pain, coolness to touch, swelling, numbness, or tingling. Sensation intact.  He came in today b/c he's worried as he has a h/o DM and wants to ensure that he doesn't need antibiotics.   Social History: non-smoker   Flu Vaccine: up to date   Tdap Vaccine: up to date    ROS: All other systems reviewed and are negative.  Past Medical History Patient Active Problem List   Diagnosis Date Noted  . Blister 08/01/2016  . Myalgia 05/16/2016  . Healthcare maintenance 04/04/2016  . Osteoarthritis of both knees 12/26/2015  . Foot lesion 03/24/2015  . Bilateral foot pain 10/26/2014  . Impairment of balance 09/02/2014  . History of stroke 09/02/2014  . De Quervain's tenosynovitis, right 11/05/2013  . Pinguecula of both eyes 09/23/2013  . Chronic hyponatremia 09/23/2013  . Hypothyroidism 05/25/2013  . Memory loss 05/21/2013  . Personal history of colonic adenomas 04/01/2013  . Joint pain 05/06/2011  . OBESITY, UNSPECIFIED 05/20/2009  . Knee pain, bilateral 04/28/2009  . LUMBAGO 03/25/2009  . PSEUDOFOLLICULITIS BARBAE [id number]  . SARCOIDOSIS 02/20/2007  . Hyperlipidemia associated with type 2 diabetes mellitus ([place]) 02/20/2007  . DM (diabetes mellitus), type 2 with renal complications ([place]) [id number]  . ERECTILE DYSFUNCTION 10/18/2006  . DSORD, ADJST W/MIXED ANXIETY/DEPRESSED  MOOD 10/18/2006  . Essential hypertension 10/18/2006  . CKD (chronic kidney disease) stage 3, GFR 30-59 ml/min 09/05/2006   Medications reviewed  Objective: Office vital signs reviewed. BP 110/72 (BP Location: Right Arm, Patient Position: Sitting, Cuff Size: Large)   Pulse 75   Temp 98.1 F (36.7 C) (Oral)   Ht 6' (1.829 m)   Wt 219 lb 3.2 oz (99.4 kg)   SpO2 99%   BMI 29.73 kg/m    Physical Examination:  General: Awake, alert, well nourished, NAD, pleasant  R great toe: mild violaceous discoloration around the nail bed. Area of loose dead skin overlying the distal/lateral aspect of the toe. No warmth to touch. No fluctuance noted. Minimal tenderness to palpation. No drainage noted. Full range of motion. Sensation intact. Symmetric DP pulses. Brisk capillary refill at the distal aspect of his toe.        Assessment/Plan: Blister Patient presenting with concerns of infection status post popping a blister on his great toe. No evidence of infection at the site of the blister. On examination, he does have some violaceous discoloration around the nailbed. He states that this is nontender and he has not noticed any drainage from the site. This is the beginning of a paronychia  given the appearance. Fortunately there is no coolness to touch, no warmth, and no fluctuance noted. No need for any I and D today. Patient has good pulses, brisk capillary refill and and normal sensation to suggest against any vascular compromise. He is no longer having the pain  that radiates up his foot, and he wonders if this could've been just secondary to the ingrown toenail.   Discussed warm baths at least twice daily. Prescribed Bactroban to apply after warm baths twice daily. Patient advised on symptoms to look out for such as warmth, increased tenderness, drainage, increased swelling, coolness to touch, change in sensation. Given his symptoms and his history of diabetes, I asked the patient to follow-up within  the next week for reevaluation.  Additionally, given the patient's concerns for maintenance of his on feet and the trauma that he has caused by attempting to trim his own nails, I have placed a referral to podiatry.   Orders Placed This Encounter  Procedures  . Ambulatory referral to Podiatry    Referral Priority:   Routine    Referral Type:   Consultation    Referral Reason:   Specialty Services Required    Requested Specialty:   Podiatry    Number of Visits Requested:   1    Meds ordered this encounter  Medications  . mupirocin ointment (BACTROBAN) 2 %    Sig: 1 application to the right great toe twice daily    Dispense:  22 g    Refill:  [facility] PGY-3, [place]

## 2016-07-31 NOTE — Patient Instructions (Signed)
Soak your foot in warm salt water twice daily. After that, apply the bactroban ointment to the area.  Follow up with [place] in 1 week to ensure that it is in fact improving. Come to clinic sooner if you note warmth (or cold to touch), worsening pain, drainage, or numbness of your toe.  I have referred you to podiatry.

## 2016-08-01 DIAGNOSIS — T148XXA Other injury of unspecified body region, initial encounter: Secondary | ICD-10-CM | POA: Insufficient documentation

## 2016-08-01 NOTE — Assessment & Plan Note (Addendum)
Patient presenting with concerns of infection status post popping a blister on his great toe. No evidence of infection at the site of the blister. On examination, he does have some violaceous discoloration around the nailbed. He states that this is nontender and he has not noticed any drainage from the site. This is the beginning of a paronychia  given the appearance. Fortunately there is no coolness to touch, no warmth, and no fluctuance noted. No need for any I and D today. Patient has good pulses, brisk capillary refill and and normal sensation to suggest against any vascular compromise. He is no longer having the pain that radiates up his foot, and he wonders if this could've been just secondary to the ingrown toenail.   Discussed warm baths at least twice daily. Prescribed Bactroban to apply after warm baths twice daily. Patient advised on symptoms to look out for such as warmth, increased tenderness, drainage, increased swelling, coolness to touch, change in sensation. Given his symptoms and his history of diabetes, I asked the patient to follow-up within the next week for reevaluation.  Additionally, given the patient's concerns for maintenance of his on feet and the trauma that he has caused by attempting to trim his own nails, I have placed a referral to podiatry.

## 2016-10-08 ENCOUNTER — Ambulatory Visit (INDEPENDENT_AMBULATORY_CARE_PROVIDER_SITE_OTHER): Payer: Medicare HMO | Admitting: Family Medicine

## 2016-10-08 VITALS — BP 130/80 | HR 59 | Temp 98.3°F | Ht 72.0 in | Wt 219.0 lb

## 2016-10-08 DIAGNOSIS — E1122 Type 2 diabetes mellitus with diabetic chronic kidney disease: Secondary | ICD-10-CM | POA: Diagnosis not present

## 2016-10-08 DIAGNOSIS — I10 Essential (primary) hypertension: Secondary | ICD-10-CM

## 2016-10-08 DIAGNOSIS — M170 Bilateral primary osteoarthritis of knee: Secondary | ICD-10-CM | POA: Diagnosis not present

## 2016-10-08 DIAGNOSIS — R531 Weakness: Secondary | ICD-10-CM | POA: Diagnosis not present

## 2016-10-08 LAB — POCT GLYCOSYLATED HEMOGLOBIN (HGB A1C): HEMOGLOBIN A1C: 7

## 2016-10-08 NOTE — Patient Instructions (Signed)
Continue your current blood sugar and high blood pressure medications.  I have referred you to sports medicine for your right knee weakness.  Continue Tylenol as needed for left knee pain.  I will send you a letter with your kidney function results if they are stable, I will call you if they are NOT normal.

## 2016-10-08 NOTE — Progress Notes (Signed)
Subjective: CC: f/u DM  HPI: Patient is a 61 y.o. male with a past medical history of CKD stage III, arthritis, hypertension, DM presenting to clinic today for f/u DM.   Knee weakness: Having significant R knee weakness. This is new for him.  Can't go up stairs with the right knee due to his R knee giving out. Denies pain with ascending or descending stairs, but does note that the R knee does intermittently hurt.  He doesn't feel the weakness and pain are temporally related.  Intermittently taking Tylenol for pain which seems to help. Mild swelling of the R knee. No warmth. No previous injury. He's had injections in the R knee in the past for pain which seemed to help, but denies his usual pain.  He notes intermittent instability but no falls. No locking. Intermittent popping sensation.  Diabetes:  CBGs at homme: 90-100s Taking medications: compliant on Actos Side effects: none  ROS: denies fever, chills, dizziness, LOC, polyuria, polydipsia, numbness or tingling in extremities or chest pain. Last eye exam: up to date (requests referral back to [facility]) Last foot exam: up to date  Last A1c: 6.8 07/2016  Nephropathy screen indicated?: on enalapril  Last flu, zoster and/or pneumovax: up to date  He watches what he eats, not walking as much due to weakness and cold.   Hypertension Blood pressure at home: doesn't check Meds: Compliant with amlodipine, enalapril  Side effects: none  ROS: Denies headache, dizziness, visual changes, nausea, vomiting, chest pain, abdominal pain or shortness of breath.   Flu Vaccine: yes  Tdap Vaccine: yes  Pneumonia Vaccine: yes  ROS: All other systems reviewed and are negative.  Past Medical History Patient Active Problem List   Diagnosis Date Noted  . Blister 08/01/2016  . Myalgia 05/16/2016  . Healthcare maintenance 04/04/2016  . Osteoarthritis of both knees 12/26/2015  . Foot lesion 03/24/2015  . Bilateral foot pain 10/26/2014  .  Impairment of balance 09/02/2014  . History of stroke 09/02/2014  . De Quervain's tenosynovitis, right 11/05/2013  . Pinguecula of both eyes 09/23/2013  . Chronic hyponatremia 09/23/2013  . Hypothyroidism 05/25/2013  . Memory loss 05/21/2013  . Personal history of colonic adenomas 04/01/2013  . Joint pain 05/06/2011  . OBESITY, UNSPECIFIED 05/20/2009  . Knee pain, bilateral 04/28/2009  . LUMBAGO 03/25/2009  . PSEUDOFOLLICULITIS BARBAE 02/54/2706  . SARCOIDOSIS 02/20/2007  . Hyperlipidemia associated with type 2 diabetes mellitus ([place]) 02/20/2007  . DM (diabetes mellitus), type 2 with renal complications ([place]) 23/76/2831  . ERECTILE DYSFUNCTION 10/18/2006  . 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 Essential hypertension 10/18/2006  . CKD (chronic kidney disease) stage 3, GFR 30-59 ml/min 09/05/2006    Medications- reviewed and updated  Objective: Office vital signs reviewed. BP 130/80 (BP Location: Left Arm, Patient Position: Sitting, Cuff Size: Normal)   Pulse (!) 59   Temp 98.3 F (36.8 C) (Oral)   Ht 6' (1.829 m)   Wt 219 lb (99.3 kg)   SpO2 98%   BMI 29.70 kg/m    Physical Examination:  General: Awake, alert, well- nourished, NAD, pleasant Cardio: RRR, no m/r/g noted. No thrills. No pitting edema. Pulm: No increased WOB.  CTAB, without wheezes, rhonchi or crackles noted.  Right knee: No erythema, ecchymoses. Very small effusion when compared to the left.  No obvious Baker's cysts No warmth or joint line tenderness. Mild tenderness over the anterior aspect of the knee.  No TTP along infrapatellar or pes anserine bursas.  ROM slightly decreased in flexion (120 degrees) and normal extension (0 degrees) and lower leg rotation. Mild crepitus but symmetric with left knee.  Ligaments with solid consistent endpoints including ACL, PCL, LCL, MCL.  Negative Mcmurray's. Non painful patellar compression.  Normal Patellar glide.  No apprehension  Patellar and  quadriceps tendons unremarkable. Hamstring and quadriceps strength is normal.  A1c 7.0  Assessment/Plan: Essential hypertension BP at goal per JNC 8 guidelines. - continue amlodipine and enalapril - check CMP  DM (diabetes mellitus), type 2 with renal complications Well controlled.  - foot exam up to date - has had flu and pneumococcal vaccines  - on enalapril and ASA - referral back for annual eye exam. - continue Actos  Osteoarthritis of both knees Interesting that he currently has no pain in the R knee. I suspect weakness could be from OA, however odd that there's not associated pain currently. No instability noted on exam.  - will hold off on steroid injection today. - avoiding NSAIDS due to CKD - continue Tylenol for pain - discussed the importance of trying to continue to strengthen quadriceps and hamstrings - referral to Sports Medicine.   Orders Placed This Encounter  Procedures  . Basic metabolic panel  . Ambulatory referral to Ophthalmology    Referral Priority:   Routine    Referral Type:   Consultation    Referral Reason:   Specialty Services Required    Requested Specialty:   Ophthalmology    Number of Visits Requested:   1  . Ambulatory referral to Sports Medicine    Referral Priority:   Routine    Referral Type:   Consultation    Number of Visits Requested:   1  . POCT glycosylated hemoglobin (Hb A1C)    No orders of the defined types were placed in this encounter.   Archie Patten PGY-3, [place]

## 2016-10-09 ENCOUNTER — Encounter: Payer: Self-pay | Admitting: Family Medicine

## 2016-10-09 LAB — BASIC METABOLIC PANEL
BUN/Creatinine Ratio: 11 (ref 10–24)
BUN: 18 mg/dL (ref 8–27)
CHLORIDE: 99 mmol/L (ref 96–106)
CO2: 25 mmol/L (ref 18–29)
Calcium: 9.3 mg/dL (ref 8.6–10.2)
Creatinine, Ser: 1.62 mg/dL — ABNORMAL HIGH (ref 0.76–1.27)
GFR calc Af Amer: 53 mL/min/{1.73_m2} — ABNORMAL LOW (ref >59)
GFR calc non Af Amer: 45 mL/min/{1.73_m2} — ABNORMAL LOW (ref >59)
GLUCOSE: 111 mg/dL — AB (ref 65–99)
POTASSIUM: 4.4 mmol/L (ref 3.5–5.2)
SODIUM: 141 mmol/L (ref 134–144)

## 2016-10-09 NOTE — Assessment & Plan Note (Signed)
Well controlled.  - foot exam up to date - has had flu and pneumococcal vaccines  - on enalapril and ASA - referral back for annual eye exam. - continue Actos

## 2016-10-09 NOTE — Assessment & Plan Note (Signed)
BP at goal per JNC 8 guidelines. - continue amlodipine and enalapril - check CMP

## 2016-10-09 NOTE — Assessment & Plan Note (Signed)
Interesting that he currently has no pain in the R knee. I suspect weakness could be from OA, however odd that there's not associated pain currently. No instability noted on exam.  - will hold off on steroid injection today. - avoiding NSAIDS due to CKD - continue Tylenol for pain - discussed the importance of trying to continue to strengthen quadriceps and hamstrings - referral to Sports Medicine.

## 2016-10-10 ENCOUNTER — Encounter: Payer: Self-pay | Admitting: Family Medicine

## 2016-10-22 ENCOUNTER — Ambulatory Visit (INDEPENDENT_AMBULATORY_CARE_PROVIDER_SITE_OTHER): Payer: Medicare HMO | Admitting: Sports Medicine

## 2016-10-22 ENCOUNTER — Encounter: Payer: Self-pay | Admitting: Sports Medicine

## 2016-10-22 VITALS — BP 140/71 | Ht 73.0 in | Wt 219.0 lb

## 2016-10-22 DIAGNOSIS — M170 Bilateral primary osteoarthritis of knee: Secondary | ICD-10-CM

## 2016-10-22 DIAGNOSIS — M79672 Pain in left foot: Secondary | ICD-10-CM | POA: Diagnosis not present

## 2016-10-22 DIAGNOSIS — M79671 Pain in right foot: Secondary | ICD-10-CM | POA: Diagnosis not present

## 2016-10-22 DIAGNOSIS — M205X2 Other deformities of toe(s) (acquired), left foot: Secondary | ICD-10-CM | POA: Diagnosis not present

## 2016-10-22 NOTE — Assessment & Plan Note (Signed)
-   Will refer to Dr. Amalia Hailey of Podiatry for surgical treatment options

## 2016-10-22 NOTE — Assessment & Plan Note (Signed)
-   Will fit for bilateral body helix compression sleeves
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
## 2016-10-22 NOTE — Assessment & Plan Note (Signed)
-   Pain localized to navicular bone. Question possible accessory navicular bone. Could not access foot x-ray images from 2008. - Try L arch strap to relieve pressure - Will refer to Podiatrist Dr. Amalia Hailey for further assessment - Recommend repeat imaging but will defer to Podiatry - Could consider custom orthotics but can continue Dr. Elissa Hefty inserts for now

## 2016-10-31 ENCOUNTER — Ambulatory Visit: Payer: Medicare HMO | Admitting: Podiatry

## 2016-11-19 DIAGNOSIS — H25013 Cortical age-related cataract, bilateral: Secondary | ICD-10-CM | POA: Diagnosis not present

## 2016-11-19 DIAGNOSIS — H2513 Age-related nuclear cataract, bilateral: Secondary | ICD-10-CM | POA: Diagnosis not present

## 2016-11-19 DIAGNOSIS — H52203 Unspecified astigmatism, bilateral: Secondary | ICD-10-CM | POA: Diagnosis not present

## 2016-11-19 DIAGNOSIS — E119 Type 2 diabetes mellitus without complications: Secondary | ICD-10-CM | POA: Diagnosis not present

## 2016-11-19 LAB — HM DIABETES EYE EXAM

## 2016-11-28 ENCOUNTER — Ambulatory Visit: Payer: Medicare HMO | Admitting: Podiatry

## 2016-11-28 DIAGNOSIS — H21561 Pupillary abnormality, right eye: Secondary | ICD-10-CM | POA: Diagnosis not present

## 2016-11-28 DIAGNOSIS — H25012 Cortical age-related cataract, left eye: Secondary | ICD-10-CM | POA: Diagnosis not present

## 2016-11-28 DIAGNOSIS — H25011 Cortical age-related cataract, right eye: Secondary | ICD-10-CM | POA: Diagnosis not present

## 2016-11-28 DIAGNOSIS — H2511 Age-related nuclear cataract, right eye: Secondary | ICD-10-CM | POA: Diagnosis not present

## 2016-11-28 DIAGNOSIS — H5703 Miosis: Secondary | ICD-10-CM | POA: Diagnosis not present

## 2016-11-28 DIAGNOSIS — H2512 Age-related nuclear cataract, left eye: Secondary | ICD-10-CM | POA: Diagnosis not present

## 2016-12-05 DIAGNOSIS — H2512 Age-related nuclear cataract, left eye: Secondary | ICD-10-CM | POA: Diagnosis not present

## 2016-12-05 DIAGNOSIS — H25012 Cortical age-related cataract, left eye: Secondary | ICD-10-CM | POA: Diagnosis not present

## 2016-12-05 DIAGNOSIS — H21562 Pupillary abnormality, left eye: Secondary | ICD-10-CM | POA: Diagnosis not present

## 2016-12-05 DIAGNOSIS — H5703 Miosis: Secondary | ICD-10-CM | POA: Diagnosis not present

## 2016-12-10 ENCOUNTER — Other Ambulatory Visit: Payer: Self-pay | Admitting: Family Medicine

## 2016-12-19 ENCOUNTER — Telehealth: Payer: Self-pay | Admitting: Family Medicine

## 2016-12-19 ENCOUNTER — Ambulatory Visit (INDEPENDENT_AMBULATORY_CARE_PROVIDER_SITE_OTHER): Payer: Medicare HMO | Admitting: *Deleted

## 2016-12-19 ENCOUNTER — Encounter: Payer: Self-pay | Admitting: *Deleted

## 2016-12-19 VITALS — BP 128/60 | HR 54 | Temp 98.5°F | Ht 73.0 in | Wt 223.6 lb

## 2016-12-19 DIAGNOSIS — Z0000 Encounter for general adult medical examination without abnormal findings: Secondary | ICD-10-CM

## 2016-12-19 NOTE — Progress Notes (Signed)
Subjective:   Andrew [name] is a 61 y.o. male who presents for an Initial Medicare Annual Wellness Visit.  Cardiac Risk Factors include: advanced age (>60mn, >>65women);diabetes mellitus;dyslipidemia;hypertension;male gender    Objective:    Today's Vitals   12/19/16 1336 12/19/16 1337  BP:  128/60  Pulse:  (!) 54  Temp:  98.5 F (36.9 C)  TempSrc:  Oral  SpO2:  99%  Weight: 223 lb 9.6 oz (101.4 kg)   Height: '6\' 1"'  (1.854 m)   PainSc:  2    Body mass index is 29.5 kg/m.  Current Medications (verified) Outpatient Encounter Prescriptions as of 12/19/2016  Medication Sig  . allopurinol (ZYLOPRIM) 100 MG tablet Take 1 tablet (100 mg total) by mouth daily.  .Marland KitchenamLODipine (NORVASC) 10 MG tablet Take 1 tablet (10 mg total) by mouth daily.  .Marland Kitchenaspirin 81 MG chewable tablet CHEW AND SWALLOW 1 TABLET EVERY DAY.  .Marland KitchenBlood Glucose Monitoring Suppl (BLOOD GLUCOSE METER) kit Use to test fasting glucose daily.  Diagnosis Type II diabetes 250.0  . enalapril (VASOTEC) 20 MG tablet Take 2 tablets (40 mg total) by mouth daily.  .Marland Kitchenlevothyroxine (SYNTHROID, LEVOTHROID) 75 MCG tablet Take 1 tablet (75 mcg total) by mouth daily.  .Marland Kitchenlovastatin (MEVACOR) 40 MG tablet Take 1 tablet (40 mg total) by mouth at bedtime.  . pioglitazone (ACTOS) 30 MG tablet Take 1 tablet (30 mg total) by mouth daily.  .Marland KitchenPRODIGY LANCETS 26G MISC Use to check fasting blood glucose each morning.  Diagnosis: Diabetes Mellitus Type II 250.00  . spironolactone (ALDACTONE) 25 MG tablet   . traMADol (ULTRAM) 50 MG tablet Take 1 tablet (50 mg total) by mouth every 8 (eight) hours as needed.  . traZODone (DESYREL) 100 MG tablet TAKE 1 TABLET AT BEDTIME  . TRUE METRIX BLOOD GLUCOSE TEST test strip USE TO TEST BLOOD SUGAR TWICE DAILY FOR TYPE II  DIABETES  . mupirocin ointment (BACTROBAN) 2 % 1 application to the right great toe twice daily (Patient not taking: Reported on 12/19/2016)  . triamcinolone cream (KENALOG) 0.1 % Apply  1 application topically 2 (two) times daily. (Patient not taking: Reported on 12/19/2016)   No facility-administered encounter medications on file as of 12/19/2016.     Allergies (verified) Metoprolol succinate and Penicillins   History: Past Medical History:  Diagnosis Date  . Arthritis   . CRD (chronic renal disease), stage II    Baseline Cr 1.2  . CVA (cerebral vascular accident) ([facility]    x4 last one in 2007, R sided residual weakness  . HAMMER TOE 07/25/2010  . HLD (hyperlipidemia)   . HTN (hypertension)   . Leg swelling   . Lipoma of back 03/18/2011  . Personal history of colonic adenomas 04/01/2013  . Prostatitis    hx of x2  . PSEUDOFOLLICULITIS BARBAE 97/11/7970  Qualifier: Diagnosis of  By: GDanise Mina MD, JGarlon Hatchet   . Sarcoidosis   . T2DM (type 2 diabetes mellitus) ([facility]    Past Surgical History:  Procedure Laterality Date  . ERCP  2006   Normal  . HVolin  x2 'inguinal  . LIPOMA EXCISION  06/22/2011   Procedure: EXCISION LIPOMA;  Surgeon: MRolm Bookbinder MD;  [facility]  Service: General;  Laterality: N/A;  . TRANSTHORACIC ECHOCARDIOGRAM  2005   EF 60%   Family History  Problem Relation Age of Onset  . Bone cancer Father   .  Cancer Father 65       bone  . Uterine cancer Mother   . Cancer Mother   . Heart attack Brother   . Kidney disease Brother   . Diabetes Brother   . Arthritis Brother   . Heart disease Sister   . Congestive Heart Failure Sister   . Congestive Heart Failure Sister   . Congestive Heart Failure Sister   . Diabetes Sister   . Arthritis Sister   . Colon cancer Neg Hx   . Rectal cancer Neg Hx   . Stomach cancer Neg Hx    Social History   Occupational History  . Not on file.   Social History Main Topics  . Smoking status: Never Smoker  . Smokeless tobacco: Never Used  . Alcohol use 0.6 oz/week    1 Glasses of wine per week     Comment: 1 glass of wine 2 X per month  . Drug use: No    . Sexual activity: Not Currently   Tobacco Counseling Patient has never smoked and has no plans to start.  Activities of Daily Living In your present state of health, do you have any difficulty performing the following activities: 12/19/2016  Hearing? N  Vision? N  Difficulty concentrating or making decisions? N  Walking or climbing stairs? Y  Dressing or bathing? N  Doing errands, shopping? N  Preparing Food and eating ? N  Using the Toilet? N  In the past six months, have you accidently leaked urine? N  Do you have problems with loss of bowel control? N  Managing your Medications? N  Managing your Finances? N  Housekeeping or managing your Housekeeping? N  Some recent data might be hidden   Home Safety:  My home has a working smoke alarm:  No. Advised patient to contact local Fire Dept to have installed           My home throw rugs have been fastened down to the floor or removed:  Has several that are tacked down but one he will remove when he arrives home I have a non-slip surface or non-slip mats in the bathtub and shower:  Yes        All my home's stairs have handrails, including any outdoor stairs  One level home with 3 outside steps with handrails          My home's floors, stairs and hallways are free from clutter, wires and cords:  Yes     I have animals in my home  Yes, one small dog I wear seatbelts consistently:  Yes   Immunizations and Health Maintenance Immunization History  Administered Date(s) Administered  . Influenza Split 04/30/2011, 03/21/2012  . Influenza Whole 05/12/2007, 04/28/2009, 07/25/2010  . Influenza,inj,Quad PF,36+ Mos 03/20/2013, 04/14/2014, 03/24/2015, 04/03/2016  . Pneumococcal Conjugate-13 05/10/2015  . Pneumococcal Polysaccharide-23 04/14/2014  . Td 05/09/2004  . Zoster 04/30/2016   Health Maintenance Due  Topic Date Due  . OPHTHALMOLOGY EXAM  11/20/2016  Patient had recent laser surgery to remove cataracts bilaterally. Will send for  records.  Patient Care Team: Archie Patten, MD as PCP - General (Family Medicine) Rutherford Guys, MD as Consulting Physician (Ophthalmology) Gatha Mayer, MD as Consulting Physician (Gastroenterology)  Indicate any recent Medical Services you may have received from other than [facility] providers in the past year (date may be approximate).    Assessment:   This is a routine wellness examination for Andrew [name].   Hearing/Vision screen  Hearing  Screening   Method: Audiometry   '125Hz'  '250Hz'  '500Hz'  '1000Hz'  '2000Hz'  '3000Hz'  '4000Hz'  '6000Hz'  '8000Hz'   Right ear:   40 40 40  40    Left ear:   40 40 40  40      Dietary issues and exercise activities discussed: Current Exercise Habits: Home exercise routine, Type of exercise: walking, Time (Minutes): 30, Frequency (Times/Week): 6, Weekly Exercise (Minutes/Week): 180, Intensity: Mild, Exercise limited by: orthopedic condition(s) (Low back and bilat knee pain 2/2 arthritis)  Goals    . Blood Pressure < 130/80    . Having bone spur fixed on left foot    . HEMOGLOBIN A1C < 6.5    . Weight (lb) < 200 lb (90.7 kg) (pt-stated)     Patient also wants to begin Tai Chi at [facility] downtime through [place] to help with fall prevention  Depression Screen PHQ 2/9 Scores 12/19/2016 07/31/2016 05/15/2016 12/26/2015  PHQ - 2 Score 0 0 0 0  PHQ- 9 Score - - 0 -  Exception Documentation - - - -    Fall Risk Fall Risk  12/19/2016 06/29/2015 11/15/2014 12/16/2013  Falls in the past year? No No No Yes  Number falls in past yr: - - - 1  Injury with Fall? - - - No  Risk for fall due to : - - Other (Comment) Impaired balance/gait   TUG Test:  Done in 22 seconds. Patient used one hand to push out of chair and to sit back down. Falls prevention discussed in detail and literature given.  Cognitive Function: Mini-Cog  Failed with score 2/5  Screening Tests Health Maintenance  Topic Date Due  . OPHTHALMOLOGY EXAM  11/20/2016  . INFLUENZA VACCINE  02/06/2017  .  HEMOGLOBIN A1C  04/09/2017  . FOOT EXAM  05/15/2017  . COLONOSCOPY  03/24/2018  . PNEUMOCOCCAL POLYSACCHARIDE VACCINE (2) 04/15/2019  . TETANUS/TDAP  04/14/2024  . Hepatitis C Screening  Completed  . HIV Screening  Completed        Plan:     I have personally reviewed and noted the following in the patient's chart:   . Medical and social history . Use of alcohol, tobacco or illicit drugs  . Current medications and supplements . Functional ability and status . Nutritional status . Physical activity . Advanced directives . List of other physicians . Hospitalizations, surgeries, and ER visits in previous 12 months . Vitals . Screenings to include cognitive, depression, and falls . Referrals and appointments  In addition, I have reviewed and discussed with patient certain preventive protocols, quality metrics, and best practice recommendations. A written personalized care plan for preventive services as well as general preventive health recommendations were provided to patient.     Velora Heckler, RN   12/19/2016

## 2016-12-19 NOTE — Telephone Encounter (Signed)
Disability Parking Placard form dropped off for at front desk for completion.  Verified that patient section of form has been completed.  Last DOS/WCC with PCP was 10/08/16  Placed form in red team folder to be completed by clinical staff.  Andrew [name]

## 2016-12-19 NOTE — Telephone Encounter (Signed)
Form placed in PCP box 

## 2016-12-19 NOTE — Patient Instructions (Addendum)
Andrew [name],  Thank you for taking time to come for yourMedicare Wellness Visit. I appreciate your ongoing commitment to your health goals. Please review the following plan we discussed and let me know if I can assist you in the future.   These are the goals we discussed:  Goals    . Blood Pressure < 130/80    . Having bone spur fixed on left foot    . HEMOGLOBIN A1C < 6.5    . Weight (lb) < 200 lb (90.7 kg) (pt-stated)          Diabetes and Foot Care Diabetes may cause you to have problems because of poor blood supply (circulation) to your feet and legs. This may cause the skin on your feet to become thinner, break easier, and heal more slowly. Your skin may become dry, and the skin may peel and crack. You may also have nerve damage in your legs and feet causing decreased feeling in them. You may not notice minor injuries to your feet that could lead to infections or more serious problems. Taking care of your feet is one of the most important things you can do for yourself. Follow these instructions at home:  Wear shoes at all times, even in the house. Do not go barefoot. Bare feet are easily injured.  Check your feet daily for blisters, cuts, and redness. If you cannot see the bottom of your feet, use a mirror or ask someone for help.  Wash your feet with warm water (do not use hot water) and mild soap. Then pat your feet and the areas between your toes until they are completely dry. Do not soak your feet as this can dry your skin.  Apply a moisturizing lotion or petroleum jelly (that does not contain alcohol and is unscented) to the skin on your feet and to dry, brittle toenails. Do not apply lotion between your toes.  Trim your toenails straight across. Do not dig under them or around the cuticle. File the edges of your nails with an emery board or nail file.  Do not cut corns or calluses or try to remove them with medicine.  Wear clean socks or stockings every day. Make sure  they are not too tight. Do not wear knee-high stockings since they may decrease blood flow to your legs.  Wear shoes that fit properly and have enough cushioning. To break in new shoes, wear them for just a few hours a day. This prevents you from injuring your feet. Always look in your shoes before you put them on to be sure there are no objects inside.  Do not cross your legs. This may decrease the blood flow to your feet.  If you find a minor scrape, cut, or break in the skin on your feet, keep it and the skin around it clean and dry. These areas may be cleansed with mild soap and water. Do not cleanse the area with peroxide, alcohol, or iodine.  When you remove an adhesive bandage, be sure not to damage the skin around it.  If you have a wound, look at it several times a day to make sure it is healing.  Do not use heating pads or hot water bottles. They may burn your skin. If you have lost feeling in your feet or legs, you may not know it is happening until it is too late.  Make sure your health care provider performs a complete foot exam at least annually or more often  if you have foot problems. Report any cuts, sores, or bruises to your health care provider immediately. Contact a health care provider if:  You have an injury that is not healing.  You have cuts or breaks in the skin.  You have an ingrown nail.  You notice redness on your legs or feet.  You feel burning or tingling in your legs or feet.  You have pain or cramps in your legs and feet.  Your legs or feet are numb.  Your feet always feel cold. Get help right away if:  There is increasing redness, swelling, or pain in or around a wound.  There is a red line that goes up your leg.  Pus is coming from a wound.  You develop a fever or as directed by your health care provider.  You notice a bad smell coming from an ulcer or wound. This information is not intended to replace advice given to you by your health care  provider. Make sure you discuss any questions you have with your health care provider. Document Released: 06/22/2000 Document Revised: 12/01/2015 Document Reviewed: 12/02/2012 Elsevier Interactive Patient Education  2017 [place] Prevention in the Home Falls can cause injuries. They can happen to people of all ages. There are many things you can do to make your home safe and to help prevent falls. What can I do on the outside of my home?  Regularly fix the edges of walkways and driveways and fix any cracks.  Remove anything that might make you trip as you walk through a door, such as a raised step or threshold.  Trim any bushes or trees on the path to your home.  Use bright outdoor lighting.  Clear any walking paths of anything that might make someone trip, such as rocks or tools.  Regularly check to see if handrails are loose or broken. Make sure that both sides of any steps have handrails.  Any raised decks and porches should have guardrails on the edges.  Have any leaves, snow, or ice cleared regularly.  Use sand or salt on walking paths during winter.  Clean up any spills in your garage right away. This includes oil or grease spills. What can I do in the bathroom?  Use night lights.  Install grab bars by the toilet and in the tub and shower. Do not use towel bars as grab bars.  Use non-skid mats or decals in the tub or shower.  If you need to sit down in the shower, use a plastic, non-slip stool.  Keep the floor dry. Clean up any water that spills on the floor as soon as it happens.  Remove soap buildup in the tub or shower regularly.  Attach bath mats securely with double-sided non-slip rug tape.  Do not have throw rugs and other things on the floor that can make you trip. What can I do in the bedroom?  Use night lights.  Make sure that you have a light by your bed that is easy to reach.  Do not use any sheets or blankets that are too big for your  bed. They should not hang down onto the floor.  Have a firm chair that has side arms. You can use this for support while you get dressed.  Do not have throw rugs and other things on the floor that can make you trip. What can I do in the kitchen?  Clean up any spills right away.  Avoid walking on  wet floors.  Keep items that you use a lot in easy-to-reach places.  If you need to reach something above you, use a strong step stool that has a grab bar.  Keep electrical cords out of the way.  Do not use floor polish or wax that makes floors slippery. If you must use wax, use non-skid floor wax.  Do not have throw rugs and other things on the floor that can make you trip. What can I do with my stairs?  Do not leave any items on the stairs.  Make sure that there are handrails on both sides of the stairs and use them. Fix handrails that are broken or loose. Make sure that handrails are as long as the stairways.  Check any carpeting to make sure that it is firmly attached to the stairs. Fix any carpet that is loose or worn.  Avoid having throw rugs at the top or bottom of the stairs. If you do have throw rugs, attach them to the floor with carpet tape.  Make sure that you have a light switch at the top of the stairs and the bottom of the stairs. If you do not have them, ask someone to add them for you. What else can I do to help prevent falls?  Wear shoes that: ? Do not have high heels. ? Have rubber bottoms. ? Are comfortable and fit you well. ? Are closed at the toe. Do not wear sandals.  If you use a stepladder: ? Make sure that it is fully opened. Do not climb a closed stepladder. ? Make sure that both sides of the stepladder are locked into place. ? Ask someone to hold it for you, if possible.  Clearly mark and make sure that you can see: ? Any grab bars or handrails. ? First and last steps. ? Where the edge of each step is.  Use tools that help you move around (mobility  aids) if they are needed. These include: ? Canes. ? Walkers. ? Scooters. ? Crutches.  Turn on the lights when you go into a dark area. Replace any light bulbs as soon as they burn out.  Set up your furniture so you have a clear path. Avoid moving your furniture around.  If any of your floors are uneven, fix them.  If there are any pets around you, be aware of where they are.  Review your medicines with your doctor. Some medicines can make you feel dizzy. This can increase your chance of falling. Ask your doctor what other things that you can do to help prevent falls. This information is not intended to replace advice given to you by your health care provider. Make sure you discuss any questions you have with your health care provider. Document Released: 04/21/2009 Document Revised: 12/01/2015 Document Reviewed: 07/30/2014 Elsevier Interactive Patient Education  2018 [place] Maintenance, Male A healthy lifestyle and preventive care is important for your health and wellness. Ask your health care provider about what schedule of regular examinations is right for you. What should I know about weight and diet? Eat a Healthy Diet  Eat plenty of vegetables, fruits, whole grains, low-fat dairy products, and lean protein.  Do not eat a lot of foods high in solid fats, added sugars, or salt.  Maintain a Healthy Weight Regular exercise can help you achieve or maintain a healthy weight. You should:  Do at least 150 minutes of exercise each week. The exercise should increase your heart  rate and make you sweat (moderate-intensity exercise).  Do strength-training exercises at least twice a week.  Watch Your Levels of Cholesterol and Blood Lipids  Have your blood tested for lipids and cholesterol every 5 years starting at 61 years of age. If you are at high risk for heart disease, you should start having your blood tested when you are 61 years old. You may need to have your  cholesterol levels checked more often if: ? Your lipid or cholesterol levels are high. ? You are older than 61 years of age. ? You are at high risk for heart disease.  What should I know about cancer screening? Many types of cancers can be detected early and may often be prevented. Lung Cancer  You should be screened every year for lung cancer if: ? You are a current smoker who has smoked for at least 30 years. ? You are a former smoker who has quit within the past 15 years.  Talk to your health care provider about your screening options, when you should start screening, and how often you should be screened.  Colorectal Cancer  Routine colorectal cancer screening usually begins at 61 years of age and should be repeated every 5-10 years until you are 61 years old. You may need to be screened more often if early forms of precancerous polyps or small growths are found. Your health care provider may recommend screening at an earlier age if you have risk factors for colon cancer.  Your health care provider may recommend using home test kits to check for hidden blood in the stool.  A small camera at the end of a tube can be used to examine your colon (sigmoidoscopy or colonoscopy). This checks for the earliest forms of colorectal cancer.  Prostate and Testicular Cancer  Depending on your age and overall health, your health care provider may do certain tests to screen for prostate and testicular cancer.  Talk to your health care provider about any symptoms or concerns you have about testicular or prostate cancer.  Skin Cancer  Check your skin from head to toe regularly.  Tell your health care provider about any new moles or changes in moles, especially if: ? There is a change in a mole's size, shape, or color. ? You have a mole that is larger than a pencil eraser.  Always use sunscreen. Apply sunscreen liberally and repeat throughout the day.  Protect yourself by wearing long sleeves,  pants, a wide-brimmed hat, and sunglasses when outside.  What should I know about heart disease, diabetes, and high blood pressure?  If you are 34-49 years of age, have your blood pressure checked every 3-5 years. If you are 54 years of age or older, have your blood pressure checked every year. You should have your blood pressure measured twice-once when you are at a hospital or clinic, and once when you are not at a hospital or clinic. Record the average of the two measurements. To check your blood pressure when you are not at a hospital or clinic, you can use: ? An automated blood pressure machine at a pharmacy. ? A home blood pressure monitor.  Talk to your health care provider about your target blood pressure.  If you are between 59-85 years old, ask your health care provider if you should take aspirin to prevent heart disease.  Have regular diabetes screenings by checking your fasting blood sugar level. ? If you are at a normal weight and have a low risk  for diabetes, have this test once every three years after the age of 7. ? If you are overweight and have a high risk for diabetes, consider being tested at a younger age or more often.  A one-time screening for abdominal aortic aneurysm (AAA) by ultrasound is recommended for men aged 73-75 years who are current or former smokers. What should I know about preventing infection? Hepatitis B If you have a higher risk for hepatitis B, you should be screened for this virus. Talk with your health care provider to find out if you are at risk for hepatitis B infection. Hepatitis C Blood testing is recommended for:  Everyone born from [birth year] through 1965.  Anyone with known risk factors for hepatitis C.  Sexually Transmitted Diseases (STDs)  You should be screened each year for STDs including gonorrhea and chlamydia if: ? You are sexually active and are younger than 61 years of age. ? You are older than 61 years of age and your health care  provider tells you that you are at risk for this type of infection. ? Your sexual activity has changed since you were last screened and you are at an increased risk for chlamydia or gonorrhea. Ask your health care provider if you are at risk.  Talk with your health care provider about whether you are at high risk of being infected with HIV. Your health care provider may recommend a prescription medicine to help prevent HIV infection.  What else can I do?  Schedule regular health, dental, and eye exams.  Stay current with your vaccines (immunizations).  Do not use any tobacco products, such as cigarettes, chewing tobacco, and e-cigarettes. If you need help quitting, ask your health care provider.  Limit alcohol intake to no more than 2 drinks per day. One drink equals 12 ounces of beer, 5 ounces of wine, or 1 ounces of hard liquor.  Do not use street drugs.  Do not share needles.  Ask your health care provider for help if you need support or information about quitting drugs.  Tell your health care provider if you often feel depressed.  Tell your health care provider if you have ever been abused or do not feel safe at home. This information is not intended to replace advice given to you by your health care provider. Make sure you discuss any questions you have with your health care provider. Document Released: 12/22/2007 Document Revised: 02/22/2016 Document Reviewed: 03/29/2015 Elsevier Interactive Patient Education  [employer].

## 2016-12-20 NOTE — Telephone Encounter (Signed)
Patient informed that placard form is complete and ready for pick up.  Mohan Erven L, RN  

## 2016-12-20 NOTE — Telephone Encounter (Signed)
Form completed, given to [place].  Archie Patten, MD [facility] Family Medicine Resident  12/20/2016, 9:04 AM

## 2016-12-25 NOTE — Progress Notes (Signed)
I have reviewed this visit and discussed with Howell Rucks, RN, BSN, and agree with her documentation  Of note: Failed mini-cog with score 2/5  TUG done in 22 seconds; no falls in last year.   Patient always seems very cognizant and knowledgeable during our encounters. To future PCP: May benefit from referral to geriatrics clinic for further cognitive testing. As far as timed up and go- pt with pain secondary to OA of his knees bilaterally which may have contributed.   Archie Patten, MD [facility] Family Medicine Resident  12/25/2016, 10:52 AM

## 2016-12-26 ENCOUNTER — Other Ambulatory Visit: Payer: Self-pay | Admitting: Family Medicine

## 2016-12-31 ENCOUNTER — Ambulatory Visit (INDEPENDENT_AMBULATORY_CARE_PROVIDER_SITE_OTHER): Payer: Medicare HMO | Admitting: Podiatry

## 2016-12-31 ENCOUNTER — Ambulatory Visit (INDEPENDENT_AMBULATORY_CARE_PROVIDER_SITE_OTHER): Payer: Medicare HMO

## 2016-12-31 DIAGNOSIS — M76822 Posterior tibial tendinitis, left leg: Secondary | ICD-10-CM

## 2016-12-31 DIAGNOSIS — M2042 Other hammer toe(s) (acquired), left foot: Secondary | ICD-10-CM | POA: Diagnosis not present

## 2016-12-31 DIAGNOSIS — M79672 Pain in left foot: Secondary | ICD-10-CM

## 2016-12-31 DIAGNOSIS — M21619 Bunion of unspecified foot: Secondary | ICD-10-CM

## 2016-12-31 DIAGNOSIS — Q828 Other specified congenital malformations of skin: Secondary | ICD-10-CM
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
## 2017-01-04 MED ORDER — BETAMETHASONE SOD PHOS & ACET 6 (3-3) MG/ML IJ SUSP: 3.0000 mg | Freq: Once | INTRAMUSCULAR | Status: DC

## 2017-01-10 ENCOUNTER — Ambulatory Visit: Payer: Medicare HMO | Admitting: Internal Medicine

## 2017-01-10 NOTE — Progress Notes (Deleted)
   [facility] Kerrin Mo, MD Phone: [phone]  Reason For Visit:   # *** -   Past Medical History Reviewed problem list.  Medications- reviewed and updated No additions to family history Social history- patient is a *** smoker  Objective: There were no vitals taken for this visit. Gen: NAD, alert, cooperative with exam HEENT: Normal    Neck: No masses palpated. No lymphadenopathy    Ears: Tympanic membranes intact, normal light reflex, no erythema, no bulging    Eyes: PERRLA, EOMI    Nose: nasal turbinates moist    Throat: moist mucus membranes, no erythema Cardio: regular rate and rhythm, S1S2 heard, no murmurs appreciated Pulm: clear to auscultation bilaterally, no wheezes, rhonchi or rales GI: soft, non-tender, non-distended, bowel sounds present, no hepatomegaly, no splenomegaly GU: external vaginal tissue ***, cervix ***, *** punctate lesions on cervix appreciated, *** discharge from cervical os, *** bleeding, *** cervical motion tenderness, *** abdominal/ adnexal masses Extremities: warm, well perfused, No edema, cyanosis or clubbing;  MSK: Normal gait and station Skin: dry, intact, no rashes or lesions Neuro: Strength and sensation grossly intact   Assessment/Plan: See problem based a/p  No problem-specific Assessment & Plan notes found for this encounter.

## 2017-01-14 DIAGNOSIS — H524 Presbyopia: Secondary | ICD-10-CM | POA: Diagnosis not present

## 2017-01-14 DIAGNOSIS — Z0100 Encounter for examination of eyes and vision without abnormal findings: Secondary | ICD-10-CM | POA: Diagnosis not present

## 2017-01-15 ENCOUNTER — Other Ambulatory Visit: Payer: Self-pay | Admitting: Family Medicine

## 2017-01-24 ENCOUNTER — Ambulatory Visit (INDEPENDENT_AMBULATORY_CARE_PROVIDER_SITE_OTHER): Payer: Medicare HMO | Admitting: Internal Medicine

## 2017-01-24 ENCOUNTER — Ambulatory Visit (HOSPITAL_COMMUNITY)
Admission: RE | Admit: 2017-01-24 | Discharge: 2017-01-24 | Disposition: A | Discharge status: Home / Self Care | Payer: Medicare HMO | Source: Ambulatory Visit | Attending: Family Medicine | Admitting: Family Medicine

## 2017-01-24 ENCOUNTER — Encounter: Payer: Self-pay | Admitting: Internal Medicine

## 2017-01-24 VITALS — BP 130/68 | HR 66 | Temp 98.6°F | Ht 73.0 in | Wt 220.6 lb

## 2017-01-24 DIAGNOSIS — G8929 Other chronic pain: Secondary | ICD-10-CM

## 2017-01-24 DIAGNOSIS — I739 Peripheral vascular disease, unspecified: Secondary | ICD-10-CM | POA: Insufficient documentation

## 2017-01-24 DIAGNOSIS — M170 Bilateral primary osteoarthritis of knee: Secondary | ICD-10-CM | POA: Diagnosis not present

## 2017-01-24 DIAGNOSIS — M1711 Unilateral primary osteoarthritis, right knee: Secondary | ICD-10-CM | POA: Diagnosis not present

## 2017-01-24 DIAGNOSIS — M8588 Other specified disorders of bone density and structure, other site: Secondary | ICD-10-CM | POA: Insufficient documentation

## 2017-01-24 DIAGNOSIS — M25562 Pain in left knee: Secondary | ICD-10-CM | POA: Diagnosis not present

## 2017-01-24 DIAGNOSIS — M1712 Unilateral primary osteoarthritis, left knee: Secondary | ICD-10-CM | POA: Diagnosis not present

## 2017-01-24 DIAGNOSIS — M25561 Pain in right knee: Secondary | ICD-10-CM

## 2017-01-24 DIAGNOSIS — E1122 Type 2 diabetes mellitus with diabetic chronic kidney disease: Secondary | ICD-10-CM | POA: Diagnosis not present

## 2017-01-24 LAB — POCT GLYCOSYLATED HEMOGLOBIN (HGB A1C): Hemoglobin A1C: 7

## 2017-01-24 MED ORDER — ALLOPURINOL 100 MG PO TABS
100.0000 mg | ORAL_TABLET | Freq: Every day | ORAL | 3 refills | Status: DC
Start: 2017-01-24 — End: 2017-01-28

## 2017-01-24 MED ORDER — ASPIRIN 81 MG PO CHEW: CHEWABLE_TABLET | ORAL | 3 refills | Status: DC

## 2017-01-24 MED ORDER — CYCLOBENZAPRINE HCL 10 MG PO TABS: 10.0000 mg | ORAL_TABLET | Freq: Three times a day (TID) | ORAL | 2 refills | Status: DC | PRN

## 2017-01-24 MED ORDER — LEVOTHYROXINE SODIUM 75 MCG PO TABS: 75.0000 ug | ORAL_TABLET | Freq: Every day | ORAL | 1 refills | Status: DC

## 2017-01-24 MED ORDER — AMLODIPINE BESYLATE 10 MG PO TABS: 10.0000 mg | ORAL_TABLET | Freq: Every day | ORAL | 1 refills | Status: DC

## 2017-01-24 MED ORDER — PIOGLITAZONE HCL 30 MG PO TABS: 30.0000 mg | ORAL_TABLET | Freq: Every day | ORAL | 3 refills | Status: DC

## 2017-01-24 MED ORDER — TRAZODONE HCL 100 MG PO TABS: 100.0000 mg | ORAL_TABLET | Freq: Every day | ORAL | 1 refills | Status: DC

## 2017-01-24 MED ORDER — ENALAPRIL MALEATE 20 MG PO TABS: 40.0000 mg | ORAL_TABLET | Freq: Every day | ORAL | 1 refills | Status: DC

## 2017-01-24 NOTE — Progress Notes (Signed)
[facility] Progress Note  Subjective:  Andrew [name] is a 61 y.o. male with history of arthritis, HTN, HLD, T2DM, pulmonary sarcoidosis, CVA and early CKD who presents for follow-up of diabetes and complaint of knee pain.  #T2DM: - Taking pioglitazone 30 mg daily. Metformin stopped previously due to worsening kidney function. Could not tolerate glipizide due to hypoglycemia symptoms.  - Limits carbohydrates. Tries to eat lots of protein and vegetables.  - Checks CBGs about twice daily. Highest number he remembers is 200 after eating something sweet. Usually no higher than 150/160.  - Recently saw his eye doctor, Dr. Gershon Crane.  ROS: No abdominal pain, increased urinary frequency  #Knee pain: - Both knees involved, right maybe worse than left. Ongoing for years. - Went to a conference last weekend and had difficulty using stairs due to pain and felt very slowed down.  - He is wondering if he might need knee replacements. His brother had both replaced. - Does feel like his knees lock up at times. Has noticed swelling of his knees and legs that improves with elevation.  - Unable to participate in regular physical activity due to his knees.  - Had steroid injections of both knees in 2017 ROS: No recent falls, no numbness or tingling down his legs  Allergies  Allergen Reactions  . Metoprolol Succinate     REACTION: bradycardia (HR to 42)  . Penicillins Itching    Has patient had a PCN reaction causing immediate rash, facial/tongue/throat swelling, SOB or lightheadedness with hypotension: No Has patient had a PCN reaction causing severe rash involving mucus membranes or skin necrosis: No Has patient had a PCN reaction that required hospitalization No Has patient had a PCN reaction occurring within the last 10 years: No If all of the above answers are "NO", then may proceed with Cephalosporin use.   Social: Former smoker  Objective: Blood pressure 130/68, pulse 66,  temperature 98.6 F (37 C), temperature source Oral, height 6\' 1"  (1.854 m), weight 220 lb 9.6 oz (100.1 kg), SpO2 98 %. Body mass index is 29.1 kg/m. Constitutional: Overweight male, in NAD Cardiovascular: RRR, S1, S2, no m/r/g.  Pulmonary/Chest: Effort normal and breath sounds normal. No respiratory distress.  Musculoskeletal: Knees without obvious effusion bilaterally. Bilateral crepitus. Pain with downward pressure on patella on L. No joint line TTP. LE strength 5/5 bilaterally. Negative anterior and posterior drawer testing.  Skin: Skin is warm and dry. No erythema.  Psychiatric: Normal mood and affect.  Vitals reviewed  SCr 10/08/16 was 1.62. GFR 53. SCr 05/15/16 1.60.   L knee x-ray : Mild to moderate OA and chronic depressed fracture of lateral tibial plateau. R knee x-ray: Degenerative change. Mild narrowing of patellofemoral space and medial joint space.   Assessment/Plan: DM (diabetes mellitus), type 2 with renal complications - Hgb Y0D stable at 7.0. Continue actos. - Will request ophthalmology records  Knee pain, bilateral - Exam consistent with worsening OA. - Will order xrays of both knees to look for progression. Will refer to Surgery pending these results. - Suggested trying capsaicin in addition to tylenol.   Will refill medications (amlodipine, pioglitazone, allopurinol, enalapril, aspirin, levothyroxine, trazodone, lovastatin) through mail order pharmacy (Physician Orders line: [phone] or fax [phone]).   Follow-up in about 3 months to check on knee pain.  Olene Floss, MD [place], PGY-3

## 2017-01-24 NOTE — Patient Instructions (Signed)
Andrew [name],  I will call your medications into your mail-order pharmacy. The muscle relaxant will be at [facility].  Please go to [facility] for knee imaging. I will call you with the results.   Please see me back in about 3 months to see how things are going with your knees. Please try capsaicin cream for pain relief and continue tylenol as needed.  Best, Dr. Ola Spurr

## 2017-01-25 MED ORDER — PIOGLITAZONE HCL 30 MG PO TABS: 30.0000 mg | ORAL_TABLET | Freq: Every day | ORAL | 3 refills | Status: DC

## 2017-01-25 MED ORDER — LOVASTATIN 40 MG PO TABS: 40.0000 mg | ORAL_TABLET | Freq: Every day | ORAL | 3 refills | Status: DC

## 2017-01-25 MED ORDER — LEVOTHYROXINE SODIUM 75 MCG PO TABS: 75.0000 ug | ORAL_TABLET | Freq: Every day | ORAL | 3 refills | Status: DC

## 2017-01-25 MED ORDER — ALLOPURINOL 100 MG PO TABS: 100.0000 mg | ORAL_TABLET | Freq: Every day | ORAL | 3 refills | Status: DC

## 2017-01-25 MED ORDER — ASPIRIN 81 MG PO CHEW: CHEWABLE_TABLET | ORAL | 3 refills | Status: DC

## 2017-01-25 MED ORDER — ENALAPRIL MALEATE 20 MG PO TABS: 40.0000 mg | ORAL_TABLET | Freq: Every day | ORAL | 3 refills | Status: DC

## 2017-01-25 MED ORDER — AMLODIPINE BESYLATE 10 MG PO TABS: 10.0000 mg | ORAL_TABLET | Freq: Every day | ORAL | 3 refills | Status: DC

## 2017-01-25 MED ORDER — TRAZODONE HCL 100 MG PO TABS: 100.0000 mg | ORAL_TABLET | Freq: Every day | ORAL | 3 refills | Status: DC

## 2017-01-27 NOTE — Assessment & Plan Note (Signed)
-   Exam consistent with worsening OA. - Will order xrays of both knees to look for progression. Will refer to Surgery pending these results. - Suggested trying capsaicin in addition to tylenol.

## 2017-01-27 NOTE — Assessment & Plan Note (Signed)
-   Hgb A1c stable at 7.0. Continue actos. - Will request ophthalmology records

## 2017-01-28 ENCOUNTER — Telehealth: Payer: Self-pay | Admitting: Internal Medicine

## 2017-01-28 ENCOUNTER — Other Ambulatory Visit: Payer: Self-pay | Admitting: Internal Medicine

## 2017-01-28 ENCOUNTER — Ambulatory Visit: Payer: Medicare HMO | Admitting: Podiatry

## 2017-01-28 DIAGNOSIS — M170 Bilateral primary osteoarthritis of knee: Secondary | ICD-10-CM

## 2017-01-28 MED ORDER — ENALAPRIL MALEATE 20 MG PO TABS: 40.0000 mg | ORAL_TABLET | Freq: Every day | ORAL | 3 refills | Status: DC

## 2017-01-28 MED ORDER — GLUCOSE BLOOD VI STRP: ORAL_STRIP | 5 refills | Status: DC

## 2017-01-28 MED ORDER — TRAZODONE HCL 100 MG PO TABS: 100.0000 mg | ORAL_TABLET | Freq: Every day | ORAL | 3 refills | Status: DC

## 2017-01-28 MED ORDER — LEVOTHYROXINE SODIUM 75 MCG PO TABS: 75.0000 ug | ORAL_TABLET | Freq: Every day | ORAL | 3 refills | Status: DC

## 2017-01-28 MED ORDER — PIOGLITAZONE HCL 30 MG PO TABS: 30.0000 mg | ORAL_TABLET | Freq: Every day | ORAL | 3 refills | Status: DC

## 2017-01-28 MED ORDER — AMLODIPINE BESYLATE 10 MG PO TABS: 10.0000 mg | ORAL_TABLET | Freq: Every day | ORAL | 3 refills | Status: DC

## 2017-01-28 MED ORDER — ASPIRIN 81 MG PO CHEW: CHEWABLE_TABLET | ORAL | 3 refills | Status: DC

## 2017-01-28 MED ORDER — ALLOPURINOL 100 MG PO TABS: 100.0000 mg | ORAL_TABLET | Freq: Every day | ORAL | 3 refills | Status: DC

## 2017-01-28 MED ORDER — LOVASTATIN 40 MG PO TABS: 40.0000 mg | ORAL_TABLET | Freq: Every day | ORAL | 3 refills | Status: DC

## 2017-01-28 NOTE — Telephone Encounter (Signed)
Spoke with patient about knee x-ray results. He has tricompartmental OA of right knee. He reports steroid injections last year did not help at all. He would like to proceed with Orthopedic Surgery referral. He cannot recall who he saw in the past.

## 2017-02-05 ENCOUNTER — Ambulatory Visit (INDEPENDENT_AMBULATORY_CARE_PROVIDER_SITE_OTHER): Payer: Medicare HMO | Admitting: Orthopaedic Surgery

## 2017-02-05 ENCOUNTER — Encounter (INDEPENDENT_AMBULATORY_CARE_PROVIDER_SITE_OTHER): Payer: Self-pay | Admitting: Orthopaedic Surgery

## 2017-02-05 DIAGNOSIS — M1711 Unilateral primary osteoarthritis, right knee: Secondary | ICD-10-CM

## 2017-02-05 DIAGNOSIS — M1712 Unilateral primary osteoarthritis, left knee: Secondary | ICD-10-CM

## 2017-02-05 NOTE — Progress Notes (Signed)
Office Visit Note   Patient: Andrew [name]           Date of Birth: 03/22/1956           MRN: [id number] Visit Date: 02/05/2017              Requested by: Rogue Bussing, MD [address] [place], [place] [address] PCP: Rogue Bussing, MD   Assessment & Plan: Visit Diagnoses:  1. Unilateral primary osteoarthritis, left knee   2. Unilateral primary osteoarthritis, right knee     Plan: Overall impression is advanced degenerative joint disease worst in the patellofemoral compartment. Patient would like to try viscous supplementation injections first before considering knee replacement. I think this is very reasonable. We'll bring him back for those injections pending insurance approval.  Follow-Up Instructions: Return if symptoms worsen or fail to improve.   Orders:  No orders of the defined types were placed in this encounter.  No orders of the defined types were placed in this encounter.     Procedures: No procedures performed   Clinical Data: No additional findings.   Subjective: Chief Complaint  Patient presents with  . Right Knee - Pain  . Left Knee - Pain    Patient is a 61 year old gentleman with bilateral knee pain for many years. The pain is 5 out of 10. He is significant cracking and popping and difficulty with ADLs. He feels the pain is getting worse and its worse with weather changes. He takes tramadol or extra strength Tylenol. He has chronic kidney disease. He has non-insulin-dependent diabetes. He's had previous cortisone injections with no relief.    Review of Systems  Constitutional: Negative.   All other systems reviewed and are negative.    Objective: Vital Signs: There were no vitals taken for this visit.  Physical Exam  Constitutional: He is oriented to person, place, and time. He appears well-developed and well-nourished.  HENT:  Head: Normocephalic and atraumatic.  Eyes: Pupils are equal, round, and reactive to  light.  Neck: Neck supple.  Pulmonary/Chest: Effort normal.  Abdominal: Soft.  Musculoskeletal: Normal range of motion.  Neurological: He is alert and oriented to person, place, and time.  Skin: Skin is warm.  Psychiatric: He has a normal mood and affect. His behavior is normal. Judgment and thought content normal.  Nursing note and vitals reviewed.   Ortho Exam Bilateral knee exam shows no joint effusion. He has positive patellar crepitus. Collaterals and cruciates are stable. Specialty Comments:  No specialty comments available.  Imaging: No results found.   PMFS History: Patient Active Problem List   Diagnosis Date Noted  . Left foot pain 10/22/2016  . Contracture of toe, left 10/22/2016  . Blister 08/01/2016  . Myalgia 05/16/2016  . Healthcare maintenance 04/04/2016  . Osteoarthritis of both knees 12/26/2015  . Foot lesion 03/24/2015  . Bilateral foot pain 10/26/2014  . Impairment of balance 09/02/2014  . History of stroke 09/02/2014  . De Quervain's tenosynovitis, right 11/05/2013  . Pinguecula of both eyes 09/23/2013  . Chronic hyponatremia 09/23/2013  . Hypothyroidism 05/25/2013  . Memory loss 05/21/2013  . Personal history of colonic adenomas 04/01/2013  . Joint pain 05/06/2011  . OBESITY, UNSPECIFIED 05/20/2009  . Knee pain, bilateral 04/28/2009  . LUMBAGO 03/25/2009  . PSEUDOFOLLICULITIS BARBAE 25/11/3974  . SARCOIDOSIS 02/20/2007  . Hyperlipidemia associated with type 2 diabetes mellitus ([place]) 02/20/2007  . DM (diabetes mellitus), type 2 with renal complications ([place]) 73/41/9379  . ERECTILE  DYSFUNCTION 10/18/2006  . 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 Essential hypertension 10/18/2006  . CKD (chronic kidney disease) stage 3, GFR 30-59 ml/min 09/05/2006   Past Medical History:  Diagnosis Date  . Arthritis   . CRD (chronic renal disease), stage II    Baseline Cr 1.2  . CVA (cerebral vascular accident) ([place])    x4 last one in  2007, R sided residual weakness  . HAMMER TOE 07/25/2010  . HLD (hyperlipidemia)   . HTN (hypertension)   . Leg swelling   . Lipoma of back 03/18/2011  . Personal history of colonic adenomas 04/01/2013  . Prostatitis    hx of x2  . PSEUDOFOLLICULITIS BARBAE 09/10/2874   Qualifier: Diagnosis of  By: Danise Mina  MD, Garlon Hatchet    . Sarcoidosis   . T2DM (type 2 diabetes mellitus) ([place])     Family History  Problem Relation Age of Onset  . Bone cancer Father   . Cancer Father 77       bone  . Uterine cancer Mother   . Cancer Mother   . Heart attack Brother   . Kidney disease Brother   . Diabetes Brother   . Arthritis Brother   . Heart disease Sister   . Congestive Heart Failure Sister   . Congestive Heart Failure Sister   . Congestive Heart Failure Sister   . Diabetes Sister   . Arthritis Sister   . Colon cancer Neg Hx   . Rectal cancer Neg Hx   . Stomach cancer Neg Hx     Past Surgical History:  Procedure Laterality Date  . ERCP  2006   Normal  . [place]   x2 'inguinal  . LIPOMA EXCISION  06/22/2011   Procedure: EXCISION LIPOMA;  Surgeon: Rolm Bookbinder, MD;  Location: [facility];  Service: General;  Laterality: N/A;  . TRANSTHORACIC ECHOCARDIOGRAM  2005   EF 60%   Social History   Occupational History  . Not on file.   Social History Main Topics  . Smoking status: Never Smoker  . Smokeless tobacco: Never Used  . Alcohol use 0.6 oz/week    1 Glasses of wine per week     Comment: 1 glass of wine 2 X per month  . Drug use: No  . Sexual activity: Not Currently

## 2017-02-14 IMAGING — CT CT HEAD W/O CM
2 series · 15 of 30 positions shown, 19 images · non-contrast
Comparison: MR brain of 03/15/2004 and CT brain of the same date

CLINICAL DATA: Right-sided weakness, unsteady gait, history of
diabetes

EXAM:
CT HEAD WITHOUT CONTRAST
TECHNIQUE: Contiguous axial images were obtained from the base of the skull
through the vertex without intravenous contrast.

[Series 3: head bone · axial · 0.49mm/px · z∈[+33,+54]mm · 2 of 28 slices shown]
[im 2/28  bone]
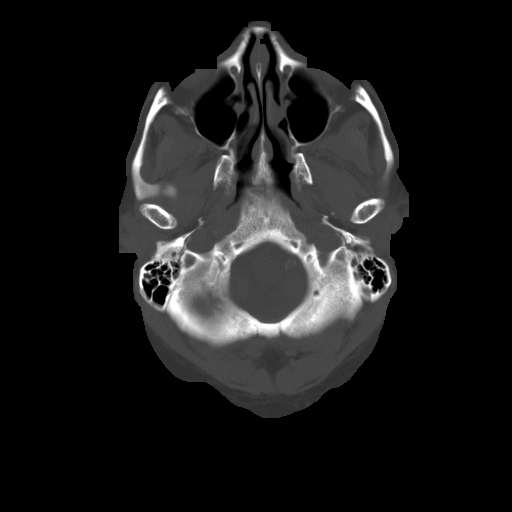
[im 6/28  bone]
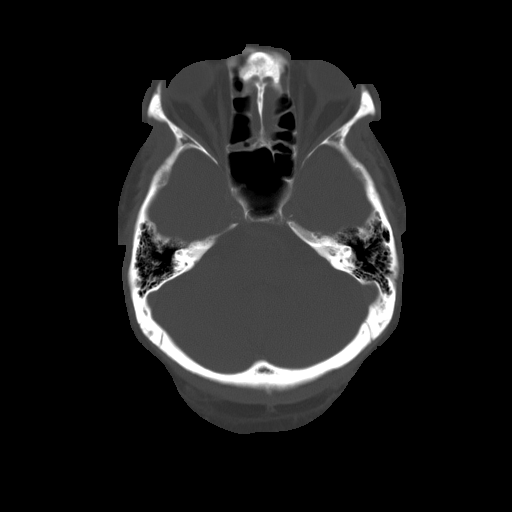

[Series 32: 3d filtered head w/o · axial · non-contrast · 0.49mm/px · z∈[+33,+160]mm · 13 of 28 slices shown, 17 images]
[im 2/28  brain]
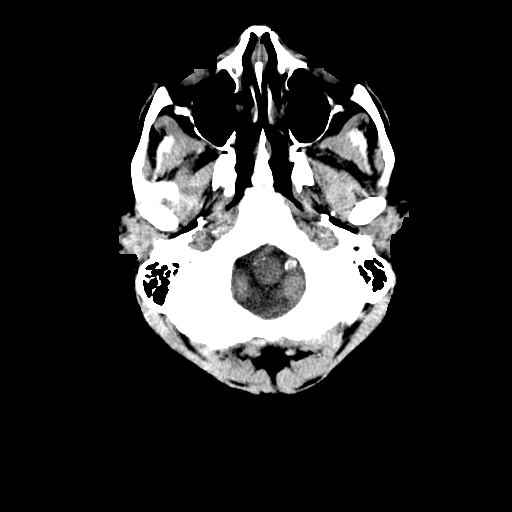
[im 2/28  bone]
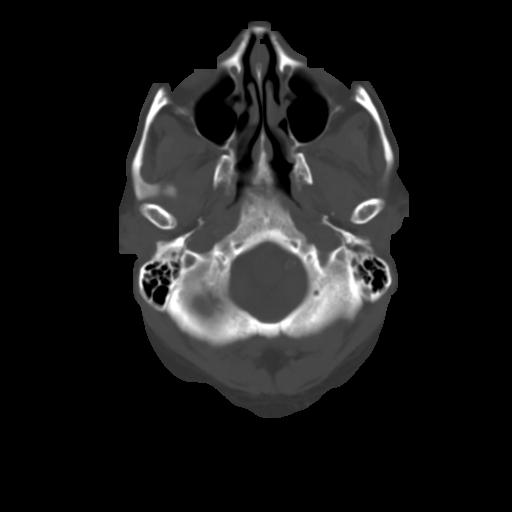
[im 4/28  brain]
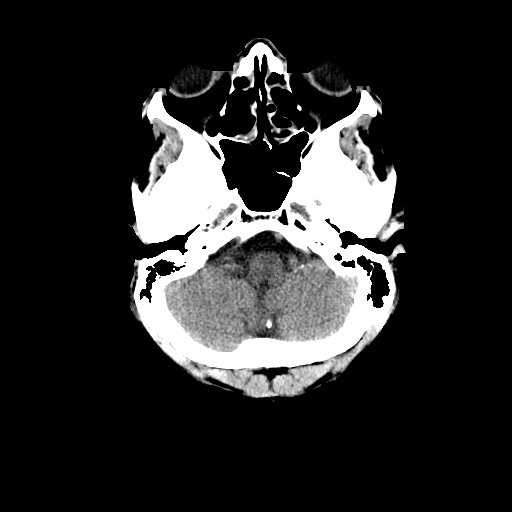
[im 6/28  brain]
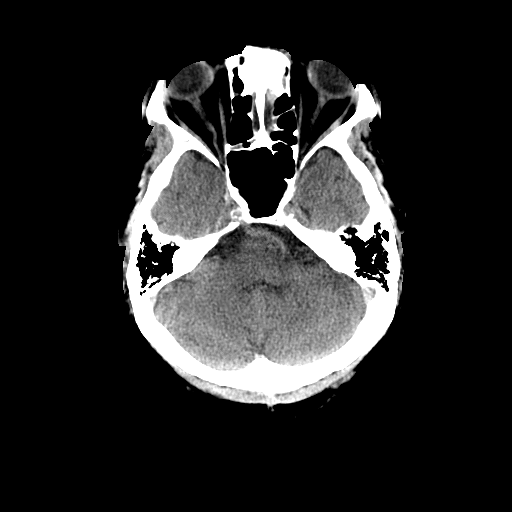
[im 8/28  brain]
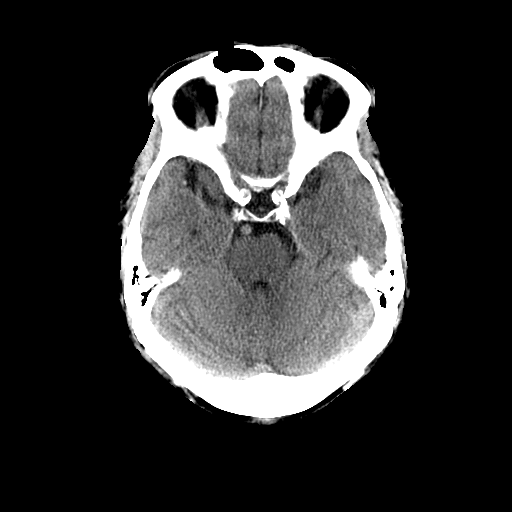
[im 10/28  brain]
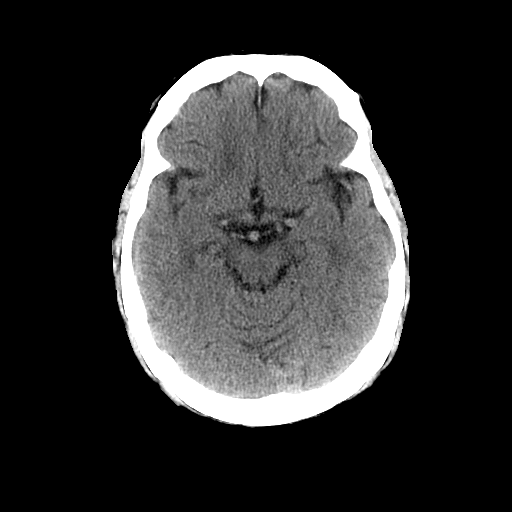
[im 10/28  bone]
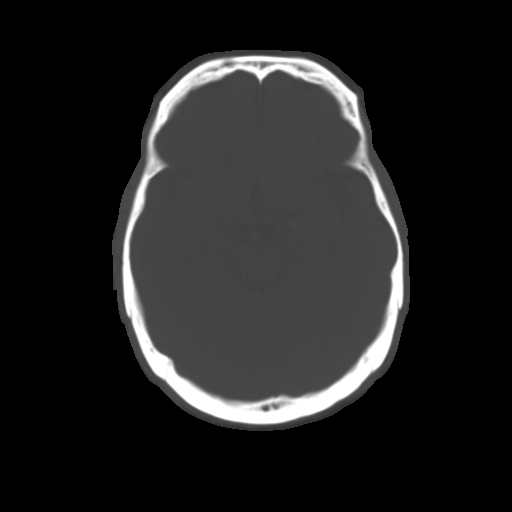
[im 12/28  brain]
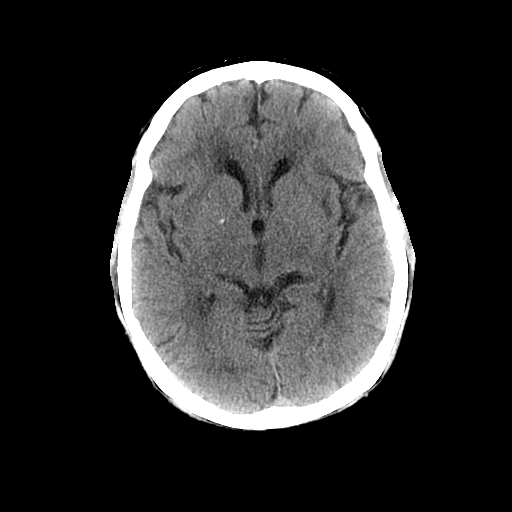
[im 14/28  brain]
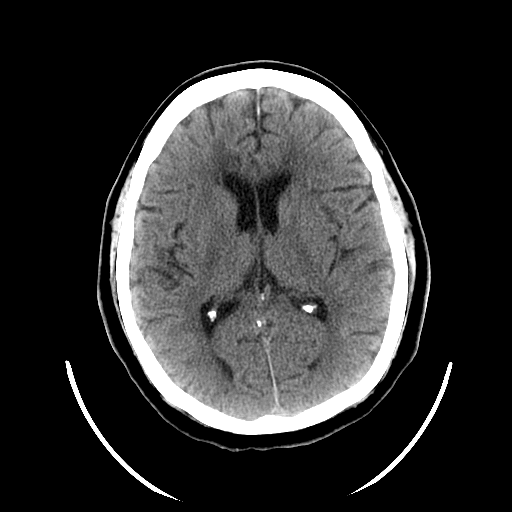
[im 16/28  brain]
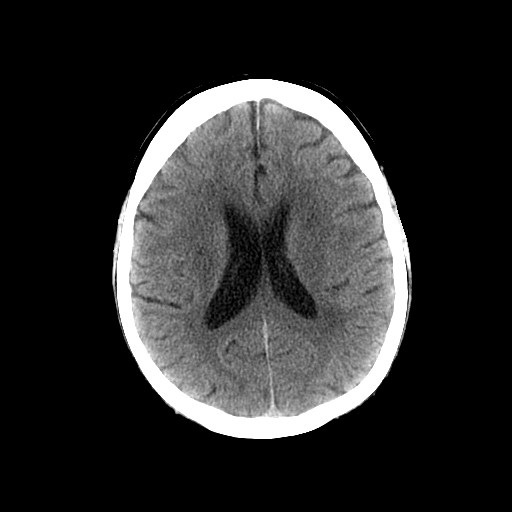
[im 18/28  brain]
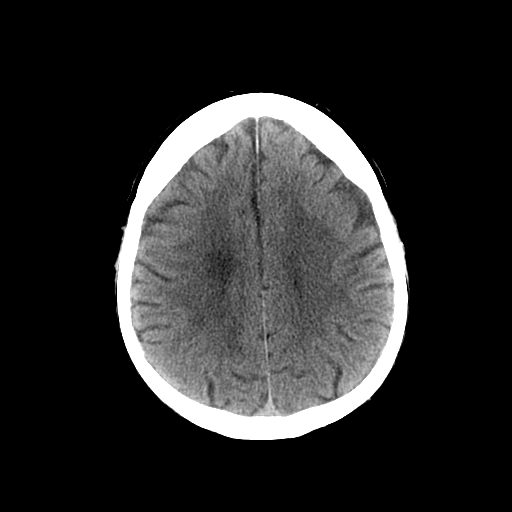
[im 18/28  bone]
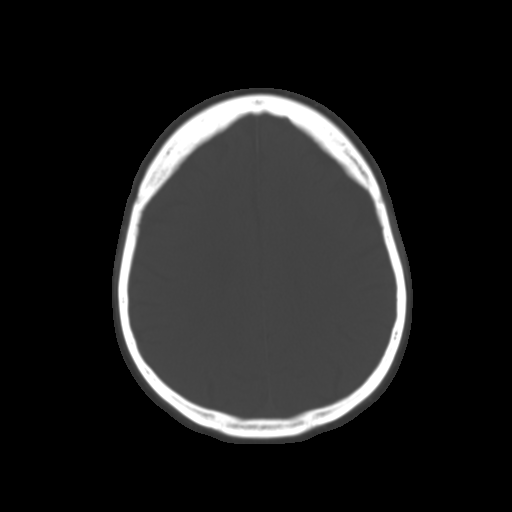
[im 20/28  brain]
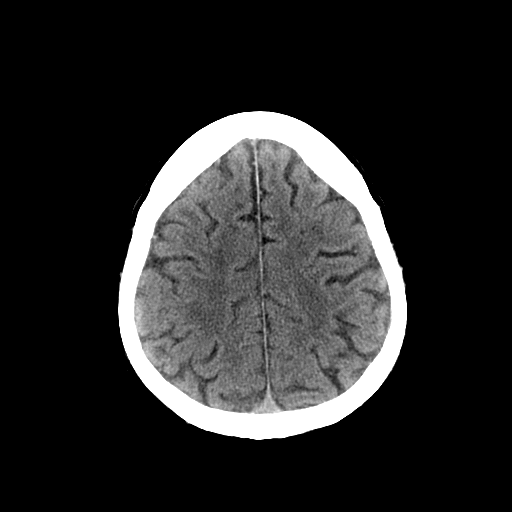
[im 22/28  brain]
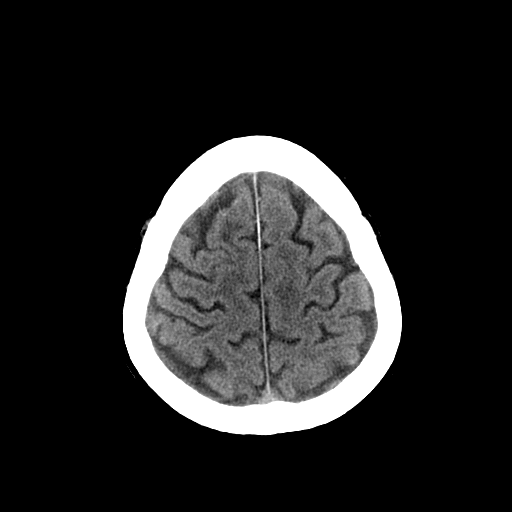
[im 24/28  brain]
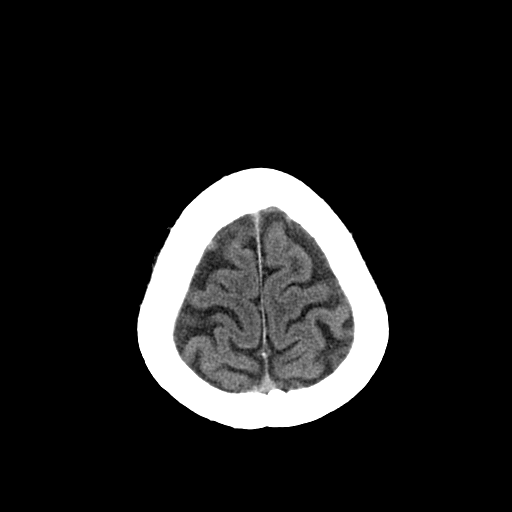
[im 26/28  brain]
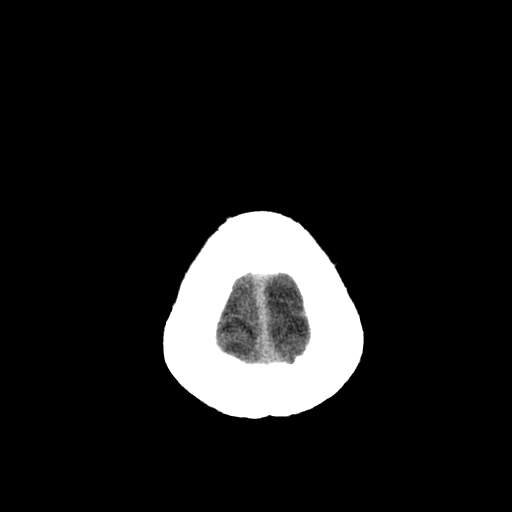
[im 26/28  bone]
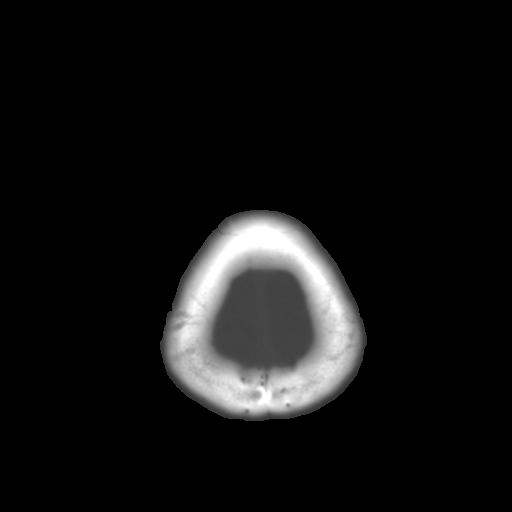

[15 of 30 positions shown; findings below may reference images not displayed]

FINDINGS: The ventricular system is stable in size and configuration, and the
septum is midline in position. Small lacunar infarcts again are
noted as is moderate small vessel ischemic change within the
periventricular white matter. No hemorrhage, mass lesion, or acute
infarction is seen. The area questioned in the left pons by previous
MR is not visible by CT brain scan. On bone window images, there is
evidence of ethmoid sinus disease. No calvarial abnormality is seen.
IMPRESSION: 1. No acute intracranial abnormality.
2. Stable changes of moderate small vessel ischemic change with tiny
lacunar infarcts as previously demonstrated.
3. Ethmoid sinus disease.

## 2017-02-21 ENCOUNTER — Telehealth (INDEPENDENT_AMBULATORY_CARE_PROVIDER_SITE_OTHER): Payer: Self-pay | Admitting: Orthopaedic Surgery

## 2017-02-21 NOTE — Telephone Encounter (Signed)
Patient called stated he can not afford the $500.00 co-pay for the injection in his knees. Patient said there maybe some assistance he can get so that he can get the injections. Patient asked for a call back as soon as possible. The number to contact patient is [phone] or [phone]

## 2017-02-22 NOTE — Telephone Encounter (Signed)
Called and spoke to patient states he cannot afford copay. I advised him I will speak to Autumn Patty the Rep to see what we can   Do.

## 2017-02-27 NOTE — Telephone Encounter (Signed)
Advised patient to call ins to see if they will help him pay for th copay. Patient states he already contacted ins company and they will fax some papers to him and then he will fill out and give  [place] the paperwork to fill out and sign and fax back. Pending inj.

## 2017-03-04 ENCOUNTER — Telehealth (INDEPENDENT_AMBULATORY_CARE_PROVIDER_SITE_OTHER): Payer: Self-pay | Admitting: Orthopaedic Surgery

## 2017-03-04 NOTE — Telephone Encounter (Signed)
Patient called asked for a call back concern ing the paperwork he left to be completed on Friday. Patient said Andrew [name] and Andrew [name] will pay for the injections for his knees. The number to contact patient is [phone]

## 2017-03-06 NOTE — Telephone Encounter (Signed)
Called pt advised I faxed the paperwork to [facility] and to let us know if he receives something back about injection(monovisc) being approved or denied. I will also keep him updated

## 2017-03-28 ENCOUNTER — Telehealth (INDEPENDENT_AMBULATORY_CARE_PROVIDER_SITE_OTHER): Payer: Self-pay | Admitting: Orthopaedic Surgery

## 2017-03-28 NOTE — Telephone Encounter (Signed)
error 

## 2017-04-01 ENCOUNTER — Ambulatory Visit (INDEPENDENT_AMBULATORY_CARE_PROVIDER_SITE_OTHER): Payer: Medicare HMO | Admitting: Orthopaedic Surgery

## 2017-04-01 DIAGNOSIS — M1712 Unilateral primary osteoarthritis, left knee: Secondary | ICD-10-CM | POA: Diagnosis not present

## 2017-04-01 NOTE — Progress Notes (Signed)
Office Visit Note   Patient: Andrew [name]           Date of Birth: 22-Jul-1955           MRN: [id number] Visit Date: 04/01/2017              Requested by: Rogue Bussing, MD [address] [place], [place] [address] PCP: Rogue Bussing, MD   Assessment & Plan: Visit Diagnoses:  1. Unilateral primary osteoarthritis, left knee     Plan: Patient has advanced patellofemoral arthritis that has failed conservative treatment. He has significant difficulty with ADLs. We discussed the details of surgery including the risks benefits alternatives. He understands and wishes to proceed. We will get him scheduled at his convenience in the near future.  Follow-Up Instructions: Return for 2 week postop visit.   Orders:  No orders of the defined types were placed in this encounter.  No orders of the defined types were placed in this encounter.     Procedures: No procedures performed   Clinical Data: No additional findings.   Subjective: No chief complaint on file.   Patient is 61 year old gentleman who follows up for bilateral knee osteoarthritis worse in the patellofemoral compartment. He checked with his insurance regarding his viscose supplementation injections in his co-pay will be $500 per injection which is cost prohibitive. At this point he has failed conservative treatment and has significant decline in quality of life and difficulty with ADLs. He would like to proceed with left total knee replacement first.    Review of Systems  Constitutional: Negative.   All other systems reviewed and are negative.    Objective: Vital Signs: There were no vitals taken for this visit.  Physical Exam  Constitutional: He is oriented to person, place, and time. He appears well-developed and well-nourished.  Pulmonary/Chest: Effort normal.  Abdominal: Soft.  Neurological: He is alert and oriented to person, place, and time.  Skin: Skin is warm.  Psychiatric: He  has a normal mood and affect. His behavior is normal. Judgment and thought content normal.  Nursing note and vitals reviewed.   Ortho Exam Left knee exam shows no joint effusion. Specialty Comments:  No specialty comments available.  Imaging: No results found.   PMFS History: Patient Active Problem List   Diagnosis Date Noted  . Left foot pain 10/22/2016  . Contracture of toe, left 10/22/2016  . Blister 08/01/2016  . Myalgia 05/16/2016  . Healthcare maintenance 04/04/2016  . Osteoarthritis of both knees 12/26/2015  . Foot lesion 03/24/2015  . Bilateral foot pain 10/26/2014  . Impairment of balance 09/02/2014  . History of stroke 09/02/2014  . De Quervain's tenosynovitis, right 11/05/2013  . Pinguecula of both eyes 09/23/2013  . Chronic hyponatremia 09/23/2013  . Hypothyroidism 05/25/2013  . Memory loss 05/21/2013  . Personal history of colonic adenomas 04/01/2013  . Joint pain 05/06/2011  . OBESITY, UNSPECIFIED 05/20/2009  . Knee pain, bilateral 04/28/2009  . LUMBAGO 03/25/2009  . PSEUDOFOLLICULITIS BARBAE 36/14/4315  . SARCOIDOSIS 02/20/2007  . Hyperlipidemia associated with type 2 diabetes mellitus ([place]) 02/20/2007  . DM (diabetes mellitus), type 2 with renal complications ([place]) 40/02/6760  . ERECTILE DYSFUNCTION 10/18/2006  . 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 Essential hypertension 10/18/2006  . CKD (chronic kidney disease) stage 3, GFR 30-59 ml/min 09/05/2006   Past Medical History:  Diagnosis Date  . Arthritis   . CRD (chronic renal disease), stage II    Baseline Cr 1.2  .  CVA (cerebral vascular accident) ([place])    x4 last one in 2007, R sided residual weakness  . HAMMER TOE 07/25/2010  . HLD (hyperlipidemia)   . HTN (hypertension)   . Leg swelling   . Lipoma of back 03/18/2011  . Personal history of colonic adenomas 04/01/2013  . Prostatitis    hx of x2  . PSEUDOFOLLICULITIS BARBAE 0/12/2692   Qualifier: Diagnosis of  By: Danise Mina   MD, Garlon Hatchet    . Sarcoidosis   . T2DM (type 2 diabetes mellitus) ([place])     Family History  Problem Relation Age of Onset  . Bone cancer Father   . Cancer Father 56       bone  . Uterine cancer Mother   . Cancer Mother   . Heart attack Brother   . Kidney disease Brother   . Diabetes Brother   . Arthritis Brother   . Heart disease Sister   . Congestive Heart Failure Sister   . Congestive Heart Failure Sister   . Congestive Heart Failure Sister   . Diabetes Sister   . Arthritis Sister   . Colon cancer Neg Hx   . Rectal cancer Neg Hx   . Stomach cancer Neg Hx     Past Surgical History:  Procedure Laterality Date  . ERCP  2006   Normal  . [place]   x2 'inguinal  . LIPOMA EXCISION  06/22/2011   Procedure: EXCISION LIPOMA;  Surgeon: Rolm Bookbinder, MD;  Location: [facility];  Service: General;  Laterality: N/A;  . TRANSTHORACIC ECHOCARDIOGRAM  2005   EF 60%   Social History   Occupational History  . Not on file.   Social History Main Topics  . Smoking status: Never Smoker  . Smokeless tobacco: Never Used  . Alcohol use 0.6 oz/week    1 Glasses of wine per week     Comment: 1 glass of wine 2 X per month  . Drug use: No  . Sexual activity: Not Currently

## 2017-04-08 ENCOUNTER — Telehealth (INDEPENDENT_AMBULATORY_CARE_PROVIDER_SITE_OTHER): Payer: Self-pay

## 2017-04-08 NOTE — Telephone Encounter (Signed)
Called patient. Last visit when he was here he said he would like for  [place] to cancel [employer] ( he had applied for some financial assistance program) and today we got a fax saying he was approved. Per patient he would like to CX that and would like to proceed with Surgery. Advised patient he would have to call [place] patient assistance program to Cx injection [phone].

## 2017-04-11 ENCOUNTER — Telehealth (INDEPENDENT_AMBULATORY_CARE_PROVIDER_SITE_OTHER): Payer: Self-pay

## 2017-04-11 NOTE — Telephone Encounter (Signed)
Called patient to advise him per last conversation we had he wanted to Cx the monovisc injection and proceed to have Surgery. I received Monovisc injection today and I had previously advised patient to call them to cx order as well. Gave him the number to call.   Called patient to discuss this no answer LMOM to return my call.......   Patient returned my call and I explained  To him again message above there is no refund on these injections and he did not have to pay for anything since he applied for financial assistance he offered to give away the injection to someone who really/truly needs it and cannot afford and/or has  no insurance.

## 2017-04-11 NOTE — Telephone Encounter (Signed)
Called patient to advise him per last conversation we had he wanted to Cx the monovisc injection and proceed to have Surgery. I received Monovisc injection today and I had previously advised patient to call them to cx order as well. Gave him the number to call.   Called patient to discuss this no answer LMOM to return my call.

## 2017-04-29 ENCOUNTER — Ambulatory Visit (INDEPENDENT_AMBULATORY_CARE_PROVIDER_SITE_OTHER): Payer: Medicare HMO | Admitting: Internal Medicine

## 2017-04-29 ENCOUNTER — Encounter: Payer: Self-pay | Admitting: Internal Medicine

## 2017-04-29 VITALS — BP 132/70 | HR 58 | Temp 98.2°F | Ht 73.0 in | Wt 225.0 lb

## 2017-04-29 DIAGNOSIS — E1122 Type 2 diabetes mellitus with diabetic chronic kidney disease: Secondary | ICD-10-CM | POA: Diagnosis not present

## 2017-04-29 DIAGNOSIS — M1A09X0 Idiopathic chronic gout, multiple sites, without tophus (tophi): Secondary | ICD-10-CM | POA: Diagnosis not present

## 2017-04-29 DIAGNOSIS — N189 Chronic kidney disease, unspecified: Secondary | ICD-10-CM

## 2017-04-29 LAB — POCT GLYCOSYLATED HEMOGLOBIN (HGB A1C): Hemoglobin A1C: 6.8

## 2017-04-29 MED ORDER — TRAMADOL HCL 50 MG PO TABS: 50.0000 mg | ORAL_TABLET | Freq: Three times a day (TID) | ORAL | 0 refills | Status: DC | PRN

## 2017-04-29 NOTE — Patient Instructions (Signed)
Andrew [name],  Your blood sugar is very well controlled. Good job with the dietary changes.  I will call you with your lab results. Increase allopurinol to 200 mg daily. I will send this to your mail order pharmacy.  Best, Dr. Ola [name]  Low-Purine Diet Purines are compounds that affect the level of uric acid in your body. A low-purine diet is a diet that is low in purines. Eating a low-purine diet can prevent the level of uric acid in your body from getting too high and causing gout or kidney stones or both. What do I need to know about this diet?  Choose low-purine foods. Examples of low-purine foods are listed in the next section.  Drink plenty of fluids, especially water. Fluids can help remove uric acid from your body. Try to drink 8-16 cups (1.9-3.8 L) a day.  Limit foods high in fat, especially saturated fat, as fat makes it harder for the body to get rid of uric acid. Foods high in saturated fat include pizza, cheese, ice cream, whole milk, fried foods, and gravies. Choose foods that are lower in fat and lean sources of protein. Use olive oil when cooking as it contains healthy fats that are not high in saturated fat.  Limit alcohol. Alcohol interferes with the elimination of uric acid from your body. If you are having a gout attack, avoid all alcohol.  Keep in mind that different people's bodies react differently to different foods. You will probably learn over time which foods do or do not affect you. If you discover that a food tends to cause your gout to flare up, avoid eating that food. You can more freely enjoy foods that do not cause problems. If you have any questions about a food item, talk to your dietitian or health care provider. Which foods are low, moderate, and high in purines? The following is a list of foods that are low, moderate, and high in purines. You can eat any amount of the foods that are low in purines. You may be able to have small amounts of foods that are  moderate in purines. Ask your health care provider how much of a food moderate in purines you can have. Avoid foods high in purines. Grains  Foods low in purines: Enriched white bread, pasta, rice, cake, cornbread, popcorn.  Foods moderate in purines: Whole-grain breads and cereals, wheat germ, bran, oatmeal. Uncooked oatmeal. Dry wheat bran or wheat germ.  Foods high in purines: Pancakes, [place] toast, biscuits, muffins. Vegetables  Foods low in purines: All vegetables, except those that are moderate in purines.  Foods moderate in purines: Asparagus, cauliflower, spinach, mushrooms, green peas. Fruits  All fruits are low in purines. Meats and other Protein Foods  Foods low in purines: Eggs, nuts, peanut butter.  Foods moderate in purines: 80-90% lean beef, lamb, veal, pork, poultry, fish, eggs, peanut butter, nuts. Crab, lobster, oysters, and shrimp. Cooked dried beans, peas, and lentils.  Foods high in purines: Anchovies, sardines, herring, mussels, tuna, codfish, scallops, trout, and haddock. Andrew [name]. Organ meats (such as liver or kidney). Tripe. Game meat. Goose. Sweetbreads. Dairy  All dairy foods are low in purines. Low-fat and fat-free dairy products are best because they are low in saturated fat. Beverages  Drinks low in purines: Water, carbonated beverages, tea, coffee, cocoa.  Drinks moderate in purines: Soft drinks and other drinks sweetened with high-fructose corn syrup. Juices. To find whether a food or drink is sweetened with high-fructose corn syrup, look at the  ingredients list.  Drinks high in purines: Alcoholic beverages (such as beer). Condiments  Foods low in purines: Salt, herbs, olives, pickles, relishes, vinegar.  Foods moderate in purines: Butter, margarine, oils, mayonnaise. Fats and Oils  Foods low in purines: All types, except gravies and sauces made with meat.  Foods high in purines: Gravies and sauces made with meat. Other Foods  Foods low in  purines: Sugars, sweets, gelatin. Cake. Soups made without meat.  Foods moderate in purines: Meat-based or fish-based soups, broths, or bouillons. Foods and drinks sweetened with high-fructose corn syrup.  Foods high in purines: High-fat desserts (such as ice cream, cookies, cakes, pies, doughnuts, and chocolate). Contact your dietitian for more information on foods that are not listed here. This information is not intended to replace advice given to you by your health care provider. Make sure you discuss any questions you have with your health care provider. Document Released: 10/20/2010 Document Revised: 12/01/2015 Document Reviewed: 06/01/2013 Elsevier Interactive Patient Education  2017 [employer].

## 2017-04-30 LAB — COMPREHENSIVE METABOLIC PANEL
A/G RATIO: 1.4 (ref 1.2–2.2)
ALT: 14 IU/L (ref 0–44)
AST: 20 IU/L (ref 0–40)
Albumin: 4.2 g/dL (ref 3.6–4.8)
Alkaline Phosphatase: 63 IU/L (ref 39–117)
BUN/Creatinine Ratio: 11 (ref 10–24)
BUN: 17 mg/dL (ref 8–27)
Bilirubin Total: 0.2 mg/dL (ref 0.0–1.2)
CO2: 24 mmol/L (ref 20–29)
CREATININE: 1.55 mg/dL — AB (ref 0.76–1.27)
Calcium: 9.1 mg/dL (ref 8.6–10.2)
Chloride: 102 mmol/L (ref 96–106)
GFR, EST AFRICAN AMERICAN: 55 mL/min/{1.73_m2} — AB (ref >59)
GFR, EST NON AFRICAN AMERICAN: 48 mL/min/{1.73_m2} — AB (ref >59)
GLOBULIN, TOTAL: 3.1 g/dL (ref 1.5–4.5)
Glucose: 155 mg/dL — ABNORMAL HIGH (ref 65–99)
POTASSIUM: 4.6 mmol/L (ref 3.5–5.2)
SODIUM: 139 mmol/L (ref 134–144)
TOTAL PROTEIN: 7.3 g/dL (ref 6.0–8.5)

## 2017-04-30 LAB — URIC ACID: Uric Acid: 3.9 mg/dL (ref 3.7–8.6)

## 2017-04-30 MED ORDER — ALLOPURINOL 100 MG PO TABS: 200.0000 mg | ORAL_TABLET | Freq: Every day | ORAL | 3 refills | Status: DC

## 2017-05-02 DIAGNOSIS — M1A09X0 Idiopathic chronic gout, multiple sites, without tophus (tophi): Secondary | ICD-10-CM | POA: Insufficient documentation

## 2017-05-02 NOTE — Assessment & Plan Note (Signed)
-   Well controlled on actos. A1c improved to 6.8 from 7.0 three months ago, likely to patient's improvements in diet. - Continue current regimen - On enalapril. Will check CMP - Obtain records from Dr. Kellie Moor office for recent eye exam

## 2017-05-02 NOTE — Assessment & Plan Note (Signed)
-   Flare resolved.  - Recommended increasing allopurinol to 200 mg daily. - Provided handout on foods that can incite flares - Repeat SCr at follow-up

## 2017-05-02 NOTE — Progress Notes (Signed)
[facility] Progress Note  Subjective:  Andrew [name] is a 61 y.o. male with history of T2DM, HTN, CKD3, OA and gout who presents to follow-up diabetes and gout.   #T2DM: - Takes actos 30 mg daily. Stopped metformin due to elevated SCr in past.  - Still checking his blood sugars a couple times a day because it "makes [him] feel better" - Has been cutting out processed foods in his diet - Would like to increase walking after his knee replacements are done - Says he saw Ophthalmologist Dr. Gershon Crane this year ROS: no dizziness, no abdominal pain  #Gout: - Reports recent flare over the weekend that involved multiple joints in hands and both big toes; symptoms have since resolved - usually improves with tramadol but prescription was out - feels that he is getting flares more often - has noted that alcohol and certain meats cause symptoms, so he no longer drinks EtOH and avoids red meat - says he drinks plenty of water - taking 100 mg allopurinol daily ROS: No fevers  Of note, patient to have left knee replacement surgery performed November 9th.    Allergies  Allergen Reactions  . Metoprolol Succinate     REACTION: bradycardia (HR to 42)  . Penicillins Itching    Has patient had a PCN reaction causing immediate rash, facial/tongue/throat swelling, SOB or lightheadedness with hypotension: No Has patient had a PCN reaction causing severe rash involving mucus membranes or skin necrosis: No Has patient had a PCN reaction that required hospitalization No Has patient had a PCN reaction occurring within the last 10 years: No If all of the above answers are "NO", then may proceed with Cephalosporin use.    Social History  Substance Use Topics  . Smoking status: Never Smoker  . Smokeless tobacco: Never Used  . Alcohol use 0.6 oz/week    1 Glasses of wine per week     Comment: 1 glass of wine 2 X per month    Objective: Blood pressure 132/70, pulse (!) 58,  temperature 98.2 F (36.8 C), temperature source Oral, height 6\' 1"  (1.854 m), weight 225 lb (102.1 kg), SpO2 98 %. Body mass index is 29.69 kg/m. Constitutional: Overweight male in NAD Cardiovascular: RRR, S1, S2, no m/r/g.  Pulmonary/Chest: Effort normal and breath sounds normal.  Musculoskeletal: No obvious swelling or TTP of bilateral big toes or of wrists or fingers.  Skin: Skin is warm and dry. No rash or erythema noted.  Psychiatric: Normal mood and affect.  Vitals reviewed  Assessment/Plan: DM (diabetes mellitus), type 2 with renal complications - Well controlled on actos. A1c improved to 6.8 from 7.0 three months ago, likely to patient's improvements in diet. - Continue current regimen - On enalapril. Will check CMP - Obtain records from Dr. Kellie Moor office for recent eye exam  Chronic gout of multiple sites - Flare resolved.  - Recommended increasing allopurinol to 200 mg daily. - Provided handout on foods that can incite flares - Repeat SCr at follow-up  HM: Received flu vaccine and 2nd shingrix vaccine 03/15/17  Follow-up in about 3 months.  Olene Floss, MD [place], PGY-3

## 2017-05-06 ENCOUNTER — Telehealth (INDEPENDENT_AMBULATORY_CARE_PROVIDER_SITE_OTHER): Payer: Self-pay | Admitting: Orthopaedic Surgery

## 2017-05-06 NOTE — Telephone Encounter (Signed)
Minna Merritts from [facility] called stating that the patient has a pre-op appointment tomorrow and there are not orders in the system for his surgery.  CB#[phone].  Thank you

## 2017-05-06 NOTE — Pre-Procedure Instructions (Signed)
Andrew [name]  05/06/2017      [facility] [address] [place] [address] Phone: [phone]   [facility] - [place], [place] [address] Phone: [phone] Fax: [phone]  [id number] [place], [place] [address] Phone: [phone] Fax: [phone]  [facility] [place], [place] [address] [place] [address] Phone: [phone] Fax: [phone]    Your procedure is scheduled on Wednesday, May 15, 2017  Report to [facility] Admitting Entrance "A" at 10:45 A.M.  Call this number if you have problems the morning of surgery:  [phone]   Remember:  Do not eat food or drink liquids after midnight.  Take these medicines the morning of surgery with A SIP OF WATER: AmLODipine (NORVASC) and Levothyroxine (SYNTHROID, LEVOTHROID). If needed  TraMADol (ULTRAM) for pain and Cyclobenzaprine (FLEXERIL) for spasms.  Follow your doctor's instruction regarding Aspirin.  7 days before surgery (Oct. 31),  stop taking all Aspirins, Vitamins, Fish oils, and Herbal medications. Also stop all NSAIDS i.e. Advil, Ibuprofen, Motrin, Aleve, Anaprox, Naproxen, BC and Goody Powders.  How to Manage Your Diabetes Before and After Surgery  Why is it important to control my blood sugar before and after surgery? . Improving blood sugar levels before and after surgery helps healing and can limit problems. . A way of improving blood sugar control is eating a healthy diet by: o  Eating less sugar and carbohydrates o  Increasing activity/exercise o  Talking with your doctor about reaching your blood sugar goals . High blood sugars (greater than 180 mg/dL) can raise your risk of infections and slow your recovery, so you will need to focus on controlling your diabetes during the  weeks before surgery. . Make sure that the doctor who takes care of your diabetes knows about your planned surgery including the date and location.  How do I manage my blood sugar before surgery? . Check your blood sugar at least 4 times a day, starting 2 days before surgery, to make sure that the level is not too high or low. o Check your blood sugar the morning of your surgery when you wake up and every 2 hours until you get to the Short Stay unit. . If your blood sugar is less than 70 mg/dL, you will need to treat for low blood sugar: o Do not take insulin. o Treat a low blood sugar (less than 70 mg/dL) with  cup of clear juice (cranberry or apple), 4 glucose tablets, OR glucose gel. o Recheck blood sugar in 15 minutes after treatment (to make sure it is greater than 70 mg/dL). If your blood sugar is not greater than 70 mg/dL on recheck, call [phone] for further instructions. . Report your blood sugar to the short stay nurse when you get to Short Stay.  . If you are admitted to the hospital after surgery: o Your blood sugar will be checked by the staff and you will probably be given insulin after surgery (instead of oral diabetes medicines) to make sure you have good blood sugar levels. o The goal for blood sugar control after surgery is 80-180 mg/dL.  WHAT DO I DO ABOUT MY DIABETES MEDICATION?  Marland Kitchen Do not take Pioglitazone (ACTOS)  the morning of surgery.  . If your CBG is greater than 220 mg/dL, call  us at [phone]   Do not wear jewelry.  Do not wear lotions, powders, colognes, or deodorant.  Do not shave 48 hours prior to surgery.  Men may shave face and neck.  Do not bring valuables to the hospital.  [facility] is not responsible for any belongings or valuables.  Contacts, dentures or bridgework may not be worn into surgery.  Leave your suitcase in the car.  After surgery it may be brought to your room.  For patients admitted to the hospital, discharge time will be  determined by your treatment team.  Patients discharged the day of surgery will not be allowed to drive home.   Special instructions: Andrew [name]- Preparing For Surgery  Before surgery, you can play an important role. Because skin is not sterile, your skin needs to be as free of germs as possible. You can reduce the number of germs on your skin by washing with CHG (chlorahexidine gluconate) Soap before surgery.  CHG is an antiseptic cleaner which kills germs and bonds with the skin to continue killing germs even after washing.  Please do not use if you have an allergy to CHG or antibacterial soaps. If your skin becomes reddened/irritated stop using the CHG.  Do not shave (including legs and underarms) for at least 48 hours prior to first CHG shower. It is OK to shave your face.  Please follow these instructions carefully.   1. Shower the NIGHT BEFORE SURGERY and the MORNING OF SURGERY with CHG.   2. If you chose to wash your hair, wash your hair first as usual with your normal shampoo.  3. After you shampoo, rinse your hair and body thoroughly to remove the shampoo.  4. Use CHG as you would any other liquid soap. You can apply CHG directly to the skin and wash gently with a scrungie or a clean washcloth.   5. Apply the CHG Soap to your body ONLY FROM THE NECK DOWN.  Do not use on open wounds or open sores. Avoid contact with your eyes, ears, mouth and genitals (private parts). Wash Face and genitals (private parts)  with your normal soap.  6. Wash thoroughly, paying special attention to the area where your surgery will be performed.  7. Thoroughly rinse your body with warm water from the neck down.  8. DO NOT shower/wash with your normal soap after using and rinsing off the CHG Soap.  9. Pat yourself dry with a CLEAN TOWEL.  10. Wear CLEAN PAJAMAS to bed the night before surgery, wear comfortable clothes the morning of surgery  11. Place CLEAN SHEETS on your bed the night of your  first shower and DO NOT SLEEP WITH PETS.  Day of Surgery: Do not apply any deodorants/lotions. Please wear clean clothes to the hospital/surgery center.    Please read over the following fact sheets that you were given. Pain Booklet, Coughing and Deep Breathing, MRSA Information and Surgical Site Infection Prevention

## 2017-05-06 NOTE — Telephone Encounter (Signed)
Please put orders in

## 2017-05-06 NOTE — Telephone Encounter (Signed)
Please put in orders. Thanks.

## 2017-05-07 ENCOUNTER — Encounter (HOSPITAL_COMMUNITY): Payer: Self-pay

## 2017-05-07 ENCOUNTER — Encounter (HOSPITAL_COMMUNITY)
Admission: RE | Admit: 2017-05-07 | Discharge: 2017-05-07 | Disposition: A | Discharge status: Home / Self Care | Payer: Medicare HMO | Source: Ambulatory Visit | Attending: Orthopaedic Surgery | Admitting: Orthopaedic Surgery

## 2017-05-07 DIAGNOSIS — Z01818 Encounter for other preprocedural examination: Secondary | ICD-10-CM | POA: Insufficient documentation

## 2017-05-07 DIAGNOSIS — E119 Type 2 diabetes mellitus without complications: Secondary | ICD-10-CM | POA: Diagnosis not present

## 2017-05-07 DIAGNOSIS — R001 Bradycardia, unspecified: Secondary | ICD-10-CM | POA: Diagnosis not present

## 2017-05-07 DIAGNOSIS — Z01812 Encounter for preprocedural laboratory examination: Secondary | ICD-10-CM | POA: Insufficient documentation

## 2017-05-07 DIAGNOSIS — I491 Atrial premature depolarization: Secondary | ICD-10-CM | POA: Insufficient documentation

## 2017-05-07 DIAGNOSIS — I10 Essential (primary) hypertension: Secondary | ICD-10-CM | POA: Insufficient documentation

## 2017-05-07 HISTORY — DX: Hypothyroidism, unspecified: E03.9

## 2017-05-07 LAB — SURGICAL PCR SCREEN
MRSA, PCR: NEGATIVE
STAPHYLOCOCCUS AUREUS: NEGATIVE

## 2017-05-07 LAB — BASIC METABOLIC PANEL
ANION GAP: 9 (ref 5–15)
BUN: 17 mg/dL (ref 6–20)
CALCIUM: 9.1 mg/dL (ref 8.9–10.3)
CO2: 21 mmol/L — ABNORMAL LOW (ref 22–32)
CREATININE: 1.5 mg/dL — AB (ref 0.61–1.24)
Chloride: 106 mmol/L (ref 101–111)
GFR, EST AFRICAN AMERICAN: 56 mL/min — AB (ref >60)
GFR, EST NON AFRICAN AMERICAN: 49 mL/min — AB (ref >60)
Glucose, Bld: 137 mg/dL — ABNORMAL HIGH (ref 65–99)
Potassium: 4.2 mmol/L (ref 3.5–5.1)
SODIUM: 136 mmol/L (ref 135–145)

## 2017-05-07 LAB — CBC
HCT: 42.3 % (ref 39.0–52.0)
HEMOGLOBIN: 13.8 g/dL (ref 13.0–17.0)
MCH: 30.9 pg (ref 26.0–34.0)
MCHC: 32.6 g/dL (ref 30.0–36.0)
MCV: 94.8 fL (ref 78.0–100.0)
PLATELETS: 178 10*3/uL (ref 150–400)
RBC: 4.46 MIL/uL (ref 4.22–5.81)
RDW: 13.9 % (ref 11.5–15.5)
WBC: 5.1 10*3/uL (ref 4.0–10.5)

## 2017-05-07 LAB — GLUCOSE, CAPILLARY: Glucose-Capillary: 126 mg/dL — ABNORMAL HIGH (ref 65–99)

## 2017-05-07 LAB — NO BLOOD PRODUCTS

## 2017-05-07 NOTE — Progress Notes (Signed)
Andrew [name] denies shortness of breath or chest pain.  Patient does not see a cardiologist.  Patient is a Type II DM.  Patient reports that CBG's run in the 110's- 120's.  PCP is Dr Olene Floss.  Last A1C was 6.8 on 04/29/17.

## 2017-05-07 NOTE — Pre-Procedure Instructions (Signed)
Andrew [name]  05/07/2017    Your procedure is scheduled on Wednesday, May 15, 2017  Report to [facility] Admitting Entrance "A" at 10:45 A.M.                 Your surgery or procedure is scheduled for 12:45 PM   Call this number if you have problems the morning of surgery:[phone]- pre- op desk   Remember:  Do not eat food or drink liquids after midnight Tuesday, November 6.  Take these medicines the morning of surgery with A SIP OF WATER:  AmLODipine (NORVASC)  Levothyroxine (SYNTHROID, LEVOTHROID).  If needed  TraMADol (ULTRAM) for pain and Cyclobenzaprine (FLEXERIL) for spasms.  . Do not take Pioglitazone (ACTOS)  the morning of surgery.  Follow your doctor's instruction regarding Aspirin.  7 days before surgery (Oct. 31),  stop taking all Aspirin Products, Vitamins, Fish oils, and Herbal medications. Also stop all NSAIDS i.e. Advil, Ibuprofen, Motrin, Aleve, Anaprox, Naproxen, BC and Goody Powders.  How to Manage Your Diabetes Before and After Surgery  Why is it important to control my blood sugar before and after surgery? . Improving blood sugar levels before and after surgery helps healing and can limit problems. . A way of improving blood sugar control is eating a healthy diet by: o  Eating less sugar and carbohydrates o  Increasing activity/exercise o  Talking with your doctor about reaching your blood sugar goals . High blood sugars (greater than 180 mg/dL) can raise your risk of infections and slow your recovery, so you will need to focus on controlling your diabetes during the weeks before surgery. . Make sure that the doctor who takes care of your diabetes knows about your planned surgery including the date and location.  How do I manage my blood sugar before surgery? . Check your blood sugar at least 4 times a day, starting 2 days before surgery, to make sure that the level is not too high or low. o Check your blood sugar the morning  of your surgery when you wake up and every 2 hours until you get to the Short Stay unit. . If your blood sugar is less than 70 mg/dL, you will need to treat for low blood sugar: o Do not take insulin. o Treat a low blood sugar (less than 70 mg/dL) with  cup of clear juice (cranberry or apple), 4 glucose tablets, OR glucose gel. o Recheck blood sugar in 15 minutes after treatment (to make sure it is greater than 70 mg/dL). If your blood sugar is not greater than 70 mg/dL on recheck, call [phone] for further instructions. . Report your blood sugar to the short stay nurse when you get to Short Stay.  . If you are admitted to the hospital after surgery: o Your blood sugar will be checked by the staff and you will probably be given insulin after surgery (instead of oral diabetes medicines) to make sure you have good blood sugar levels. o The goal for blood sugar control after surgery is 80-180 mg/dL.  WHAT DO I DO ABOUT MY DIABETES MEDICATION?  Marland Kitchen Do not take Pioglitazone (ACTOS)  the morning of surgery.  . If your CBG is greater than 220 mg/dL, call us at [phone]   Special instructions: [facility]- Preparing For Surgery  Before surgery, you can play an important role. Because skin is not sterile, your skin needs to be as free of germs as possible. You can reduce the  number of germs on your skin by washing with CHG (chlorahexidine gluconate) Soap before surgery.  CHG is an antiseptic cleaner which kills germs and bonds with the skin to continue killing germs even after washing.  Please do not use if you have an allergy to CHG or antibacterial soaps. If your skin becomes reddened/irritated stop using the CHG.  Do not shave (including legs and underarms) for at least 48 hours prior to first CHG shower. It is OK to shave your face.  Please follow these instructions carefully.   1. Shower the NIGHT BEFORE SURGERY and the MORNING OF SURGERY with CHG.   2. If you chose to wash your  hair, wash your hair first as usual with your normal shampoo.  3. After you shampoo, rinse your hair and body thoroughly to remove the shampoo.    Wash your face and private area with the soap you use at home, then rinse.  4. Use CHG as you would any other liquid soap. You can apply CHG directly to the skin and wash gently with a scrungie or a clean washcloth.   5. Apply the CHG Soap to your body ONLY FROM THE NECK DOWN.  Do not use on open wounds or open sores. Avoid contact with your eyes, ears, mouth and genitals (private parts). Wash Face and genitals (private parts)  with your normal soap.  6. Wash thoroughly, paying special attention to the area where your surgery will be performed.  7. Thoroughly rinse your body with warm water from the neck down.  8. DO NOT shower/wash with your normal soap after using and rinsing off the CHG Soap.  9. Pat yourself dry with a CLEAN TOWEL.  10. Wear CLEAN PAJAMAS to bed the night before surgery, wear comfortable clothes the morning of surgery  11. Place CLEAN SHEETS on your bed the night of your first shower and DO NOT SLEEP WITH PETS.  Day of Surgery: Shower as Above Do not apply any deodorants/lotions, powders, colognes. Please wear clean clothes to the hospital/surgery center.    Do not wear jewelry.  Do not wear lotions, powders, colognes, or deodorant.  Do not shave 48 hours prior to surgery.  Men may shave face and neck.  Do not bring valuables to the hospital.  [facility] is not responsible for any belongings or valuables  Please read over the following fact sheets that you were given: Pain Booklet, Coughing and Deep Breathing, MRSA Information and Surgical Site Infection Prevention

## 2017-05-09 ENCOUNTER — Other Ambulatory Visit (INDEPENDENT_AMBULATORY_CARE_PROVIDER_SITE_OTHER): Payer: Self-pay | Admitting: Orthopaedic Surgery

## 2017-05-09 DIAGNOSIS — M1712 Unilateral primary osteoarthritis, left knee: Secondary | ICD-10-CM

## 2017-05-14 MED ORDER — TRANEXAMIC ACID 1000 MG/10ML IV SOLN
1000.0000 mg | INTRAVENOUS | Status: DC
  Filled 2017-05-14 (×2): qty 10

## 2017-05-14 MED ORDER — BUPIVACAINE LIPOSOME 1.3 % IJ SUSP
20.0000 mL | Freq: Once | INTRAMUSCULAR | Status: DC
  Filled 2017-05-14: qty 20

## 2017-05-15 ENCOUNTER — Inpatient Hospital Stay (HOSPITAL_COMMUNITY)
Admission: RE | Admit: 2017-05-15 | Discharge: 2017-05-17 | DRG: 470 | Disposition: A | Discharge status: Home Health | Payer: Medicare HMO | Source: Ambulatory Visit | Attending: Orthopaedic Surgery | Admitting: Orthopaedic Surgery

## 2017-05-15 ENCOUNTER — Inpatient Hospital Stay (HOSPITAL_COMMUNITY): Payer: Medicare HMO | Admitting: Anesthesiology

## 2017-05-15 ENCOUNTER — Encounter (HOSPITAL_COMMUNITY)
Admission: RE | Disposition: A | Discharge status: Home Health | Payer: Self-pay | Source: Ambulatory Visit | Attending: Orthopaedic Surgery

## 2017-05-15 ENCOUNTER — Encounter (HOSPITAL_COMMUNITY): Payer: Self-pay

## 2017-05-15 ENCOUNTER — Inpatient Hospital Stay (HOSPITAL_COMMUNITY): Payer: Medicare HMO

## 2017-05-15 DIAGNOSIS — G8918 Other acute postprocedural pain: Secondary | ICD-10-CM | POA: Diagnosis not present

## 2017-05-15 DIAGNOSIS — Z8049 Family history of malignant neoplasm of other genital organs: Secondary | ICD-10-CM | POA: Diagnosis not present

## 2017-05-15 DIAGNOSIS — M1712 Unilateral primary osteoarthritis, left knee: Secondary | ICD-10-CM | POA: Diagnosis not present

## 2017-05-15 DIAGNOSIS — E785 Hyperlipidemia, unspecified: Secondary | ICD-10-CM | POA: Diagnosis present

## 2017-05-15 DIAGNOSIS — E039 Hypothyroidism, unspecified: Secondary | ICD-10-CM | POA: Diagnosis present

## 2017-05-15 DIAGNOSIS — E119 Type 2 diabetes mellitus without complications: Secondary | ICD-10-CM | POA: Diagnosis not present

## 2017-05-15 DIAGNOSIS — Z8249 Family history of ischemic heart disease and other diseases of the circulatory system: Secondary | ICD-10-CM | POA: Diagnosis not present

## 2017-05-15 DIAGNOSIS — Z833 Family history of diabetes mellitus: Secondary | ICD-10-CM | POA: Diagnosis not present

## 2017-05-15 DIAGNOSIS — Z96651 Presence of right artificial knee joint: Secondary | ICD-10-CM

## 2017-05-15 DIAGNOSIS — Z96659 Presence of unspecified artificial knee joint: Secondary | ICD-10-CM

## 2017-05-15 DIAGNOSIS — Z888 Allergy status to other drugs, medicaments and biological substances status: Secondary | ICD-10-CM

## 2017-05-15 DIAGNOSIS — E1122 Type 2 diabetes mellitus with diabetic chronic kidney disease: Secondary | ICD-10-CM | POA: Diagnosis not present

## 2017-05-15 DIAGNOSIS — Z471 Aftercare following joint replacement surgery: Secondary | ICD-10-CM | POA: Diagnosis not present

## 2017-05-15 DIAGNOSIS — M179 Osteoarthritis of knee, unspecified: Secondary | ICD-10-CM | POA: Diagnosis not present

## 2017-05-15 DIAGNOSIS — Z880 Allergy status to penicillin: Secondary | ICD-10-CM | POA: Diagnosis not present

## 2017-05-15 DIAGNOSIS — D860 Sarcoidosis of lung: Secondary | ICD-10-CM | POA: Diagnosis not present

## 2017-05-15 DIAGNOSIS — I129 Hypertensive chronic kidney disease with stage 1 through stage 4 chronic kidney disease, or unspecified chronic kidney disease: Secondary | ICD-10-CM | POA: Diagnosis not present

## 2017-05-15 DIAGNOSIS — D62 Acute posthemorrhagic anemia: Secondary | ICD-10-CM | POA: Diagnosis not present

## 2017-05-15 DIAGNOSIS — I69351 Hemiplegia and hemiparesis following cerebral infarction affecting right dominant side: Secondary | ICD-10-CM

## 2017-05-15 DIAGNOSIS — Z8261 Family history of arthritis: Secondary | ICD-10-CM

## 2017-05-15 DIAGNOSIS — Z96652 Presence of left artificial knee joint: Secondary | ICD-10-CM | POA: Diagnosis not present

## 2017-05-15 DIAGNOSIS — Z841 Family history of disorders of kidney and ureter: Secondary | ICD-10-CM | POA: Diagnosis not present

## 2017-05-15 DIAGNOSIS — N182 Chronic kidney disease, stage 2 (mild): Secondary | ICD-10-CM | POA: Diagnosis not present

## 2017-05-15 DIAGNOSIS — Z808 Family history of malignant neoplasm of other organs or systems: Secondary | ICD-10-CM | POA: Diagnosis not present

## 2017-05-15 DIAGNOSIS — I10 Essential (primary) hypertension: Secondary | ICD-10-CM | POA: Diagnosis not present

## 2017-05-15 DIAGNOSIS — Z7982 Long term (current) use of aspirin: Secondary | ICD-10-CM | POA: Diagnosis not present

## 2017-05-15 DIAGNOSIS — Z79890 Hormone replacement therapy (postmenopausal): Secondary | ICD-10-CM

## 2017-05-15 HISTORY — DX: Presence of right artificial knee joint: Z96.651

## 2017-05-15 HISTORY — PX: TOTAL KNEE ARTHROPLASTY: SHX125

## 2017-05-15 LAB — CBC WITH DIFFERENTIAL/PLATELET
BASOS ABS: 0 10*3/uL (ref 0.0–0.1)
BASOS PCT: 1 %
EOS ABS: 0.3 10*3/uL (ref 0.0–0.7)
EOS PCT: 6 %
HCT: 42.1 % (ref 39.0–52.0)
Hemoglobin: 13.9 g/dL (ref 13.0–17.0)
LYMPHS PCT: 37 %
Lymphs Abs: 1.8 10*3/uL (ref 0.7–4.0)
MCH: 31.2 pg (ref 26.0–34.0)
MCHC: 33 g/dL (ref 30.0–36.0)
MCV: 94.6 fL (ref 78.0–100.0)
MONO ABS: 0.4 10*3/uL (ref 0.1–1.0)
Monocytes Relative: 9 %
Neutro Abs: 2.4 10*3/uL (ref 1.7–7.7)
Neutrophils Relative %: 47 %
Platelets: 188 10*3/uL (ref 150–400)
RBC: 4.45 MIL/uL (ref 4.22–5.81)
RDW: 13.9 % (ref 11.5–15.5)
WBC: 4.9 10*3/uL (ref 4.0–10.5)

## 2017-05-15 LAB — URINALYSIS, ROUTINE W REFLEX MICROSCOPIC
BILIRUBIN URINE: NEGATIVE
GLUCOSE, UA: NEGATIVE mg/dL
HGB URINE DIPSTICK: NEGATIVE
Ketones, ur: NEGATIVE mg/dL
Leukocytes, UA: NEGATIVE
Nitrite: NEGATIVE
Protein, ur: NEGATIVE mg/dL
SPECIFIC GRAVITY, URINE: 1.011 (ref 1.005–1.030)
pH: 7 (ref 5.0–8.0)

## 2017-05-15 LAB — COMPREHENSIVE METABOLIC PANEL
ALT: 19 U/L (ref 17–63)
AST: 27 U/L (ref 15–41)
Albumin: 3.9 g/dL (ref 3.5–5.0)
Alkaline Phosphatase: 63 U/L (ref 38–126)
Anion gap: 5 (ref 5–15)
BUN: 13 mg/dL (ref 6–20)
CHLORIDE: 105 mmol/L (ref 101–111)
CO2: 26 mmol/L (ref 22–32)
Calcium: 9.3 mg/dL (ref 8.9–10.3)
Creatinine, Ser: 1.43 mg/dL — ABNORMAL HIGH (ref 0.61–1.24)
GFR calc Af Amer: 60 mL/min — ABNORMAL LOW (ref >60)
GFR, EST NON AFRICAN AMERICAN: 51 mL/min — AB (ref >60)
Glucose, Bld: 152 mg/dL — ABNORMAL HIGH (ref 65–99)
POTASSIUM: 4.1 mmol/L (ref 3.5–5.1)
SODIUM: 136 mmol/L (ref 135–145)
Total Bilirubin: 0.5 mg/dL (ref 0.3–1.2)
Total Protein: 7.9 g/dL (ref 6.5–8.1)

## 2017-05-15 LAB — PROTIME-INR
INR: 0.98
PROTHROMBIN TIME: 12.9 s (ref 11.4–15.2)

## 2017-05-15 LAB — GLUCOSE, CAPILLARY
Glucose-Capillary: 152 mg/dL — ABNORMAL HIGH (ref 65–99)
Glucose-Capillary: 147 mg/dL — ABNORMAL HIGH (ref 65–99)
Glucose-Capillary: 222 mg/dL — ABNORMAL HIGH (ref 65–99)

## 2017-05-15 LAB — C-REACTIVE PROTEIN

## 2017-05-15 LAB — SEDIMENTATION RATE: SED RATE: 15 mm/h (ref 0–16)

## 2017-05-15 LAB — APTT: APTT: 29 s (ref 24–36)

## 2017-05-15 SURGERY — ARTHROPLASTY, KNEE, TOTAL
Anesthesia: General | Site: Knee | Laterality: Left

## 2017-05-15 MED ORDER — METOCLOPRAMIDE HCL 5 MG PO TABS: 5.0000 mg | ORAL_TABLET | Freq: Three times a day (TID) | ORAL | Status: DC | PRN

## 2017-05-15 MED ORDER — METHOCARBAMOL 500 MG PO TABS
ORAL_TABLET | ORAL | Status: AC
  Administered 2017-05-15: 500 mg via ORAL
  Filled 2017-05-15: qty 1

## 2017-05-15 MED ORDER — CEFAZOLIN SODIUM-DEXTROSE 2-3 GM-%(50ML) IV SOLR
INTRAVENOUS | Status: DC | PRN
  Administered 2017-05-15: 2 g via INTRAVENOUS

## 2017-05-15 MED ORDER — CLINDAMYCIN PHOSPHATE 900 MG/50ML IV SOLN
900.0000 mg | INTRAVENOUS | Status: AC
  Administered 2017-05-15: 900 mg via INTRAVENOUS
  Filled 2017-05-15: qty 50

## 2017-05-15 MED ORDER — ASPIRIN EC 325 MG PO TBEC
325.0000 mg | DELAYED_RELEASE_TABLET | Freq: Two times a day (BID) | ORAL | Status: DC
  Administered 2017-05-15 – 2017-05-17 (×4): 325 mg via ORAL
  Filled 2017-05-15 (×4): qty 1

## 2017-05-15 MED ORDER — CHLORHEXIDINE GLUCONATE 4 % EX LIQD: 60.0000 mL | Freq: Once | CUTANEOUS | Status: DC

## 2017-05-15 MED ORDER — ACETAMINOPHEN 650 MG RE SUPP: 650.0000 mg | RECTAL | Status: DC | PRN

## 2017-05-15 MED ORDER — MENTHOL 3 MG MT LOZG: 1.0000 | LOZENGE | OROMUCOSAL | Status: DC | PRN

## 2017-05-15 MED ORDER — TRANEXAMIC ACID 1000 MG/10ML IV SOLN
1000.0000 mg | Freq: Once | INTRAVENOUS | Status: AC
  Administered 2017-05-15: 1000 mg via INTRAVENOUS
  Filled 2017-05-15: qty 10

## 2017-05-15 MED ORDER — BUPIVACAINE LIPOSOME 1.3 % IJ SUSP
INTRAMUSCULAR | Status: DC | PRN
  Administered 2017-05-15: 20 mL

## 2017-05-15 MED ORDER — PHENOL 1.4 % MT LIQD: 1.0000 | OROMUCOSAL | Status: DC | PRN

## 2017-05-15 MED ORDER — METHOCARBAMOL 500 MG PO TABS
500.0000 mg | ORAL_TABLET | Freq: Four times a day (QID) | ORAL | Status: DC | PRN
  Administered 2017-05-17 (×2): 500 mg via ORAL
  Filled 2017-05-15 (×3): qty 1

## 2017-05-15 MED ORDER — KETOROLAC TROMETHAMINE 15 MG/ML IJ SOLN
30.0000 mg | Freq: Four times a day (QID) | INTRAMUSCULAR | Status: AC
  Administered 2017-05-15 – 2017-05-16 (×4): 30 mg via INTRAVENOUS
  Filled 2017-05-15 (×4): qty 2

## 2017-05-15 MED ORDER — ROCURONIUM BROMIDE 10 MG/ML (PF) SYRINGE
PREFILLED_SYRINGE | INTRAVENOUS | Status: AC
  Filled 2017-05-15: qty 10

## 2017-05-15 MED ORDER — DEXAMETHASONE SODIUM PHOSPHATE 10 MG/ML IJ SOLN
INTRAMUSCULAR | Status: AC
  Filled 2017-05-15: qty 5

## 2017-05-15 MED ORDER — ASPIRIN EC 325 MG PO TBEC: 325.0000 mg | DELAYED_RELEASE_TABLET | Freq: Two times a day (BID) | ORAL | Status: DC

## 2017-05-15 MED ORDER — PIOGLITAZONE HCL 30 MG PO TABS
30.0000 mg | ORAL_TABLET | Freq: Every day | ORAL | Status: DC
  Administered 2017-05-16 – 2017-05-17 (×2): 30 mg via ORAL
  Filled 2017-05-15 (×2): qty 1

## 2017-05-15 MED ORDER — PROPOFOL 10 MG/ML IV BOLUS
INTRAVENOUS | Status: DC | PRN
  Administered 2017-05-15: 175 mg via INTRAVENOUS

## 2017-05-15 MED ORDER — DIPHENHYDRAMINE HCL 12.5 MG/5ML PO ELIX: 25.0000 mg | ORAL_SOLUTION | ORAL | Status: DC | PRN

## 2017-05-15 MED ORDER — POLYETHYLENE GLYCOL 3350 17 G PO PACK: 17.0000 g | PACK | Freq: Every day | ORAL | Status: DC | PRN

## 2017-05-15 MED ORDER — MIDAZOLAM HCL 2 MG/2ML IJ SOLN
2.0000 mg | Freq: Once | INTRAMUSCULAR | Status: AC
  Administered 2017-05-15: 2 mg via INTRAVENOUS
  Filled 2017-05-15: qty 2

## 2017-05-15 MED ORDER — MORPHINE SULFATE (PF) 4 MG/ML IV SOLN: 1.0000 mg | INTRAVENOUS | Status: DC | PRN

## 2017-05-15 MED ORDER — LEVOTHYROXINE SODIUM 75 MCG PO TABS
75.0000 ug | ORAL_TABLET | Freq: Every day | ORAL | Status: DC
  Administered 2017-05-16 – 2017-05-17 (×2): 75 ug via ORAL
  Filled 2017-05-15 (×2): qty 1

## 2017-05-15 MED ORDER — SODIUM CHLORIDE 0.9 % IR SOLN
Status: DC | PRN
Start: 2017-05-15 — End: 2017-05-15
  Administered 2017-05-15: 1000 mL

## 2017-05-15 MED ORDER — HYDROMORPHONE HCL 1 MG/ML IJ SOLN
INTRAMUSCULAR | Status: AC
  Administered 2017-05-15: 0.5 mg via INTRAVENOUS
  Filled 2017-05-15: qty 1

## 2017-05-15 MED ORDER — SODIUM CHLORIDE 0.9 % IV SOLN
INTRAVENOUS | Status: DC
  Administered 2017-05-15: 18:00:00 via INTRAVENOUS

## 2017-05-15 MED ORDER — ALUM & MAG HYDROXIDE-SIMETH 200-200-20 MG/5ML PO SUSP
30.0000 mL | ORAL | Status: DC | PRN
Start: 2017-05-15 — End: 2017-05-17

## 2017-05-15 MED ORDER — PRAVASTATIN SODIUM 40 MG PO TABS
40.0000 mg | ORAL_TABLET | Freq: Every day | ORAL | Status: DC
  Administered 2017-05-15 – 2017-05-16 (×2): 40 mg via ORAL
  Filled 2017-05-15 (×2): qty 1

## 2017-05-15 MED ORDER — KETOROLAC TROMETHAMINE 15 MG/ML IJ SOLN: 30.0000 mg | Freq: Four times a day (QID) | INTRAMUSCULAR | Status: DC

## 2017-05-15 MED ORDER — SODIUM CHLORIDE 0.9 % IJ SOLN
INTRAMUSCULAR | Status: DC | PRN
  Administered 2017-05-15: 40 mL

## 2017-05-15 MED ORDER — SORBITOL 70 % SOLN: 30.0000 mL | Freq: Every day | Status: DC | PRN

## 2017-05-15 MED ORDER — LACTATED RINGERS IV SOLN
INTRAVENOUS | Status: DC | PRN
  Administered 2017-05-15 (×2): via INTRAVENOUS

## 2017-05-15 MED ORDER — DEXAMETHASONE SODIUM PHOSPHATE 10 MG/ML IJ SOLN
INTRAMUSCULAR | Status: DC | PRN
  Administered 2017-05-15: 10 mg via INTRAVENOUS

## 2017-05-15 MED ORDER — CEFAZOLIN SODIUM-DEXTROSE 2-4 GM/100ML-% IV SOLN
2.0000 g | Freq: Four times a day (QID) | INTRAVENOUS | Status: AC
  Administered 2017-05-15 – 2017-05-16 (×3): 2 g via INTRAVENOUS
  Filled 2017-05-15 (×4): qty 100

## 2017-05-15 MED ORDER — LIDOCAINE 2% (20 MG/ML) 5 ML SYRINGE
INTRAMUSCULAR | Status: AC
  Filled 2017-05-15: qty 15

## 2017-05-15 MED ORDER — ACETAMINOPHEN 500 MG PO TABS: 1000.0000 mg | ORAL_TABLET | Freq: Four times a day (QID) | ORAL | Status: DC

## 2017-05-15 MED ORDER — ACETAMINOPHEN 500 MG PO TABS
1000.0000 mg | ORAL_TABLET | Freq: Four times a day (QID) | ORAL | Status: AC
  Administered 2017-05-15 – 2017-05-16 (×4): 1000 mg via ORAL
  Filled 2017-05-15 (×4): qty 2

## 2017-05-15 MED ORDER — PHENYLEPHRINE HCL 10 MG/ML IJ SOLN
INTRAVENOUS | Status: DC | PRN
  Administered 2017-05-15: 25 ug/min via INTRAVENOUS

## 2017-05-15 MED ORDER — METHOCARBAMOL 500 MG PO TABS
500.0000 mg | ORAL_TABLET | Freq: Four times a day (QID) | ORAL | Status: DC | PRN
  Administered 2017-05-15: 500 mg via ORAL

## 2017-05-15 MED ORDER — MIDAZOLAM HCL 2 MG/2ML IJ SOLN
INTRAMUSCULAR | Status: AC
  Filled 2017-05-15: qty 2

## 2017-05-15 MED ORDER — ALLOPURINOL 100 MG PO TABS
200.0000 mg | ORAL_TABLET | Freq: Every day | ORAL | Status: DC
  Administered 2017-05-16 – 2017-05-17 (×2): 200 mg via ORAL
  Filled 2017-05-15 (×2): qty 2

## 2017-05-15 MED ORDER — ONDANSETRON HCL 4 MG PO TABS: 4.0000 mg | ORAL_TABLET | Freq: Four times a day (QID) | ORAL | Status: DC | PRN

## 2017-05-15 MED ORDER — METOCLOPRAMIDE HCL 5 MG/ML IJ SOLN: 5.0000 mg | Freq: Three times a day (TID) | INTRAMUSCULAR | Status: DC | PRN

## 2017-05-15 MED ORDER — FENTANYL CITRATE (PF) 250 MCG/5ML IJ SOLN
INTRAMUSCULAR | Status: AC
  Filled 2017-05-15: qty 5

## 2017-05-15 MED ORDER — OXYCODONE HCL ER 10 MG PO T12A: 10.0000 mg | EXTENDED_RELEASE_TABLET | Freq: Two times a day (BID) | ORAL | 0 refills | Status: DC

## 2017-05-15 MED ORDER — OXYCODONE HCL ER 15 MG PO T12A: 15.0000 mg | EXTENDED_RELEASE_TABLET | Freq: Two times a day (BID) | ORAL | Status: DC

## 2017-05-15 MED ORDER — ROCURONIUM BROMIDE 100 MG/10ML IV SOLN
INTRAVENOUS | Status: DC | PRN
  Administered 2017-05-15: 20 mg via INTRAVENOUS
  Administered 2017-05-15: 50 mg via INTRAVENOUS

## 2017-05-15 MED ORDER — PROPOFOL 10 MG/ML IV BOLUS
INTRAVENOUS | Status: AC
  Filled 2017-05-15: qty 20

## 2017-05-15 MED ORDER — AMLODIPINE BESYLATE 10 MG PO TABS
10.0000 mg | ORAL_TABLET | Freq: Every day | ORAL | Status: DC
Start: 2017-05-16 — End: 2017-05-17
  Administered 2017-05-16 – 2017-05-17 (×2): 10 mg via ORAL
  Filled 2017-05-15 (×2): qty 1

## 2017-05-15 MED ORDER — CEFAZOLIN SODIUM-DEXTROSE 2-4 GM/100ML-% IV SOLN: 2.0000 g | Freq: Four times a day (QID) | INTRAVENOUS | Status: DC

## 2017-05-15 MED ORDER — SENNOSIDES-DOCUSATE SODIUM 8.6-50 MG PO TABS: 1.0000 | ORAL_TABLET | Freq: Every evening | ORAL | 1 refills | Status: DC | PRN

## 2017-05-15 MED ORDER — ENALAPRIL MALEATE 20 MG PO TABS
40.0000 mg | ORAL_TABLET | Freq: Every day | ORAL | Status: DC
  Administered 2017-05-15 – 2017-05-17 (×3): 40 mg via ORAL
  Filled 2017-05-15: qty 8
  Filled 2017-05-15: qty 2
  Filled 2017-05-15: qty 8
  Filled 2017-05-15 (×2): qty 2
  Filled 2017-05-15: qty 8

## 2017-05-15 MED ORDER — OXYCODONE HCL 5 MG PO TABS
10.0000 mg | ORAL_TABLET | ORAL | Status: DC | PRN
  Administered 2017-05-15 – 2017-05-17 (×5): 10 mg via ORAL
  Filled 2017-05-15 (×4): qty 2

## 2017-05-15 MED ORDER — ASPIRIN EC 325 MG PO TBEC: 325.0000 mg | DELAYED_RELEASE_TABLET | Freq: Two times a day (BID) | ORAL | 0 refills | Status: DC

## 2017-05-15 MED ORDER — SUGAMMADEX SODIUM 200 MG/2ML IV SOLN
INTRAVENOUS | Status: DC | PRN
  Administered 2017-05-15: 200 mg via INTRAVENOUS

## 2017-05-15 MED ORDER — DOCUSATE SODIUM 100 MG PO CAPS
100.0000 mg | ORAL_CAPSULE | Freq: Every day | ORAL | Status: DC
  Administered 2017-05-17: 100 mg via ORAL
  Filled 2017-05-15 (×2): qty 1

## 2017-05-15 MED ORDER — MAGNESIUM CITRATE PO SOLN: 1.0000 | Freq: Once | ORAL | Status: DC | PRN

## 2017-05-15 MED ORDER — HYDROMORPHONE HCL 1 MG/ML IJ SOLN
0.2500 mg | INTRAMUSCULAR | Status: DC | PRN
  Administered 2017-05-15 (×3): 0.5 mg via INTRAVENOUS

## 2017-05-15 MED ORDER — DEXAMETHASONE SODIUM PHOSPHATE 10 MG/ML IJ SOLN
10.0000 mg | Freq: Once | INTRAMUSCULAR | Status: AC
  Administered 2017-05-16: 10 mg via INTRAVENOUS
  Filled 2017-05-15: qty 1

## 2017-05-15 MED ORDER — OXYCODONE HCL ER 15 MG PO T12A
15.0000 mg | EXTENDED_RELEASE_TABLET | Freq: Two times a day (BID) | ORAL | Status: DC
  Administered 2017-05-15 – 2017-05-17 (×4): 15 mg via ORAL
  Filled 2017-05-15 (×4): qty 1

## 2017-05-15 MED ORDER — SODIUM CHLORIDE 0.9 % IV SOLN
INTRAVENOUS | Status: DC | PRN
  Administered 2017-05-15: 1000 mg via INTRAVENOUS

## 2017-05-15 MED ORDER — PROMETHAZINE HCL 25 MG PO TABS: 25.0000 mg | ORAL_TABLET | Freq: Four times a day (QID) | ORAL | 1 refills | Status: DC | PRN

## 2017-05-15 MED ORDER — ONDANSETRON HCL 4 MG/2ML IJ SOLN: 4.0000 mg | Freq: Four times a day (QID) | INTRAMUSCULAR | Status: DC | PRN

## 2017-05-15 MED ORDER — ONDANSETRON HCL 4 MG/2ML IJ SOLN
INTRAMUSCULAR | Status: DC | PRN
  Administered 2017-05-15: 4 mg via INTRAVENOUS

## 2017-05-15 MED ORDER — ONDANSETRON HCL 4 MG/2ML IJ SOLN
INTRAMUSCULAR | Status: AC
  Filled 2017-05-15: qty 4

## 2017-05-15 MED ORDER — FENTANYL CITRATE (PF) 250 MCG/5ML IJ SOLN
INTRAMUSCULAR | Status: DC | PRN
  Administered 2017-05-15: 150 ug via INTRAVENOUS
  Administered 2017-05-15: 100 ug via INTRAVENOUS
  Administered 2017-05-15: 50 ug via INTRAVENOUS

## 2017-05-15 MED ORDER — TRANEXAMIC ACID 1000 MG/10ML IV SOLN
2000.0000 mg | Freq: Once | INTRAVENOUS | Status: DC
  Filled 2017-05-15: qty 20

## 2017-05-15 MED ORDER — ACETAMINOPHEN 325 MG PO TABS
650.0000 mg | ORAL_TABLET | ORAL | Status: DC | PRN
  Administered 2017-05-17: 650 mg via ORAL
  Filled 2017-05-15: qty 2

## 2017-05-15 MED ORDER — ALUM & MAG HYDROXIDE-SIMETH 200-200-20 MG/5ML PO SUSP: 30.0000 mL | ORAL | Status: DC | PRN

## 2017-05-15 MED ORDER — ONDANSETRON HCL 4 MG/2ML IJ SOLN
INTRAMUSCULAR | Status: AC
  Filled 2017-05-15: qty 6

## 2017-05-15 MED ORDER — 0.9 % SODIUM CHLORIDE (POUR BTL) OPTIME
TOPICAL | Status: DC | PRN
  Administered 2017-05-15: 1000 mL

## 2017-05-15 MED ORDER — OXYCODONE HCL 5 MG PO TABS: 5.0000 mg | ORAL_TABLET | ORAL | 0 refills | Status: DC | PRN

## 2017-05-15 MED ORDER — SODIUM CHLORIDE 0.9 % IR SOLN
Status: DC | PRN
  Administered 2017-05-15: 3000 mL

## 2017-05-15 MED ORDER — TIZANIDINE HCL 4 MG PO TABS: 4.0000 mg | ORAL_TABLET | Freq: Four times a day (QID) | ORAL | 2 refills | Status: DC | PRN

## 2017-05-15 MED ORDER — TRANEXAMIC ACID 1000 MG/10ML IV SOLN
INTRAVENOUS | Status: AC | PRN
  Administered 2017-05-15: 2000 mg via TOPICAL

## 2017-05-15 MED ORDER — ACETAMINOPHEN 325 MG PO TABS: 650.0000 mg | ORAL_TABLET | ORAL | Status: DC | PRN

## 2017-05-15 MED ORDER — TRANEXAMIC ACID 1000 MG/10ML IV SOLN: 1000.0000 mg | Freq: Once | INTRAVENOUS | Status: DC

## 2017-05-15 MED ORDER — METHOCARBAMOL 1000 MG/10ML IJ SOLN: 500.0000 mg | Freq: Four times a day (QID) | INTRAVENOUS | Status: DC | PRN

## 2017-05-15 MED ORDER — INSULIN ASPART 100 UNIT/ML ~~LOC~~ SOLN
0.0000 [IU] | Freq: Three times a day (TID) | SUBCUTANEOUS | Status: DC
  Administered 2017-05-16: 3 [IU] via SUBCUTANEOUS
  Administered 2017-05-16: 11 [IU] via SUBCUTANEOUS
  Administered 2017-05-16 – 2017-05-17 (×3): 3 [IU] via SUBCUTANEOUS

## 2017-05-15 MED ORDER — DEXAMETHASONE SODIUM PHOSPHATE 10 MG/ML IJ SOLN: 10.0000 mg | Freq: Once | INTRAMUSCULAR | Status: DC

## 2017-05-15 MED ORDER — ONDANSETRON HCL 4 MG PO TABS: 4.0000 mg | ORAL_TABLET | Freq: Three times a day (TID) | ORAL | 0 refills | Status: DC | PRN

## 2017-05-15 MED ORDER — MIDAZOLAM HCL 5 MG/5ML IJ SOLN
INTRAMUSCULAR | Status: DC | PRN
  Administered 2017-05-15: 2 mg via INTRAVENOUS

## 2017-05-15 MED ORDER — SUGAMMADEX SODIUM 200 MG/2ML IV SOLN
INTRAVENOUS | Status: AC
  Filled 2017-05-15: qty 2

## 2017-05-15 MED ORDER — CEFAZOLIN SODIUM-DEXTROSE 2-4 GM/100ML-% IV SOLN
INTRAVENOUS | Status: AC
  Filled 2017-05-15: qty 100

## 2017-05-15 MED ORDER — INSULIN ASPART 100 UNIT/ML ~~LOC~~ SOLN
0.0000 [IU] | Freq: Every day | SUBCUTANEOUS | Status: DC
  Administered 2017-05-15: 2 [IU] via SUBCUTANEOUS

## 2017-05-15 MED ORDER — OXYCODONE HCL 5 MG PO TABS: 5.0000 mg | ORAL_TABLET | ORAL | Status: DC | PRN

## 2017-05-15 MED ORDER — OXYCODONE HCL 5 MG PO TABS
ORAL_TABLET | ORAL | Status: AC
  Administered 2017-05-15: 10 mg via ORAL
  Filled 2017-05-15: qty 2

## 2017-05-15 MED ORDER — METHOCARBAMOL 1000 MG/10ML IJ SOLN
500.0000 mg | Freq: Four times a day (QID) | INTRAVENOUS | Status: DC | PRN
  Filled 2017-05-15: qty 5

## 2017-05-15 MED ORDER — FENTANYL CITRATE (PF) 100 MCG/2ML IJ SOLN
100.0000 ug | Freq: Once | INTRAMUSCULAR | Status: AC
  Administered 2017-05-15: 75 ug via INTRAVENOUS
  Filled 2017-05-15: qty 2

## 2017-05-15 MED ORDER — SODIUM CHLORIDE 0.9 % IV SOLN: INTRAVENOUS | Status: DC

## 2017-05-15 MED ORDER — LIDOCAINE HCL (CARDIAC) 20 MG/ML IV SOLN
INTRAVENOUS | Status: DC | PRN
  Administered 2017-05-15: 50 mg via INTRATRACHEAL

## 2017-05-15 SURGICAL SUPPLY — 72 items
ALCOHOL ISOPROPYL (RUBBING) (MISCELLANEOUS) ×2 IMPLANT
APL SKNCLS STERI-STRIP NONHPOA (GAUZE/BANDAGES/DRESSINGS) ×1
BAG DECANTER FOR FLEXI CONT (MISCELLANEOUS) ×2 IMPLANT
BANDAGE ACE 6X5 VEL STRL LF (GAUZE/BANDAGES/DRESSINGS) ×4 IMPLANT
BANDAGE ESMARK 6X9 LF (GAUZE/BANDAGES/DRESSINGS) ×1 IMPLANT
BENZOIN TINCTURE PRP APPL 2/3 (GAUZE/BANDAGES/DRESSINGS) ×2 IMPLANT
BLADE SAW SGTL 13.0X1.19X90.0M (BLADE) ×2 IMPLANT
BNDG CMPR 9X6 STRL LF SNTH (GAUZE/BANDAGES/DRESSINGS) ×1
BNDG CMPR MED 10X6 ELC LF (GAUZE/BANDAGES/DRESSINGS) ×1
BNDG ELASTIC 6X10 VLCR STRL LF (GAUZE/BANDAGES/DRESSINGS) ×1 IMPLANT
BNDG ESMARK 6X9 LF (GAUZE/BANDAGES/DRESSINGS) ×2
BOWL SMART MIX CTS (DISPOSABLE) ×2 IMPLANT
CAPT KNEE TOTAL 3 ×1 IMPLANT
CEMENT BONE REFOBACIN R1X40 US (Cement) ×2 IMPLANT
CLSR STERI-STRIP ANTIMIC 1/2X4 (GAUZE/BANDAGES/DRESSINGS) ×4 IMPLANT
COVER SURGICAL LIGHT HANDLE (MISCELLANEOUS) ×2 IMPLANT
CUFF TOURNIQUET SINGLE 34IN LL (TOURNIQUET CUFF) ×2 IMPLANT
CUFF TOURNIQUET SINGLE 44IN (TOURNIQUET CUFF) IMPLANT
DRAPE EXTREMITY T 121X128X90 (DRAPE) ×2 IMPLANT
DRAPE HALF SHEET 40X57 (DRAPES) ×2 IMPLANT
DRAPE INCISE IOBAN 66X45 STRL (DRAPES) IMPLANT
DRAPE ORTHO SPLIT 77X108 STRL (DRAPES) ×4
DRAPE SURG 17X11 SM STRL (DRAPES) ×4 IMPLANT
DRAPE SURG ORHT 6 SPLT 77X108 (DRAPES) ×2 IMPLANT
DRSG AQUACEL AG ADV 3.5X10 (GAUZE/BANDAGES/DRESSINGS) ×1 IMPLANT
DRSG AQUACEL AG ADV 3.5X14 (GAUZE/BANDAGES/DRESSINGS) ×2 IMPLANT
DURAPREP 26ML APPLICATOR (WOUND CARE) ×4 IMPLANT
ELECT CAUTERY BLADE 6.4 (BLADE) ×2 IMPLANT
ELECT REM PT RETURN 9FT ADLT (ELECTROSURGICAL) ×2
ELECTRODE REM PT RTRN 9FT ADLT (ELECTROSURGICAL) ×1 IMPLANT
GLOVE BIO SURGEON STRL SZ 6.5 (GLOVE) ×1 IMPLANT
GLOVE BIOGEL PI IND STRL 6.5 (GLOVE) IMPLANT
GLOVE BIOGEL PI INDICATOR 6.5 (GLOVE) ×2
GLOVE SKINSENSE NS SZ7.5 (GLOVE) ×1
GLOVE SKINSENSE STRL SZ7.5 (GLOVE) ×1 IMPLANT
GLOVE SURG SYN 7.5  E (GLOVE) ×4
GLOVE SURG SYN 7.5 E (GLOVE) ×4 IMPLANT
GLOVE SURG SYN 7.5 PF PI (GLOVE) ×4 IMPLANT
GOWN STRL REIN XL XLG (GOWN DISPOSABLE) ×2 IMPLANT
GOWN STRL REUS W/ TWL LRG LVL3 (GOWN DISPOSABLE) ×1 IMPLANT
GOWN STRL REUS W/TWL LRG LVL3 (GOWN DISPOSABLE) ×2
HANDPIECE INTERPULSE COAX TIP (DISPOSABLE) ×2
HOOD PEEL AWAY FLYTE STAYCOOL (MISCELLANEOUS) ×4 IMPLANT
KIT BASIN OR (CUSTOM PROCEDURE TRAY) ×2 IMPLANT
KIT ROOM TURNOVER OR (KITS) ×2 IMPLANT
MANIFOLD NEPTUNE II (INSTRUMENTS) ×2 IMPLANT
MARKER SKIN DUAL TIP RULER LAB (MISCELLANEOUS) ×2 IMPLANT
NDL SPNL 18GX3.5 QUINCKE PK (NEEDLE) ×1 IMPLANT
NEEDLE SPNL 18GX3.5 QUINCKE PK (NEEDLE) ×2 IMPLANT
NS IRRIG 1000ML POUR BTL (IV SOLUTION) ×2 IMPLANT
PACK TOTAL JOINT (CUSTOM PROCEDURE TRAY) ×2 IMPLANT
PAD ARMBOARD 7.5X6 YLW CONV (MISCELLANEOUS) ×4 IMPLANT
SAW OSC TIP CART 19.5X105X1.3 (SAW) ×2 IMPLANT
SEALER BIPOLAR AQUA 6.0 (INSTRUMENTS) ×1 IMPLANT
SET HNDPC FAN SPRY TIP SCT (DISPOSABLE) ×1 IMPLANT
STAPLER VISISTAT 35W (STAPLE) IMPLANT
STRIP CLOSURE SKIN 1/2X4 (GAUZE/BANDAGES/DRESSINGS) ×1 IMPLANT
SUCTION FRAZIER HANDLE 10FR (MISCELLANEOUS) ×1
SUCTION TUBE FRAZIER 10FR DISP (MISCELLANEOUS) ×1 IMPLANT
SUT ETHILON 2 0 FS 18 (SUTURE) IMPLANT
SUT MNCRL AB 4-0 PS2 18 (SUTURE) IMPLANT
SUT VIC AB 0 CT1 27 (SUTURE) ×4
SUT VIC AB 0 CT1 27XBRD ANBCTR (SUTURE) ×2 IMPLANT
SUT VIC AB 1 CTX 27 (SUTURE) ×7 IMPLANT
SUT VIC AB 2-0 CT1 27 (SUTURE) ×6
SUT VIC AB 2-0 CT1 TAPERPNT 27 (SUTURE) ×3 IMPLANT
SYR 50ML LL SCALE MARK (SYRINGE) ×2 IMPLANT
TOWEL OR 17X24 6PK STRL BLUE (TOWEL DISPOSABLE) ×2 IMPLANT
TOWEL OR 17X26 10 PK STRL BLUE (TOWEL DISPOSABLE) ×2 IMPLANT
TRAY CATH 16FR W/PLASTIC CATH (SET/KITS/TRAYS/PACK) IMPLANT
UNDERPAD 30X30 (UNDERPADS AND DIAPERS) ×2 IMPLANT
WRAP KNEE MAXI GEL POST OP (GAUZE/BANDAGES/DRESSINGS) ×2 IMPLANT

## 2017-05-15 NOTE — Op Note (Signed)
Total Knee Arthroplasty Procedure Note  Preoperative diagnosis: Left knee osteoarthritis  Postoperative diagnosis:same  Operative procedure: Left total knee arthroplasty. CPT [phone]  Surgeon: N. Eduard Roux, MD  Assistants: Ky Barban, RNFA  Anesthesia: general, regional  Tourniquet time: 47 mins  Implants used: [employer] Femur: 5 PS Tibia: 5 Patella: 35 mm, 7.5 thick Polyethylene: 9 mm  Indication: Andrew [name] is a 61 y.o. year old male with a history of knee pain. Having failed conservative management, the patient elected to proceed with a total knee arthroplasty.  We have reviewed the risk and benefits of the surgery and they elected to proceed after voicing understanding.  Procedure:  After informed consent was obtained and understanding of the risk were voiced including but not limited to bleeding, infection, damage to surrounding structures including nerves and vessels, blood clots, leg length inequality and the failure to achieve desired results, the operative extremity was marked with verbal confirmation of the patient in the holding area.   The patient was then brought to the operating room and transported to the operating room table in the supine position.  A tourniquet was applied to the operative extremity around the upper thigh. The operative limb was then prepped and draped in the usual sterile fashion and preoperative antibiotics were administered.  A time out was performed prior to the start of surgery confirming the correct extremity, preoperative antibiotic administration, as well as team members, implants and instruments available for the case. Correct surgical site was also confirmed with preoperative radiographs. The limb was then elevated for exsanguination and the tourniquet was inflated. A midline incision was made and a standard medial parapatellar approach was performed.  The patella was prepared and sized to a 35 mm.  A cover was  placed on the patella for protection from retractors.  We then turned our attention to the femur. Posterior cruciate ligament was sacrificed. Start site was drilled in the femur and the intramedullary distal femoral cutting guide was placed, set at 5 degrees valgus, taking 9 mm of distal resection. The distal cut was made. Osteophytes were then removed. Next, the proximal tibial cutting guide was placed with appropriate slope, varus/valgus alignment and depth of resection. The proximal tibial cut was made. Gap blocks were then used to assess the extension gap and alignment, and appropriate soft tissue releases were performed. Attention was turned back to the femur, which was sized using the sizing guide to a size 5. Appropriate rotation of the femoral component was determined using epicondylar axis, Whiteside's line, and assessing the flexion gap under ligament tension. The appropriate size 4-in-1 cutting block was placed and cuts were made. Posterior femoral osteophytes and uncapped bone were then removed with the curved osteotome. The tibia was sized for a size 5 component. The femoral box-cutting guide was placed and prepared for a PS femoral component. Trial components were placed, and stability was checked in full extension, mid-flexion, and deep flexion. Proper tibial rotation was determined and marked.  The patella tracked well without a lateral release. Trial components were then removed and tibial preparation performed. A posterior capsular injection comprising of 20 cc of 1.3% exparel and 40 cc of normal saline was performed for postoperative pain control. The bony surfaces were irrigated with a pulse lavage and then dried. Bone cement was vacuum mixed on the back table, and the final components sized above were cemented into place. After cement had finished curing, excess cement was removed. The stability of the construct  was re-evaluated throughout a range of motion and found to be acceptable. The  trial liner was removed, the knee was copiously irrigated, and the knee was re-evaluated for any excess bone debris. The real polyethylene liner, 9 mm thick, was inserted and checked to ensure the locking mechanism had engaged appropriately. The tourniquet was deflated and hemostasis was achieved. The wound was irrigated with dilute betadine in normal saline, and then again with normal saline. A drain was not placed. Capsular closure was performed with a #1 vicryl, subcutaneous fat closed with a 2.0 vicryl suture, then subcutaneous tissue closed with interrupted 2.0 vicryl suture. The skin was then closed with a 3.0 monocryl. A sterile dressing was applied.   The patient was awakened in the operating room and taken to recovery in stable condition. All sponge, needle, and instrument counts were correct at the end of the case.  Position: supine  Complications: none.  Time Out: performed   Drains/Packing: none  Estimated blood loss: 75 cc  Returned to Recovery Room: in good condition.   Antibiotics: yes   Mechanical VTE (DVT) Prophylaxis: sequential compression devices, TED thigh-high  Chemical VTE (DVT) Prophylaxis: aspirin  Fluid Replacement  Crystalloid: see anesthesia record Blood: none  FFP: none   Specimens Removed: 1 to pathology   Sponge and Instrument Count Correct? yes   PACU: portable radiograph - knee AP and Lateral   Admission: inpatient status  Plan/RTC: Return in 2 weeks for wound check.   Weight Bearing/Load Lower Extremity: full   N. Eduard Roux, MD [place] 2:51 PM

## 2017-05-15 NOTE — H&P (Signed)
PREOPERATIVE H&P  Chief Complaint: Left knee degenerative joint disease  HPI: Andrew [name] is a 61 y.o. male who presents for surgical treatment of Left knee degenerative joint disease.  He denies any changes in medical history.  Past Medical History:  Diagnosis Date  . Arthritis   . CRD (chronic renal disease), stage II    Baseline Cr 1.2  . 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 HAMMER TOE 07/25/2010  . HLD (hyperlipidemia)   . HTN (hypertension)   . Hypothyroidism   . Leg swelling   . Lipoma of back 03/18/2011  . Personal history of colonic adenomas 04/01/2013  . Prostatitis    hx of x2  . PSEUDOFOLLICULITIS BARBAE 09/14/1827   Qualifier: Diagnosis of  By: Danise Mina  MD, Garlon Hatchet    . Sarcoidosis   . T2DM (type 2 diabetes mellitus) ([place])    Past Surgical History:  Procedure Laterality Date  . COLONOSCOPY W/ POLYPECTOMY    . ERCP  2006   Normal  . [place]   x2 'inguinal  . TRANSTHORACIC ECHOCARDIOGRAM  2005   EF 60%   Social History   Socioeconomic History  . Marital status: Single    Spouse name: Not on file  . Number of children: Not on file  . Years of education: Not on file  . Highest education level: Not on file  Social Needs  . Financial resource strain: Not on file  . Food insecurity - worry: Not on file  . Food insecurity - inability: Not on file  . Transportation needs - medical: Not on file  . Transportation needs - non-medical: Not on file  Occupational History  . Not on file  Tobacco Use  . Smoking status: Never Smoker  . Smokeless tobacco: Never Used  Substance and Sexual Activity  . Alcohol use: No    Alcohol/week: 0.6 oz    Types: 1 Glasses of wine per week  . Drug use: No  . Sexual activity: Not Currently  Other Topics Concern  . Not on file  Social History Narrative   Is a retired Social worker at [employer]. Remote etoh and tobacco history, stopped 20  years ago. 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  drinks socially currently.  much family in [place].  when got sick unable to pay for child support so license was suspended.  in process of getting license back.    Family History  Problem Relation Age of Onset  . Bone cancer Father   . Cancer Father 47       bone  . Uterine cancer Mother   . Cancer Mother   . Heart attack Brother   . Kidney disease Brother   . Diabetes Brother   . Arthritis Brother   . Heart disease Sister   . Congestive Heart Failure Sister   . Congestive Heart Failure Sister   . Congestive Heart Failure Sister   . Diabetes Sister   . Arthritis Sister   . Colon cancer Neg Hx   . Rectal cancer Neg Hx   . Stomach cancer Neg Hx    Allergies  Allergen Reactions  . Metoprolol Succinate     REACTION: bradycardia (HR to 42)  . Penicillins Itching    Has patient had a PCN reaction causing immediate rash, facial/tongue/throat swelling, SOB or lightheadedness with hypotension: No Has patient had a PCN reaction  causing severe rash involving mucus membranes or skin necrosis: No Has patient had a PCN reaction that required hospitalization No Has patient had a PCN reaction occurring within the last 10 years: No If all of the above answers are "NO", then may proceed with Cephalosporin use.   Prior to Admission medications   Medication Sig Start Date End Date Taking? Authorizing Provider  acetaminophen (TYLENOL) 650 MG CR tablet Take 650 mg by mouth daily as needed for pain.   Yes [provider]  allopurinol (ZYLOPRIM) 100 MG tablet Take 2 tablets (200 mg total) by mouth daily. 04/30/17  Yes Rogue Bussing, MD  amLODipine (NORVASC) 10 MG tablet Take 1 tablet (10 mg total) by mouth daily. 01/28/17  Yes Rogue Bussing, MD  aspirin 81 MG chewable tablet CHEW AND SWALLOW 1 TABLET EVERY DAY. Patient taking differently: Chew 81 mg by mouth daily.  01/28/17  Yes Rogue Bussing, MD  docusate sodium (COLACE) 100 MG capsule Take 100 mg by mouth daily.   Yes [provider]  enalapril (VASOTEC) 20 MG tablet Take 2 tablets (40 mg total) by mouth daily. 01/28/17  Yes Rogue Bussing, MD  levothyroxine (SYNTHROID, LEVOTHROID) 75 MCG tablet Take 1 tablet (75 mcg total) by mouth daily. 01/28/17  Yes Rogue Bussing, MD  lovastatin (MEVACOR) 40 MG tablet Take 1 tablet (40 mg total) by mouth at bedtime. 01/28/17  Yes Rogue Bussing, MD  pioglitazone (ACTOS) 30 MG tablet Take 1 tablet (30 mg total) by mouth daily. 01/28/17  Yes Rogue Bussing, MD  traMADol (ULTRAM) 50 MG tablet Take 1 tablet (50 mg total) by mouth every 8 (eight) hours as needed. Patient taking differently: Take 50 mg by mouth daily as needed for moderate pain.  04/29/17  Yes Rogue Bussing, MD  Blood Glucose Monitoring Suppl (BLOOD GLUCOSE METER) kit Use to test fasting glucose daily.  Diagnosis Type II diabetes 250.0 04/14/12   Luetta Nutting, DO  cyclobenzaprine (FLEXERIL) 10 MG tablet Take 1 tablet (10 mg total) by mouth 3 (three) times daily as needed for muscle spasms. Patient not taking: Reported on 05/06/2017 01/24/17   Rogue Bussing, MD  glucose blood (TRUE METRIX BLOOD GLUCOSE TEST) test strip USE TO TEST BLOOD SUGAR TWICE DAILY FOR TYPE II  DIABETES 01/28/17   Rogue Bussing, MD  mupirocin ointment [facility]) 2 % 1 application to the right great toe twice daily Patient not taking: Reported on 05/06/2017 07/31/16   Archie Patten, MD  PRODIGY LANCETS 26G MISC Use to check fasting blood glucose each morning.  Diagnosis: Diabetes Mellitus Type II 250.00 03/30/12   Luetta Nutting, DO  traZODone (DESYREL) 100 MG tablet Take 1 tablet (100 mg total) by mouth at bedtime. Patient not taking: Reported on 05/06/2017 01/25/17   Rogue Bussing, MD  traZODone (DESYREL) 100 MG tablet Take 1 tablet (100 mg total) by mouth at  bedtime. Patient not taking: Reported on 05/06/2017 01/28/17   Rogue Bussing, MD  triamcinolone cream (KENALOG) 0.1 % Apply 1 application topically 2 (two) times daily. Patient not taking: Reported on 05/06/2017 12/19/15   Archie Patten, MD     Positive ROS: All other systems have been reviewed and were otherwise negative with the exception of those mentioned in the HPI and as above.  Physical Exam: General: Alert, no acute distress Cardiovascular: No pedal edema Respiratory: No cyanosis, no use of accessory musculature GI: abdomen soft Skin: No lesions in  the area of chief complaint Neurologic: Sensation intact distally Psychiatric: Patient is competent for consent with normal mood and affect Lymphatic: no lymphedema  MUSCULOSKELETAL: exam stable  Assessment: Left knee degenerative joint disease  Plan: Plan for Procedure(s): LEFT TOTAL KNEE ARTHROPLASTY  The risks benefits and alternatives were discussed with the patient including but not limited to the risks of nonoperative treatment, versus surgical intervention including infection, bleeding, nerve injury,  blood clots, cardiopulmonary complications, morbidity, mortality, among others, and they were willing to proceed.   Eduard Roux, MD   05/15/2017 10:00 AM

## 2017-05-15 NOTE — Discharge Instructions (Signed)

## 2017-05-15 NOTE — Anesthesia Preprocedure Evaluation (Signed)
Anesthesia Evaluation  Patient identified by MRN, date of birth, ID band  Reviewed: Allergy & Precautions, NPO status , Patient's Chart, lab work & pertinent test results  Airway Mallampati: II  TM Distance: >3 FB     Dental   Pulmonary neg pulmonary ROS,    breath sounds clear to auscultation       Cardiovascular hypertension,  Rhythm:Regular Rate:Normal     Neuro/Psych    GI/Hepatic negative GI ROS, Neg liver ROS,   Endo/Other  diabetesHypothyroidism   Renal/GU Renal disease     Musculoskeletal   Abdominal   Peds  Hematology   Anesthesia Other Findings   Reproductive/Obstetrics                             Anesthesia Physical Anesthesia Plan  ASA: III  Anesthesia Plan: General   Post-op Pain Management:  Regional for Post-op pain   Induction:   PONV Risk Score and Plan: 2 and Ondansetron, Dexamethasone and Treatment may vary due to age or medical condition  Airway Management Planned: Oral ETT  Additional Equipment:   Intra-op Plan:   Post-operative Plan: Extubation in OR  Informed Consent: I have reviewed the patients History and Physical, chart, labs and discussed the procedure including the risks, benefits and alternatives for the proposed anesthesia with the patient or authorized representative who has indicated his/her understanding and acceptance.     Plan Discussed with: Anesthesiologist, CRNA and Surgeon  Anesthesia Plan Comments:         Anesthesia Quick Evaluation

## 2017-05-15 NOTE — Transfer of Care (Signed)
Immediate Anesthesia Transfer of Care Note  Patient: Andrew [name]  Procedure(s) Performed: LEFT TOTAL KNEE ARTHROPLASTY (Left Knee)  Patient Location: PACU  Anesthesia Type:GA combined with regional for post-op pain  Level of Consciousness: alert , oriented, drowsy and patient cooperative  Airway & Oxygen Therapy: Patient Spontanous Breathing and Patient connected to nasal cannula oxygen  Post-op Assessment: Report given to RN and Post -op Vital signs reviewed and stable  Post vital signs: Reviewed and stable  Last Vitals:  Vitals:   05/15/17 1108  BP: (!) 162/70  Pulse: 60  Resp: 18  Temp: 36.9 C  SpO2: 100%    Last Pain:  Vitals:   05/15/17 1108  TempSrc: Oral  PainSc: 0-No pain      Patients Stated Pain Goal: 0 (75/05/18 3358)  Complications: No apparent anesthesia complications

## 2017-05-15 NOTE — Progress Notes (Signed)
Report given to erika rn as caregiver 

## 2017-05-15 NOTE — Progress Notes (Signed)
Orthopedic Tech Progress Note Patient Details:  Andrew [name] 1955/12/03 [id number]  CPM Left Knee CPM Left Knee: On Left Knee Flexion (Degrees): 90 Left Knee Extension (Degrees): 0   Andrew [name] 05/15/2017, 4:41 PM Trapeze bar patient helper not applied becaust pt' s weight exceeds durability of frame; RN notified

## 2017-05-15 NOTE — Anesthesia Procedure Notes (Signed)
Procedure Name: Intubation Date/Time: 05/15/2017 1:17 PM Performed by: Mariea Clonts, CRNA Pre-anesthesia Checklist: Patient identified, Emergency Drugs available, Suction available and Patient being monitored Patient Re-evaluated:Patient Re-evaluated prior to induction Oxygen Delivery Method: Circle System Utilized Preoxygenation: Pre-oxygenation with 100% oxygen Induction Type: IV induction Ventilation: Mask ventilation without difficulty Laryngoscope Size: Miller and 3 Grade View: Grade I Tube type: Oral Tube size: 8.0 mm Number of attempts: 1 Airway Equipment and Method: Stylet and Oral airway Placement Confirmation: ETT inserted through vocal cords under direct vision,  positive ETCO2 and breath sounds checked- equal and bilateral Tube secured with: Tape Dental Injury: Teeth and Oropharynx as per pre-operative assessment

## 2017-05-16 ENCOUNTER — Encounter (HOSPITAL_COMMUNITY): Payer: Self-pay | Admitting: Orthopaedic Surgery

## 2017-05-16 LAB — CBC
HCT: 37.6 % — ABNORMAL LOW (ref 39.0–52.0)
HEMOGLOBIN: 12.4 g/dL — AB (ref 13.0–17.0)
MCH: 31 pg (ref 26.0–34.0)
MCHC: 33 g/dL (ref 30.0–36.0)
MCV: 94 fL (ref 78.0–100.0)
PLATELETS: 182 10*3/uL (ref 150–400)
RBC: 4 MIL/uL — AB (ref 4.22–5.81)
RDW: 13.5 % (ref 11.5–15.5)
WBC: 7.9 10*3/uL (ref 4.0–10.5)

## 2017-05-16 LAB — BASIC METABOLIC PANEL
Anion gap: 4 — ABNORMAL LOW (ref 5–15)
BUN: 13 mg/dL (ref 6–20)
CO2: 25 mmol/L (ref 22–32)
CREATININE: 1.39 mg/dL — AB (ref 0.61–1.24)
Calcium: 8.4 mg/dL — ABNORMAL LOW (ref 8.9–10.3)
Chloride: 103 mmol/L (ref 101–111)
GFR, EST NON AFRICAN AMERICAN: 53 mL/min — AB (ref >60)
Glucose, Bld: 217 mg/dL — ABNORMAL HIGH (ref 65–99)
Potassium: 4.4 mmol/L (ref 3.5–5.1)
SODIUM: 132 mmol/L — AB (ref 135–145)

## 2017-05-16 LAB — GLUCOSE, CAPILLARY
GLUCOSE-CAPILLARY: 179 mg/dL — AB (ref 65–99)
GLUCOSE-CAPILLARY: 177 mg/dL — AB (ref 65–99)
GLUCOSE-CAPILLARY: 302 mg/dL — AB (ref 65–99)
GLUCOSE-CAPILLARY: 178 mg/dL — AB (ref 65–99)

## 2017-05-16 NOTE — Evaluation (Signed)
Occupational Therapy Evaluation Patient Details Name: Andrew [name] MRN: [id number] DOB: 1955/11/10 Today's Date: 05/16/2017    History of Present Illness Pt is a 61 y/o male who presents s/p L TKA on 05/15/17. 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    Clinical Impression   Pt with decline in function and safety with ADLs and ADL mobility with decreased balance and endurance. Pt would benefit from acute OT services to address impairments to maximize level of function and safety    Follow Up Recommendations  Home health OT    Equipment Recommendations  3 in 1 bedside commode;Tub/shower seat;Other (comment)(reacher)    Recommendations for Other Services       Precautions / Restrictions Precautions Precautions: Fall Precaution Comments: educated no pillow under L knee Restrictions Weight Bearing Restrictions: Yes LLE Weight Bearing: Weight bearing as tolerated      Mobility Bed Mobility Overal bed mobility: Needs Assistance Bed Mobility: Supine to Sit     Supine to sit: Supervision     General bed mobility comments: pt OOB in recliner upon OT arrival  Transfers Overall transfer level: Needs assistance Equipment used: Rolling walker (2 wheeled) Transfers: Sit to/from Stand Sit to Stand: Min guard         General transfer comment: no physical assist    Balance Overall balance assessment: Needs assistance Sitting-balance support: Feet supported Sitting balance-Leahy Scale: Fair     Standing balance support: Bilateral upper extremity supported;During functional activity Standing balance-Leahy Scale: Poor Standing balance comment: Reliant on UE support at this time.                            ADL either performed or assessed with clinical judgement   ADL Overall ADL's : Needs assistance/impaired     Grooming: Wash/dry hands;Wash/dry face;Standing;Min guard   Upper Body Bathing: Set up;Sitting;Supervision/ safety   Lower Body  Bathing: Minimal assistance   Upper Body Dressing : Set up;Supervision/safety;Sitting   Lower Body Dressing: Minimal assistance   Toilet Transfer: Min guard;RW;Comfort height toilet;Grab bars;Ambulation Toilet Transfer Details (indicate cue type and reason): 3 in 1 Toileting- Clothing Manipulation and Hygiene: Min guard;Sit to/from stand   Tub/ Shower Transfer: 3 in 1;Min guard;Ambulation;Rolling walker;Grab bars   Functional mobility during ADLs: Min guard;Rolling walker General ADL Comments: pt educated on Equities trader, reacher and 3 in 1 for home use     Vision Baseline Vision/History: Wears glasses Wears Glasses: At all times Patient Visual Report: No change from baseline       Perception     Praxis      Pertinent Vitals/Pain Pain Assessment: No/denies pain     Hand Dominance Right   Extremity/Trunk Assessment Upper Extremity Assessment Upper Extremity Assessment: Overall WFL for tasks assessed   Lower Extremity Assessment Lower Extremity Assessment: Defer to PT evaluation RLE Deficits / Details: Residual weakness from prior strokes LLE Deficits / Details: Decreased strength and AROM consistent with above mentioned procedure.    Cervical / Trunk Assessment Cervical / Trunk Assessment: Normal   Communication Communication Communication: No difficulties   Cognition Arousal/Alertness: Awake/alert Behavior During Therapy: WFL for tasks assessed/performed Overall Cognitive Status: Within Functional Limits for tasks assessed                                     General Comments   pt pleasant and  cooperative, talkative    Exercises    Shoulder Instructions      Home Living Family/patient expects to be discharged to:: Private residence Living Arrangements: Children Available Help at Discharge: Family;Friend(s);Available PRN/intermittently Type of Home: House Home Access: Stairs to enter [employer] of Steps: 3   Home Layout:  One level     Bathroom Shower/Tub: Teacher, early years/pre: Standard     Home Equipment: None          Prior Functioning/Environment Level of Independence: Independent                 OT Problem List: Decreased activity tolerance;Decreased knowledge of use of DME or AE;Impaired balance (sitting and/or standing)      OT Treatment/Interventions: Self-care/ADL training;DME and/or AE instruction;Therapeutic activities;Therapeutic exercise;Patient/family education    OT Goals(Current goals can be found in the care plan section) Acute Rehab OT Goals Patient Stated Goal: Return home at d/c OT Goal Formulation: With patient Time For Goal Achievement: 05/23/17 Potential to Achieve Goals: Good ADL Goals Pt Will Perform Grooming: with set-up;with supervision;standing Pt Will Perform Upper Body Bathing: with set-up;standing;sitting Pt Will Perform Lower Body Bathing: with min guard assist;with supervision;with set-up;sit to/from stand Pt Will Perform Upper Body Dressing: with set-up;sitting;standing Pt Will Perform Lower Body Dressing: sitting/lateral leans;sit to/from stand;with min guard assist;with supervision;with set-up Pt Will Transfer to Toilet: with supervision;ambulating;regular height toilet;grab bars Pt Will Perform Toileting - Clothing Manipulation and hygiene: with supervision;sit to/from stand Pt Will Perform Tub/Shower Transfer: with supervision;shower seat;3 in 1;rolling walker;grab bars;ambulating  OT Frequency: Min 2X/week   Barriers to D/C:    no barriers       Co-evaluation              AM-PAC PT "6 Clicks" Daily Activity     Outcome Measure Help from another person eating meals?: None Help from another person taking care of personal grooming?: A Little Help from another person toileting, which includes using toliet, bedpan, or urinal?: A Little Help from another person bathing (including washing, rinsing, drying)?: A Little Help from  another person to put on and taking off regular upper body clothing?: A Little Help from another person to put on and taking off regular lower body clothing?: A Little 6 Click Score: 19   End of Session Equipment Utilized During Treatment: Gait belt;Rolling walker;Other (comment)(3 in 1) CPM Left Knee CPM Left Knee: Off  Activity Tolerance: Patient tolerated treatment well Patient left: in chair;with call bell/phone within reach  OT Visit Diagnosis: Unsteadiness on feet (R26.81);Pain;Other abnormalities of gait and mobility (R26.89)                Time: 2023-3435 OT Time Calculation (min): 44 min Charges:  OT General Charges $OT Visit: 1 Visit OT Evaluation $OT Eval Moderate Complexity: 1 Mod OT Treatments $Self Care/Home Management : 8-22 mins $Therapeutic Activity: 8-22 mins G-Codes: OT G-codes **NOT FOR INPATIENT CLASS** Functional Assessment Tool Used: AM-PAC 6 Clicks Daily Activity     Britt Bottom 05/16/2017, 1:46 PM

## 2017-05-16 NOTE — Care Management Note (Signed)
Case Management Note  Patient Details  Name: Andrew [name] MRN: [id number] Date of Birth: [birth year]  Subjective/Objective:                 Spoke to patient at bedside. He is agreeable to use [facility] for [facility] PT after DC. He states he does not have a RW or a 3/1 and will need both prior to DC. CM will continue to follow.    Action/Plan:   Expected Discharge Date:                  Expected Discharge Plan:  [place]  In-House Referral:     Discharge planning Services  CM Consult  Post Acute Care Choice:  Durable Medical Equipment, Home Health Choice offered to:  Patient  DME Arranged:  3-N-1, Walker rolling DME Agency:     HH Arranged:  PT [place]:  [facility] (now [facility])  Status of Service:  In process, will continue to follow  If discussed at Long Length of Stay Meetings, dates discussed:    Additional Comments:  Carles Collet, RN 05/16/2017, 3:51 PM

## 2017-05-16 NOTE — Progress Notes (Signed)
Physical Therapy Treatment Patient Details Name: Kimani Hovis MRN: [id number] DOB: December 17, 1955 Today's Date: 05/16/2017    History of Present Illness Pt is a 61 y/o male who presents s/p L TKA on 05/15/17. 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     PT Comments    Pt progressing towards physical therapy goals. Pt motivated to work with PT and overall does not complain of pain. Will see once in the morning tomorrow and anticipate pt will be ready for d/c from a mobility standpoint.   Follow Up Recommendations  Home health PT;Supervision for mobility/OOB     Equipment Recommendations  Rolling walker with 5" wheels;3in1 (PT)    Recommendations for Other Services       Precautions / Restrictions Precautions Precautions: Fall Precaution Comments: educated no pillow under L knee Restrictions Weight Bearing Restrictions: Yes LLE Weight Bearing: Weight bearing as tolerated    Mobility  Bed Mobility Overal bed mobility: Needs Assistance Bed Mobility: Supine to Sit     Supine to sit: Supervision     General bed mobility comments: pt OOB in recliner upon OT arrival  Transfers Overall transfer level: Needs assistance Equipment used: Rolling walker (2 wheeled) Transfers: Sit to/from Stand Sit to Stand: Min guard         General transfer comment: no physical assist  Ambulation/Gait Ambulation/Gait assistance: Min guard Ambulation Distance (Feet): 250 Feet Assistive device: Rolling walker (2 wheeled) Gait Pattern/deviations: Step-to pattern;Step-through pattern;Decreased stride length;Trunk flexed;Decreased dorsiflexion - left Gait velocity: Decreased Gait velocity interpretation: Below normal speed for age/gender General Gait Details: VC's for improved posture, increased heel strike, sequencing with the RW and step-through gait pattern. Pt was able to ambulate slow but steady and without complaints of pain.    Stairs Stairs: Yes   Stair Management: Two  rails;Step to pattern;Forwards Number of Stairs: 10(5x2) General stair comments: VC's for sequencing and general safety.   Wheelchair Mobility    Modified Rankin (Stroke Patients Only)       Balance Overall balance assessment: Needs assistance Sitting-balance support: Feet supported Sitting balance-Leahy Scale: Fair     Standing balance support: Bilateral upper extremity supported;During functional activity Standing balance-Leahy Scale: Poor Standing balance comment: Reliant on UE support at this time.                             Cognition Arousal/Alertness: Awake/alert Behavior During Therapy: WFL for tasks assessed/performed Overall Cognitive Status: Within Functional Limits for tasks assessed                                        Exercises Total Joint Exercises Ankle Circles/Pumps: 20 reps Quad Sets: 15 reps Heel Slides: 5 reps(to stretch) Hip ABduction/ADduction: 10 reps Straight Leg Raises: 10 reps    General Comments        Pertinent Vitals/Pain Pain Assessment: No/denies pain    Home Living Family/patient expects to be discharged to:: Private residence Living Arrangements: Children Available Help at Discharge: Family;Friend(s);Available PRN/intermittently Type of Home: House Home Access: Stairs to enter   Home Layout: One level Home Equipment: None      Prior Function Level of Independence: Independent          PT Goals (current goals can now be found in the care plan section) Acute Rehab PT Goals Patient Stated Goal: Return home  at d/c PT Goal Formulation: With patient Time For Goal Achievement: 05/23/17 Potential to Achieve Goals: Good Progress towards PT goals: Progressing toward goals    Frequency    7X/week      PT Plan Current plan remains appropriate    Co-evaluation              AM-PAC PT "6 Clicks" Daily Activity  Outcome Measure  Difficulty turning over in bed (including adjusting  bedclothes, sheets and blankets)?: None Difficulty moving from lying on back to sitting on the side of the bed? : None Difficulty sitting down on and standing up from a chair with arms (e.g., wheelchair, bedside commode, etc,.)?: None Help needed moving to and from a bed to chair (including a wheelchair)?: A Little Help needed walking in hospital room?: A Little Help needed climbing 3-5 steps with a railing? : A Little 6 Click Score: 21    End of Session Equipment Utilized During Treatment: Gait belt Activity Tolerance: Patient tolerated treatment well Patient left: in chair;with call bell/phone within reach;with chair alarm set Nurse Communication: Mobility status PT Visit Diagnosis: Unsteadiness on feet (R26.81);Pain;Difficulty in walking, not elsewhere classified (R26.2) Pain - Right/Left: Left Pain - part of body: Knee     Time: 2947-6546 PT Time Calculation (min) (ACUTE ONLY): 38 min  Charges:  $Gait Training: 23-37 mins $Therapeutic Exercise: 8-22 mins                    G Codes:       Rolinda Roan, PT, DPT Acute Rehabilitation Services Pager: [place] 05/16/2017, 3:47 PM

## 2017-05-16 NOTE — Evaluation (Signed)
Physical Therapy Evaluation Patient Details Name: Andrew [name] MRN: [id number] DOB: [birth year] Today's Date: 05/16/2017   History of Present Illness  Pt is a 61 y/o male who presents s/p L TKA on 05/15/17. 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   Clinical Impression  Pt admitted with above diagnosis. Pt currently with functional limitations due to the deficits listed below (see PT Problem List). At the time of PT eval pt was able to perform transfers and ambulation with min guard assist to supervision for safety with RW for support. Anticipate this patient will progress well with mobility. Was able to tolerate some therapeutic exercise with minimal complaints of pain, and achieved ~150' during gait training. Pt will benefit from skilled PT to increase their independence and safety with mobility to allow discharge to the venue listed below.       Follow Up Recommendations Home health PT;Supervision for mobility/OOB    Equipment Recommendations  Rolling walker with 5" wheels;3in1 (PT)    Recommendations for Other Services       Precautions / Restrictions Precautions Precautions: Fall Restrictions Weight Bearing Restrictions: Yes LLE Weight Bearing: Weight bearing as tolerated      Mobility  Bed Mobility Overal bed mobility: Needs Assistance Bed Mobility: Supine to Sit     Supine to sit: Supervision     General bed mobility comments: No assist to transition to EOB. Pt was able to advance LLE without difficulty.   Transfers Overall transfer level: Needs assistance Equipment used: Rolling walker (2 wheeled) Transfers: Sit to/from Stand Sit to Stand: Min guard         General transfer comment: Close guard as pt powered-up to full standing position. Pt was able to gain and maintain standing balance without assist.   Ambulation/Gait Ambulation/Gait assistance: Min guard Ambulation Distance (Feet): 150 Feet Assistive device: Rolling walker (2 wheeled) Gait  Pattern/deviations: Step-to pattern;Step-through pattern;Decreased stride length;Trunk flexed;Decreased dorsiflexion - left Gait velocity: Decreased Gait velocity interpretation: Below normal speed for age/gender General Gait Details: VC's for improved posture, increased heel strike, sequencing with the RW and step-through gait pattern. Pt was able to ambulate slow but steady and without complaints of pain.   Stairs            Wheelchair Mobility    Modified Rankin (Stroke Patients Only)       Balance Overall balance assessment: Needs assistance Sitting-balance support: Feet supported Sitting balance-Leahy Scale: Fair     Standing balance support: Bilateral upper extremity supported;During functional activity Standing balance-Leahy Scale: Poor Standing balance comment: Reliant on UE support at this time.                              Pertinent Vitals/Pain Pain Assessment: No/denies pain    Home Living Family/patient expects to be discharged to:: Private residence Living Arrangements: Children Available Help at Discharge: Family;Friend(s);Available PRN/intermittently Type of Home: House Home Access: Stairs to enter   [employer] of Steps: 3 Home Layout: One level Home Equipment: None      Prior Function Level of Independence: Independent               Hand Dominance   Dominant Hand: Right    Extremity/Trunk Assessment   Upper Extremity Assessment Upper Extremity Assessment: Defer to OT evaluation    Lower Extremity Assessment Lower Extremity Assessment: RLE deficits/detail;LLE deficits/detail RLE Deficits / Details: Residual weakness from prior strokes LLE Deficits /  Details: Decreased strength and AROM consistent with above mentioned procedure.     Cervical / Trunk Assessment Cervical / Trunk Assessment: Normal  Communication   Communication: No difficulties  Cognition Arousal/Alertness: Awake/alert Behavior During  Therapy: WFL for tasks assessed/performed Overall Cognitive Status: Within Functional Limits for tasks assessed                                        General Comments      Exercises Total Joint Exercises Ankle Circles/Pumps: 20 reps Quad Sets: 15 reps Heel Slides: 5 reps(to stretch) Goniometric ROM: 7-97 AAROM in sitting L knee   Assessment/Plan    PT Assessment Patient needs continued PT services  PT Problem List Decreased strength;Decreased range of motion;Decreased activity tolerance;Decreased balance;Decreased mobility;Decreased knowledge of use of DME;Decreased safety awareness;Decreased knowledge of precautions;Pain       PT Treatment Interventions DME instruction;Gait training;Stair training;Functional mobility training;Therapeutic activities;Therapeutic exercise;Neuromuscular re-education;Patient/family education    PT Goals (Current goals can be found in the Care Plan section)  Acute Rehab PT Goals Patient Stated Goal: Return home at d/c PT Goal Formulation: With patient Time For Goal Achievement: 05/23/17 Potential to Achieve Goals: Good    Frequency 7X/week   Barriers to discharge        Co-evaluation               AM-PAC PT "6 Clicks" Daily Activity  Outcome Measure Difficulty turning over in bed (including adjusting bedclothes, sheets and blankets)?: None Difficulty moving from lying on back to sitting on the side of the bed? : None Difficulty sitting down on and standing up from a chair with arms (e.g., wheelchair, bedside commode, etc,.)?: None Help needed moving to and from a bed to chair (including a wheelchair)?: A Little Help needed walking in hospital room?: A Little Help needed climbing 3-5 steps with a railing? : A Little 6 Click Score: 21    End of Session Equipment Utilized During Treatment: Gait belt Activity Tolerance: Patient tolerated treatment well Patient left: in chair;with call bell/phone within reach;with  chair alarm set Nurse Communication: Mobility status PT Visit Diagnosis: Unsteadiness on feet (R26.81);Pain;Difficulty in walking, not elsewhere classified (R26.2) Pain - Right/Left: Left Pain - part of body: Knee    Time: 6063-0160 PT Time Calculation (min) (ACUTE ONLY): 45 min   Charges:   PT Evaluation $PT Eval Moderate Complexity: 1 Mod PT Treatments $Gait Training: 23-37 mins   PT G Codes:        Rolinda Roan, PT, DPT Acute Rehabilitation Services Pager: [phone]   Thelma Comp 05/16/2017, 11:57 AM

## 2017-05-16 NOTE — Anesthesia Procedure Notes (Addendum)
Anesthesia Regional Block: Adductor canal block   Pre-Anesthetic Checklist: ,, timeout performed, Correct Patient, Correct Site, Correct Laterality, Correct Procedure, Correct Position, site marked, Risks and benefits discussed,  Surgical consent,  Pre-op evaluation,  At surgeon's request and post-op pain management  Laterality: Left  Prep: Maximum Sterile Barrier Precautions used, chloraprep       Needles:  Injection technique: Single-shot  Needle Type: Stimulator Needle - 80          Additional Needles:   Procedures: Doppler guided,,,, ultrasound used (permanent image in chart),,,,  Narrative:  Start time: 05/16/2017 12:15 PM End time: 05/16/2017 12:30 PM Injection made incrementally with aspirations every 5 mL.  Performed by: Personally  Anesthesiologist: Belinda Block, MD

## 2017-05-16 NOTE — Progress Notes (Signed)
   Subjective:  Patient reports pain as mild.  No events.  Objective:   VITALS:   Vitals:   05/15/17 1700 05/15/17 1727 05/15/17 2100 05/16/17 0602  BP: (!) 142/73 (!) 148/79 (!) 143/82 125/68  Pulse: 74 84 87 (!) 59  Resp: 13 14 18 18   Temp:  97.9 F (36.6 C) 98.2 F (36.8 C) 97.6 F (36.4 C)  TempSrc:  Oral Oral Oral  SpO2: 96% 97% 98% 98%  Weight:      Height:        Neurologically intact Neurovascular intact Sensation intact distally Intact pulses distally Dorsiflexion/Plantar flexion intact Incision: dressing C/D/I and no drainage No cellulitis present Compartment soft   Lab Results  Component Value Date   WBC 7.9 05/16/2017   HGB 12.4 (L) 05/16/2017   HCT 37.6 (L) 05/16/2017   MCV 94.0 05/16/2017   PLT 182 05/16/2017     Assessment/Plan:  1 Day Post-Op   - Expected postop acute blood loss anemia - will monitor for symptoms - Jehovah's witness - Up with PT/OT - DVT ppx - SCDs, ambulation, aspirin - WBAT operative extremity - Pain control - Discharge planning - likely home with HHPT  Eduard Roux 05/16/2017, 7:58 AM [phone]

## 2017-05-17 LAB — GLUCOSE, CAPILLARY
Glucose-Capillary: 182 mg/dL — ABNORMAL HIGH (ref 65–99)
Glucose-Capillary: 177 mg/dL — ABNORMAL HIGH (ref 65–99)

## 2017-05-17 NOTE — Progress Notes (Signed)
Doing well today.  Pain controlled.  Plan to work with PT for 1 more session today and then dc home.  Dressing c/d/i.

## 2017-05-17 NOTE — Anesthesia Postprocedure Evaluation (Signed)
Anesthesia Post Note  Patient: Andrew [name]  Procedure(s) Performed: LEFT TOTAL KNEE ARTHROPLASTY (Left Knee)     Patient location during evaluation: PACU Anesthesia Type: General Level of consciousness: awake Pain management: pain level controlled Vital Signs Assessment: post-procedure vital signs reviewed and stable Respiratory status: spontaneous breathing Cardiovascular status: stable Anesthetic complications: no    Last Vitals:  Vitals:   05/17/17 0923 05/17/17 1500  BP: 133/77 125/65  Pulse:  (!) 109  Resp:  18  Temp:  36.8 C  SpO2:  94%    Last Pain:  Vitals:   05/17/17 1500  TempSrc: Oral  PainSc:                  Fidel Caggiano

## 2017-05-17 NOTE — Plan of Care (Signed)
  Pain Managment: General experience of comfort will improve 05/17/2017 0121 - Progressing by Janee Morn, RN

## 2017-05-17 NOTE — Discharge Summary (Signed)
Physician Discharge Summary      Patient ID: Andrew [name] MRN: [id number] DOB/AGE: Jun 16, 1956 61 y.o.  Admit date: 05/15/2017 Discharge date: 05/17/2017  Admission Diagnoses:  <principal problem not specified>  Discharge Diagnoses:  Active Problems:   Total knee replacement status   Past Medical History:  Diagnosis Date  . Arthritis   . CRD (chronic renal disease), stage II    Baseline Cr 1.2  . CVA (cerebral vascular accident) ([place])    x4 last one in 2007, R sided residual weakness  . HAMMER TOE 07/25/2010  . HLD (hyperlipidemia)   . HTN (hypertension)   . Hypothyroidism   . Leg swelling   . Lipoma of back 03/18/2011  . Personal history of colonic adenomas 04/01/2013  . Prostatitis    hx of x2  . PSEUDOFOLLICULITIS BARBAE 03/13/6386   Qualifier: Diagnosis of  By: Andrew Mina  MD, Andrew [name]    . Sarcoidosis   . T2DM (type 2 diabetes mellitus) ([place])     Surgeries: Procedure(s): LEFT TOTAL KNEE ARTHROPLASTY on 05/15/2017   Consultants (if any):   Discharged Condition: Improved  Hospital Course: Andrew [name] is an 61 y.o. male who was admitted 05/15/2017 with a diagnosis of <principal problem not specified> and went to the operating room on 05/15/2017 and underwent the above named procedures.    He was given perioperative antibiotics:  Anti-infectives (From admission, onward)   Start     Dose/Rate Route Frequency Ordered Stop   05/15/17 2000  ceFAZolin (ANCEF) IVPB 2g/100 mL premix     2 g 200 mL/hr over 30 Minutes Intravenous Every 6 hours 05/15/17 1723 05/16/17 1255   05/15/17 1800  ceFAZolin (ANCEF) IVPB 2g/100 mL premix  Status:  Discontinued     2 g 200 mL/hr over 30 Minutes Intravenous Every 6 hours 05/15/17 1723 05/15/17 1733   05/15/17 0929  clindamycin (CLEOCIN) IVPB 900 mg     900 mg 100 mL/hr over 30 Minutes Intravenous On call to O.R. 05/15/17 5643 05/15/17 1310    .  He was given sequential compression devices, early ambulation, and aspirin for  DVT prophylaxis.  He benefited maximally from the hospital stay and there were no complications.    Recent vital signs:  Vitals:   05/16/17 2033 05/17/17 0521  BP: 137/66 (!) 154/69  Pulse: 67 (!) 59  Resp: 18 18  Temp: 98.6 F (37 C) 98.2 F (36.8 C)  SpO2: 98% 96%    Recent laboratory studies:  Lab Results  Component Value Date   HGB 12.4 (L) 05/16/2017   HGB 13.9 05/15/2017   HGB 13.8 05/07/2017   Lab Results  Component Value Date   WBC 7.9 05/16/2017   PLT 182 05/16/2017   Lab Results  Component Value Date   INR 0.98 05/15/2017   Lab Results  Component Value Date   NA 132 (L) 05/16/2017   K 4.4 05/16/2017   CL 103 05/16/2017   CO2 25 05/16/2017   BUN 13 05/16/2017   CREATININE 1.39 (H) 05/16/2017   GLUCOSE 217 (H) 05/16/2017    Discharge Medications:   Allergies as of 05/17/2017      Reactions   Metoprolol Succinate    REACTION: bradycardia (HR to 42)   Penicillins Itching   Has patient had a PCN reaction causing immediate rash, facial/tongue/throat swelling, SOB or lightheadedness with hypotension: No Has patient had a PCN reaction causing severe rash involving mucus membranes or skin necrosis: No Has patient had a PCN reaction  that required hospitalization No Has patient had a PCN reaction occurring within the last 10 years: No If all of the above answers are "NO", then may proceed with Cephalosporin use.      Medication List    STOP taking these medications   aspirin 81 MG chewable tablet Replaced by:  aspirin EC 325 MG tablet     TAKE these medications   acetaminophen 650 MG CR tablet Commonly known as:  TYLENOL Take 650 mg by mouth daily as needed for pain.   allopurinol 100 MG tablet Commonly known as:  ZYLOPRIM Take 2 tablets (200 mg total) by mouth daily.   amLODipine 10 MG tablet Commonly known as:  NORVASC Take 1 tablet (10 mg total) by mouth daily.   aspirin EC 325 MG tablet Take 1 tablet (325 mg total) 2 (two) times daily by  mouth. Replaces:  aspirin 81 MG chewable tablet   blood glucose meter kit and supplies Use to test fasting glucose daily.  Diagnosis Type II diabetes 250.0   cyclobenzaprine 10 MG tablet Commonly known as:  FLEXERIL Take 1 tablet (10 mg total) by mouth 3 (three) times daily as needed for muscle spasms.   docusate sodium 100 MG capsule Commonly known as:  COLACE Take 100 mg by mouth daily.   enalapril 20 MG tablet Commonly known as:  VASOTEC Take 2 tablets (40 mg total) by mouth daily.   glucose blood test strip Commonly known as:  TRUE METRIX BLOOD GLUCOSE TEST USE TO TEST BLOOD SUGAR TWICE DAILY FOR TYPE II  DIABETES   levothyroxine 75 MCG tablet Commonly known as:  SYNTHROID, LEVOTHROID Take 1 tablet (75 mcg total) by mouth daily.   lovastatin 40 MG tablet Commonly known as:  MEVACOR Take 1 tablet (40 mg total) by mouth at bedtime.   mupirocin ointment 2 % Commonly known as:  BACTROBAN 1 application to the right great toe twice daily   ondansetron 4 MG tablet Commonly known as:  ZOFRAN Take 1-2 tablets (4-8 mg total) every 8 (eight) hours as needed by mouth for nausea or vomiting.   oxyCODONE 10 mg 12 hr tablet Commonly known as:  OXYCONTIN Take 1 tablet (10 mg total) every 12 (twelve) hours by mouth.   oxyCODONE 5 MG immediate release tablet Commonly known as:  Oxy IR/ROXICODONE Take 1-3 tablets (5-15 mg total) every 4 (four) hours as needed by mouth.   pioglitazone 30 MG tablet Commonly known as:  ACTOS Take 1 tablet (30 mg total) by mouth daily.   PRODIGY LANCETS 26G Misc Use to check fasting blood glucose each morning.  Diagnosis: Diabetes Mellitus Type II 250.00   promethazine 25 MG tablet Commonly known as:  PHENERGAN Take 1 tablet (25 mg total) every 6 (six) hours as needed by mouth for nausea.   senna-docusate 8.6-50 MG tablet Commonly known as:  SENOKOT S Take 1 tablet at bedtime as needed by mouth.   tiZANidine 4 MG tablet Commonly known as:   ZANAFLEX Take 1 tablet (4 mg total) every 6 (six) hours as needed by mouth for muscle spasms.   traMADol 50 MG tablet Commonly known as:  ULTRAM Take 1 tablet (50 mg total) by mouth every 8 (eight) hours as needed. What changed:    when to take this  reasons to take this   traZODone 100 MG tablet Commonly known as:  DESYREL Take 1 tablet (100 mg total) by mouth at bedtime.   traZODone 100 MG tablet Commonly known as:  DESYREL Take 1 tablet (100 mg total) by mouth at bedtime.   triamcinolone cream 0.1 % Commonly known as:  KENALOG Apply 1 application topically 2 (two) times daily.            Durable Medical Equipment  (From admission, onward)        Start     Ordered   05/15/17 1723  DME Walker rolling  Once    Question:  Patient needs a walker to treat with the following condition  Answer:  Total knee replacement status   05/15/17 1723   05/15/17 1723  DME 3 n 1  Once     05/15/17 1723   05/15/17 1723  DME Bedside commode  Once    Question:  Patient needs a bedside commode to treat with the following condition  Answer:  Total knee replacement status   05/15/17 1723   05/15/17 1723  DME Walker rolling  Once    Question:  Patient needs a walker to treat with the following condition  Answer:  Total knee replacement status   05/15/17 1723   05/15/17 1723  DME 3 n 1  Once     05/15/17 1723   05/15/17 1723  DME Bedside commode  Once    Question:  Patient needs a bedside commode to treat with the following condition  Answer:  Total knee replacement status   05/15/17 1723      Diagnostic Studies: Dg Knee Left Port  Result Date: 05/15/2017 CLINICAL DATA:  Total knee replacement EXAM: PORTABLE LEFT KNEE - 1-2 VIEW COMPARISON:  01/24/2017 FINDINGS: Total knee arthroplasty. No complicating features. Postsurgical gas in the joint. IMPRESSION: Total knee arthroplasty.  No complication. Electronically Signed   By: Suzy Bouchard M.D.   On: 05/15/2017 16:14     Disposition: 01-Home or Self Care  Discharge Instructions    Call MD / Call 911   Complete by:  As directed    If you experience chest pain or shortness of breath, CALL 911 and be transported to the hospital emergency room.  If you develope a fever above 101.5 F, pus (white drainage) or increased drainage or redness at the wound, or calf pain, call your surgeon's office.   Constipation Prevention   Complete by:  As directed    Drink plenty of fluids.  Prune juice may be helpful.  You may use a stool softener, such as Colace (over the counter) 100 mg twice a day.  Use MiraLax (over the counter) for constipation as needed.   Driving restrictions   Complete by:  As directed    No driving while taking narcotic pain meds.   Increase activity slowly as tolerated   Complete by:  As directed       Follow-up Information    Leandrew Koyanagi, MD In 2 weeks.   Specialty:  Orthopedic Surgery Why:  For suture removal, For wound re-check Contact information: [address] [place] [address] [phone]            Signed: Eduard Roux 05/17/2017, 7:26 AM

## 2017-05-17 NOTE — Progress Notes (Signed)
Physical Therapy Treatment Patient Details Name: Andrew [name] MRN: [id number] DOB: 03-09-1956 Today's Date: 05/17/2017    History of Present Illness Pt is a 61 y/o male who presents s/p L TKA on 05/15/17. 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     PT Comments    Patient is making good progress with PT.  From a mobility standpoint anticipate patient will be ready for DC home when medically ready.    Follow Up Recommendations  Home health PT;Supervision for mobility/OOB     Equipment Recommendations  Rolling walker with 5" wheels;3in1 (PT)    Recommendations for Other Services       Precautions / Restrictions Precautions Precautions: Fall Precaution Comments: positioning/precuations reveiwed with pt  Restrictions Weight Bearing Restrictions: Yes LLE Weight Bearing: Weight bearing as tolerated    Mobility  Bed Mobility               General bed mobility comments: pt OOB in chair upon arrival  Transfers Overall transfer level: Needs assistance Equipment used: Rolling walker (2 wheeled) Transfers: Sit to/from Stand Sit to Stand: Supervision         General transfer comment: supervision for safety; demonstrated safe hand placement   Ambulation/Gait Ambulation/Gait assistance: Supervision Ambulation Distance (Feet): 300 Feet Assistive device: Rolling walker (2 wheeled) Gait Pattern/deviations: Step-to pattern;Step-through pattern;Decreased stride length;Decreased dorsiflexion - left;Decreased stance time - left;Decreased step length - right Gait velocity: Decreased   General Gait Details: cues for sequencing, step length symmetry, and cadence; pt with good L knee flexion during swing phase and good heel strike/toe off   Stairs     Stair Management: Step to pattern;Forwards;One rail Left;Sideways Number of Stairs: (2 steps X2) General stair comments: cues for sequencing and technique  Wheelchair Mobility    Modified Rankin (Stroke  Patients Only)       Balance Overall balance assessment: Needs assistance Sitting-balance support: Feet supported Sitting balance-Leahy Scale: Good     Standing balance support: Bilateral upper extremity supported;During functional activity Standing balance-Leahy Scale: Poor                              Cognition Arousal/Alertness: Awake/alert Behavior During Therapy: WFL for tasks assessed/performed Overall Cognitive Status: Within Functional Limits for tasks assessed                                        Exercises Total Joint Exercises Long Arc Quad: AROM;Left;Seated;15 reps    General Comments General comments (skin integrity, edema, etc.): reviewed HEP frequency and use of ice      Pertinent Vitals/Pain Pain Assessment: Faces Faces Pain Scale: Hurts little more Pain Location: L knee Pain Descriptors / Indicators: Aching;Guarding Pain Intervention(s): Monitored during session;Premedicated before session;Repositioned    Home Living Family/patient expects to be discharged to:: Private residence Living Arrangements: Children             Additional Comments: Pt. is having 3-1 issued from hospital. Pt. was educated that it may not work in tub. Pt. was also educated on use of tub seat vs bench.    Prior Function            PT Goals (current goals can now be found in the care plan section) Acute Rehab PT Goals Patient Stated Goal: go home PT Goal Formulation: With patient Time  For Goal Achievement: 05/23/17 Potential to Achieve Goals: Good Progress towards PT goals: Progressing toward goals    Frequency    7X/week      PT Plan Current plan remains appropriate    Co-evaluation              AM-PAC PT "6 Clicks" Daily Activity  Outcome Measure  Difficulty turning over in bed (including adjusting bedclothes, sheets and blankets)?: None Difficulty moving from lying on back to sitting on the side of the bed? :  None Difficulty sitting down on and standing up from a chair with arms (e.g., wheelchair, bedside commode, etc,.)?: None Help needed moving to and from a bed to chair (including a wheelchair)?: None Help needed walking in hospital room?: A Little Help needed climbing 3-5 steps with a railing? : A Little 6 Click Score: 22    End of Session Equipment Utilized During Treatment: Gait belt Activity Tolerance: Patient tolerated treatment well Patient left: in chair;with call bell/phone within reach Nurse Communication: Mobility status PT Visit Diagnosis: Unsteadiness on feet (R26.81);Pain;Difficulty in walking, not elsewhere classified (R26.2) Pain - Right/Left: Left Pain - part of body: Knee     Time: 8270-7867 PT Time Calculation (min) (ACUTE ONLY): 29 min  Charges:  $Gait Training: 23-37 mins                    G Codes:       Earney Navy, PTA Pager: [phone]     Darliss Cheney 05/17/2017, 10:40 AM

## 2017-05-17 NOTE — Care Management Note (Signed)
Case Management Note  Patient Details  Name: Andrew [name] MRN: [id number] Date of Birth: 04/08/1956  Subjective/Objective:                 Patient will follow up for [facility] with [facility], and DME RW 3/1 will be delivered to room by [facility] prior to DC. All referrals placed and accepted. No other CM needs identified. CM signing off.   Action/Plan:   Expected Discharge Date:  05/17/17               Expected Discharge Plan:  [place]  In-House Referral:     Discharge planning Services  CM Consult  Post Acute Care Choice:  Durable Medical Equipment, Home Health Choice offered to:  Patient  DME Arranged:  3-N-1, Walker rolling DME Agency:  [place]:  PT [place]:  [facility] (now [facility])  Status of Service:  Completed, signed off  If discussed at [employer] of Stay Meetings, dates discussed:    Additional Comments:  Carles Collet, RN 05/17/2017, 10:29 AM

## 2017-05-17 NOTE — Progress Notes (Signed)
Occupational Therapy Treatment Patient Details Name: Andrew [name] MRN: [id number] DOB: 1955-08-08 Today's Date: 05/17/2017    History of present illness Pt is a 61 y/o male who presents s/p L TKA on 05/15/17. 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    OT comments  Pt. Was seen and educated on use of AE for LE ADLs. Pt. Was educated on safe transfers to 3-1 commode. Pt. Was educated on safe transfers using tub seat vs bench.   Follow Up Recommendations       Equipment Recommendations       Recommendations for Other Services      Precautions / Restrictions Precautions Precautions: Fall Restrictions Weight Bearing Restrictions: Yes LLE Weight Bearing: Weight bearing as tolerated       Mobility Bed Mobility                  Transfers       Sit to Stand: Supervision              Balance                                           ADL either performed or assessed with clinical judgement   ADL                       Lower Body Dressing: Minimal assistance;With adaptive equipment   Toilet Transfer: Supervision/safety;Ambulation;BSC   Toileting- Water quality scientist and Hygiene: Supervision/safety;Sit to/from stand         General ADL Comments: Pt. was educated that 3-1 commode might not fit in tub/shower combo. pt. was educated that he may need tub seat vs bench and was educated on both.     Vision       Perception     Praxis      Cognition Arousal/Alertness: Awake/alert Behavior During Therapy: WFL for tasks assessed/performed Overall Cognitive Status: Within Functional Limits for tasks assessed                                          Exercises     Shoulder Instructions       General Comments      Pertinent Vitals/ Pain          Home Living Family/patient expects to be discharged to:: Private residence Living Arrangements: Children                 Bathroom  Shower/Tub: Tub/shower unit             Additional Comments: Pt. is having 3-1 issued from hospital. Pt. was educated that it may not work in tub. Pt. was also educated on use of tub seat vs bench.      Prior Functioning/Environment              Frequency           Progress Toward Goals  OT Goals(current goals can now be found in the care plan section)  Progress towards OT goals: Progressing toward goals  Acute Rehab OT Goals Patient Stated Goal: go home  Plan      Co-evaluation                 AM-PAC PT "6 Clicks" Daily  Activity     Outcome Measure   Help from another person eating meals?: None Help from another person taking care of personal grooming?: None Help from another person toileting, which includes using toliet, bedpan, or urinal?: A Little Help from another person bathing (including washing, rinsing, drying)?: A Little Help from another person to put on and taking off regular upper body clothing?: None Help from another person to put on and taking off regular lower body clothing?: A Little 6 Click Score: 21    End of Session Equipment Utilized During Treatment: Gait belt      Activity Tolerance Patient tolerated treatment well   Patient Left in chair;with call bell/phone within reach   Nurse Communication (ok therapy)        Time: 7408-1448 OT Time Calculation (min): 24 min  Charges:    6 clicks   Andrew [name] 05/17/2017, 10:26 AM

## 2017-05-17 NOTE — Progress Notes (Signed)
Discharge instructions and medications reviewed with patient. Verbalized understanding. Iv removed, catheter tip intact. All post op instructions reviewed and verbalized understanding. No questions/concerns at this time. Pt transferred via wheelchair to lobby for discharge.

## 2017-05-18 DIAGNOSIS — M1A09X0 Idiopathic chronic gout, multiple sites, without tophus (tophi): Secondary | ICD-10-CM | POA: Diagnosis not present

## 2017-05-18 DIAGNOSIS — M1711 Unilateral primary osteoarthritis, right knee: Secondary | ICD-10-CM | POA: Diagnosis not present

## 2017-05-18 DIAGNOSIS — I129 Hypertensive chronic kidney disease with stage 1 through stage 4 chronic kidney disease, or unspecified chronic kidney disease: Secondary | ICD-10-CM | POA: Diagnosis not present

## 2017-05-18 DIAGNOSIS — E1122 Type 2 diabetes mellitus with diabetic chronic kidney disease: Secondary | ICD-10-CM | POA: Diagnosis not present

## 2017-05-18 DIAGNOSIS — Z471 Aftercare following joint replacement surgery: Secondary | ICD-10-CM | POA: Diagnosis not present

## 2017-05-18 DIAGNOSIS — N183 Chronic kidney disease, stage 3 (moderate): Secondary | ICD-10-CM | POA: Diagnosis not present

## 2017-05-20 ENCOUNTER — Telehealth (INDEPENDENT_AMBULATORY_CARE_PROVIDER_SITE_OTHER): Payer: Self-pay | Admitting: Orthopaedic Surgery

## 2017-05-20 NOTE — Telephone Encounter (Signed)
Is this okay?

## 2017-05-20 NOTE — Telephone Encounter (Signed)
yes

## 2017-05-20 NOTE — Telephone Encounter (Signed)
Physical Therapy verbal orders 1 time a week for 1 week 3 times a week for 2 weeks  Laurey Arrow PT [phone]

## 2017-05-20 NOTE — Telephone Encounter (Signed)
  Called Andrew [name] to advise ok per Dr Erlinda Hong

## 2017-05-21 ENCOUNTER — Other Ambulatory Visit: Payer: Self-pay | Admitting: *Deleted

## 2017-05-21 ENCOUNTER — Encounter: Payer: Self-pay | Admitting: *Deleted

## 2017-05-21 DIAGNOSIS — E1122 Type 2 diabetes mellitus with diabetic chronic kidney disease: Secondary | ICD-10-CM | POA: Diagnosis not present

## 2017-05-21 DIAGNOSIS — N183 Chronic kidney disease, stage 3 (moderate): Secondary | ICD-10-CM | POA: Diagnosis not present

## 2017-05-21 DIAGNOSIS — Z471 Aftercare following joint replacement surgery: Secondary | ICD-10-CM | POA: Diagnosis not present

## 2017-05-21 DIAGNOSIS — I129 Hypertensive chronic kidney disease with stage 1 through stage 4 chronic kidney disease, or unspecified chronic kidney disease: Secondary | ICD-10-CM | POA: Diagnosis not present

## 2017-05-21 DIAGNOSIS — M1A09X0 Idiopathic chronic gout, multiple sites, without tophus (tophi): Secondary | ICD-10-CM | POA: Diagnosis not present

## 2017-05-21 DIAGNOSIS — M1711 Unilateral primary osteoarthritis, right knee: Secondary | ICD-10-CM | POA: Diagnosis not present

## 2017-05-21 NOTE — Patient Outreach (Signed)
[facility]) Care Management  05/21/2017  Andrew [name] 08-Aug-1955 [id number]  Received return call from patient. HIPPA verification received. Patient consents to [facility] telephonic services. Voices recently discharged from hospital 11/9 following left total knee replacement.   Transition of care call#1   Subjective:  Patient voices he is currently at home with assistance from his son.  States [facility] provides home physical therapy services 3 times weekly. States he is managing his medications and is taking as prescribed by his MD. Voices he is using ice packs in addition to pain medications for pain which helps tremendously.  Using walker to get around. Exercises by walking around in home.  Patient voices that he wants to learn all that he needs to learn to have a successful recovery.   Objective:  Per hx other medical conditions -Chronic renal disease stage 2, CVA x 4, Hyperlipedemia, HTN, Hypothyroidism, Sarcoidosis, DM 2  Current Medications: Current Outpatient Medications  Medication Sig Dispense Refill  . acetaminophen (TYLENOL) 650 MG CR tablet Take 650 mg by mouth daily as needed for pain.    Marland Kitchen allopurinol (ZYLOPRIM) 100 MG tablet Take 2 tablets (200 mg total) by mouth daily. 180 tablet 3  . amLODipine (NORVASC) 10 MG tablet Take 1 tablet (10 mg total) by mouth daily. 90 tablet 3  . aspirin EC 325 MG tablet Take 1 tablet (325 mg total) 2 (two) times daily by mouth. 84 tablet 0  . Blood Glucose Monitoring Suppl (BLOOD GLUCOSE METER) kit Use to test fasting glucose daily.  Diagnosis Type II diabetes 250.0 1 each 0  . cyclobenzaprine (FLEXERIL) 10 MG tablet Take 1 tablet (10 mg total) by mouth 3 (three) times daily as needed for muscle spasms. (Patient not taking: Reported on 05/06/2017) 20 tablet 2  . docusate sodium (COLACE) 100 MG capsule Take 100 mg by mouth daily.    . enalapril (VASOTEC) 20 MG tablet Take 2 tablets (40 mg total) by mouth daily. 180  tablet 3  . glucose blood (TRUE METRIX BLOOD GLUCOSE TEST) test strip USE TO TEST BLOOD SUGAR TWICE DAILY FOR TYPE II  DIABETES 200 each 5  . levothyroxine (SYNTHROID, LEVOTHROID) 75 MCG tablet Take 1 tablet (75 mcg total) by mouth daily. 90 tablet 3  . lovastatin (MEVACOR) 40 MG tablet Take 1 tablet (40 mg total) by mouth at bedtime. 90 tablet 3  . mupirocin ointment (BACTROBAN) 2 % 1 application to the right great toe twice daily (Patient not taking: Reported on 05/06/2017) 22 g 0  . ondansetron (ZOFRAN) 4 MG tablet Take 1-2 tablets (4-8 mg total) every 8 (eight) hours as needed by mouth for nausea or vomiting. 40 tablet 0  . oxyCODONE (OXY IR/ROXICODONE) 5 MG immediate release tablet Take 1-3 tablets (5-15 mg total) every 4 (four) hours as needed by mouth. 30 tablet 0  . oxyCODONE (OXYCONTIN) 10 mg 12 hr tablet Take 1 tablet (10 mg total) every 12 (twelve) hours by mouth. 10 tablet 0  . oxyCODONE-acetaminophen (PERCOCET/ROXICET) 5-325 MG tablet Take 1 tablet every 4 (four) hours as needed by mouth for severe pain. 30 tablet 0  . pioglitazone (ACTOS) 30 MG tablet Take 1 tablet (30 mg total) by mouth daily. 90 tablet 3  . PRODIGY LANCETS 26G MISC Use to check fasting blood glucose each morning.  Diagnosis: Diabetes Mellitus Type II 250.00 100 each 12  . promethazine (PHENERGAN) 25 MG tablet Take 1 tablet (25 mg total) every 6 (six) hours as needed  by mouth for nausea. 30 tablet 1  . senna-docusate (SENOKOT S) 8.6-50 MG tablet Take 1 tablet at bedtime as needed by mouth. 30 tablet 1  . tiZANidine (ZANAFLEX) 4 MG tablet Take 1 tablet (4 mg total) every 6 (six) hours as needed by mouth for muscle spasms. 30 tablet 2  . traMADol (ULTRAM) 50 MG tablet Take 1 tablet (50 mg total) by mouth every 8 (eight) hours as needed. (Patient not taking: Reported on 05/21/2017) 30 tablet 0  . traZODone (DESYREL) 100 MG tablet Take 1 tablet (100 mg total) by mouth at bedtime. (Patient not taking: Reported on 05/06/2017)  90 tablet 3  . traZODone (DESYREL) 100 MG tablet Take 1 tablet (100 mg total) by mouth at bedtime. (Patient not taking: Reported on 05/06/2017) 90 tablet 3  . triamcinolone cream (KENALOG) 0.1 % Apply 1 application topically 2 (two) times daily. 30 g 1   Current Facility-Administered Medications  Medication Dose Route Frequency Provider Last Rate Last Dose  . betamethasone acetate-betamethasone sodium phosphate (CELESTONE) injection 3 mg  3 mg Intramuscular Once Edrick Kins, DPM        Functional Status: In your present state of health, do you have any difficulty performing the following activities: 05/21/2017 05/07/2017  Hearing? N N  Vision? N N  Difficulty concentrating or making decisions? N N  Walking or climbing stairs? Y Y  Comment - painful and has to hold on for stability  Dressing or bathing? N N  Doing errands, shopping? Y N  Preparing Food and eating ? - -  Using the Toilet? - -  In the past six months, have you accidently leaked urine? - -  Do you have problems with loss of bowel control? - -  Managing your Medications? - -  Managing your Finances? - -  Housekeeping or managing your Housekeeping? - -  Some recent data might be hidden    Fall/Depression Screening: Fall Risk  05/21/2017 01/24/2017 12/19/2016  Falls in the past year? No No No  Number falls in past yr: - - -  Injury with Fall? - - -  Risk for fall due to : Impaired mobility;Medication side effect - -   PHQ 2/9 Scores 05/21/2017 04/29/2017 01/24/2017 12/19/2016 07/31/2016 05/15/2016 12/26/2015  PHQ - 2 Score 0 0 0 0 0 0 0  PHQ- 9 Score - - - - - 0 -  Exception Documentation - - - - - - -    Assessment: Patient appropriate for [facility] services of Transition of Care following hospital stay of knee replacement -left .  Patient consents to [facility] services.   THN CM Care Plan Problem One     Most Recent Value  Care Plan Problem One  At risk for readmission folllowing recent admission for knee replacement  Role  Documenting the Problem One  Care Management Telephonic Coordinator  Care Plan for Problem One  Active  THN Long Term Goal   No readmission within 30 days of previous admission   THN Long Term Goal Start Date  05/21/17  Interventions for Problem One Long Term Goal  Case manager will teach patient importance of MD follow up, early reporting of sxs that require MD notification, signs of complications  THN CM Short Term Goal #1   Pt will state 3 abnormal post op sxs that require MD notification within 2 weeks  THN CM Short Term Goal #1 Start Date  05/21/17  Interventions for Short Term Goal #1  Provide verbal &  educational literature on post op complications- infection, bleeding, blood clot, pneumonia  THN CM Short Term Goal #2   Patient will report appointment dates for MD hospital follow up within 2 weeks   THN CM Short Term Goal #2 Start Date  05/21/17  Interventions for Short Term Goal #2  Advise patient of importance of  making appointment & attending hospital follow up with MD      Plan: Follow care plan as noted.  Follow up with patient weekly. Send welcome packet to patient along with educational literaure. Send involvement letter to MD. Review & re-enforce discharge instructions with patient. Appointment set with patient agreement.   Sherrin Daisy, RN BSN [place] Management Coordinator [facility] Care Management  [phone]

## 2017-05-21 NOTE — Patient Outreach (Addendum)
[place] [facility]) Care Management  05/21/2017  Andrew [name] 20-Sep-1955 [id number]  Referral from [place]; patient discharged from inpatient admission from [facility] 05/17/2017.   Per chart review: Admission 11/7-11/03/2017 Surgery -Total knee replacement -left  Telephone call to patient; left HIPPA compliant voices mail requesting call back.  Plan: Will follow up.  05/21/2017 3:31 pm/Return call from patient . HIPPA verification received. Patient consents to [facility] telephonic services. Voices recently discharged from hospital 11/9 following left total knee replacement.     Sherrin Daisy, RN BSN [place] Management Coordinator [facility] Care Management  [phone]

## 2017-05-22 ENCOUNTER — Telehealth (INDEPENDENT_AMBULATORY_CARE_PROVIDER_SITE_OTHER): Payer: Self-pay | Admitting: Orthopaedic Surgery

## 2017-05-22 MED ORDER — OXYCODONE-ACETAMINOPHEN 5-325 MG PO TABS: 1.0000 | ORAL_TABLET | ORAL | 0 refills | Status: DC | PRN

## 2017-05-22 NOTE — Telephone Encounter (Signed)
Pending signature. Dr Erlinda Hong is in Surgery all day today and Rx will be ready for pick up tomorrow AM

## 2017-05-22 NOTE — Telephone Encounter (Signed)
30

## 2017-05-22 NOTE — Telephone Encounter (Signed)
Rx refill Oxycodone °

## 2017-05-22 NOTE — Telephone Encounter (Signed)
Please advise 

## 2017-05-23 ENCOUNTER — Encounter: Payer: Self-pay | Admitting: *Deleted

## 2017-05-23 DIAGNOSIS — M1A09X0 Idiopathic chronic gout, multiple sites, without tophus (tophi): Secondary | ICD-10-CM | POA: Diagnosis not present

## 2017-05-23 DIAGNOSIS — Z471 Aftercare following joint replacement surgery: Secondary | ICD-10-CM | POA: Diagnosis not present

## 2017-05-23 DIAGNOSIS — E1122 Type 2 diabetes mellitus with diabetic chronic kidney disease: Secondary | ICD-10-CM | POA: Diagnosis not present

## 2017-05-23 DIAGNOSIS — M1711 Unilateral primary osteoarthritis, right knee: Secondary | ICD-10-CM | POA: Diagnosis not present

## 2017-05-23 DIAGNOSIS — N183 Chronic kidney disease, stage 3 (moderate): Secondary | ICD-10-CM | POA: Diagnosis not present

## 2017-05-23 DIAGNOSIS — I129 Hypertensive chronic kidney disease with stage 1 through stage 4 chronic kidney disease, or unspecified chronic kidney disease: Secondary | ICD-10-CM | POA: Diagnosis not present

## 2017-05-23 NOTE — Telephone Encounter (Signed)
Rx ready for pick up. He states he will send someone else for Rx I advised make sure that person was on his HIPPA. He states he never filed out [place] did not see one myself nor Christina. Advised patient next time he comes to fill one out so there wont be any issues.

## 2017-05-28 ENCOUNTER — Ambulatory Visit: Payer: Self-pay | Admitting: *Deleted

## 2017-05-29 ENCOUNTER — Other Ambulatory Visit: Payer: Self-pay | Admitting: *Deleted

## 2017-05-29 DIAGNOSIS — E1122 Type 2 diabetes mellitus with diabetic chronic kidney disease: Secondary | ICD-10-CM | POA: Diagnosis not present

## 2017-05-29 DIAGNOSIS — N183 Chronic kidney disease, stage 3 (moderate): Secondary | ICD-10-CM | POA: Diagnosis not present

## 2017-05-29 DIAGNOSIS — M1A09X0 Idiopathic chronic gout, multiple sites, without tophus (tophi): Secondary | ICD-10-CM | POA: Diagnosis not present

## 2017-05-29 DIAGNOSIS — M1711 Unilateral primary osteoarthritis, right knee: Secondary | ICD-10-CM | POA: Diagnosis not present

## 2017-05-29 DIAGNOSIS — Z471 Aftercare following joint replacement surgery: Secondary | ICD-10-CM | POA: Diagnosis not present

## 2017-05-29 DIAGNOSIS — I129 Hypertensive chronic kidney disease with stage 1 through stage 4 chronic kidney disease, or unspecified chronic kidney disease: Secondary | ICD-10-CM | POA: Diagnosis not present

## 2017-05-29 NOTE — Patient Outreach (Signed)
[place] [facility]) Care Management  05/29/2017  Abem Shaddix 1955-10-30 [id number]  Transition of care call #2  Patient voices that he is doing well. Voices that he completes home therapy this Friday and will start outpatient therapy for step. States he does not take any narcotic pain medication now. States very consistent with exercises.   States he will have follow up with orthopedist 11/26. Voices that he has appointment with primary care in Jan.  States currently having no problems.  Plans: Update care plan. Follow up next week. Patient in agreement with set appointment.  Sherrin Daisy, RN BSN [place] Management Coordinator [facility] Care Management  [phone]

## 2017-05-31 DIAGNOSIS — M1A09X0 Idiopathic chronic gout, multiple sites, without tophus (tophi): Secondary | ICD-10-CM | POA: Diagnosis not present

## 2017-05-31 DIAGNOSIS — N183 Chronic kidney disease, stage 3 (moderate): Secondary | ICD-10-CM | POA: Diagnosis not present

## 2017-05-31 DIAGNOSIS — E1122 Type 2 diabetes mellitus with diabetic chronic kidney disease: Secondary | ICD-10-CM | POA: Diagnosis not present

## 2017-05-31 DIAGNOSIS — Z471 Aftercare following joint replacement surgery: Secondary | ICD-10-CM | POA: Diagnosis not present

## 2017-05-31 DIAGNOSIS — M1711 Unilateral primary osteoarthritis, right knee: Secondary | ICD-10-CM | POA: Diagnosis not present

## 2017-05-31 DIAGNOSIS — I129 Hypertensive chronic kidney disease with stage 1 through stage 4 chronic kidney disease, or unspecified chronic kidney disease: Secondary | ICD-10-CM | POA: Diagnosis not present

## 2017-06-01 ENCOUNTER — Encounter (HOSPITAL_COMMUNITY): Payer: Self-pay

## 2017-06-01 ENCOUNTER — Emergency Department (HOSPITAL_COMMUNITY)
Admission: EM | Admit: 2017-06-01 | Discharge: 2017-06-01 | Disposition: A | Discharge status: Home / Self Care | Payer: Medicare HMO | Attending: Emergency Medicine | Admitting: Emergency Medicine

## 2017-06-01 DIAGNOSIS — K5900 Constipation, unspecified: Secondary | ICD-10-CM | POA: Insufficient documentation

## 2017-06-01 DIAGNOSIS — E119 Type 2 diabetes mellitus without complications: Secondary | ICD-10-CM | POA: Diagnosis not present

## 2017-06-01 DIAGNOSIS — Z8673 Personal history of transient ischemic attack (TIA), and cerebral infarction without residual deficits: Secondary | ICD-10-CM | POA: Insufficient documentation

## 2017-06-01 DIAGNOSIS — Z7982 Long term (current) use of aspirin: Secondary | ICD-10-CM | POA: Diagnosis not present

## 2017-06-01 DIAGNOSIS — E039 Hypothyroidism, unspecified: Secondary | ICD-10-CM | POA: Insufficient documentation

## 2017-06-01 DIAGNOSIS — Z7984 Long term (current) use of oral hypoglycemic drugs: Secondary | ICD-10-CM | POA: Insufficient documentation

## 2017-06-01 DIAGNOSIS — N183 Chronic kidney disease, stage 3 (moderate): Secondary | ICD-10-CM | POA: Insufficient documentation

## 2017-06-01 DIAGNOSIS — I129 Hypertensive chronic kidney disease with stage 1 through stage 4 chronic kidney disease, or unspecified chronic kidney disease: Secondary | ICD-10-CM | POA: Insufficient documentation

## 2017-06-01 DIAGNOSIS — Z79899 Other long term (current) drug therapy: Secondary | ICD-10-CM | POA: Insufficient documentation

## 2017-06-01 DIAGNOSIS — Z96652 Presence of left artificial knee joint: Secondary | ICD-10-CM | POA: Insufficient documentation

## 2017-06-01 MED ORDER — MINERAL OIL RE ENEM: 1.0000 | ENEMA | Freq: Once | RECTAL | Status: DC

## 2017-06-01 MED ORDER — POLYETHYLENE GLYCOL 3350 17 G PO PACK: 17.0000 g | PACK | Freq: Every day | ORAL | 0 refills | Status: DC

## 2017-06-01 MED ORDER — FLEET ENEMA 7-19 GM/118ML RE ENEM
1.0000 | ENEMA | Freq: Once | RECTAL | Status: AC
  Administered 2017-06-01: 1 via RECTAL
  Filled 2017-06-01: qty 1

## 2017-06-01 MED ORDER — POLYETHYLENE GLYCOL 3350 17 G PO PACK: 17.0000 g | PACK | Freq: Every day | ORAL | Status: DC

## 2017-06-01 NOTE — ED Notes (Signed)
Pt had a bowel movement after 1st enema and states to this RN "I feel better and I don't think I need another enema"

## 2017-06-01 NOTE — Discharge Instructions (Signed)
Continue to eat high fiber diet with water and exercise as tolerated. Use MiraLAX daily until all your bowel movements are normal. See a clinician if you develop vomiting, fevers, persistent pain or other concerns.

## 2017-06-01 NOTE — ED Triage Notes (Signed)
Pt d/c from hospital 05-15-17 for Left TKR.  Last stool was 05-15-17.  Pt is having rectal pain.  Pt has been taking stool softeners.

## 2017-06-01 NOTE — ED Notes (Signed)
FLEET administered. Pt placed on bedside commode.

## 2017-06-01 NOTE — ED Provider Notes (Signed)
[facility] EMERGENCY DEPARTMENT Provider Note   CSN: [id number] Arrival date & time: 06/01/17  1545     History   Chief Complaint Chief Complaint  Patient presents with  . Constipation    HPI Andrew [name] is a 61 y.o. male.  Patient presents with constipation for the past 2 weeks since his left knee surgery which went well. Patient was on narcotics and is no longer taking. Patient has been trying to eat high-fiber diet and stopped taking his narcotics however unable to have a normal bowel movement. Patient had rectalpain with last attempt. No fevers chills or vomiting.no abdominal pain.patient has been taking Dulcolax.      Past Medical History:  Diagnosis Date  . Arthritis   . CRD (chronic renal disease), stage II    Baseline Cr 1.2  . CVA (cerebral vascular accident) ([place])    x4 last one in 2007, R sided residual weakness  . HAMMER TOE 07/25/2010  . HLD (hyperlipidemia)   . HTN (hypertension)   . Hypothyroidism   . Leg swelling   . Lipoma of back 03/18/2011  . Personal history of colonic adenomas 04/01/2013  . Prostatitis    hx of x2  . PSEUDOFOLLICULITIS BARBAE 4/0/9811   Qualifier: Diagnosis of  By: Danise Mina  MD, Garlon Hatchet    . Sarcoidosis   . T2DM (type 2 diabetes mellitus) [facility])     Patient Active Problem List   Diagnosis Date Noted  . Total knee replacement status 05/15/2017  . Chronic gout of multiple sites 05/02/2017  . Left foot pain 10/22/2016  . Contracture of toe, left 10/22/2016  . Blister 08/01/2016  . Myalgia 05/16/2016  . Healthcare maintenance 04/04/2016  . Osteoarthritis of both knees 12/26/2015  . Foot lesion 03/24/2015  . Bilateral foot pain 10/26/2014  . Impairment of balance 09/02/2014  . History of stroke 09/02/2014  . De Quervain's tenosynovitis, right 11/05/2013  . Pinguecula of both eyes 09/23/2013  . Chronic hyponatremia 09/23/2013  . Hypothyroidism 05/25/2013  . Memory loss 05/21/2013  . Personal  history of colonic adenomas 04/01/2013  . Joint pain 05/06/2011  . OBESITY, UNSPECIFIED 05/20/2009  . Knee pain, bilateral 04/28/2009  . LUMBAGO 03/25/2009  . PSEUDOFOLLICULITIS BARBAE 91/47/8295  . SARCOIDOSIS 02/20/2007  . Hyperlipidemia associated with type 2 diabetes mellitus ([place]) 02/20/2007  . DM (diabetes mellitus), type 2 with renal complications ([place]) 62/13/0865  . ERECTILE DYSFUNCTION 10/18/2006  . 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 Essential hypertension 10/18/2006  . CKD (chronic kidney disease) stage 3, GFR 30-59 ml/min ([place]) 09/05/2006    Past Surgical History:  Procedure Laterality Date  . COLONOSCOPY W/ POLYPECTOMY    . ERCP  2006   Normal  . [place]   x2 'inguinal  . LIPOMA EXCISION  06/22/2011   Procedure: EXCISION LIPOMA;  Surgeon: Rolm Bookbinder, MD;  Location: [facility];  Service: General;  Laterality: N/A;  . TOTAL KNEE ARTHROPLASTY Left 05/15/2017   Procedure: LEFT TOTAL KNEE ARTHROPLASTY;  Surgeon: Leandrew Koyanagi, MD;  Location: [facility];  Service: Orthopedics;  Laterality: Left;  . TRANSTHORACIC ECHOCARDIOGRAM  2005   EF 60%       Home Medications    Prior to Admission medications   Medication Sig Start Date End Date Taking? Authorizing Provider  acetaminophen (TYLENOL) 650 MG CR tablet Take 650 mg by mouth daily as needed for pain.    [provider]  allopurinol (ZYLOPRIM) 100  MG tablet Take 2 tablets (200 mg total) by mouth daily. 04/30/17   Rogue Bussing, MD  amLODipine (NORVASC) 10 MG tablet Take 1 tablet (10 mg total) by mouth daily. 01/28/17   Rogue Bussing, MD  aspirin EC 325 MG tablet Take 1 tablet (325 mg total) 2 (two) times daily by mouth. 05/15/17   Leandrew Koyanagi, MD  Blood Glucose Monitoring Suppl (BLOOD GLUCOSE METER) kit Use to test fasting glucose daily.  Diagnosis Type II diabetes 250.0 04/14/12   Luetta Nutting, DO  cyclobenzaprine (FLEXERIL) 10 MG  tablet Take 1 tablet (10 mg total) by mouth 3 (three) times daily as needed for muscle spasms. Patient not taking: Reported on 05/06/2017 01/24/17   Rogue Bussing, MD  docusate sodium (COLACE) 100 MG capsule Take 100 mg by mouth daily.    [provider]  enalapril (VASOTEC) 20 MG tablet Take 2 tablets (40 mg total) by mouth daily. 01/28/17   Rogue Bussing, MD  glucose blood (TRUE METRIX BLOOD GLUCOSE TEST) test strip USE TO TEST BLOOD SUGAR TWICE DAILY FOR TYPE II  DIABETES 01/28/17   Rogue Bussing, MD  levothyroxine (SYNTHROID, LEVOTHROID) 75 MCG tablet Take 1 tablet (75 mcg total) by mouth daily. 01/28/17   Rogue Bussing, MD  lovastatin (MEVACOR) 40 MG tablet Take 1 tablet (40 mg total) by mouth at bedtime. 01/28/17   Rogue Bussing, MD  mupirocin ointment [facility]) 2 % 1 application to the right great toe twice daily Patient not taking: Reported on 05/06/2017 07/31/16   Archie Patten, MD  ondansetron (ZOFRAN) 4 MG tablet Take 1-2 tablets (4-8 mg total) every 8 (eight) hours as needed by mouth for nausea or vomiting. 05/15/17   Leandrew Koyanagi, MD  oxyCODONE (OXY IR/ROXICODONE) 5 MG immediate release tablet Take 1-3 tablets (5-15 mg total) every 4 (four) hours as needed by mouth. 05/15/17   Leandrew Koyanagi, MD  oxyCODONE (OXYCONTIN) 10 mg 12 hr tablet Take 1 tablet (10 mg total) every 12 (twelve) hours by mouth. 05/15/17   Leandrew Koyanagi, MD  oxyCODONE-acetaminophen (PERCOCET/ROXICET) 5-325 MG tablet Take 1 tablet every 4 (four) hours as needed by mouth for severe pain. 05/22/17   Leandrew Koyanagi, MD  pioglitazone (ACTOS) 30 MG tablet Take 1 tablet (30 mg total) by mouth daily. 01/28/17   Rogue Bussing, MD  polyethylene glycol [facility]) packet Take 17 g by mouth daily. 06/01/17   Elnora Morrison, MD  PRODIGY LANCETS 26G MISC Use to check fasting blood glucose each morning.  Diagnosis: Diabetes Mellitus Type II 250.00 03/30/12    Luetta Nutting, DO  promethazine (PHENERGAN) 25 MG tablet Take 1 tablet (25 mg total) every 6 (six) hours as needed by mouth for nausea. 05/15/17   Leandrew Koyanagi, MD  senna-docusate (SENOKOT S) 8.6-50 MG tablet Take 1 tablet at bedtime as needed by mouth. 05/15/17   Leandrew Koyanagi, MD  tiZANidine (ZANAFLEX) 4 MG tablet Take 1 tablet (4 mg total) every 6 (six) hours as needed by mouth for muscle spasms. 05/15/17   Leandrew Koyanagi, MD  traMADol (ULTRAM) 50 MG tablet Take 1 tablet (50 mg total) by mouth every 8 (eight) hours as needed. Patient not taking: Reported on 05/21/2017 04/29/17   Rogue Bussing, MD  traZODone (DESYREL) 100 MG tablet Take 1 tablet (100 mg total) by mouth at bedtime. Patient not taking: Reported on 05/06/2017 01/25/17   Rogue Bussing, MD  traZODone (DESYREL) 100 MG tablet Take 1 tablet (100 mg total) by mouth at bedtime. Patient not taking: Reported on 05/06/2017 01/28/17   Rogue Bussing, MD  triamcinolone cream (KENALOG) 0.1 % Apply 1 application topically 2 (two) times daily. 12/19/15   Archie Patten, MD    Family History Family History  Problem Relation Age of Onset  . Bone cancer Father   . Cancer Father 55       bone  . Uterine cancer Mother   . Cancer Mother   . Heart attack Brother   . Kidney disease Brother   . Diabetes Brother   . Arthritis Brother   . Heart disease Sister   . Congestive Heart Failure Sister   . Congestive Heart Failure Sister   . Congestive Heart Failure Sister   . Diabetes Sister   . Arthritis Sister   . Colon cancer Neg Hx   . Rectal cancer Neg Hx   . Stomach cancer Neg Hx     Social History Social History   Tobacco Use  . Smoking status: Never Smoker  . Smokeless tobacco: Never Used  Substance Use Topics  . Alcohol use: No    Alcohol/week: 0.6 oz    Types: 1 Glasses of wine per week  . Drug use: No     Allergies   Metoprolol succinate and Penicillins   Review of Systems Review of  Systems  Constitutional: Negative for chills and fever.  HENT: Negative for congestion.   Gastrointestinal: Positive for constipation. Negative for abdominal pain, blood in stool and vomiting.  Genitourinary: Negative for dysuria.  Musculoskeletal: Negative for back pain, neck pain and neck stiffness.  Skin: Negative for rash.  Neurological: Negative for light-headedness and headaches.     Physical Exam Updated Vital Signs BP 131/67   Pulse 71   Temp 99 F (37.2 C) (Oral)   Resp 16   SpO2 100%   Physical Exam  Constitutional: He appears well-developed and well-nourished.  HENT:  Head: Normocephalic and atraumatic.  Eyes: Conjunctivae are normal.  Neck: Neck supple.  Cardiovascular: Normal rate and regular rhythm.  No murmur heard. Abdominal: Soft. There is no tenderness.  Genitourinary:  Genitourinary Comments: Patient has firm stool approximately 4 cm inside rectal region. No blood.  Musculoskeletal: He exhibits no edema.  Neurological: He is alert.  Skin: Skin is warm and dry.  Psychiatric: He has a normal mood and affect.  Nursing note and vitals reviewed.    ED Treatments / Results  Labs (all labs ordered are listed, but only abnormal results are displayed) Labs Reviewed - No data to display  EKG  EKG Interpretation None       Radiology No results found.  Procedures Fecal disimpaction Date/Time: 06/01/2017 6:39 PM Performed by: Elnora Morrison, MD Authorized by: Elnora Morrison, MD  Consent: Verbal consent obtained. Risks and benefits: risks, benefits and alternatives were discussed Consent given by: patient Patient understanding: patient states understanding of the procedure being performed Local anesthesia used: no  Anesthesia: Local anesthesia used: no  Sedation: Patient sedated: no  Patient tolerance: Patient tolerated the procedure well with no immediate complications Comments: Fecal disimpaction from constipation.    (including  critical care time)  Medications Ordered in ED Medications  polyethylene glycol (MIRALAX / GLYCOLAX) packet 17 g (not administered)  mineral oil enema 1 enema (not administered)  sodium phosphate (FLEET) 7-19 GM/118ML enema 1 enema (1 enema Rectal Given 06/01/17 1824)     Initial Impression /  Assessment and Plan / ED Course  I have reviewed the triage vital signs and the nursing notes.  Pertinent labs & imaging results that were available during my care of the patient were reviewed by me and considered in my medical decision making (see chart for details).     Patient presents with clinical constipation likely secondary to surgery/narcotics. Mild disimpaction along with medicines patient did have small bowel movement. Plan for continued medicine and outpatient follow.  Results and differential diagnosis were discussed with the patient/parent/guardian. Xrays were independently reviewed by myself.  Close follow up outpatient was discussed, comfortable with the plan.   Medications  polyethylene glycol (MIRALAX / GLYCOLAX) packet 17 g (not administered)  mineral oil enema 1 enema (not administered)  sodium phosphate (FLEET) 7-19 GM/118ML enema 1 enema (1 enema Rectal Given 06/01/17 1824)    Vitals:   06/01/17 1717 06/01/17 1730 06/01/17 1745 06/01/17 1800  BP: (!) 141/76 127/71  131/67  Pulse: 74 71 73 71  Resp:      Temp:      TempSrc:      SpO2: 100% 100% 100% 100%    Final diagnoses:  Constipation, unspecified constipation type    Final Clinical Impressions(s) / ED Diagnoses   Final diagnoses:  Constipation, unspecified constipation type    ED Discharge Orders        Ordered    polyethylene glycol (MIRALAX / GLYCOLAX) packet  Daily     06/01/17 1836       Elnora Morrison, MD 06/01/17 1839

## 2017-06-03 ENCOUNTER — Ambulatory Visit (INDEPENDENT_AMBULATORY_CARE_PROVIDER_SITE_OTHER): Payer: Medicare HMO | Admitting: Orthopaedic Surgery

## 2017-06-03 DIAGNOSIS — M1712 Unilateral primary osteoarthritis, left knee: Secondary | ICD-10-CM

## 2017-06-03 MED ORDER — TIZANIDINE HCL 4 MG PO TABS: 4.0000 mg | ORAL_TABLET | Freq: Four times a day (QID) | ORAL | 2 refills | Status: DC | PRN

## 2017-06-03 NOTE — Progress Notes (Signed)
Patient is 3 weeks status post left total knee replacement.  He denies any pain.  From health physical therapy.  He is taking muscle relaxers and is requesting a refill.  His incision is healed without any signs of infection.  He has minimal swelling.  He does have expected postoperative bruising.  Range of motion is 5 degrees to 70 degrees.  Tizanidine was refilled today.  Referral to outpatient physical therapy for continued strengthening and range of motion.  Follow-up in 4 weeks with 2 view x-rays of the left knee.  Continue with aspirin for DVT prophylaxis

## 2017-06-06 ENCOUNTER — Ambulatory Visit: Payer: Self-pay | Admitting: *Deleted

## 2017-06-07 ENCOUNTER — Other Ambulatory Visit: Payer: Self-pay | Admitting: *Deleted

## 2017-06-07 NOTE — Patient Outreach (Signed)
[name] Care Management  06/07/2017  Andrew [name] 06-Jan-1956 [id number]  Transition of care call #3:  Telephone call to patient who advised that he was not available to talk now & requested call back next week.   Plan: Will follow up next week. Appointment set with patient agreement.   Sherrin Daisy, RN BSN [place] Management Coordinator [facility] Care Management  [phone]

## 2017-06-10 ENCOUNTER — Other Ambulatory Visit: Payer: Self-pay | Admitting: *Deleted

## 2017-06-10 NOTE — Patient Outreach (Signed)
[name]) Care Management  06/10/2017  Andrew [name] [birth year] [id number]  Transition of care call:  Telephone call to patient; left message on voice mail requesting call back.  Plan: Will follow up.  Sherrin Daisy, RN BSN [place] Management Coordinator [facility] Care Management  [phone]

## 2017-06-11 ENCOUNTER — Other Ambulatory Visit: Payer: Self-pay | Admitting: *Deleted

## 2017-06-11 DIAGNOSIS — R262 Difficulty in walking, not elsewhere classified: Secondary | ICD-10-CM | POA: Diagnosis not present

## 2017-06-11 DIAGNOSIS — M25662 Stiffness of left knee, not elsewhere classified: Secondary | ICD-10-CM | POA: Diagnosis not present

## 2017-06-11 DIAGNOSIS — M25462 Effusion, left knee: Secondary | ICD-10-CM | POA: Diagnosis not present

## 2017-06-11 DIAGNOSIS — R6889 Other general symptoms and signs: Secondary | ICD-10-CM | POA: Diagnosis not present

## 2017-06-11 DIAGNOSIS — M25562 Pain in left knee: Secondary | ICD-10-CM | POA: Diagnosis not present

## 2017-06-11 NOTE — Telephone Encounter (Unsigned)
This encounter was created in error - please disregard.

## 2017-06-12 ENCOUNTER — Other Ambulatory Visit: Payer: Self-pay | Admitting: *Deleted

## 2017-06-12 ENCOUNTER — Telehealth (INDEPENDENT_AMBULATORY_CARE_PROVIDER_SITE_OTHER): Payer: Self-pay | Admitting: Orthopaedic Surgery

## 2017-06-12 NOTE — Telephone Encounter (Signed)
Please advise 

## 2017-06-12 NOTE — Telephone Encounter (Signed)
Called patient to advise  °

## 2017-06-12 NOTE — Telephone Encounter (Signed)
Patient called and left vm wanting to know when can he drive. Had hip replacement surgery and forgot to ask XU when. Please call patient

## 2017-06-12 NOTE — Patient Outreach (Signed)
[facility]) Care Management  06/12/2017  Damione Robideau [birth year] [id number]   Transition of care: Telephone call to patient; left message on voice mail requesting return call.  Plan: Will follow up.  Sherrin Daisy, RN BSN [place] Management Coordinator [facility] Care Management  [phone]

## 2017-06-12 NOTE — Telephone Encounter (Signed)
May drive now if he's not taking narcotic pain meds

## 2017-06-13 ENCOUNTER — Encounter: Payer: Self-pay | Admitting: *Deleted

## 2017-06-13 ENCOUNTER — Other Ambulatory Visit: Payer: Self-pay | Admitting: *Deleted

## 2017-06-13 NOTE — Patient Outreach (Signed)
[facility]) Care Management  06/13/2017   Andrew [name] Sep 08, 1955 [id number]   Referral from [facility]; patient discharged from inpatient admission from [facility] 05/17/2017.   Received return call from patient after 3 unsuccessful follow up transition call attempts. HIPPA verification received from patient.    Subjective/Objective:  Patient voices that he has not had readmission to hospital since recent knee replacement. States he had one emergency room visit for constipation but no problems now. States it was caused by pain medication which he is not taking now. Advised patient of importance of adequate fluid intake & activity and he voices understanding.  States he currently is in outpatient therapy several times weekly and does  exercises at home as instructed by his physical therapist.   Voices he completed follow up visit with orthopedist 11.26 and has appointment with primary care in January 2019.   Reviewed reportable signs & symptoms with patient. States he understands & has MD phones numbers available as needed. States he would call 911 for emergency situation.   Assessment:  States he has support from son & uses insurance benefit for transportation to MD appointments. States he has not been released to drive yet.  No health concerns voices by patient. States he is getting along well now.    Current Medications:  Current Outpatient Medications  Medication Sig Dispense Refill  . acetaminophen (TYLENOL) 650 MG CR tablet Take 650 mg by mouth daily as needed for pain.    Marland Kitchen allopurinol (ZYLOPRIM) 100 MG tablet Take 2 tablets (200 mg total) by mouth daily. 180 tablet 3  . amLODipine (NORVASC) 10 MG tablet Take 1 tablet (10 mg total) by mouth daily. 90 tablet 3  . aspirin EC 325 MG tablet Take 1 tablet (325 mg total) 2 (two) times daily by mouth. 84 tablet 0  . Blood Glucose Monitoring Suppl (BLOOD GLUCOSE METER) kit Use to test fasting  glucose daily.  Diagnosis Type II diabetes 250.0 1 each 0  . cyclobenzaprine (FLEXERIL) 10 MG tablet Take 1 tablet (10 mg total) by mouth 3 (three) times daily as needed for muscle spasms. (Patient not taking: Reported on 05/06/2017) 20 tablet 2  . docusate sodium (COLACE) 100 MG capsule Take 100 mg by mouth daily.    . enalapril (VASOTEC) 20 MG tablet Take 2 tablets (40 mg total) by mouth daily. 180 tablet 3  . glucose blood (TRUE METRIX BLOOD GLUCOSE TEST) test strip USE TO TEST BLOOD SUGAR TWICE DAILY FOR TYPE II  DIABETES 200 each 5  . levothyroxine (SYNTHROID, LEVOTHROID) 75 MCG tablet Take 1 tablet (75 mcg total) by mouth daily. 90 tablet 3  . lovastatin (MEVACOR) 40 MG tablet Take 1 tablet (40 mg total) by mouth at bedtime. 90 tablet 3  . mupirocin ointment (BACTROBAN) 2 % 1 application to the right great toe twice daily (Patient not taking: Reported on 05/06/2017) 22 g 0  . ondansetron (ZOFRAN) 4 MG tablet Take 1-2 tablets (4-8 mg total) every 8 (eight) hours as needed by mouth for nausea or vomiting. 40 tablet 0  . oxyCODONE (OXY IR/ROXICODONE) 5 MG immediate release tablet Take 1-3 tablets (5-15 mg total) every 4 (four) hours as needed by mouth. 30 tablet 0  . oxyCODONE (OXYCONTIN) 10 mg 12 hr tablet Take 1 tablet (10 mg total) every 12 (twelve) hours by mouth. 10 tablet 0  . oxyCODONE-acetaminophen (PERCOCET/ROXICET) 5-325 MG tablet Take 1 tablet every 4 (four) hours as needed by mouth for  severe pain. 30 tablet 0  . pioglitazone (ACTOS) 30 MG tablet Take 1 tablet (30 mg total) by mouth daily. 90 tablet 3  . polyethylene glycol (MIRALAX / GLYCOLAX) packet Take 17 g by mouth daily. 14 each 0  . PRODIGY LANCETS 26G MISC Use to check fasting blood glucose each morning.  Diagnosis: Diabetes Mellitus Type II 250.00 100 each 12  . promethazine (PHENERGAN) 25 MG tablet Take 1 tablet (25 mg total) every 6 (six) hours as needed by mouth for nausea. 30 tablet 1  . senna-docusate (SENOKOT S) 8.6-50  MG tablet Take 1 tablet at bedtime as needed by mouth. 30 tablet 1  . tiZANidine (ZANAFLEX) 4 MG tablet Take 1 tablet (4 mg total) every 6 (six) hours as needed by mouth for muscle spasms. 30 tablet 2  . tiZANidine (ZANAFLEX) 4 MG tablet Take 1 tablet (4 mg total) by mouth every 6 (six) hours as needed for muscle spasms. 30 tablet 2  . traMADol (ULTRAM) 50 MG tablet Take 1 tablet (50 mg total) by mouth every 8 (eight) hours as needed. (Patient not taking: Reported on 05/21/2017) 30 tablet 0  . traZODone (DESYREL) 100 MG tablet Take 1 tablet (100 mg total) by mouth at bedtime. (Patient not taking: Reported on 05/06/2017) 90 tablet 3  . traZODone (DESYREL) 100 MG tablet Take 1 tablet (100 mg total) by mouth at bedtime. (Patient not taking: Reported on 05/06/2017) 90 tablet 3  . triamcinolone cream (KENALOG) 0.1 % Apply 1 application topically 2 (two) times daily. 30 g 1   Current Facility-Administered Medications  Medication Dose Route Frequency Provider Last Rate Last Dose  . betamethasone acetate-betamethasone sodium phosphate (CELESTONE) injection 3 mg  3 mg Intramuscular Once Edrick Kins, DPM        Functional Status:  In your present state of health, do you have any difficulty performing the following activities: 06/13/2017 05/21/2017  Hearing? N N  Vision? N N  Difficulty concentrating or making decisions? N N  Walking or climbing stairs? Y Y  Comment - -  Dressing or bathing? N N  Doing errands, shopping? Y Y  Preparing Food and eating ? - -  Using the Toilet? - -  In the past six months, have you accidently leaked urine? - -  Do you have problems with loss of bowel control? - -  Managing your Medications? - -  Managing your Finances? - -  Housekeeping or managing your Housekeeping? - -  Some recent data might be hidden    Fall/Depression Screening: Fall Risk  06/13/2017 06/13/2017 05/21/2017  Falls in the past year? - No No  Number falls in past yr: - - -  Injury with Fall?  - - -  Risk for fall due to : (No Data) Impaired mobility Impaired mobility;Medication side effect  Risk for fall due to: Comment uses walker-following knee replacemnt - -   PHQ 2/9 Scores 05/21/2017 04/29/2017 01/24/2017 12/19/2016 07/31/2016 05/15/2016 12/26/2015  PHQ - 2 Score 0 0 0 0 0 0 0  PHQ- 9 Score - - - - - 0 -  Exception Documentation - - - - - - -    Plan:  [facility] CM Care Plan Problem One     Most Recent Value  Care Plan Problem One  At risk for readmission folllowing recent admission for knee replacement  Role Documenting the Problem One  Care Management [place] for Problem One  Active  [facility] Long Term Goal  No readmission within 30 days of previous admission   [facility] Long Term Goal Start Date  05/21/17  Interventions for Problem One Long Term Goal  Case manager will teach patient importance of MD follow up, early reporting of sxs that require MD notification, signs of complications [Reenforcement of early reporting of abn sxs, exercise per PT, ]  [facility] CM Short Term Goal #1   Pt will state 3 abnormal post op sxs that require MD notification within 2 weeks  [facility] CM Short Term Goal #1 Start Date  05/21/17  [facility] CM Short Term Goal #1 Met Date  06/13/17  Interventions for Short Term Goal #1  re-enforcement of  teaching points  [facility] CM Short Term Goal #2   Patient will report appointment dates for MD hospital follow up within 2 weeks   [facility] CM Short Term Goal #2 Start Date  05/21/17  [facility] CM Short Term Goal #2 Met Date  06/13/17  Interventions for Short Term Goal #2  Advise patient of importance of  making appointment & attending hospital follow up with MD [ptt has made f/u appt with orthopedist, has called PCP office]     Will follow up next week. Patient agrees with set appointment.  Sherrin Daisy, RN BSN [place] Management Coordinator [facility] Care Management  [phone]

## 2017-06-14 DIAGNOSIS — M25662 Stiffness of left knee, not elsewhere classified: Secondary | ICD-10-CM | POA: Diagnosis not present

## 2017-06-14 DIAGNOSIS — R262 Difficulty in walking, not elsewhere classified: Secondary | ICD-10-CM | POA: Diagnosis not present

## 2017-06-14 DIAGNOSIS — M25562 Pain in left knee: Secondary | ICD-10-CM | POA: Diagnosis not present

## 2017-06-14 DIAGNOSIS — R6889 Other general symptoms and signs: Secondary | ICD-10-CM | POA: Diagnosis not present

## 2017-06-14 DIAGNOSIS — M25462 Effusion, left knee: Secondary | ICD-10-CM | POA: Diagnosis not present

## 2017-06-19 ENCOUNTER — Other Ambulatory Visit: Payer: Self-pay | Admitting: *Deleted

## 2017-06-19 DIAGNOSIS — R262 Difficulty in walking, not elsewhere classified: Secondary | ICD-10-CM | POA: Diagnosis not present

## 2017-06-19 DIAGNOSIS — M25462 Effusion, left knee: Secondary | ICD-10-CM | POA: Diagnosis not present

## 2017-06-19 DIAGNOSIS — M25662 Stiffness of left knee, not elsewhere classified: Secondary | ICD-10-CM | POA: Diagnosis not present

## 2017-06-19 DIAGNOSIS — M25562 Pain in left knee: Secondary | ICD-10-CM | POA: Diagnosis not present

## 2017-06-19 NOTE — Patient Outreach (Signed)
[facility]) Care Management  06/19/2017  Andrewjames Weirauch December 16, 1955 [id number]  Final Transition of Care call:  Telephone call to patient who advises that he feels "great." States he is doing outpatient therapy 3 days/week and plans to stop using cane soon.  Voices that he has been released to drive and is having no problems with that.   Voices he has had no further problems with constipation and continues to each vegetables, drink plenty of liquids (which includes prune juice and keep active.  Patient has had no readmissions since recent knee replacement .  Care plan goals have been met. Patient agrees ready for case closure. Has no further concerns.  Plan: Send MD case closure letter. Send to care management assistant for case closure.   Sherrin Daisy, RN BSN [place] Management Coordinator [facility] Care Management  [phone]

## 2017-06-21 DIAGNOSIS — M25562 Pain in left knee: Secondary | ICD-10-CM | POA: Diagnosis not present

## 2017-06-21 DIAGNOSIS — R262 Difficulty in walking, not elsewhere classified: Secondary | ICD-10-CM | POA: Diagnosis not present

## 2017-06-21 DIAGNOSIS — M25662 Stiffness of left knee, not elsewhere classified: Secondary | ICD-10-CM | POA: Diagnosis not present

## 2017-06-21 DIAGNOSIS — M25462 Effusion, left knee: Secondary | ICD-10-CM | POA: Diagnosis not present

## 2017-06-24 ENCOUNTER — Telehealth: Payer: Self-pay | Admitting: Internal Medicine

## 2017-06-24 NOTE — Telephone Encounter (Signed)
Pt left a Handicap form renewal upfront to be fill up by his MD. Form in the [employer]. [phone]

## 2017-06-24 NOTE — Telephone Encounter (Signed)
Clinical info completed on handicap placard form.  Place form in Dr. Lowell Bouton box for completion.  Andrew [name], Andrew [name]

## 2017-06-25 DIAGNOSIS — M25662 Stiffness of left knee, not elsewhere classified: Secondary | ICD-10-CM | POA: Diagnosis not present

## 2017-06-25 DIAGNOSIS — R262 Difficulty in walking, not elsewhere classified: Secondary | ICD-10-CM | POA: Diagnosis not present

## 2017-06-25 DIAGNOSIS — M25462 Effusion, left knee: Secondary | ICD-10-CM | POA: Diagnosis not present

## 2017-06-25 DIAGNOSIS — M25562 Pain in left knee: Secondary | ICD-10-CM | POA: Diagnosis not present

## 2017-06-25 NOTE — Telephone Encounter (Signed)
Patient notified that form is ready for pick up in front office. Copy placed in scan box. Hubbard Hartshorn, RN, BSN

## 2017-06-26 ENCOUNTER — Other Ambulatory Visit: Payer: Self-pay

## 2017-06-26 ENCOUNTER — Encounter: Payer: Self-pay | Admitting: *Deleted

## 2017-06-26 ENCOUNTER — Ambulatory Visit: Payer: Medicare HMO | Admitting: Internal Medicine

## 2017-06-26 ENCOUNTER — Encounter: Payer: Self-pay | Admitting: Internal Medicine

## 2017-06-26 VITALS — BP 122/60 | HR 90 | Temp 98.3°F | Wt 215.0 lb

## 2017-06-26 DIAGNOSIS — E1122 Type 2 diabetes mellitus with diabetic chronic kidney disease: Secondary | ICD-10-CM | POA: Diagnosis not present

## 2017-06-26 DIAGNOSIS — M25562 Pain in left knee: Secondary | ICD-10-CM | POA: Diagnosis not present

## 2017-06-26 DIAGNOSIS — R262 Difficulty in walking, not elsewhere classified: Secondary | ICD-10-CM | POA: Diagnosis not present

## 2017-06-26 DIAGNOSIS — M25512 Pain in left shoulder: Secondary | ICD-10-CM | POA: Diagnosis not present

## 2017-06-26 DIAGNOSIS — M25462 Effusion, left knee: Secondary | ICD-10-CM | POA: Diagnosis not present

## 2017-06-26 DIAGNOSIS — M25662 Stiffness of left knee, not elsewhere classified: Secondary | ICD-10-CM | POA: Diagnosis not present

## 2017-06-26 MED ORDER — DICLOFENAC SODIUM 1 % TD GEL: 4.0000 g | Freq: Four times a day (QID) | TRANSDERMAL | 2 refills | Status: DC

## 2017-06-26 NOTE — Patient Instructions (Signed)
Mr. Legault,  I'm glad you are starting to get around more!  For your shoulder, try voltaren gel a few times a day to help with inflammation. Also use ice. Try some of these exercises.  Please see me back in the New Year for blood sugar check.  Try antibacterial ointment before bed on the spot on your nose. If it spreads, I would consider draining it.  Best, Dr. Ola Spurr

## 2017-06-26 NOTE — Progress Notes (Signed)
[facility] Progress Note  Subjective:  Andrew [name] is a 61 y.o. male with history of T2DM and recent left total knee arthroplasty 05/15/2017. He presents for follow-up of diabetes and complaint of bump on nose and left shoulder pain.   #T2DM: - Takes actos 30 mg. Not on metformin due to CKD.  - Has seen Dr. Gershon Crane recently for eye exam - On enalapril - wants to go the gym and build up his muscle tone - last A1c 6.8 on 04/29/17 ROS: Denies vision changes  #Left shoulder pain: - Hard to raise it above his head after he had surgery on his knee. He wonders if he was in an odd position while under anesthesia - Says range of motion has been improving with time - History of left shoulder impingement  #Bump on nose: - Noticed bump on left side of his nose. Thinks it could have been from wearing a mask when had anesthesia - Has not noticed any drainage and thinks it is getting smaller ROS: No fevers  #Recent left knee replacement: - Working with PT and has 5 more sessions - Now using just cane from walker - Received handicap placard paperwork I prepared for him - plans to hold off on surgery of R knee for as long as he can  Allergies  Allergen Reactions  . Metoprolol Succinate     REACTION: bradycardia (HR to 42)  . Penicillins Itching    Has patient had a PCN reaction causing immediate rash, facial/tongue/throat swelling, SOB or lightheadedness with hypotension: No Has patient had a PCN reaction causing severe rash involving mucus membranes or skin necrosis: No Has patient had a PCN reaction that required hospitalization No Has patient had a PCN reaction occurring within the last 10 years: No If all of the above answers are "NO", then may proceed with Cephalosporin use.    Social History   Tobacco Use  . Smoking status: Never Smoker  . Smokeless tobacco: Never Used  Substance Use Topics  . Alcohol use: No    Alcohol/week: 0.6 oz    Types: 1 Glasses  of wine per week    Objective: Blood pressure 122/60, pulse 90, temperature 98.3 F (36.8 C), temperature source Oral, weight 215 lb (97.5 kg), SpO2 99 %. Body mass index is 28.37 kg/m. Constitutional: Well appearing male in NAD, pleasant HENT: Small, slightly erythematous firm bump on side of L nose with one small area of fluctuance Cardiovascular: RRR, S1, S2, no m/r/g.  Pulmonary/Chest: Effort normal and breath sounds normal.  Musculoskeletal: 1+ LE edema of L leg. Well healing longitudinal scar of L knee. Positive Empty Can test on left. Cannot reach shoulder blade, reaching with his L arm.  Neurological: AOx3, no focal deficits. Psychiatric: Normal mood and affect.  Vitals reviewed  Assessment/Plan: DM (diabetes mellitus), type 2 with renal complications - Continue actos. A1c < 7.5 when last checked. Next due in 1-2 months. - Will request Ophtho report from Dr. Gershon Crane - Now following with nephrology and on enalapril  Left shoulder pain - Mobility improving per patient. Suspect impingement vs supraspinatous tendonitis given positive empty can test.  - Recommended diclofenac gel, as patient with CKD3 and should avoid regular NSAID use. Can use tylenol 500 mg prn. Could also try capsaicin cream or icy hot to distract from discomfort. - Provided handout on pendulum swing exercise and wall "walk"  For bump on nose, provided a few packets of bactroban to apply nightly. Did not I&D  because mostly firm and decreasing in size, per patient.  Follow-up in 1-2 months for A1c recheck.  Olene Floss, MD [place], PGY-3

## 2017-06-28 ENCOUNTER — Telehealth: Payer: Self-pay | Admitting: *Deleted

## 2017-06-28 ENCOUNTER — Telehealth: Payer: Self-pay | Admitting: Internal Medicine

## 2017-06-28 DIAGNOSIS — R262 Difficulty in walking, not elsewhere classified: Secondary | ICD-10-CM | POA: Diagnosis not present

## 2017-06-28 DIAGNOSIS — M25662 Stiffness of left knee, not elsewhere classified: Secondary | ICD-10-CM | POA: Diagnosis not present

## 2017-06-28 DIAGNOSIS — M25462 Effusion, left knee: Secondary | ICD-10-CM | POA: Diagnosis not present

## 2017-06-28 DIAGNOSIS — M25562 Pain in left knee: Secondary | ICD-10-CM | POA: Diagnosis not present

## 2017-06-28 NOTE — Telephone Encounter (Signed)
Patient left message on nurse line requesting refill on tramadol as med prescribed by PCP at last OV is not covered by his insurance. [facility]ses [facility]. Hubbard Hartshorn, RN, BSN

## 2017-06-28 NOTE — Telephone Encounter (Signed)
Patient came to office request refill on RX Tramadol. Please call and let patient know. [phone]. [facility]acy [facility] [address]

## 2017-06-28 NOTE — Telephone Encounter (Signed)
Called patient. Recommended trying icy hot or capsaicin cream as topical treatment for shoulder pain. Can also try tylenol. Did not recommend continuing tramadol, as he is several weeks past his knee surgery and has had bad constipation with opioids in the past. Will await prior auth for voltaren gel.

## 2017-06-30 ENCOUNTER — Encounter: Payer: Self-pay | Admitting: Internal Medicine

## 2017-06-30 DIAGNOSIS — M25512 Pain in left shoulder: Secondary | ICD-10-CM | POA: Insufficient documentation

## 2017-06-30 NOTE — Assessment & Plan Note (Signed)
-   Mobility improving per patient. Suspect impingement vs supraspinatous tendonitis given positive empty can test.  - Recommended diclofenac gel, as patient with CKD3 and should avoid regular NSAID use. Can use tylenol 500 mg prn. Could also try capsaicin cream or icy hot to distract from discomfort. - Provided handout on pendulum swing exercise and wall "walk"

## 2017-06-30 NOTE — Assessment & Plan Note (Signed)
-   Continue actos. A1c < 7.5 when last checked. Next due in 1-2 months. - Will request Ophtho report from Dr. Gershon Crane - Now following with nephrology and on enalapril

## 2017-07-01 DIAGNOSIS — R262 Difficulty in walking, not elsewhere classified: Secondary | ICD-10-CM | POA: Diagnosis not present

## 2017-07-01 DIAGNOSIS — M25662 Stiffness of left knee, not elsewhere classified: Secondary | ICD-10-CM | POA: Diagnosis not present

## 2017-07-01 DIAGNOSIS — M25462 Effusion, left knee: Secondary | ICD-10-CM | POA: Diagnosis not present

## 2017-07-01 DIAGNOSIS — M25562 Pain in left knee: Secondary | ICD-10-CM | POA: Diagnosis not present

## 2017-07-03 DIAGNOSIS — M25662 Stiffness of left knee, not elsewhere classified: Secondary | ICD-10-CM | POA: Diagnosis not present

## 2017-07-03 DIAGNOSIS — R262 Difficulty in walking, not elsewhere classified: Secondary | ICD-10-CM | POA: Diagnosis not present

## 2017-07-03 DIAGNOSIS — M25562 Pain in left knee: Secondary | ICD-10-CM | POA: Diagnosis not present

## 2017-07-03 DIAGNOSIS — M25462 Effusion, left knee: Secondary | ICD-10-CM | POA: Diagnosis not present

## 2017-07-05 DIAGNOSIS — R262 Difficulty in walking, not elsewhere classified: Secondary | ICD-10-CM | POA: Diagnosis not present

## 2017-07-05 DIAGNOSIS — M25462 Effusion, left knee: Secondary | ICD-10-CM | POA: Diagnosis not present

## 2017-07-05 DIAGNOSIS — M25562 Pain in left knee: Secondary | ICD-10-CM | POA: Diagnosis not present

## 2017-07-05 DIAGNOSIS — M25662 Stiffness of left knee, not elsewhere classified: Secondary | ICD-10-CM | POA: Diagnosis not present

## 2017-07-08 ENCOUNTER — Ambulatory Visit (INDEPENDENT_AMBULATORY_CARE_PROVIDER_SITE_OTHER): Payer: Medicare HMO

## 2017-07-08 ENCOUNTER — Ambulatory Visit (INDEPENDENT_AMBULATORY_CARE_PROVIDER_SITE_OTHER): Payer: Medicare HMO | Admitting: Orthopaedic Surgery

## 2017-07-08 ENCOUNTER — Encounter (INDEPENDENT_AMBULATORY_CARE_PROVIDER_SITE_OTHER): Payer: Self-pay | Admitting: Orthopaedic Surgery

## 2017-07-08 DIAGNOSIS — Z96652 Presence of left artificial knee joint: Secondary | ICD-10-CM

## 2017-07-08 DIAGNOSIS — M25562 Pain in left knee: Secondary | ICD-10-CM | POA: Diagnosis not present

## 2017-07-08 DIAGNOSIS — R262 Difficulty in walking, not elsewhere classified: Secondary | ICD-10-CM | POA: Diagnosis not present

## 2017-07-08 DIAGNOSIS — M25462 Effusion, left knee: Secondary | ICD-10-CM | POA: Diagnosis not present

## 2017-07-08 DIAGNOSIS — M25662 Stiffness of left knee, not elsewhere classified: Secondary | ICD-10-CM | POA: Diagnosis not present

## 2017-07-08 MED ORDER — DICLOFENAC SODIUM 1 % TD GEL: 2.0000 g | Freq: Four times a day (QID) | TRANSDERMAL | Status: DC | PRN

## 2017-07-08 NOTE — Progress Notes (Signed)
Office Visit Note   Patient: Andrew [name]           Date of Birth: 01/05/1956           MRN: [id number] Visit Date: 07/08/2017              Requested by: Rogue Bussing, MD [address] [place], [place] [address] PCP: Rogue Bussing, MD   Assessment & Plan: Visit Diagnoses:  1. Status post total left knee replacement     Plan: At this point would like for Andrew [name] to continue with outpatient physical therapy as well as his home exercise program.  He will follow-up with [place] in 6 weeks time for repeat evaluation.    Follow-Up Instructions: Return in about 6 weeks (around 08/19/2017).   Orders:  Orders Placed This Encounter  Procedures  . XR KNEE 3 VIEW LEFT   No orders of the defined types were placed in this encounter.     Procedures: No procedures performed   Clinical Data: No additional findings.   Subjective: Chief Complaint  Patient presents with  . Left Knee - Pain    HPI is a pleasant 61 year old gentleman who presents to our clinic today nearly 2 months out left total knee replacement date of surgery 05/15/2017.  He has been outpatient physical therapy 3 days a week as well as working on home exercise program.  Overall doing well.  Taken Tylenol for pain.  No systemic symptoms.  Review of Systems As detailed in HPI all others reviewed and are negative.  Objective: Vital Signs: There were no vitals taken for this visit.  Physical Exam developed well-nourished gentleman in no acute distress.  Alert and oriented x3. Ortho Exam examination of his left knee reveals a well-healed surgical incision without infection no effusion. range of motion 0-100 degrees.  Stable valgus varus stress.  He is nervous intact distally.    Specialty Comments:  No specialty comments available.  Imaging: Xr Knee 3 View Left  Result Date: 07/08/2017 Imaging of the left knee reviews a well-seated prosthesis.  No evidence of loosening.    PMFS  History: Patient Active Problem List   Diagnosis Date Noted  . Left shoulder pain 06/30/2017  . Total knee replacement status 05/15/2017  . Chronic gout of multiple sites 05/02/2017  . Left foot pain 10/22/2016  . Contracture of toe, left 10/22/2016  . Healthcare maintenance 04/04/2016  . Osteoarthritis of both knees 12/26/2015  . Foot lesion 03/24/2015  . Bilateral foot pain 10/26/2014  . Impairment of balance 09/02/2014  . History of stroke 09/02/2014  . De Quervain's tenosynovitis, right 11/05/2013  . Pinguecula of both eyes 09/23/2013  . Chronic hyponatremia 09/23/2013  . Hypothyroidism 05/25/2013  . Memory loss 05/21/2013  . Personal history of colonic adenomas 04/01/2013  . Joint pain 05/06/2011  . OBESITY, UNSPECIFIED 05/20/2009  . Knee pain, bilateral 04/28/2009  . LUMBAGO 03/25/2009  . PSEUDOFOLLICULITIS BARBAE 29/92/4268  . SARCOIDOSIS 02/20/2007  . Hyperlipidemia associated with type 2 diabetes mellitus ([place]) 02/20/2007  . DM (diabetes mellitus), type 2 with renal complications ([place]) 34/19/6222  . ERECTILE DYSFUNCTION 10/18/2006  . 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 Essential hypertension 10/18/2006  . CKD (chronic kidney disease) stage 3, GFR 30-59 ml/min ([place]) 09/05/2006   Past Medical History:  Diagnosis Date  . Arthritis   . CRD (chronic renal disease), stage II    Baseline Cr 1.2  . CVA (cerebral vascular accident) ([place])  x4 last one in 2007, R sided residual weakness  . HAMMER TOE 07/25/2010  . HLD (hyperlipidemia)   . HTN (hypertension)   . Hypothyroidism   . Leg swelling   . Lipoma of back 03/18/2011  . Personal history of colonic adenomas 04/01/2013  . Prostatitis    hx of x2  . PSEUDOFOLLICULITIS BARBAE 0/07/930   Qualifier: Diagnosis of  By: Danise Mina  MD, Garlon Hatchet    . Sarcoidosis   . T2DM (type 2 diabetes mellitus) ([place])     Family History  Problem Relation Age of Onset  . Bone cancer Father   . Cancer Father 29        bone  . Uterine cancer Mother   . Cancer Mother   . Heart attack Brother   . Kidney disease Brother   . Diabetes Brother   . Arthritis Brother   . Heart disease Sister   . Congestive Heart Failure Sister   . Congestive Heart Failure Sister   . Congestive Heart Failure Sister   . Diabetes Sister   . Arthritis Sister   . Colon cancer Neg Hx   . Rectal cancer Neg Hx   . Stomach cancer Neg Hx     Past Surgical History:  Procedure Laterality Date  . COLONOSCOPY W/ POLYPECTOMY    . ERCP  2006   Normal  . [place]   x2 'inguinal  . LIPOMA EXCISION  06/22/2011   Procedure: EXCISION LIPOMA;  Surgeon: Rolm Bookbinder, MD;  Location: [facility];  Service: General;  Laterality: N/A;  . TOTAL KNEE ARTHROPLASTY Left 05/15/2017   Procedure: LEFT TOTAL KNEE ARTHROPLASTY;  Surgeon: Leandrew Koyanagi, MD;  Location: [facility];  Service: Orthopedics;  Laterality: Left;  . TRANSTHORACIC ECHOCARDIOGRAM  2005   EF 60%   Social History   Occupational History  . Not on file  Tobacco Use  . Smoking status: Never Smoker  . Smokeless tobacco: Never Used  Substance and Sexual Activity  . Alcohol use: No    Alcohol/week: 0.6 oz    Types: 1 Glasses of wine per week  . Drug use: No  . Sexual activity: Not Currently

## 2017-07-10 DIAGNOSIS — M25662 Stiffness of left knee, not elsewhere classified: Secondary | ICD-10-CM | POA: Diagnosis not present

## 2017-07-10 DIAGNOSIS — M25562 Pain in left knee: Secondary | ICD-10-CM | POA: Diagnosis not present

## 2017-07-10 DIAGNOSIS — R262 Difficulty in walking, not elsewhere classified: Secondary | ICD-10-CM | POA: Diagnosis not present

## 2017-07-10 DIAGNOSIS — M25462 Effusion, left knee: Secondary | ICD-10-CM | POA: Diagnosis not present

## 2017-07-11 DIAGNOSIS — M25462 Effusion, left knee: Secondary | ICD-10-CM | POA: Diagnosis not present

## 2017-07-11 DIAGNOSIS — M25662 Stiffness of left knee, not elsewhere classified: Secondary | ICD-10-CM | POA: Diagnosis not present

## 2017-07-11 DIAGNOSIS — M25562 Pain in left knee: Secondary | ICD-10-CM | POA: Diagnosis not present

## 2017-07-11 DIAGNOSIS — R262 Difficulty in walking, not elsewhere classified: Secondary | ICD-10-CM | POA: Diagnosis not present

## 2017-07-22 ENCOUNTER — Ambulatory Visit: Payer: Medicare HMO | Admitting: Internal Medicine

## 2017-07-26 ENCOUNTER — Other Ambulatory Visit: Payer: Self-pay

## 2017-07-26 ENCOUNTER — Encounter: Payer: Self-pay | Admitting: Internal Medicine

## 2017-07-26 ENCOUNTER — Telehealth: Payer: Self-pay | Admitting: Internal Medicine

## 2017-07-26 ENCOUNTER — Ambulatory Visit (INDEPENDENT_AMBULATORY_CARE_PROVIDER_SITE_OTHER): Payer: Medicare HMO | Admitting: Internal Medicine

## 2017-07-26 ENCOUNTER — Ambulatory Visit (HOSPITAL_COMMUNITY)
Admission: RE | Admit: 2017-07-26 | Discharge: 2017-07-26 | Disposition: A | Discharge status: Home / Self Care | Payer: Medicare HMO | Source: Ambulatory Visit | Attending: Family Medicine | Admitting: Family Medicine

## 2017-07-26 VITALS — BP 118/72 | HR 75 | Temp 98.2°F | Ht 75.0 in | Wt 223.6 lb

## 2017-07-26 DIAGNOSIS — G8929 Other chronic pain: Secondary | ICD-10-CM

## 2017-07-26 DIAGNOSIS — E1122 Type 2 diabetes mellitus with diabetic chronic kidney disease: Secondary | ICD-10-CM | POA: Diagnosis not present

## 2017-07-26 DIAGNOSIS — M25512 Pain in left shoulder: Secondary | ICD-10-CM | POA: Diagnosis not present

## 2017-07-26 DIAGNOSIS — M19012 Primary osteoarthritis, left shoulder: Secondary | ICD-10-CM | POA: Insufficient documentation

## 2017-07-26 LAB — POCT GLYCOSYLATED HEMOGLOBIN (HGB A1C): Hemoglobin A1C: 7.5

## 2017-07-26 MED ORDER — CANAGLIFLOZIN 100 MG PO TABS: 100.0000 mg | ORAL_TABLET | Freq: Every day | ORAL | 2 refills | Status: DC

## 2017-07-26 NOTE — Patient Instructions (Signed)
Mr. Amero,  Stop pioglitazone once you receive the invokana in the mail. You'll take the new medicine in the morning before first meal. Please see me back in a month for labs.  Stay well hydrated to prevent urinary tract infection and dizziness with new diabetes med.  I will call your pharmacy to get voltaren gel approved for your shoulder.  I will call you when you shoulder x-ray results are read.  Best, Dr. Ola Spurr

## 2017-07-26 NOTE — Assessment & Plan Note (Addendum)
-   A1c just at goal at 7.5. Worsened since last two visits with 6.8 and 7.0, respectively. - Will switch from actos to invokana, given former's possible link to bladder cancer and the latter's mortality benefits.  - Ordered invokana 100 mg daily.  - Will need to monitor patient's GFR intermittently - Counseled to stay very well hydrated to reduce potential side effects of dizziness (also on ACEi) and bladder infections - Patient to begin going to the gym regularly

## 2017-07-26 NOTE — Assessment & Plan Note (Signed)
-   Chronic. Limiting patient more recently. Suspect tendinopathy of rotator cuff--seems to have pain rather than actual weakness to suggest tear. Also with significant arthritis history. - Will obtain x-ray of L shoulder to determine possible role of OA. - Will contact patient's pharmacy about PA for voltaren gel. - Suggested trying capsaicin cream in interim. - Recommended reducing reps of shoulder exercises if causing pain but to continue to encourage mobility - Could consider Sports Med referral for [place] of shoulder region

## 2017-07-26 NOTE — Progress Notes (Signed)
[facility] Progress Note  Subjective:  Andrew [name] is a 62 y.o. male who had recent L TKA who presents for ongoing shoulder pain and T2DM follow-up.  #T2DM: - Taking actos 30 mg. Not on metformin due to CKD and not on sulfonylurea due to reports of hypoglycemia - Checks sugars twice a day because it is reassuring to him; remembers most readings in the 130s but as high as 190 - Says he had eye exam with Dr. Gershon Crane within last year - On enalapril - Doing home PT exercises now and has a free gym membership so plans to start going regularly ROS: no increased urinary frequency, no dizziness  #Left shoulder pain: - Ongoing for years but worse after he had knee surgery (thinks his arm may have been positioned in an awkward way) - difficult to close his car door and raise his arm above his head - hurst over upper left arm and back and over lateral shoulder as well - wondering if lipoma removed from upper mid back a couple years ago could be contributing, but says pain preceded procedure - extra strength tylenol helps some - stopped doing shoulder mobility exercises (arm circles, hand wall walks) because seemed to make pain worse  Allergies  Allergen Reactions  . Metoprolol Succinate     REACTION: bradycardia (HR to 42)  . Penicillins Itching    Has patient had a PCN reaction causing immediate rash, facial/tongue/throat swelling, SOB or lightheadedness with hypotension: No Has patient had a PCN reaction causing severe rash involving mucus membranes or skin necrosis: No Has patient had a PCN reaction that required hospitalization No Has patient had a PCN reaction occurring within the last 10 years: No If all of the above answers are "NO", then may proceed with Cephalosporin use.    Social History   Tobacco Use  . Smoking status: Never Smoker  . Smokeless tobacco: Never Used  Substance Use Topics  . Alcohol use: No    Alcohol/week: 0.6 oz    Types: 1 Glasses  of wine per week    Objective: Blood pressure 118/72, pulse 75, temperature 98.2 F (36.8 C), temperature source Oral, height 6\' 3"  (1.905 m), weight 223 lb 9.6 oz (101.4 kg), SpO2 99 %. Body mass index is 27.95 kg/m. Constitutional: Overweight male in NAD Cardiovascular: RRR, S1, S2, no m/r/g.  Musculoskeletal: TTP over entire shoulder region, including over upper left arm and trapezius muscle on L side. Able to raise left arm above shoulder but cannot touch opposite shoulder blade. Positive Empty Can test on L. Negative lift off test on L. Normal ROM of R shoulder. No LE edema. Neurological: AOx3, no focal deficits. Skin: Skin is warm and dry. No rash noted.  Vitals reviewed  - last A1c 6.8 on 04/29/17 - last GFR in November > 60  Assessment/Plan: DM (diabetes mellitus), type 2 with renal complications - D1V just at goal at 7.5. Worsened since last two visits with 6.8 and 7.0, respectively. - Will switch from actos to invokana, given former's possible link to bladder cancer and the latter's mortality benefits.  - Ordered invokana 100 mg daily.  - Will need to monitor patient's GFR intermittently - Counseled to stay very well hydrated to reduce potential side effects of dizziness (also on ACEi) and bladder infections - Patient to begin going to the gym regularly  Left shoulder pain - Chronic. Limiting patient more recently. Suspect tendinopathy of rotator cuff--seems to have pain rather than actual weakness  to suggest tear. Also with significant arthritis history. - Will obtain x-ray of L shoulder to determine possible role of OA. - Will contact patient's pharmacy about PA for voltaren gel. - Suggested trying capsaicin cream in interim. - Recommended reducing reps of shoulder exercises if causing pain but to continue to encourage mobility - Could consider Sports Med referral for [place] of shoulder region  Follow-up in about 1 month to check kidney function.  Olene Floss,  MD [place], PGY-3

## 2017-07-26 NOTE — Telephone Encounter (Signed)
Called patient with x-ray results. It did show mild osteoarthritic changes at glenohumeral joint. Patient plans to try capsaicin cream, as voltaren gel will cost $84 per pharmacy.  Olene Floss, MD [place], PGY-3

## 2017-07-29 ENCOUNTER — Encounter: Payer: Self-pay | Admitting: Internal Medicine

## 2017-08-01 ENCOUNTER — Telehealth: Payer: Self-pay

## 2017-08-01 NOTE — Telephone Encounter (Signed)
Patient left message on nurse line that new DM med prescribed, Invokana, will cost him $125 which he states he is unable to afford on his social security payments. Routed to PCP. Danley Danker, RN [facility] RN)

## 2017-08-02 NOTE — Telephone Encounter (Addendum)
Covering inbox for Dr. Ola Spurr  Advised patient that we would look into alternative treatments that are more affordable for him. Will ask Dr. Valentina Lucks if he has any thoughts. Patient was appreciative of the call. He notes he is following up with Dr. Ola Spurr in 1 month.  Smitty Cords, MD [place], PGY-3

## 2017-08-02 NOTE — Telephone Encounter (Signed)
Pt called to check status. Fleeger, Jessica Dawn, CMA  

## 2017-08-06 NOTE — Telephone Encounter (Signed)
Patient left another message on nurse line asking about an alternative to Invokana. Routed to PCP. Danley Danker, RN [facility] RN)

## 2017-08-06 NOTE — Telephone Encounter (Signed)
Discussed pricing with patient. He says he will start invokana.

## 2017-08-07 ENCOUNTER — Other Ambulatory Visit: Payer: Self-pay | Admitting: *Deleted

## 2017-08-07 MED ORDER — DICLOFENAC SODIUM 1 % TD GEL: 4.0000 g | Freq: Four times a day (QID) | TRANSDERMAL | 2 refills | Status: DC

## 2017-08-19 ENCOUNTER — Other Ambulatory Visit: Payer: Self-pay | Admitting: *Deleted

## 2017-08-19 ENCOUNTER — Ambulatory Visit (INDEPENDENT_AMBULATORY_CARE_PROVIDER_SITE_OTHER): Payer: Medicare HMO | Admitting: Orthopaedic Surgery

## 2017-08-19 ENCOUNTER — Encounter (INDEPENDENT_AMBULATORY_CARE_PROVIDER_SITE_OTHER): Payer: Self-pay | Admitting: Orthopaedic Surgery

## 2017-08-19 DIAGNOSIS — Z96652 Presence of left artificial knee joint: Secondary | ICD-10-CM | POA: Diagnosis not present

## 2017-08-19 MED ORDER — DICLOFENAC SODIUM 1 % TD GEL: 4.0000 g | Freq: Four times a day (QID) | TRANSDERMAL | 2 refills | Status: DC

## 2017-08-19 NOTE — Progress Notes (Signed)
Office Visit Note   Patient: Andrew [name]           Date of Birth: 1955-07-20           MRN: [id number] Visit Date: 08/19/2017              Requested by: Rogue Bussing, MD [address] [place], [place] [address] PCP: Rogue Bussing, MD   Assessment & Plan: Visit Diagnoses:  1. Status post total left knee replacement     Plan:  Patient now 3 months status post left total knee arthroplasty. Wound is healed with no signs of complications or infection.  The patient does not complain of pain, and is back to normal daily activities.  It was reinforced that prophylactic antibiotics should be taken with any procedure including but not limited to dental work or colonoscopies.  We will plan on following up at the 6 month postop visit with 3 view xrays of left knee at that time. As always, instructions were given to call with any questions or concerns in the interim.   Follow-Up Instructions: Return in about 3 months (around 11/16/2017).   Orders:  No orders of the defined types were placed in this encounter.  No orders of the defined types were placed in this encounter.     Procedures: No procedures performed   Clinical Data: No additional findings.   Subjective: Chief Complaint  Patient presents with  . Left Knee - Routine Post Op    Patient is 3 months.  Doing well.  No real complaints.    Review of Systems   Objective: Vital Signs: There were no vitals taken for this visit.  Physical Exam  Ortho Exam Scar well healed.  Excellent ROM. No swelling. Specialty Comments:  No specialty comments available.  Imaging: No results found.   PMFS History: Patient Active Problem List   Diagnosis Date Noted  . Left shoulder pain 06/30/2017  . Total knee replacement status 05/15/2017  . Chronic gout of multiple sites 05/02/2017  . Left foot pain 10/22/2016  . Contracture of toe, left 10/22/2016  . Healthcare maintenance 04/04/2016  .  Osteoarthritis of both knees 12/26/2015  . Foot lesion 03/24/2015  . Bilateral foot pain 10/26/2014  . Impairment of balance 09/02/2014  . History of stroke 09/02/2014  . De Quervain's tenosynovitis, right 11/05/2013  . Pinguecula of both eyes 09/23/2013  . Chronic hyponatremia 09/23/2013  . Hypothyroidism 05/25/2013  . Memory loss 05/21/2013  . Personal history of colonic adenomas 04/01/2013  . Joint pain 05/06/2011  . OBESITY, UNSPECIFIED 05/20/2009  . Knee pain, bilateral 04/28/2009  . LUMBAGO 03/25/2009  . PSEUDOFOLLICULITIS BARBAE 40/34/7425  . SARCOIDOSIS 02/20/2007  . Hyperlipidemia associated with type 2 diabetes mellitus ([place]) 02/20/2007  . DM (diabetes mellitus), type 2 with renal complications ([place]) 95/63/8756  . ERECTILE DYSFUNCTION 10/18/2006  . 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 Essential hypertension 10/18/2006  . CKD (chronic kidney disease) stage 3, GFR 30-59 ml/min ([place]) 09/05/2006   Past Medical History:  Diagnosis Date  . Arthritis   . CRD (chronic renal disease), stage II    Baseline Cr 1.2  . CVA (cerebral vascular accident) ([place])    x4 last one in 2007, R sided residual weakness  . HAMMER TOE 07/25/2010  . HLD (hyperlipidemia)   . HTN (hypertension)   . Hypothyroidism   . Leg swelling   . Lipoma of back 03/18/2011  . Personal history of colonic adenomas 04/01/2013  .  Prostatitis    hx of x2  . PSEUDOFOLLICULITIS BARBAE 08/11/4823   Qualifier: Diagnosis of  By: Danise Mina  MD, Garlon Hatchet    . Sarcoidosis   . T2DM (type 2 diabetes mellitus) ([place])     Family History  Problem Relation Age of Onset  . Bone cancer Father   . Cancer Father 37       bone  . Uterine cancer Mother   . Cancer Mother   . Heart attack Brother   . Kidney disease Brother   . Diabetes Brother   . Arthritis Brother   . Heart disease Sister   . Congestive Heart Failure Sister   . Congestive Heart Failure Sister   . Congestive Heart Failure Sister   .  Diabetes Sister   . Arthritis Sister   . Colon cancer Neg Hx   . Rectal cancer Neg Hx   . Stomach cancer Neg Hx     Past Surgical History:  Procedure Laterality Date  . COLONOSCOPY W/ POLYPECTOMY    . ERCP  2006   Normal  . [place]   x2 'inguinal  . LIPOMA EXCISION  06/22/2011   Procedure: EXCISION LIPOMA;  Surgeon: Rolm Bookbinder, MD;  Location: [facility];  Service: General;  Laterality: N/A;  . TOTAL KNEE ARTHROPLASTY Left 05/15/2017   Procedure: LEFT TOTAL KNEE ARTHROPLASTY;  Surgeon: Leandrew Koyanagi, MD;  Location: [facility];  Service: Orthopedics;  Laterality: Left;  . TRANSTHORACIC ECHOCARDIOGRAM  2005   EF 60%   Social History   Occupational History  . Not on file  Tobacco Use  . Smoking status: Never Smoker  . Smokeless tobacco: Never Used  Substance and Sexual Activity  . Alcohol use: No    Alcohol/week: 0.6 oz    Types: 1 Glasses of wine per week  . Drug use: No  . Sexual activity: Not Currently

## 2017-08-23 DIAGNOSIS — Z961 Presence of intraocular lens: Secondary | ICD-10-CM | POA: Diagnosis not present

## 2017-09-19 ENCOUNTER — Ambulatory Visit (INDEPENDENT_AMBULATORY_CARE_PROVIDER_SITE_OTHER): Payer: Medicare HMO | Admitting: Internal Medicine

## 2017-09-19 ENCOUNTER — Encounter: Payer: Self-pay | Admitting: Internal Medicine

## 2017-09-19 ENCOUNTER — Other Ambulatory Visit: Payer: Self-pay

## 2017-09-19 VITALS — BP 130/72 | HR 69 | Temp 98.9°F | Ht 75.0 in | Wt 215.8 lb

## 2017-09-19 DIAGNOSIS — M109 Gout, unspecified: Secondary | ICD-10-CM

## 2017-09-19 DIAGNOSIS — Z79899 Other long term (current) drug therapy: Secondary | ICD-10-CM | POA: Diagnosis not present

## 2017-09-19 DIAGNOSIS — N189 Chronic kidney disease, unspecified: Secondary | ICD-10-CM

## 2017-09-19 DIAGNOSIS — M25512 Pain in left shoulder: Secondary | ICD-10-CM

## 2017-09-19 DIAGNOSIS — N179 Acute kidney failure, unspecified: Secondary | ICD-10-CM | POA: Diagnosis not present

## 2017-09-19 DIAGNOSIS — G8929 Other chronic pain: Secondary | ICD-10-CM

## 2017-09-19 MED ORDER — PREDNISONE 20 MG PO TABS: 40.0000 mg | ORAL_TABLET | Freq: Every day | ORAL | 0 refills | Status: DC

## 2017-09-19 NOTE — Patient Instructions (Signed)
Mr. Lesinski,  Please take prednisone 40 mg (2 tablets) daily for 5 days. This should help with pain caused by gout inflammation.  If shoulder if still acting up, please see me back in about a month to consider steroid injection.  Best, Dr. Ola Spurr

## 2017-09-19 NOTE — Progress Notes (Signed)
[facility] Progress Note  Subjective:  Andrew [name] is a 62 y.o. male with history of type 2 diabetes, osteoarthritis, and CKD who presents for medication follow-up and concern for gout flare.  He also complains of continued left shoulder pain.  #Medication follow-up: -Patient started on Invokana 100 mg last visit. -Denies dizziness or dysuria. -Does report increased urinary frequency. -Has noted improvement in his blood sugars.  Says sugars are usually in the 170s/low 200s but now more typically around 120 or less.  He denies sugars below 90.  #Toe pain: -Left toe has been hurting since this weekend.  Patient has noticed redness and soreness especially with putting on his shoes or with any pressure applied.  He has had difficulty sleeping due to toe pain. -Has also had some right wrist pain. -Denies injuries to these areas. -Wondering if he could have shot to improve his symptoms. -Takes 200 mg of allopurinol daily but concerned it is not controlling his gout well enough. ROS: No fevers.  #Left shoulder pain: -Ongoing for last 2-3 months. X-rays obtained after last visit.  These showed mild osteoarthritic changes at the glenohumeral joint. -Continues to have pain with raising shoulder above 90 degrees.  Denies weakness.   -Has been doing range of motion exercises as demonstrated at last visit.  Has been trying Tylenol without improvement.  Aspercreme with lidocaine helps some. ROS: No numbness or tingling down arms.  Allergies  Allergen Reactions  . Metoprolol Succinate     REACTION: bradycardia (HR to 42)  . Penicillins Itching    Has patient had a PCN reaction causing immediate rash, facial/tongue/throat swelling, SOB or lightheadedness with hypotension: No Has patient had a PCN reaction causing severe rash involving mucus membranes or skin necrosis: No Has patient had a PCN reaction that required hospitalization No Has patient had a PCN reaction occurring  within the last 10 years: No If all of the above answers are "NO", then may proceed with Cephalosporin use.    Social History   Tobacco Use  . Smoking status: Never Smoker  . Smokeless tobacco: Never Used  Substance Use Topics  . Alcohol use: No    Alcohol/week: 0.6 oz    Types: 1 Glasses of wine per week    Objective: Height 6\' 3"  (1.905 m), weight 215 lb 12.8 oz (97.9 kg). Body mass index is 26.97 kg/m. Constitutional: Pleasant male in no apparent distress. Musculoskeletal: Can raise left arm above 90 degrees and can nearly touch opposite shoulder blade but reports discomfort.  Positive painful arc.  TTP over first MTP joint of left great toe.  Discomfort with plantar flexion of big toe. Neurological: AOx3, no focal deficits. Skin: Mild erythema over left first MTP joint. Psychiatric: Normal mood and affect.  Vitals reviewed  Assessment/Plan: Acute gout involving toe of left foot - Location of pain typical for gout flare. Will treat with steroids rather than NSAIDs, given patient's CKD. No fevers or effusion to suggest infection.  - Ordered prednisone 40 mg x 5 days. Instructed patient to discontinue if sugars staying in mid-200s. - Ordered uric acid level (though may be deceivingly low in setting of gout attack) - Reviewed invokana side effects and found that it is actually associated with lowering uric acid level  Medication management - Invokana providing improved glycemic control. - Will check kidney function to make sure no AKI, as this is new medication.  Chronic left shoulder pain - Exam consistent with rotator cuff tendonitis. Would consider  cortisone injection but already treating today with prednisone for gout. Hopefully with have some relief from PO steroid.   Follow-up if no improvement. Return in about 1 month if left shoulder still hurting and would like steroid injection.   Olene Floss, MD [place], PGY-3

## 2017-09-20 ENCOUNTER — Encounter: Payer: Self-pay | Admitting: Internal Medicine

## 2017-09-20 DIAGNOSIS — G8929 Other chronic pain: Secondary | ICD-10-CM

## 2017-09-20 DIAGNOSIS — M25512 Pain in left shoulder: Secondary | ICD-10-CM

## 2017-09-20 DIAGNOSIS — M109 Gout, unspecified: Secondary | ICD-10-CM | POA: Insufficient documentation

## 2017-09-20 DIAGNOSIS — Z79899 Other long term (current) drug therapy: Secondary | ICD-10-CM | POA: Insufficient documentation

## 2017-09-20 DIAGNOSIS — Z125 Encounter for screening for malignant neoplasm of prostate: Secondary | ICD-10-CM | POA: Insufficient documentation

## 2017-09-20 HISTORY — DX: Pain in left shoulder: M25.512

## 2017-09-20 HISTORY — DX: Other chronic pain: G89.29

## 2017-09-20 LAB — BASIC METABOLIC PANEL
BUN/Creatinine Ratio: 10 (ref 10–24)
BUN: 18 mg/dL (ref 8–27)
CALCIUM: 9.5 mg/dL (ref 8.6–10.2)
CO2: 22 mmol/L (ref 20–29)
Chloride: 100 mmol/L (ref 96–106)
Creatinine, Ser: 1.73 mg/dL — ABNORMAL HIGH (ref 0.76–1.27)
GFR calc non Af Amer: 42 mL/min/{1.73_m2} — ABNORMAL LOW (ref >59)
GFR, EST AFRICAN AMERICAN: 48 mL/min/{1.73_m2} — AB (ref >59)
Glucose: 116 mg/dL — ABNORMAL HIGH (ref 65–99)
Potassium: 5 mmol/L (ref 3.5–5.2)
Sodium: 138 mmol/L (ref 134–144)

## 2017-09-20 LAB — URIC ACID: Uric Acid: 3.4 mg/dL — ABNORMAL LOW (ref 3.7–8.6)

## 2017-09-20 NOTE — Assessment & Plan Note (Signed)
-   Invokana providing improved glycemic control. - Will check kidney function to make sure no AKI, as this is new medication.

## 2017-09-20 NOTE — Assessment & Plan Note (Addendum)
-   Location of pain typical for gout flare. Will treat with steroids rather than NSAIDs, given patient's CKD. No fevers or effusion to suggest infection.  - Ordered prednisone 40 mg x 5 days. Instructed patient to discontinue if sugars staying in mid-200s. - Ordered uric acid level (though may be deceivingly low in setting of gout attack) - Reviewed invokana side effects and found that it is actually associated with lowering uric acid level

## 2017-09-20 NOTE — Assessment & Plan Note (Signed)
-   Exam consistent with rotator cuff tendonitis. Would consider cortisone injection but already treating today with prednisone for gout. Hopefully with have some relief from PO steroid.

## 2017-09-30 ENCOUNTER — Other Ambulatory Visit: Payer: Self-pay | Admitting: Internal Medicine

## 2017-10-22 ENCOUNTER — Other Ambulatory Visit: Payer: Self-pay | Admitting: Internal Medicine

## 2017-10-22 ENCOUNTER — Telehealth: Payer: Self-pay

## 2017-10-22 DIAGNOSIS — E1122 Type 2 diabetes mellitus with diabetic chronic kidney disease: Secondary | ICD-10-CM

## 2017-10-22 DIAGNOSIS — N179 Acute kidney failure, unspecified: Secondary | ICD-10-CM

## 2017-10-22 NOTE — Telephone Encounter (Signed)
Patient left message asking for Invokana refill (already sent to you) and lab results from last visit. It appears you had left him a voicemail about this but he never received. Call back is [phone].  Danley Danker, RN [facility] RN)

## 2017-10-22 NOTE — Telephone Encounter (Signed)
Patient did not get voicemail about his elevated creatinine. He says he got a new phone. He plans to come for lab appointment tomorrow, 4/17, for BMP. I will call to discuss results and plans for diabetes medication change.  Olene Floss, MD [place], PGY-3

## 2017-10-23 ENCOUNTER — Other Ambulatory Visit: Payer: Medicare HMO

## 2017-10-23 DIAGNOSIS — N179 Acute kidney failure, unspecified: Secondary | ICD-10-CM

## 2017-10-24 LAB — BASIC METABOLIC PANEL
BUN/Creatinine Ratio: 13 (ref 10–24)
BUN: 20 mg/dL (ref 8–27)
CO2: 21 mmol/L (ref 20–29)
CREATININE: 1.52 mg/dL — AB (ref 0.76–1.27)
Calcium: 9.3 mg/dL (ref 8.6–10.2)
Chloride: 99 mmol/L (ref 96–106)
GFR calc Af Amer: 56 mL/min/{1.73_m2} — ABNORMAL LOW (ref >59)
GFR calc non Af Amer: 49 mL/min/{1.73_m2} — ABNORMAL LOW (ref >59)
Glucose: 154 mg/dL — ABNORMAL HIGH (ref 65–99)
POTASSIUM: 4.9 mmol/L (ref 3.5–5.2)
SODIUM: 135 mmol/L (ref 134–144)

## 2017-10-24 MED ORDER — SEMAGLUTIDE(0.25 OR 0.5MG/DOS) 2 MG/1.5ML ~~LOC~~ SOPN: 0.2500 mg | PEN_INJECTOR | SUBCUTANEOUS | 0 refills | Status: DC

## 2017-10-24 NOTE — Telephone Encounter (Signed)
Discussed improved kidney function on recent BMP despite continued use of invokana. Suspect increase in SCr may have been due to active gout flare during last visit. Discussed treatment options for diabetes including continuing invokana. Patient would prefer to stop invokana because of increased urination and does not feel like his sugars have improved. He agrees to start ozempic weekly injections depending on cost through insurance. If works well for him, he would like refills sent to mail order pharmacy but [facility]   Olene Floss, MD [place], PGY-3

## 2017-10-30 ENCOUNTER — Ambulatory Visit (INDEPENDENT_AMBULATORY_CARE_PROVIDER_SITE_OTHER): Payer: Medicare HMO | Admitting: Family Medicine

## 2017-10-30 ENCOUNTER — Other Ambulatory Visit: Payer: Self-pay

## 2017-10-30 ENCOUNTER — Encounter: Payer: Self-pay | Admitting: Family Medicine

## 2017-10-30 VITALS — BP 112/68 | HR 70 | Temp 98.2°F | Ht 75.0 in | Wt 213.0 lb

## 2017-10-30 DIAGNOSIS — L309 Dermatitis, unspecified: Secondary | ICD-10-CM | POA: Insufficient documentation

## 2017-10-30 DIAGNOSIS — L2084 Intrinsic (allergic) eczema: Secondary | ICD-10-CM | POA: Diagnosis not present

## 2017-10-30 MED ORDER — TRIAMCINOLONE ACETONIDE 0.025 % EX OINT: 1.0000 "application " | TOPICAL_OINTMENT | Freq: Two times a day (BID) | CUTANEOUS | 0 refills | Status: DC

## 2017-10-30 NOTE — Assessment & Plan Note (Addendum)
Chronic.  Consistent with eczema.  Improved with triamcinolone use. - Refill triamcinolone ointment 0.025% 3 times daily as needed

## 2017-10-30 NOTE — Progress Notes (Signed)
   Subjective   Patient ID: Andrew [name]    DOB: [birth year], 61 y.o. male   MRN: [id number]  CC: "Rash follow-up"  HPI: Andrew [name] is a 62 y.o. male who presents to clinic today for the following:  Eczema: Patient with rash on left chest wall.  Onset 2 weeks ago.  Patient endorses a recurrence of this rash yearly which is worse in the winter.  Rash is nonpainful and pruritic.  He has taken triamcinolone cream 3 times daily with significant improvement of rash.  He is here today for follow-up.  Patient needs a refill of his prescription.  He denies similar rash in other parts of his body.  He denies fevers, wheezing or shortness of breath.  ROS: see HPI for pertinent.  Pace: Sarcoidosis, hypothyroidism, h/o stroke, HTN, ED, DM with CKD stage III.  Surgical history left total knee, lipoma excision, hernia, ERCP (2006).  History uterine cancer (mother), bone cancer (father), heart failure.  Smoking status reviewed. Medications reviewed.  Objective   BP 112/68   Pulse 70   Temp 98.2 F (36.8 C) (Oral)   Ht 6\' 3"  (1.905 m)   Wt 213 lb (96.6 kg)   SpO2 97%   BMI 26.62 kg/m  Vitals and nursing note reviewed.  General: well nourished, well developed, NAD with non-toxic appearance HEENT: normocephalic, atraumatic, moist mucous membranes Cardiovascular: regular rate and rhythm without murmurs, rubs, or gallops Lungs: clear to auscultation bilaterally with normal work of breathing Skin: warm, dry, cap refill < 2 seconds, blanchable erythematous macular rash on left chest wall Extremities: warm and well perfused, normal tone, no edema      Assessment & Plan   Eczema Chronic.  Consistent with eczema.  Improved with triamcinolone use. - Refill triamcinolone ointment 0.025% 3 times daily as needed  No orders of the defined types were placed in this encounter.  Meds ordered this encounter  Medications  . triamcinolone (KENALOG) 0.025 % ointment    Sig: Apply 1  application topically 2 (two) times daily.    Dispense:  30 g    Refill:  0    Harriet Butte, [place], PGY-2 10/30/2017, 3:27 PM

## 2017-10-30 NOTE — Patient Instructions (Signed)
Thank you for coming in to see [place] today. Please see below to review our plan for today's visit.  I sent in a refill of your triamcinolone ointment.  Apply this to the affected area as needed.  Please call the clinic at [phone] if your symptoms worsen or you have any concerns. It was our pleasure to serve you.  Harriet Butte, [place], PGY-2

## 2017-11-12 ENCOUNTER — Ambulatory Visit (INDEPENDENT_AMBULATORY_CARE_PROVIDER_SITE_OTHER): Payer: Medicare HMO | Admitting: Internal Medicine

## 2017-11-12 VITALS — BP 117/70 | HR 71 | Temp 98.6°F | Wt 212.2 lb

## 2017-11-12 DIAGNOSIS — E1110 Type 2 diabetes mellitus with ketoacidosis without coma: Secondary | ICD-10-CM

## 2017-11-12 DIAGNOSIS — I10 Essential (primary) hypertension: Secondary | ICD-10-CM

## 2017-11-12 DIAGNOSIS — R5383 Other fatigue: Secondary | ICD-10-CM

## 2017-11-12 DIAGNOSIS — E1122 Type 2 diabetes mellitus with diabetic chronic kidney disease: Secondary | ICD-10-CM

## 2017-11-12 LAB — POCT GLYCOSYLATED HEMOGLOBIN (HGB A1C): Hemoglobin A1C: 7.6

## 2017-11-12 MED ORDER — SEMAGLUTIDE(0.25 OR 0.5MG/DOS) 2 MG/1.5ML ~~LOC~~ SOPN: 0.5000 mg | PEN_INJECTOR | SUBCUTANEOUS | 2 refills | Status: DC

## 2017-11-12 MED ORDER — AMLODIPINE BESYLATE 5 MG PO TABS: 5.0000 mg | ORAL_TABLET | Freq: Every day | ORAL | 1 refills | Status: DC

## 2017-11-12 NOTE — Progress Notes (Signed)
[facility] Progress Note  Subjective:  Andrew [name] is a 62 y.o. male who has type 2 diabetes, CVA, osteoarthritis, gout, and CKD who presents for diabetes follow-up. His glucometer shows 30-day CBG average of 145. He has had somewhat increased fatigue, but he attributes this to ongoing recovery after knee replacement. He notes 1 CBG of 70. He says he has been eating more vegetables and avoiding red meat over the last 6 months. He has noticed weight loss since starting ozempic. He wants to do more physical activity and plans to start doing weight lifting again. He had tried sulfonylurea but had hypoglycemia, cannot take metformin due to kidney function, did not want to continue actos given bladder cancer link, and had slight AKI after trying invokana and did not like increased urination he experienced. He is on last week of 0.25 mg dose of ozempic before starting 0.5 mg dose this upcoming Sunday. ROS: No dizziness, no abdominal discomfort  Regarding HTN, patient reports leg swelling x 2 over the last week. This has occurred after activity. He is taking amlodipine 10 mg and enalapril 40 mg.   Allergies  Allergen Reactions  . Metoprolol Succinate     REACTION: bradycardia (HR to 42)  . Penicillins Itching    Has patient had a PCN reaction causing immediate rash, facial/tongue/throat swelling, SOB or lightheadedness with hypotension: No Has patient had a PCN reaction causing severe rash involving mucus membranes or skin necrosis: No Has patient had a PCN reaction that required hospitalization No Has patient had a PCN reaction occurring within the last 10 years: No If all of the above answers are "NO", then may proceed with Cephalosporin use.    Social History   Tobacco Use  . Smoking status: Never Smoker  . Smokeless tobacco: Never Used  Substance Use Topics  . Alcohol use: No    Alcohol/week: 0.6 oz    Types: 1 Glasses of wine per week    Objective: Blood  pressure 117/70, pulse 71, temperature 98.6 F (37 C), weight 212 lb 3.2 oz (96.3 kg), SpO2 97 %. Body mass index is 26.52 kg/m. Constitutional: Well-appearing male, overweight, in NAD HENT: MMM Abdominal: Soft. +BS, NT Musculoskeletal: Trace edema around ankles Neurological: AOx3, no focal deficits. Skin: Skin is warm and dry. No rash noted.  Psychiatric: Normal mood and affect.  Vitals reviewed  Assessment/Plan: DM (diabetes mellitus), type 2 with renal complications - G0F 7.6. Was 7.5 three months ago. Patient desires strict control but has been limited by hypoglycemia in the past. Medications limited as stated above. Tolerating ozempic. - Increase ozempic to 0.5 mg weekly as planned. Patient to check CBGs fasting or if feeling unwell. He knows to eat a snack if CBG is low.  - Has seen Dr. Gershon Crane for annual diabetic eye exam.   Essential hypertension - Below goal of 140/90. Will decrease amlodipine to 5 mg daily given slight LE swelling and complaint of fatigue.  Fatigue - Will check TSH and vitamin D level.   Follow-up in 3-4 weeks to recheck BP and review CBG log.  Olene Floss, MD [place], PGY-3

## 2017-11-12 NOTE — Patient Instructions (Signed)
Andrew [name],  Please decrease norvasc (amlodipine) to 5 mg daily--you can cut the 10 mg tabs in half. This may help with leg swelling.  Please return in 3-4 weeks to review your blood sugars and blood pressure.  I will call with lab results.  Best, Dr. Ola Spurr

## 2017-11-13 LAB — TSH: TSH: 1.29 u[IU]/mL (ref 0.450–4.500)

## 2017-11-13 LAB — VITAMIN D 25 HYDROXY (VIT D DEFICIENCY, FRACTURES): Vit D, 25-Hydroxy: 17.3 ng/mL — ABNORMAL LOW (ref 30.0–100.0)

## 2017-11-15 ENCOUNTER — Encounter: Payer: Self-pay | Admitting: Internal Medicine

## 2017-11-15 ENCOUNTER — Other Ambulatory Visit: Payer: Self-pay | Admitting: Internal Medicine

## 2017-11-15 DIAGNOSIS — R5383 Other fatigue: Secondary | ICD-10-CM | POA: Insufficient documentation

## 2017-11-15 MED ORDER — VITAMIN D 1000 UNITS PO TABS: 1000.0000 [IU] | ORAL_TABLET | Freq: Every day | ORAL | 3 refills | Status: DC

## 2017-11-15 NOTE — Assessment & Plan Note (Signed)
-   Below goal of 140/90. Will decrease amlodipine to 5 mg daily given slight LE swelling and complaint of fatigue.

## 2017-11-15 NOTE — Assessment & Plan Note (Addendum)
-   A1c 7.6. Was 7.5 three months ago. Patient desires strict control but has been limited by hypoglycemia in the past. Medications limited as stated above. Tolerating ozempic. - Increase ozempic to 0.5 mg weekly as planned. Patient to check CBGs fasting or if feeling unwell. He knows to eat a snack if CBG is low.  - Has seen Dr. Gershon Crane for annual diabetic eye exam.

## 2017-11-15 NOTE — Assessment & Plan Note (Signed)
-   Will check TSH and vitamin D level.

## 2017-11-18 ENCOUNTER — Encounter (INDEPENDENT_AMBULATORY_CARE_PROVIDER_SITE_OTHER): Payer: Self-pay | Admitting: Orthopaedic Surgery

## 2017-11-18 ENCOUNTER — Ambulatory Visit (INDEPENDENT_AMBULATORY_CARE_PROVIDER_SITE_OTHER): Payer: Medicare HMO

## 2017-11-18 ENCOUNTER — Ambulatory Visit (INDEPENDENT_AMBULATORY_CARE_PROVIDER_SITE_OTHER): Payer: Medicare HMO | Admitting: Orthopaedic Surgery

## 2017-11-18 DIAGNOSIS — Z96652 Presence of left artificial knee joint: Secondary | ICD-10-CM

## 2017-11-18 NOTE — Progress Notes (Signed)
Office Visit Note   Patient: Andrew [name]           Date of Birth: [birth year]           MRN: [id number] Visit Date: 11/18/2017              Requested by: Rogue Bussing, MD [address] [place], [place] [address] PCP: Rogue Bussing, MD   Assessment & Plan: Visit Diagnoses:  1. Status post total left knee replacement     Plan: Patient is 6 months status post left total knee replacement and doing very well.  Patient has resume normal activities at this point and doing very well with them.  Follow-up in 6 months with 2 view x-rays of the left knee.  Follow-Up Instructions: Return in about 6 months (around 05/21/2018).   Orders:  Orders Placed This Encounter  Procedures  . XR KNEE 3 VIEW LEFT   No orders of the defined types were placed in this encounter.     Procedures: No procedures performed   Clinical Data: No additional findings.   Subjective: Chief Complaint  Patient presents with  . Left Knee - Pain    Andrew [name] is 6 months status post left total knee replacement.  He is doing very well.  He endorses some occasional start up stiffness or pain but otherwise is very happy with his recovery.   Review of Systems   Objective: Vital Signs: There were no vitals taken for this visit.  Physical Exam  Ortho Exam Left knee exam shows a fully healed surgical scar.  He has excellent range of motion. Specialty Comments:  No specialty comments available.  Imaging: Xr Knee 3 View Left  Result Date: 11/18/2017 Stable left total knee replacement without complication    PMFS History: Patient Active Problem List   Diagnosis Date Noted  . Fatigue 11/15/2017  . Eczema 10/30/2017  . Medication management 09/20/2017  . Acute gout involving toe of left foot 09/20/2017  . Chronic left shoulder pain 09/20/2017  . Total knee replacement status 05/15/2017  . Chronic gout of multiple sites 05/02/2017  . Contracture of toe, left 10/22/2016   . Healthcare maintenance 04/04/2016  . Osteoarthritis of both knees 12/26/2015  . Bilateral foot pain 10/26/2014  . History of stroke 09/02/2014  . De Quervain's tenosynovitis, right 11/05/2013  . Pinguecula of both eyes 09/23/2013  . Hypothyroidism 05/25/2013  . Personal history of colonic adenomas 04/01/2013  . Knee pain, bilateral 04/28/2009  . PSEUDOFOLLICULITIS BARBAE 95/03/3266  . SARCOIDOSIS 02/20/2007  . Hyperlipidemia associated with type 2 diabetes mellitus ([place]) 02/20/2007  . DM (diabetes mellitus), type 2 with renal complications ([place]) 12/45/8099  . ERECTILE DYSFUNCTION 10/18/2006  . 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 Essential hypertension 10/18/2006  . CKD (chronic kidney disease) stage 3, GFR 30-59 ml/min ([place]) 09/05/2006   Past Medical History:  Diagnosis Date  . Arthritis   . CRD (chronic renal disease), stage II    Baseline Cr 1.2  . CVA (cerebral vascular accident) ([place])    x4 last one in 2007, R sided residual weakness  . HAMMER TOE 07/25/2010  . HLD (hyperlipidemia)   . HTN (hypertension)   . Hypothyroidism   . Leg swelling   . Lipoma of back 03/18/2011  . Personal history of colonic adenomas 04/01/2013  . Prostatitis    hx of x2  . PSEUDOFOLLICULITIS BARBAE 02/09/3824   Qualifier: Diagnosis of  By: Danise Mina  MD, Garlon Hatchet    .  Sarcoidosis   . T2DM (type 2 diabetes mellitus) ([place])     Family History  Problem Relation Age of Onset  . Bone cancer Father   . Cancer Father 63       bone  . Uterine cancer Mother   . Cancer Mother   . Heart attack Brother   . Kidney disease Brother   . Diabetes Brother   . Arthritis Brother   . Heart disease Sister   . Congestive Heart Failure Sister   . Congestive Heart Failure Sister   . Congestive Heart Failure Sister   . Diabetes Sister   . Arthritis Sister   . Colon cancer Neg Hx   . Rectal cancer Neg Hx   . Stomach cancer Neg Hx     Past Surgical History:  Procedure Laterality Date  .  COLONOSCOPY W/ POLYPECTOMY    . ERCP  2006   Normal  . [place]   x2 'inguinal  . LIPOMA EXCISION  06/22/2011   Procedure: EXCISION LIPOMA;  Surgeon: Rolm Bookbinder, MD;  Location: [place];  Service: General;  Laterality: N/A;  . TOTAL KNEE ARTHROPLASTY Left 05/15/2017   Procedure: LEFT TOTAL KNEE ARTHROPLASTY;  Surgeon: Leandrew Koyanagi, MD;  Location: [place];  Service: Orthopedics;  Laterality: Left;  . TRANSTHORACIC ECHOCARDIOGRAM  2005   EF 60%   Social History   Occupational History  . Not on file  Tobacco Use  . Smoking status: Never Smoker  . Smokeless tobacco: Never Used  Substance and Sexual Activity  . Alcohol use: No    Alcohol/week: 0.6 oz    Types: 1 Glasses of wine per week  . Drug use: No  . Sexual activity: Not Currently

## 2017-12-05 ENCOUNTER — Other Ambulatory Visit: Payer: Self-pay | Admitting: Internal Medicine

## 2017-12-12 ENCOUNTER — Telehealth: Payer: Self-pay

## 2017-12-12 NOTE — Telephone Encounter (Signed)
Patient left message that he would like a new form filled out to renew his handicap placard. He would like to pick this up at his appt with PCP on 12/16/17.  Call back is [phone]  Danley Danker, RN [facility])

## 2017-12-16 ENCOUNTER — Ambulatory Visit (INDEPENDENT_AMBULATORY_CARE_PROVIDER_SITE_OTHER): Payer: Medicare HMO | Admitting: Internal Medicine

## 2017-12-16 ENCOUNTER — Encounter: Payer: Self-pay | Admitting: Internal Medicine

## 2017-12-16 ENCOUNTER — Other Ambulatory Visit: Payer: Self-pay

## 2017-12-16 VITALS — BP 118/72 | HR 65 | Temp 98.1°F | Wt 206.0 lb

## 2017-12-16 DIAGNOSIS — E1122 Type 2 diabetes mellitus with diabetic chronic kidney disease: Secondary | ICD-10-CM | POA: Diagnosis not present

## 2017-12-16 DIAGNOSIS — R5383 Other fatigue: Secondary | ICD-10-CM | POA: Diagnosis not present

## 2017-12-16 DIAGNOSIS — N183 Chronic kidney disease, stage 3 unspecified: Secondary | ICD-10-CM

## 2017-12-16 DIAGNOSIS — N189 Chronic kidney disease, unspecified: Secondary | ICD-10-CM

## 2017-12-16 DIAGNOSIS — M25551 Pain in right hip: Secondary | ICD-10-CM | POA: Diagnosis not present

## 2017-12-16 MED ORDER — TRAMADOL HCL 50 MG PO TABS: 50.0000 mg | ORAL_TABLET | Freq: Every evening | ORAL | 0 refills | Status: DC | PRN

## 2017-12-16 NOTE — Patient Instructions (Signed)
Mr. Kulzer,  Continue to take tylenol for arthritis as needed. Take tramadol nightly for severe pain. I will refer you to sports medicine for possible right hip injection for bursitis.  I will call you with lab results.  Best, Dr. Ola Spurr

## 2017-12-16 NOTE — Assessment & Plan Note (Signed)
-   Improved control on ozempic. Continue at 0.5 mg weekly. - Check A1c in about 2 months, as last A1c was just above goal at 7.6.

## 2017-12-16 NOTE — Assessment & Plan Note (Signed)
-   Will check CMP and CBC.

## 2017-12-16 NOTE — Progress Notes (Signed)
[facility] Progress Note  Subjective:  Andrew [name] is a 62 y.o. with history of T2DM, CVA, osteoarthritis s/p left TKA in 2018, gout, and CKD who presents for joint pains and diabetes follow-up.  #Joint pains: - Worst pain is in right hip. Rates it at a 5/10. Ongoing for over a week since rainy weather spell. No injury to leg. No falls. Thinks gait is normal but may be relying on left leg more.  - Also complains of left shoulder pain and left toe pain (has old break from injury) and possible gout flare a couple days ago that improved with allopurinol.  - Pain aside from hip pain improves some with tylenol for arthritis. He will not take NSAIDs due to CKD. Hip pain bothers him most at night or when he has to get up at night to go to the bathroom. Pain makes it difficult to sleep, though he is also weaning himself off trazodone.  - Has taken tramadol for joint pains in the past with some relief.  - Pain does radiate some to groin.  ROS: No fevers, no rashes  #T2DM: - Feels that ozempic 0.5 mg weekly is helping his blood sugar a lot - Denies feeling jittery or week - Lowest CBG that he recalls was 90, compared to 70s on previous agents tried (sulfonylurea) - Notes that his blood sugar doesn't spike as high when he eats carb-rich foods, though he has been watching his diet  #CKD: - Would like kidney function checked as he says there is a long wait to get in with his nephrologist - Reports some increased fatigue--likes to take a nap around lunchtime  Allergies  Allergen Reactions  . Metoprolol Succinate     REACTION: bradycardia (HR to 42)  . Penicillins Itching    Has patient had a PCN reaction causing immediate rash, facial/tongue/throat swelling, SOB or lightheadedness with hypotension: No Has patient had a PCN reaction causing severe rash involving mucus membranes or skin necrosis: No Has patient had a PCN reaction that required hospitalization No Has patient  had a PCN reaction occurring within the last 10 years: No If all of the above answers are "NO", then may proceed with Cephalosporin use.   Social: Does not drink  Objective: Blood pressure 118/72, pulse 65, temperature 98.1 F (36.7 C), temperature source Oral, weight 206 lb (93.4 kg), SpO2 98 %. Body mass index is 25.75 kg/m. Constitutional: Pleasant male in NAD Musculoskeletal: Some discomfort with internal/external rotation of right hip; no pain on left. TTP over right greater trochanter.  Neurological: AOx3, no focal deficits. LE strength symmetric. Negative straight leg raise.  Psychiatric: Normal mood and affect.  Vitals reviewed  Previous imaging reviewed. Right hip x-ray from 05/2013 showed maintained joint space.   Assessment/Plan: Right hip pain - Given reproducibility of pain and specific location over right greater trochanter, suspect trochanteric bursitis versus possible piriformis syndrome.  - Patient interested in possible hip injection. Will refer to Sports Medicine. Not very interested in physical therapy as just completed sessions for knee replacement. - Provided short-term prn prescription for tramadol with recommendation to take after trying tylenol first.   DM (diabetes mellitus), type 2 with renal complications - Improved control on ozempic. Continue at 0.5 mg weekly. - Check A1c in about 2 months, as last A1c was just above goal at 7.6.   CKD (chronic kidney disease) stage 3, GFR 30-59 ml/min - Will check CMP and CBC.   Follow-up in a couple  months for A1c check.  Olene Floss, MD [place], PGY-3

## 2017-12-16 NOTE — Telephone Encounter (Signed)
Form filled out by PCP and ready for pick up at [facility]. Attempted to contact pt to inform, no answer or VM option. If pt calls back, please inform form is up front waiting for him.

## 2017-12-16 NOTE — Assessment & Plan Note (Signed)
-   Given reproducibility of pain and specific location over right greater trochanter, suspect trochanteric bursitis versus possible piriformis syndrome.  - Patient interested in possible hip injection. Will refer to Sports Medicine. Not very interested in physical therapy as just completed sessions for knee replacement. - Provided short-term prn prescription for tramadol with recommendation to take after trying tylenol first.

## 2017-12-17 LAB — COMPREHENSIVE METABOLIC PANEL
ALK PHOS: 82 IU/L (ref 39–117)
ALT: 11 IU/L (ref 0–44)
AST: 16 IU/L (ref 0–40)
Albumin/Globulin Ratio: 1.4 (ref 1.2–2.2)
Albumin: 4.1 g/dL (ref 3.6–4.8)
BUN/Creatinine Ratio: 8 — ABNORMAL LOW (ref 10–24)
BUN: 11 mg/dL (ref 8–27)
Bilirubin Total: 0.3 mg/dL (ref 0.0–1.2)
CALCIUM: 9.2 mg/dL (ref 8.6–10.2)
CO2: 22 mmol/L (ref 20–29)
CREATININE: 1.37 mg/dL — AB (ref 0.76–1.27)
Chloride: 98 mmol/L (ref 96–106)
GFR calc Af Amer: 64 mL/min/{1.73_m2} (ref >59)
GFR, EST NON AFRICAN AMERICAN: 55 mL/min/{1.73_m2} — AB (ref >59)
GLUCOSE: 135 mg/dL — AB (ref 65–99)
Globulin, Total: 2.9 g/dL (ref 1.5–4.5)
Potassium: 4 mmol/L (ref 3.5–5.2)
Sodium: 133 mmol/L — ABNORMAL LOW (ref 134–144)
Total Protein: 7 g/dL (ref 6.0–8.5)

## 2017-12-17 LAB — CBC
HEMATOCRIT: 40.5 % (ref 37.5–51.0)
HEMOGLOBIN: 13.3 g/dL (ref 13.0–17.7)
MCH: 29.9 pg (ref 26.6–33.0)
MCHC: 32.8 g/dL (ref 31.5–35.7)
MCV: 91 fL (ref 79–97)
Platelets: 238 10*3/uL (ref 150–450)
RBC: 4.45 x10E6/uL (ref 4.14–5.80)
RDW: 14.1 % (ref 12.3–15.4)
WBC: 5.2 10*3/uL (ref 3.4–10.8)

## 2017-12-18 ENCOUNTER — Other Ambulatory Visit: Payer: Self-pay | Admitting: Internal Medicine

## 2017-12-18 DIAGNOSIS — E1122 Type 2 diabetes mellitus with diabetic chronic kidney disease: Secondary | ICD-10-CM

## 2017-12-18 MED ORDER — SEMAGLUTIDE(0.25 OR 0.5MG/DOS) 2 MG/1.5ML ~~LOC~~ SOPN: 0.5000 mg | PEN_INJECTOR | SUBCUTANEOUS | 5 refills | Status: DC

## 2017-12-24 ENCOUNTER — Telehealth: Payer: Self-pay

## 2017-12-24 DIAGNOSIS — E1122 Type 2 diabetes mellitus with diabetic chronic kidney disease: Secondary | ICD-10-CM

## 2017-12-24 MED ORDER — SEMAGLUTIDE(0.25 OR 0.5MG/DOS) 2 MG/1.5ML ~~LOC~~ SOPN: 0.5000 mg | PEN_INJECTOR | SUBCUTANEOUS | 5 refills | Status: DC

## 2017-12-24 NOTE — Telephone Encounter (Signed)
Order placed

## 2017-12-24 NOTE — Telephone Encounter (Signed)
Order placed.  Olene Floss, MD [place], PGY-3

## 2017-12-24 NOTE — Telephone Encounter (Signed)
[facility] [address] [place]. It is cheaper there than through [facility]. Patient cannot afford through [facility].  Danley Danker, RN [facility] RN)

## 2017-12-25 ENCOUNTER — Ambulatory Visit: Payer: Medicare HMO | Admitting: Family Medicine

## 2017-12-25 NOTE — Telephone Encounter (Signed)
Patient called again. Copay still too high. [facility] is in the $200 range. Can only afford around $50. Would like PCP to call him to discuss other options.  Call back is [phone]  Danley Danker, RN [facility] [place])

## 2017-12-26 ENCOUNTER — Telehealth: Payer: Self-pay | Admitting: Pharmacist

## 2017-12-26 NOTE — Telephone Encounter (Signed)
Called [employer], he is nearing the donut hole which is why is Ozempic is suddenly so expensive. Ozempic does not have a patient assistance program at present. Called patient and filled out LIS/Extra Help application over the phone. Patient to call when he gets letter from social security with application decision.   Carlean Jews, Pharm.D., BCPS PGY2 [facility] Resident Phone: [phone]

## 2017-12-26 NOTE — Telephone Encounter (Signed)
-----   Message from Leavy Cella, [facility] sent at 12/26/2017 10:54 AM EDT ----- Hi Dr. Valentina Lucks,  This patient who has Medicare says his copay for ozempic is around $200 and he can only afford about $50. Are you aware of any assistance programs? I looked through the manufacturer and looks like patients with any form of insurance aren't eligible. He has CKD and has had low sugars on sulfonylureas, so options are limited. He has decent control (last A1c 7.6) but has had stroke before so would like to keep him there.   Thank you! Hillary

## 2017-12-27 NOTE — Telephone Encounter (Signed)
Pharmacist Deirdre Pippins contacted patient about options.

## 2017-12-30 ENCOUNTER — Telehealth: Payer: Self-pay | Admitting: *Deleted

## 2017-12-30 ENCOUNTER — Telehealth: Payer: Self-pay | Admitting: Pharmacist

## 2017-12-30 NOTE — Patient Outreach (Signed)
[facility]) Care Management  12/30/2017  Andrew [name] [birth year] [phone]  Pt calls and left VM requesting samples of Ozempic as he is now out. Has not heard from [facility] yet regarding decision.   Carlean Jews, Pharm.D., BCPS PGY2 Ambulatory Care Pharmacy Resident Phone: [phone]

## 2017-12-30 NOTE — Telephone Encounter (Signed)
Pt calls nurse line.  He wants to know if we have samples of Ozempic.  Per Dr. Valentina Lucks, he should have today or tomorrow.  Pt informed and will await phone call. Lynae Pederson, Salome Spotted, CMA

## 2017-12-31 ENCOUNTER — Telehealth: Payer: Self-pay | Admitting: Pharmacist

## 2017-12-31 NOTE — Telephone Encounter (Signed)
Spoke to patient about receiving Ozempic (semaglutide) samples today, and that he can come by and pickup tomorrow, 01/01/2018. Patient's last dose of semaglutide 0.5mg  was on Saturday, 12/28/2017 and next dose is due 01/04/2018.

## 2018-01-07 ENCOUNTER — Other Ambulatory Visit: Payer: Self-pay | Admitting: Family Medicine

## 2018-01-07 ENCOUNTER — Telehealth: Payer: Self-pay

## 2018-01-07 MED ORDER — LIRAGLUTIDE 18 MG/3ML ~~LOC~~ SOPN: PEN_INJECTOR | SUBCUTANEOUS | 11 refills | Status: DC

## 2018-01-07 NOTE — Telephone Encounter (Signed)
Patient left message that he was denied assistance for cost of Ozempic. Please advise what he should do going forward. Change medication vs continue to get samples?  Call back is [phone]  Danley Danker, RN [facility] [place])

## 2018-01-07 NOTE — Telephone Encounter (Signed)
Have not met patient.  Ozempic likely not covered by insurance.  Took the liberty to switch to Victoza. Please inform patient.  Harriet Butte, [place], PGY-3

## 2018-01-08 ENCOUNTER — Encounter: Payer: Self-pay | Admitting: Family Medicine

## 2018-01-08 ENCOUNTER — Ambulatory Visit (INDEPENDENT_AMBULATORY_CARE_PROVIDER_SITE_OTHER): Payer: Medicare HMO | Admitting: Family Medicine

## 2018-01-08 DIAGNOSIS — M7061 Trochanteric bursitis, right hip: Secondary | ICD-10-CM

## 2018-01-08 MED ORDER — METHYLPREDNISOLONE ACETATE 80 MG/ML IJ SUSP
80.0000 mg | Freq: Once | INTRAMUSCULAR | Status: AC
  Administered 2018-01-08: 80 mg via INTRA_ARTICULAR

## 2018-01-08 NOTE — Assessment & Plan Note (Signed)
Patient is here with signs and symptoms consistent with greater trochanteric bursitis of the right hip. -Bursal injection performed today.  Patient tolerated well. -HEP: Focus on IT band stretching exercises -RICE PRN -Patient is to follow-up in 3 to 4 weeks PRN if symptoms persist.

## 2018-01-08 NOTE — Progress Notes (Signed)
   HPI  CC: Right hip pain Patient is here due to issues with right-sided hip pain over the past 2 to 3 months.  He states that the pain is located along the lateral aspect of his right hip.  He has some mild radiation down the lateral aspect of his leg towards his knee but never beyond.  He denies any trauma, injury, or event which may have caused this pain.  He denies any weakness, numbness, or paresthesias.  Pain is worse when he lies on the side at night.  He also has worsening pain with ambulation following prolonged immobility (i.e. getting up from sleeping).   Traumatic: No  Location: Lateral right hip Quality: Sharp aching, tight  Duration: 2-3 months  Timing: Nighttime, prolonged sitting Improving/Worsening: Unchanged Makes better: Ice Makes worse: Direct pressure Associated symptoms: None  Previous Interventions Tried: Ice, rest, time  Past Injuries: Noncontributory Past Surgeries: Left-sided knee replacement Smoking: Non-smoker Family Hx: Noncontributory  ROS: Per HPI; in addition no fever, no rash, no additional weakness, no additional numbness, no additional paresthesias, and no additional falls/injury.   Objective: BP 132/72   Ht 6\' 1"  (1.854 m)   Wt 206 lb (93.4 kg)   BMI 27.18 kg/m  Gen: NAD, well groomed, a/o x3, normal affect.  CV: Well-perfused. Warm.  Resp: Non-labored.  Neuro: Sensation intact throughout. No gross coordination deficits.  Gait: Nonpathologic posture, unremarkable stride without signs of limp or balance issues. Hip, Right: TTP noted at the posterior aspect of the greater trochanteric bursa. No obvious rash, erythema, ecchymosis, or edema. ROM full in all directions (IR: 80/ ER: 80/Flex: 120/Ext: 100/Abd: 45/Add: 45); Strength 5/5. Pelvic alignment unremarkable to inspection and palpation. Standing hip rotation and gait without trendelenburg / unsteadiness. No tenderness over piriformis. No SI joint tenderness and normal minimal SI  movement.  INJECTION: Right greater trochanteric bursa Patient was given informed consent, signed copy in the chart. Appropriate time out was taken. Area prepped and draped in usual sterile fashion. 1 cc of methylprednisolone 80 mg/ml plus  4 cc of Marcaine was injected into the right greater trochanteric bursa using a(n) lateral approach. The patient tolerated the procedure well. There were no complications. Post procedure instructions were given. -Injection performed by Dr. Jodell Cipro (Peds PGY3) with me actively observing and instructing.   Assessment and Plan:  Greater trochanteric bursitis of right hip Patient is here with signs and symptoms consistent with greater trochanteric bursitis of the right hip. -Bursal injection performed today.  Patient tolerated well. -HEP: Focus on IT band stretching exercises -RICE PRN -Patient is to follow-up in 3 to 4 weeks PRN if symptoms persist.   Meds ordered this encounter  Medications  . methylPREDNISolone acetate (DEPO-MEDROL) injection 80 mg     Elberta Leatherwood, MD,MS [place] Fellow 01/08/2018 6:33 PM

## 2018-01-09 ENCOUNTER — Other Ambulatory Visit (INDEPENDENT_AMBULATORY_CARE_PROVIDER_SITE_OTHER): Payer: Self-pay | Admitting: Orthopaedic Surgery

## 2018-01-10 NOTE — Telephone Encounter (Signed)
Please advise 

## 2018-01-10 NOTE — Telephone Encounter (Signed)
LMOVM informing pt. [name], CMA  

## 2018-01-10 NOTE — Telephone Encounter (Signed)
Pt is has a few questions about starting victoza:  1.  When should he start it since he has ozempic still left  2. What side effects should he expect  3.  Is it ok to take with his kidney disease?

## 2018-01-13 NOTE — Telephone Encounter (Signed)
The patient regarding transition from [place] to [place].  Questions answered.  Patient instructed to follow-up with me in 2 months.  Harriet Butte, [place], PGY-3

## 2018-01-14 ENCOUNTER — Telehealth: Payer: Self-pay | Admitting: *Deleted

## 2018-01-14 ENCOUNTER — Encounter: Payer: Self-pay | Admitting: Family Medicine

## 2018-01-14 NOTE — Telephone Encounter (Signed)
Contacted [employer] regarding waiver earlier today.  Provided information over phone.  I did receive the waiver form by fax, but the insurance company informed me we will need to wait for the pharmacy to review medication.  Harriet Butte, [place], PGY-3

## 2018-01-14 NOTE — Telephone Encounter (Signed)
Pt calls because he contacted his insurance and they were going to send a waiver to Dr. Yisroel Ramming to lower the cost of his Victoza.  The meds cost him $270 dollars.   Please let him know if we did not receive (nothing in providers box) and also let him know when we send the fax back. Topanga Alvelo, Salome Spotted, CMA

## 2018-01-24 ENCOUNTER — Other Ambulatory Visit: Payer: Self-pay

## 2018-01-24 ENCOUNTER — Encounter: Payer: Self-pay | Admitting: Family Medicine

## 2018-01-24 ENCOUNTER — Ambulatory Visit (INDEPENDENT_AMBULATORY_CARE_PROVIDER_SITE_OTHER): Payer: Medicare HMO | Admitting: Family Medicine

## 2018-01-24 ENCOUNTER — Ambulatory Visit (HOSPITAL_COMMUNITY)
Admission: RE | Admit: 2018-01-24 | Discharge: 2018-01-24 | Disposition: A | Discharge status: Home / Self Care | Payer: Medicare HMO | Source: Ambulatory Visit | Attending: Family Medicine | Admitting: Family Medicine

## 2018-01-24 VITALS — BP 124/60 | HR 64 | Temp 98.2°F | Ht 73.0 in | Wt 200.2 lb

## 2018-01-24 DIAGNOSIS — R42 Dizziness and giddiness: Secondary | ICD-10-CM | POA: Insufficient documentation

## 2018-01-24 NOTE — Assessment & Plan Note (Signed)
Acute.  Suspicious for Ozempic side effect.  EKG 12-lead NSR without heart block.  Neuro exam intact.  History not consistent with orthostatic hypotension. - Advised patient to discontinue Ozempic and begin Victoza - Reviewed return precautions

## 2018-01-24 NOTE — Progress Notes (Signed)
   Subjective   Patient ID: Andrew [name]    DOB: [birth year], 62 y.o. male   MRN: [id number]  CC: "Lightheaded"  HPI: Andrew [name] is a 62 y.o. male who presents to clinic today for the following:  DIZZINESS  Feeling dizzy intermittently for 6 days. Dizziness is lightheaded, not vertigo Feels like room spins: no Lightheadedness when stands: not sure Palpitations or heart racing: no Prior dizziness: no Medications tried: taking Ozepmic for last 4 weeks Taking blood thinners: no  Symptoms Hearing Loss: no Ear Pain or fullness: no Nausea or vomiting: no Vision difficulty or double vision: no Falls: no Head trauma: no Weakness in arm or leg: no Speaking problems: no Headache: yes on frontal region  Of note, patient recently started Ozempic for diabetes recently 1 month ago.  His lightheadedness began 1 week ago with periodic episodes.  He was mowing the grass during his initial episode which spontaneously resolved with rest but the most recent episode occurring while at rest in his home.  He believes this may be related to the medication and endorses frontal headache and some constipation which are known side effects.  He denies strokelike symptoms but does endorse history of stroke back in 1994 in 2007.  He currently is on aspirin daily but no other blood thinners.  ROS: see HPI for pertinent.  [place]: Sarcoidosis, hypothyroidism, h/o stroke, HTN, ED, DM with CKD stage III.  Surgical history left total knee, lipoma excision, hernia, ERCP (2006).  History uterine cancer (mother), bone cancer (father), heart failure.  Smoking status reviewed. Medications reviewed.  Objective   BP 124/60   Pulse 64   Temp 98.2 F (36.8 C) (Oral)   Ht 6\' 1"  (1.854 m)   Wt 200 lb 3.2 oz (90.8 kg)   SpO2 98%   BMI 26.41 kg/m  Vitals and nursing note reviewed.  General: well nourished, well developed, NAD with non-toxic appearance HEENT: normocephalic, atraumatic, moist mucous  membranes, PERRLA, EOMI Neck: supple, non-tender without lymphadenopathy Cardiovascular: regular rate and rhythm without murmurs, rubs, or gallops Lungs: clear to auscultation bilaterally with normal work of breathing Skin: warm, dry, no rashes or lesions, cap refill < 2 seconds Extremities: warm and well perfused, normal tone, no edema, upper and lower extremities 5/5 motor strength bilaterally including grip strength Neuro: CNII-XII intact, no dysarthria or facial droop, fine motor intact  Assessment & Plan   Episodic lightheadedness Acute.  Suspicious for Ozempic side effect.  EKG 12-lead NSR without heart block.  Neuro exam intact.  History not consistent with orthostatic hypotension. - Advised patient to discontinue Ozempic and begin Victoza - Reviewed return precautions  Orders Placed This Encounter  Procedures  . EKG 12-Lead   No orders of the defined types were placed in this encounter.   Harriet Butte, [place], PGY-3 01/24/2018, 12:32 PM

## 2018-01-24 NOTE — Patient Instructions (Signed)
Thank you for coming in to see [place] today. Please see below to review our plan for today's visit.  I believe your symptoms are related to the Ozempic.  Discontinue this medication and begin the Victoza beginning Saturday.  He will take the Victoza daily beginning with 0.6 mg daily followed by 1.2 mg daily after 1 week of use.  I will see you again at your next scheduled appointment in August.  If your symptoms of dizziness continue after a few weeks of discontinuing the Ozempic, please return to the clinic for further evaluation.  If at any point you have strokelike symptoms including change in speech, numbness or loss of motor function, facial droop, change in vision or hearing, call 911 or seek immediate medical attention.  Please call the clinic at [phone] if your symptoms worsen or you have any concerns. It was our pleasure to serve you.  Harriet Butte, [place], PGY-3

## 2018-01-27 ENCOUNTER — Telehealth: Payer: Self-pay

## 2018-01-27 ENCOUNTER — Other Ambulatory Visit: Payer: Self-pay | Admitting: Internal Medicine

## 2018-01-27 NOTE — Telephone Encounter (Signed)
Pt called nurse line stating he took his first does of Victoza on Saturday morning, 1.2 in am. Pt stated afterward he felt very tired and weak and had a headache the remainder of the day. Pt skipped his does on Sunday and began to start feeling like his normal self. Pt does not want to continue this medication or continue any injections, pill form only. Please advise.

## 2018-01-27 NOTE — Telephone Encounter (Signed)
Noted. Thank you.  Andrew [name], [place], PGY-3

## 2018-01-29 ENCOUNTER — Ambulatory Visit: Payer: Medicare HMO | Admitting: Family Medicine

## 2018-01-29 NOTE — Telephone Encounter (Signed)
Pt left VM on nurse line in regards to below. Pt is unsure if he is suppose to be on a different medication, since he has stopped taking insulin due to side effects. Please advise.

## 2018-01-29 NOTE — Telephone Encounter (Signed)
You don't have any appts in 1-2 weeks. Did you mean be seen by another doctor? Please advise. Rivka Baune Kennon Holter, CMA

## 2018-01-29 NOTE — Telephone Encounter (Signed)
Patient has appointment with me on 8/20. He can schedule an appointment in the next 1-2 weeks to discuss alternative options for his diabetes. Please advise. Thank you.  Harriet Butte, [place], PGY-3

## 2018-02-03 NOTE — Telephone Encounter (Signed)
If he wants an earlier appt, then yes. Otherwise he can f/u with me on 8/20. Thanks.  Harriet Butte, [place], PGY-3

## 2018-02-04 ENCOUNTER — Telehealth: Payer: Self-pay | Admitting: *Deleted

## 2018-02-04 NOTE — Telephone Encounter (Signed)
Pt did not receive any pen needles to inject his victoza.  Per pharmacy they cant provide unless they have script. Jenalyn Girdner, Salome Spotted, CMA

## 2018-02-05 ENCOUNTER — Other Ambulatory Visit: Payer: Self-pay | Admitting: Family Medicine

## 2018-02-05 NOTE — Progress Notes (Signed)
Entered in error

## 2018-02-05 NOTE — Telephone Encounter (Signed)
Pt instructed to d/c med given side effects. Please notify pharmacy. Thank you.  Harriet Butte, [place], PGY-3

## 2018-02-06 ENCOUNTER — Encounter: Payer: Self-pay | Admitting: Family Medicine

## 2018-02-06 ENCOUNTER — Other Ambulatory Visit: Payer: Self-pay

## 2018-02-06 ENCOUNTER — Ambulatory Visit (INDEPENDENT_AMBULATORY_CARE_PROVIDER_SITE_OTHER): Payer: Medicare HMO | Admitting: Family Medicine

## 2018-02-06 ENCOUNTER — Telehealth: Payer: Self-pay | Admitting: Family Medicine

## 2018-02-06 VITALS — BP 128/80 | HR 77 | Temp 98.2°F | Wt 200.0 lb

## 2018-02-06 DIAGNOSIS — E1122 Type 2 diabetes mellitus with diabetic chronic kidney disease: Secondary | ICD-10-CM | POA: Diagnosis not present

## 2018-02-06 DIAGNOSIS — N182 Chronic kidney disease, stage 2 (mild): Secondary | ICD-10-CM | POA: Diagnosis not present

## 2018-02-06 LAB — POCT GLYCOSYLATED HEMOGLOBIN (HGB A1C): HbA1c, POC (controlled diabetic range): 6.2 % (ref 0.0–7.0)

## 2018-02-06 MED ORDER — CLINDAMYCIN PHOS-BENZOYL PEROX 1-5 % EX GEL: 1.0000 "application " | Freq: Two times a day (BID) | CUTANEOUS | 1 refills | Status: DC

## 2018-02-06 MED ORDER — INSULIN PEN NEEDLE 29G X 12.7MM MISC: 3 refills | Status: DC

## 2018-02-06 MED ORDER — LIRAGLUTIDE 18 MG/3ML ~~LOC~~ SOPN: 1.2000 mg | PEN_INJECTOR | Freq: Every day | SUBCUTANEOUS | 11 refills | Status: DC

## 2018-02-06 NOTE — Telephone Encounter (Signed)
Patient was seen today by Dr. Shawna Orleans and all seems to have been resolved with Victoza and needles.   Ozella Almond, [place]

## 2018-02-06 NOTE — Progress Notes (Signed)
    Subjective:  Andrew [name] is a 62 y.o. male who presents to the [facility] today with a chief complaint of of medication side effect.   HPI:  Patient is a diabetic who has previously not been able to tolerate metformin due to CKD and invokana due to AKI. He also was not able to tolerate sulfonylurea due to hypoglycemia and did not want to take actos given bladder cancer link.  He is here today because he is not happy with his GLP-1 side effects and because he ran out of needles to administer his victoza. He states that he was previously on ozempic which he did not tolerate due to lightheadedness and was switched to victoza a few weeks ago. He states that he has had continued to have some lightheadedness with victoza, currently taking 1.2mg  daily. He is frustrated because he ran out of the needles to administer it and called for a refill of the needles but still has not received them. He denies CP, SOB, polyuria, polydipsia, LE swelling, vision changes  He would like a refill on benzaclin which he uses for breakouts around his mouth after he shaves. He was not tried on benzoyl peroxide gel prior to the benzaclin but he only uses as needed and it works well.  ROS: Per HPI   Objective:  Physical Exam: BP 128/80   Pulse 77   Temp 98.2 F (36.8 C) (Oral)   Wt 200 lb (90.7 kg)   SpO2 98%   BMI 26.39 kg/m   Gen: NAD, resting comfortably CV: RRR with no murmurs appreciated Pulm: NWOB, CTAB with no crackles, wheezes, or rhonchi GI: Normal bowel sounds present. Soft, Nontender, Nondistended. MSK: no edema, cyanosis, or clubbing noted Skin: warm, dry Neuro: grossly normal, moves all extremities Psych: Normal affect and thought content  Results for orders placed or performed in visit on 02/06/18 (from the past 72 hour(s))  HgB A1c     Status: None   Collection Time: 02/06/18  9:58 AM  Result Value Ref Range   Hemoglobin A1C  4.0 - 5.6 %   HbA1c POC (<> result, manual entry)  4.0 -  5.6 %   HbA1c, POC (prediabetic range)  5.7 - 6.4 %   HbA1c, POC (controlled diabetic range) 6.2 0.0 - 7.0 %     Assessment/Plan:  Controlled diabetes mellitus ([place]) Diabetes is now well controlled on victoza 1.2mg  qd. Long discussion with patient today given his multiple medication intolerances, offered to decrease/switch victoza given patient kidney function in June 2019 showed Cr 1.37 with GFR 64 which would be okay to retrial metformin. Patient declined and preferred to stay on current dose of Victoza to see if his body gets used to it and to follow up in 1 month. He would like his kidney function rechecked today so BMP ordered. Pen needles sent to pharmacy.    Bufford Lope, DO PGY-3, [facility] 02/06/2018 9:53 AM

## 2018-02-06 NOTE — Telephone Encounter (Signed)
Pt came in office requesting Rx refill on his Clindamycin Phosphate and Benzoyl Peroxide Gel. Best phone # to contact is [phone].

## 2018-02-06 NOTE — Assessment & Plan Note (Signed)
Diabetes is now well controlled on victoza 1.2mg  qd. Long discussion with patient today given his multiple medication intolerances, offered to decrease/switch victoza given patient kidney function in June 2019 showed Cr 1.37 with GFR 64 which would be okay to retrial metformin. Patient declined and preferred to stay on current dose of Victoza to see if his body gets used to it and to follow up in 1 month. He would like his kidney function rechecked today so BMP ordered. Pen needles sent to pharmacy.

## 2018-02-06 NOTE — Patient Instructions (Addendum)
I will call your pharmacy to investigate what needles you need. We have decided to keep the dose the same and see if your body get used to this medication.   The other option we talked about today was to decrease victoza and retrial low dose metformin with close kidney function monitoring.   We are checking some labs today. If results require attention, either myself or my nurse will get in touch with you. If everything is normal, you will get a letter in the mail or a message in My Chart. Please give [place] a call if you do not hear from [place] after 2 weeks.    Follow up in 1 month for diabetes

## 2018-02-07 ENCOUNTER — Other Ambulatory Visit: Payer: Self-pay | Admitting: Internal Medicine

## 2018-02-07 ENCOUNTER — Encounter: Payer: Self-pay | Admitting: Family Medicine

## 2018-02-07 LAB — BASIC METABOLIC PANEL
BUN / CREAT RATIO: 10 (ref 10–24)
BUN: 14 mg/dL (ref 8–27)
CO2: 22 mmol/L (ref 20–29)
Calcium: 9.6 mg/dL (ref 8.6–10.2)
Chloride: 101 mmol/L (ref 96–106)
Creatinine, Ser: 1.36 mg/dL — ABNORMAL HIGH (ref 0.76–1.27)
GFR calc Af Amer: 64 mL/min/{1.73_m2} (ref >59)
GFR calc non Af Amer: 55 mL/min/{1.73_m2} — ABNORMAL LOW (ref >59)
GLUCOSE: 115 mg/dL — AB (ref 65–99)
Potassium: 4.8 mmol/L (ref 3.5–5.2)
Sodium: 138 mmol/L (ref 134–144)

## 2018-02-07 NOTE — Telephone Encounter (Signed)
Prescription appears to be refilled by Dr. Shawna Orleans on 02/06/2018.  Harriet Butte, [place], PGY-3

## 2018-02-10 ENCOUNTER — Ambulatory Visit (INDEPENDENT_AMBULATORY_CARE_PROVIDER_SITE_OTHER): Payer: Medicare HMO

## 2018-02-10 ENCOUNTER — Encounter (INDEPENDENT_AMBULATORY_CARE_PROVIDER_SITE_OTHER): Payer: Self-pay | Admitting: Physician Assistant

## 2018-02-10 ENCOUNTER — Ambulatory Visit (INDEPENDENT_AMBULATORY_CARE_PROVIDER_SITE_OTHER): Payer: Medicare HMO | Admitting: Physician Assistant

## 2018-02-10 DIAGNOSIS — M1712 Unilateral primary osteoarthritis, left knee: Secondary | ICD-10-CM

## 2018-02-10 DIAGNOSIS — M545 Low back pain: Secondary | ICD-10-CM

## 2018-02-10 DIAGNOSIS — Z96652 Presence of left artificial knee joint: Secondary | ICD-10-CM | POA: Diagnosis not present

## 2018-02-10 MED ORDER — ACETAMINOPHEN-CODEINE #3 300-30 MG PO TABS: 1.0000 | ORAL_TABLET | Freq: Three times a day (TID) | ORAL | 0 refills | Status: DC | PRN

## 2018-02-10 NOTE — Progress Notes (Signed)
Office Visit Note  Patient is a pleasant 62 year old gentleman who presents our clinic today 9 months status post left total knee replacement, date of surgery 05/15/2017. Patient: Andrew [name]           Date of Birth: [birth year]           MRN: [id number] Visit Date: 02/10/2018              Requested by: Bufford Lope, DO [address] [place], [place] [address] PCP: Bufford Lope, DO   Assessment & Plan: Visit Diagnoses:  1. Status post total left knee replacement   2. Unilateral primary osteoarthritis, left knee   3. Acute low back pain, unspecified back pain laterality, with sciatica presence unspecified     Plan: Impression is left-sided lumbar radiculopathy following left total knee replacement.  The patient has discussed with me that he is unable to take any nephrotoxic medications secondary to kidney failure.  We are unable to start him on a Sterapred taper for this reason.  I did give him a prescription for Tylenol 3.  We discussed formal physical therapy, but he states he still has exercises from when he previously attended for his back.  He will start these at home.  Follow-up with [place] in 3 months time for recheck of his left total knee replacement.  Call if concerns or questions in the meantime.  Follow-Up Instructions: Return in about 3 months (around 05/13/2018).   Orders:  Orders Placed This Encounter  Procedures  . XR Knee 1-2 Views Left  . XR Lumbar Spine 2-3 Views   Meds ordered this encounter  Medications  . acetaminophen-codeine (TYLENOL #3) 300-30 MG tablet    Sig: Take 1 tablet by mouth 3 (three) times daily as needed for moderate pain.    Dispense:  30 tablet    Refill:  0      Procedures: No procedures performed   Clinical Data: No additional findings.   Subjective: Chief Complaint  Patient presents with  . Left Knee - Pain    HPI he was doing well until a few weeks back when he guarded to walk around the block for a week's time for  exercise.  Since then he has had pain to the posterior aspect of the left knee occasionally radiating down the front of the knee as well.  Pain is worse when walking and standing.  No pain at rest.  No numbness, tingling or burning.  No fevers, chills or any other systemic symptoms.  He does have a history of lumbar arthritis but is never had an epidural steroid injection.  He did do physical therapy for this in the past which seemed to help.  No bowel or bladder change and no saddle paresthesias.  Review of Systems as detailed in HPI.  All others reviewed and are negative.   Objective: Vital Signs: There were no vitals taken for this visit.  Physical Exam well-developed well-nourished gentleman no acute distress.  Alert and oriented x3.  Ortho Exam examination of his left knee reveals a well-healed surgical incision.  No effusion.  Range of motion 0 to 120 degrees.  Stable valgus varus stress.  No patella maltracking.  Positive straight leg raise and negative logroll.  He is neurovascularly intact distally.  Specialty Comments:  No specialty comments available.  Imaging: Xr Knee 1-2 Views Left  Result Date: 02/10/2018 Well-seated prosthesis without evidence of subsidence or ostial lysis  Xr Lumbar Spine 2-3 Views  Result Date: 02/10/2018 Marked degenerative disc disease at L4-5    PMFS History: Patient Active Problem List   Diagnosis Date Noted  . Episodic lightheadedness 01/24/2018  . Greater trochanteric bursitis of right hip 01/08/2018  . Right hip pain 12/16/2017  . Fatigue 11/15/2017  . Acute gout involving toe of left foot 09/20/2017  . Chronic left shoulder pain 09/20/2017  . Total knee replacement status 05/15/2017  . Chronic gout of multiple sites 05/02/2017  . Contracture of toe, left 10/22/2016  . Healthcare maintenance 04/04/2016  . Osteoarthritis of both knees 12/26/2015  . Bilateral foot pain 10/26/2014  . History of stroke 09/02/2014  . De Quervain's  tenosynovitis, right 11/05/2013  . Pinguecula of both eyes 09/23/2013  . Hypothyroidism 05/25/2013  . Personal history of colonic adenomas 04/01/2013  . Knee pain, bilateral 04/28/2009  . Acute low back pain 03/25/2009  . SARCOIDOSIS 02/20/2007  . Hyperlipidemia associated with type 2 diabetes mellitus ([place]) 02/20/2007  . Controlled diabetes mellitus ([place]) 10/18/2006  . ERECTILE DYSFUNCTION 10/18/2006  . 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 Essential hypertension 10/18/2006  . CKD (chronic kidney disease) stage 3, GFR 30-59 ml/min ([place]) 09/05/2006   Past Medical History:  Diagnosis Date  . Arthritis   . CRD (chronic renal disease), stage II    Baseline Cr 1.2  . CVA (cerebral vascular accident) ([place])    x4 last one in 2007, R sided residual weakness  . HAMMER TOE 07/25/2010  . HLD (hyperlipidemia)   . HTN (hypertension)   . Hypothyroidism   . Leg swelling   . Lipoma of back 03/18/2011  . Personal history of colonic adenomas 04/01/2013  . Prostatitis    hx of x2  . PSEUDOFOLLICULITIS BARBAE 09/06/4968   Qualifier: Diagnosis of  By: Danise Mina  MD, Garlon Hatchet    . Sarcoidosis   . T2DM (type 2 diabetes mellitus) ([place])     Family History  Problem Relation Age of Onset  . Bone cancer Father   . Cancer Father 8       bone  . Uterine cancer Mother   . Cancer Mother   . Heart attack Brother   . Kidney disease Brother   . Diabetes Brother   . Arthritis Brother   . Heart disease Sister   . Congestive Heart Failure Sister   . Congestive Heart Failure Sister   . Congestive Heart Failure Sister   . Diabetes Sister   . Arthritis Sister   . Colon cancer Neg Hx   . Rectal cancer Neg Hx   . Stomach cancer Neg Hx     Past Surgical History:  Procedure Laterality Date  . COLONOSCOPY W/ POLYPECTOMY    . ERCP  2006   Normal  . [place]   x2 'inguinal  . LIPOMA EXCISION  06/22/2011   Procedure: EXCISION LIPOMA;  Surgeon: Rolm Bookbinder, MD;   Location: [facility];  Service: General;  Laterality: N/A;  . TOTAL KNEE ARTHROPLASTY Left 05/15/2017   Procedure: LEFT TOTAL KNEE ARTHROPLASTY;  Surgeon: Leandrew Koyanagi, MD;  Location: [facility];  Service: Orthopedics;  Laterality: Left;  . TRANSTHORACIC ECHOCARDIOGRAM  2005   EF 60%   Social History   Occupational History  . Not on file  Tobacco Use  . Smoking status: Never Smoker  . Smokeless tobacco: Never Used  Substance and Sexual Activity  . Alcohol use: No    Alcohol/week: 0.6 oz    Types: 1  Glasses of wine per week  . Drug use: No  . Sexual activity: Not Currently

## 2018-02-10 NOTE — Progress Notes (Deleted)
Post-Op Visit Note   Patient: Andrew [name]           Date of Birth: 03/06/1956           MRN: [id number] Visit Date: 02/10/2018 PCP: Bufford Lope, DO   Assessment & Plan:  Chief Complaint:  Chief Complaint  Patient presents with  . Left Knee - Pain   Visit Diagnoses:  1. Status post total left knee replacement   2. Unilateral primary osteoarthritis, left knee     Plan: ***  Follow-Up Instructions: No follow-ups on file.   Orders:  Orders Placed This Encounter  Procedures  . XR Knee 1-2 Views Left   No orders of the defined types were placed in this encounter.   Imaging: No results found.  PMFS History: Patient Active Problem List   Diagnosis Date Noted  . Episodic lightheadedness 01/24/2018  . Greater trochanteric bursitis of right hip 01/08/2018  . Right hip pain 12/16/2017  . Fatigue 11/15/2017  . Acute gout involving toe of left foot 09/20/2017  . Chronic left shoulder pain 09/20/2017  . Total knee replacement status 05/15/2017  . Chronic gout of multiple sites 05/02/2017  . Contracture of toe, left 10/22/2016  . Healthcare maintenance 04/04/2016  . Osteoarthritis of both knees 12/26/2015  . Bilateral foot pain 10/26/2014  . History of stroke 09/02/2014  . De Quervain's tenosynovitis, right 11/05/2013  . Pinguecula of both eyes 09/23/2013  . Hypothyroidism 05/25/2013  . Personal history of colonic adenomas 04/01/2013  . Knee pain, bilateral 04/28/2009  . SARCOIDOSIS 02/20/2007  . Hyperlipidemia associated with type 2 diabetes mellitus ([place]) 02/20/2007  . Controlled diabetes mellitus ([place]) 10/18/2006  . ERECTILE DYSFUNCTION 10/18/2006  . 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 Essential hypertension 10/18/2006  . CKD (chronic kidney disease) stage 3, GFR 30-59 ml/min ([place]) 09/05/2006   Past Medical History:  Diagnosis Date  . Arthritis   . CRD (chronic renal disease), stage II    Baseline Cr 1.2  . CVA (cerebral  vascular accident) ([place])    x4 last one in 2007, R sided residual weakness  . HAMMER TOE 07/25/2010  . HLD (hyperlipidemia)   . HTN (hypertension)   . Hypothyroidism   . Leg swelling   . Lipoma of back 03/18/2011  . Personal history of colonic adenomas 04/01/2013  . Prostatitis    hx of x2  . PSEUDOFOLLICULITIS BARBAE 01/12/9389   Qualifier: Diagnosis of  By: Danise Mina  MD, Garlon Hatchet    . Sarcoidosis   . T2DM (type 2 diabetes mellitus) ([place])     Family History  Problem Relation Age of Onset  . Bone cancer Father   . Cancer Father 45       bone  . Uterine cancer Mother   . Cancer Mother   . Heart attack Brother   . Kidney disease Brother   . Diabetes Brother   . Arthritis Brother   . Heart disease Sister   . Congestive Heart Failure Sister   . Congestive Heart Failure Sister   . Congestive Heart Failure Sister   . Diabetes Sister   . Arthritis Sister   . Colon cancer Neg Hx   . Rectal cancer Neg Hx   . Stomach cancer Neg Hx     Past Surgical History:  Procedure Laterality Date  . COLONOSCOPY W/ POLYPECTOMY    . ERCP  2006   Normal  . [place]   x2 'inguinal  .  LIPOMA EXCISION  06/22/2011   Procedure: EXCISION LIPOMA;  Surgeon: Rolm Bookbinder, MD;  Location: [facility];  Service: General;  Laterality: N/A;  . TOTAL KNEE ARTHROPLASTY Left 05/15/2017   Procedure: LEFT TOTAL KNEE ARTHROPLASTY;  Surgeon: Leandrew Koyanagi, MD;  Location: [facility];  Service: Orthopedics;  Laterality: Left;  . TRANSTHORACIC ECHOCARDIOGRAM  2005   EF 60%   Social History   Occupational History  . Not on file  Tobacco Use  . Smoking status: Never Smoker  . Smokeless tobacco: Never Used  Substance and Sexual Activity  . Alcohol use: No    Alcohol/week: 0.6 oz    Types: 1 Glasses of wine per week  . Drug use: No  . Sexual activity: Not Currently

## 2018-02-19 ENCOUNTER — Other Ambulatory Visit: Payer: Self-pay | Admitting: Internal Medicine

## 2018-02-25 ENCOUNTER — Ambulatory Visit: Payer: Medicare HMO | Admitting: Family Medicine

## 2018-03-05 ENCOUNTER — Encounter: Payer: Self-pay | Admitting: Family Medicine

## 2018-03-05 ENCOUNTER — Ambulatory Visit (INDEPENDENT_AMBULATORY_CARE_PROVIDER_SITE_OTHER): Payer: Medicare HMO | Admitting: Family Medicine

## 2018-03-05 VITALS — BP 122/80 | HR 67 | Temp 98.2°F | Ht 73.0 in | Wt 201.8 lb

## 2018-03-05 DIAGNOSIS — Z23 Encounter for immunization: Secondary | ICD-10-CM

## 2018-03-05 DIAGNOSIS — I10 Essential (primary) hypertension: Secondary | ICD-10-CM

## 2018-03-05 DIAGNOSIS — N182 Chronic kidney disease, stage 2 (mild): Secondary | ICD-10-CM | POA: Diagnosis not present

## 2018-03-05 DIAGNOSIS — E1122 Type 2 diabetes mellitus with diabetic chronic kidney disease: Secondary | ICD-10-CM

## 2018-03-05 MED ORDER — BENZOYL PEROXIDE 5 % EX GEL: Freq: Every day | CUTANEOUS | 0 refills | Status: DC

## 2018-03-05 NOTE — Assessment & Plan Note (Signed)
Stable and well-controlled.  Continue Victoza 1.2 mg daily.  Discussed that if Victoza becomes unaffordable that would be okay to retrial metformin given his most recent kidney function from June 2018 showing creatinine of 1.37 with a GFR of 64.  Patient will self schedule his diabetic eye exam this year.  Follow-up in 3 months

## 2018-03-05 NOTE — Progress Notes (Signed)
    Subjective:  Andrew [name] is a 62 y.o. male who presents to the [facility] today for diabetes follow up  HPI:  Diabetes Medication compliance: compliant all of the time. Doing well on Victoza 1.2 mg daily, tolerating well.  He is currently able to afford this medication but the cost will go up in a month or two.  He does not want to change any medications at this time.  He does say that the needles for the Victoza that were sent and were very long and he would like a shorter needle next time.  He plans to bring in the box of the needles that he would like in the year at the front desk when it is time to reorder Diabetic diet compliance: compliant all of the time.  He is very mindful of his carb and sugar intake Home glucose monitoring: is performed regularly, has his glucometer with him today and his sugars have been ranging from low 100s to 140s and average has been in 120s Further diabetic ROS: no polyuria or polydipsia, no chest pain, dyspnea or TIA's, no numbness, tingling or pain in extremities, no unusual visual symptoms, no hypoglycemia, no medication side effects noted, last eye exam approximately 1 ago.     Hypertension  taking medications as instructed: On amlodipine 5 mg daily and enalapril 20 mg daily, no medication side effects noted, no TIA's, no chest pain on exertion, no dyspnea on exertion, no swelling of ankles, no orthostatic dizziness or lightheadedness and no palpitations.  New concerns: none.    ROS: Per HPI  PMH: He reports that he has never smoked. He has never used smokeless tobacco. He reports that he does not drink alcohol or use drugs.   Objective:  Physical Exam: BP 122/80 (BP Location: Left Arm, Patient Position: Sitting, Cuff Size: Normal)   Pulse 67   Temp 98.2 F (36.8 C) (Oral)   Ht 6\' 1"  (1.854 m)   Wt 201 lb 12.8 oz (91.5 kg)   SpO2 98%   BMI 26.62 kg/m   Gen: NAD, resting comfortably CV: RRR with no murmurs appreciated Pulm: NWOB, CTAB  with no crackles, wheezes, or rhonchi GI: Normal bowel sounds present. Soft, Nontender, Nondistended. MSK: no edema, cyanosis, or clubbing noted Skin: warm, dry Neuro: grossly normal, moves all extremities Psych: Normal affect and thought content   Assessment/Plan:  Controlled diabetes mellitus ([place]) Stable and well-controlled.  Continue Victoza 1.2 mg daily.  Discussed that if Victoza becomes unaffordable that would be okay to retrial metformin given his most recent kidney function from June 2018 showing creatinine of 1.37 with a GFR of 64.  Patient will self schedule his diabetic eye exam this year.  Follow-up in 3 months  Essential hypertension Controlled and at goal.  Doing well on current regimen of amlodipine 5 mg daily and enalapril 20 mg daily.   Bufford Lope, DO PGY-3, [facility] 03/05/2018 9:07 AM

## 2018-03-05 NOTE — Patient Instructions (Addendum)
It was good to see you today!  Your diabetes and your blood pressure look good. Please get your diabetic yearly eye exam  Thank you for getting your flu shot today.  Please check-out at the front desk before leaving the clinic. Make an appointment in 3 months for diabetes follow up.   Please bring all of your medications with you to each visit.   Sign up for My Chart to have easy access to your labs results, and communication with your primary care physician.  Feel free to call with any questions or concerns at any time, at [phone].   Take care,  Dr. Bufford Lope, [place]

## 2018-03-05 NOTE — Assessment & Plan Note (Signed)
Controlled and at goal.  Doing well on current regimen of amlodipine 5 mg daily and enalapril 20 mg daily.

## 2018-03-11 ENCOUNTER — Telehealth: Payer: Self-pay

## 2018-03-11 DIAGNOSIS — N182 Chronic kidney disease, stage 2 (mild): Principal | ICD-10-CM

## 2018-03-11 DIAGNOSIS — E1122 Type 2 diabetes mellitus with diabetic chronic kidney disease: Secondary | ICD-10-CM

## 2018-03-11 MED ORDER — METFORMIN HCL 500 MG PO TABS: 500.0000 mg | ORAL_TABLET | Freq: Two times a day (BID) | ORAL | 0 refills | Status: DC

## 2018-03-11 NOTE — Telephone Encounter (Signed)
Called patient. Retrial metformin 500mg  BID. Discussed starting at daily and eating with food to avoid diarrhea. Discussed may need to go up to 1000mg  BID. Patient voiced good understanding and plans to follow up in 1 month.

## 2018-03-11 NOTE — Telephone Encounter (Signed)
Pt called nurse line, wants to change his Victoza to something different, states it is too expensive.Pt's call back [phone] Wallace Cullens, RN

## 2018-03-31 ENCOUNTER — Other Ambulatory Visit: Payer: Self-pay

## 2018-03-31 ENCOUNTER — Encounter: Payer: Self-pay | Admitting: Family Medicine

## 2018-03-31 ENCOUNTER — Ambulatory Visit (INDEPENDENT_AMBULATORY_CARE_PROVIDER_SITE_OTHER): Payer: Medicare HMO | Admitting: Family Medicine

## 2018-03-31 VITALS — BP 118/65 | HR 65 | Temp 98.0°F | Wt 202.0 lb

## 2018-03-31 DIAGNOSIS — N182 Chronic kidney disease, stage 2 (mild): Secondary | ICD-10-CM

## 2018-03-31 DIAGNOSIS — Z86010 Personal history of colonic polyps: Secondary | ICD-10-CM | POA: Diagnosis not present

## 2018-03-31 DIAGNOSIS — D126 Benign neoplasm of colon, unspecified: Secondary | ICD-10-CM | POA: Diagnosis not present

## 2018-03-31 DIAGNOSIS — E1122 Type 2 diabetes mellitus with diabetic chronic kidney disease: Secondary | ICD-10-CM

## 2018-03-31 MED ORDER — METFORMIN HCL 1000 MG PO TABS: 1000.0000 mg | ORAL_TABLET | Freq: Two times a day (BID) | ORAL | 2 refills | Status: DC

## 2018-03-31 NOTE — Progress Notes (Signed)
    Subjective:  Andrew [name] is a 62 y.o. male who presents to the [facility] today for diabetes follow up  HPI:  SUBJECTIVE: 62 y.o. male for follow up of diabetes.   Started metformin 500 mg twice daily, tolerating well, compliant.  It does make him feel little bit tired but otherwise he has no issues. Does follow a diabetic diet and only occasionally has sweets. He is continue to check his blood sugars at home and they have been ranging in the low 100s to 1 teens.  He has had one low at 95 for which he ate some candy. Last eye exam approximately 1 ago, he plans to self schedule.  Other symptoms and concerns: none  Health maintenance.  He recalls being told he needed a 5-year follow-up on his colonoscopy due to the polyps that were found in 2014.  He is agreeable and needs a referral today.  ROS: per HPI.  Otherwise no chest pain, shortness of breath, lightheadedness, dizziness, nausea, vomiting, numbness, tingling, polyuria, polydipsia.   Social Hx: He reports that he has never smoked. He has never used smokeless tobacco. He reports that he does not drink alcohol or use drugs.   Objective:  Physical Exam: BP 118/65   Pulse 65   Temp 98 F (36.7 C) (Oral)   Wt 202 lb (91.6 kg)   SpO2 99%   BMI 26.65 kg/m   Gen: NAD, resting comfortably CV: RRR with no murmurs appreciated Pulm: NWOB, CTAB with no crackles, wheezes, or rhonchi GI: Normal bowel sounds present. Soft, Nontender, Nondistended. MSK: no edema, cyanosis, or clubbing noted Skin: warm, dry Neuro: grossly normal, moves all extremities Psych: Normal affect and thought content  Assessment/Plan:  Controlled diabetes mellitus ([place]) Stable and well-controlled.  Increase metformin 1000 mg twice daily. Check BMP today. Follow up in 3 months  Personal history of colonic adenomas Referral to GI placed today for patient to get his repeat colonoscopy as his last one was on 03/24/2013 and was found to have 2 diminutive  adenomas requiring a 5-year follow-up.   Bufford Lope, DO PGY-3, [facility] 03/31/2018 2:48 PM

## 2018-03-31 NOTE — Assessment & Plan Note (Signed)
Stable and well-controlled.  Increase metformin 1000 mg twice daily. Check BMP today. Follow up in 3 months

## 2018-03-31 NOTE — Patient Instructions (Addendum)
Increase metformin to 1000mg  twice a day  Referral to [facility] gastroenterology placed today. Someone should call you with the appointment.  Address: [address] [place], [place], [place] [address] Phone: [phone]     Send [place] a copy of your diabetic eye exam please.    See you back in 3 months for a diabetes follow up.    We are checking some labs today. If results require attention, either myself or my nurse will get in touch with you. If everything is normal, you will get a letter in the mail or a message in My Chart. Please give [place] a call if you do not hear from [place] after 2 weeks.

## 2018-03-31 NOTE — Assessment & Plan Note (Signed)
Referral to GI placed today for patient to get his repeat colonoscopy as his last one was on 03/24/2013 and was found to have 2 diminutive adenomas requiring a 5-year follow-up.

## 2018-04-01 ENCOUNTER — Encounter: Payer: Self-pay | Admitting: Internal Medicine

## 2018-04-01 ENCOUNTER — Encounter: Payer: Self-pay | Admitting: Family Medicine

## 2018-04-01 LAB — BASIC METABOLIC PANEL
BUN / CREAT RATIO: 11 (ref 10–24)
BUN: 16 mg/dL (ref 8–27)
CHLORIDE: 98 mmol/L (ref 96–106)
CO2: 25 mmol/L (ref 20–29)
Calcium: 9.5 mg/dL (ref 8.6–10.2)
Creatinine, Ser: 1.41 mg/dL — ABNORMAL HIGH (ref 0.76–1.27)
GFR, EST AFRICAN AMERICAN: 61 mL/min/{1.73_m2} (ref >59)
GFR, EST NON AFRICAN AMERICAN: 53 mL/min/{1.73_m2} — AB (ref >59)
Glucose: 110 mg/dL — ABNORMAL HIGH (ref 65–99)
Potassium: 4.4 mmol/L (ref 3.5–5.2)
Sodium: 138 mmol/L (ref 134–144)

## 2018-04-02 ENCOUNTER — Other Ambulatory Visit: Payer: Self-pay | Admitting: Internal Medicine

## 2018-04-02 ENCOUNTER — Other Ambulatory Visit: Payer: Self-pay | Admitting: Family Medicine

## 2018-04-02 DIAGNOSIS — N182 Chronic kidney disease, stage 2 (mild): Principal | ICD-10-CM

## 2018-04-02 DIAGNOSIS — E1122 Type 2 diabetes mellitus with diabetic chronic kidney disease: Secondary | ICD-10-CM

## 2018-04-10 ENCOUNTER — Ambulatory Visit (INDEPENDENT_AMBULATORY_CARE_PROVIDER_SITE_OTHER): Payer: Medicare HMO

## 2018-04-10 ENCOUNTER — Telehealth: Payer: Self-pay | Admitting: Family Medicine

## 2018-04-10 VITALS — BP 120/80 | HR 54 | Temp 98.1°F | Ht 73.0 in | Wt 202.6 lb

## 2018-04-10 DIAGNOSIS — Z0000 Encounter for general adult medical examination without abnormal findings: Secondary | ICD-10-CM | POA: Diagnosis not present

## 2018-04-10 NOTE — Patient Instructions (Addendum)
Andrew [name] , Thank you for taking time to come for your Medicare Wellness Visit. I appreciate your ongoing commitment to your health goals. Please review the following plan we discussed and let me know if I can assist you in the future.   These are the goals we discussed: Goals      Patient Stated   . Weight (lb) < 200 lb (90.7 kg) (pt-stated)     Did get to 197 lbs but back to 202.      Other   . Blood Pressure < 130/80    . HEMOGLOBIN A1C < 6.5       This is a list of the screening recommended for you and due dates:  Health Maintenance  Topic Date Due  . Colon Cancer Screening  05/21/2018*  . Eye exam for diabetics  08/09/2018*  . Complete foot exam   06/26/2018  . Hemoglobin A1C  08/09/2018  . Tetanus Vaccine  04/14/2024  . Flu Shot  Completed  . Pneumococcal vaccine  Completed  .  Hepatitis C: One time screening is recommended by Center for Disease Control  (CDC) for  adults born from [birth year] through 1965.   Completed  . HIV Screening  Completed  *Topic was postponed. The date shown is not the original due date.     Fall Prevention in the Home Falls can cause injuries. They can happen to people of all ages. There are many things you can do to make your home safe and to help prevent falls. What can I do on the outside of my home?  Regularly fix the edges of walkways and driveways and fix any cracks.  Remove anything that might make you trip as you walk through a door, such as a raised step or threshold.  Trim any bushes or trees on the path to your home.  Use bright outdoor lighting.  Clear any walking paths of anything that might make someone trip, such as rocks or tools.  Regularly check to see if handrails are loose or broken. Make sure that both sides of any steps have handrails.  Any raised decks and porches should have guardrails on the edges.  Have any leaves, snow, or ice cleared regularly.  Use sand or salt on walking paths during winter.  Clean up any  spills in your garage right away. This includes oil or grease spills. What can I do in the bathroom?  Use night lights.  Install grab bars by the toilet and in the tub and shower. Do not use towel bars as grab bars.  Use non-skid mats or decals in the tub or shower.  If you need to sit down in the shower, use a plastic, non-slip stool.  Keep the floor dry. Clean up any water that spills on the floor as soon as it happens.  Remove soap buildup in the tub or shower regularly.  Attach bath mats securely with double-sided non-slip rug tape.  Do not have throw rugs and other things on the floor that can make you trip. What can I do in the bedroom?  Use night lights.  Make sure that you have a light by your bed that is easy to reach.  Do not use any sheets or blankets that are too big for your bed. They should not hang down onto the floor.  Have a firm chair that has side arms. You can use this for support while you get dressed.  Do not have throw rugs and other  things on the floor that can make you trip. What can I do in the kitchen?  Clean up any spills right away.  Avoid walking on wet floors.  Keep items that you use a lot in easy-to-reach places.  If you need to reach something above you, use a strong step stool that has a grab bar.  Keep electrical cords out of the way.  Do not use floor polish or wax that makes floors slippery. If you must use wax, use non-skid floor wax.  Do not have throw rugs and other things on the floor that can make you trip. What can I do with my stairs?  Do not leave any items on the stairs.  Make sure that there are handrails on both sides of the stairs and use them. Fix handrails that are broken or loose. Make sure that handrails are as long as the stairways.  Check any carpeting to make sure that it is firmly attached to the stairs. Fix any carpet that is loose or worn.  Avoid having throw rugs at the top or bottom of the stairs. If you  do have throw rugs, attach them to the floor with carpet tape.  Make sure that you have a light switch at the top of the stairs and the bottom of the stairs. If you do not have them, ask someone to add them for you. What else can I do to help prevent falls?  Wear shoes that: ? Do not have high heels. ? Have rubber bottoms. ? Are comfortable and fit you well. ? Are closed at the toe. Do not wear sandals.  If you use a stepladder: ? Make sure that it is fully opened. Do not climb a closed stepladder. ? Make sure that both sides of the stepladder are locked into place. ? Ask someone to hold it for you, if possible.  Clearly mark and make sure that you can see: ? Any grab bars or handrails. ? First and last steps. ? Where the edge of each step is.  Use tools that help you move around (mobility aids) if they are needed. These include: ? Canes. ? Walkers. ? Scooters. ? Crutches.  Turn on the lights when you go into a dark area. Replace any light bulbs as soon as they burn out.  Set up your furniture so you have a clear path. Avoid moving your furniture around.  If any of your floors are uneven, fix them.  If there are any pets around you, be aware of where they are.  Review your medicines with your doctor. Some medicines can make you feel dizzy. This can increase your chance of falling. Ask your doctor what other things that you can do to help prevent falls. This information is not intended to replace advice given to you by your health care provider. Make sure you discuss any questions you have with your health care provider. Document Released: 04/21/2009 Document Revised: 12/01/2015 Document Reviewed: 07/30/2014 Elsevier Interactive Patient Education  [employer].

## 2018-04-10 NOTE — Telephone Encounter (Signed)
-----   Message from Esau Grew, RN sent at 04/10/2018  5:25 PM EDT ----- Dr Shawna Orleans, please review, sign, and close. Patient does not agree that his Metformin was to be increased to 1000 mg BID. Please review and contact patient. Patient also has financial concerns around his medications if anything can be modified to be less expensive.  Neoma Laming, pate

## 2018-04-10 NOTE — Progress Notes (Signed)
I have reviewed this visit and discussed with Alisa Brake, RN, and agree with her documentation. 

## 2018-04-10 NOTE — Progress Notes (Signed)
Subjective:   Andrew [name] is a 62 y.o. male who presents for Medicare Annual/Subsequent preventive examination. The patient was informed that the wellness visit is to identify future health risk and educate and initiate measures that can reduce risk for increased disease through the lifespan.   Review of Systems:  Physical assessment deferred to PCP.  Cardiac Risk Factors include: advanced age (>23mn, >>60women);diabetes mellitus;hypertension;male gender    Objective:    Vitals: BP 120/80   Pulse (!) 54   Temp 98.1 F (36.7 C) (Oral)   Ht '6\' 1"'  (1.854 m)   Wt 202 lb 9.6 oz (91.9 kg)   SpO2 99%   BMI 26.73 kg/m   Body mass index is 26.73 kg/m.  Advanced Directives 04/10/2018 03/31/2018 03/05/2018 02/06/2018 01/24/2018 12/16/2017 10/30/2017  Does Patient Have a Medical Advance Directive? No No No No No No No  Type of Advance Directive - - - - - - -  Does patient want to make changes to medical advance directive? - - - - - - -  Copy of [place] Chart? - - - - - - -  Would patient like information on creating a medical advance directive? No - Patient declined No - Patient declined No - Patient declined No - Patient declined No - Patient declined No - Patient declined No - Patient declined    Tobacco Social History   Tobacco Use  Smoking Status Never Smoker  Smokeless Tobacco Never Used  Tobacco Comment   Never smoker; no plans to start     Counseling given: Yes Comment: Never smoker; no plans to start  Clinical Intake:  Pre-visit preparation completed: Yes  Pain : No/denies pain Pain Score: 0-No pain   Nutritional Status: BMI 25 -29 Overweight Diabetes: Yes CBG done?: No Did pt. bring in CBG monitor from home?: Yes Glucose Meter Downloaded?: No  How often do you need to have someone help you when you read instructions, pamphlets, or other written materials from your doctor or pharmacy?: 1 - Never What is the last grade level you  completed in school?: college - 2 years  Interpreter Needed?: No    Past Medical History:  Diagnosis Date  . Arthritis   . CRD (chronic renal disease), stage II    Baseline Cr 1.2  . CVA (cerebral vascular accident) ([place]    x4 last one in 2007, R sided residual weakness  . HAMMER TOE 07/25/2010  . HLD (hyperlipidemia)   . HTN (hypertension)   . Hypothyroidism   . Leg swelling   . Lipoma of back 03/18/2011  . Personal history of colonic adenomas 04/01/2013  . Prostatitis    hx of x2  . PSEUDOFOLLICULITIS BARBAE 98/12/4845  Qualifier: Diagnosis of  By: GDanise Mina MD, JGarlon Hatchet   . Sarcoidosis   . T2DM (type 2 diabetes mellitus) ([place]    Past Surgical History:  Procedure Laterality Date  . COLONOSCOPY W/ POLYPECTOMY    . ERCP  2006   Normal  . [place]  x2 'inguinal  . LIPOMA EXCISION  06/22/2011   Procedure: EXCISION LIPOMA;  Surgeon: MRolm Bookbinder MD;  [facility]  Service: General;  Laterality: N/A;  . TOTAL KNEE ARTHROPLASTY Left 05/15/2017   Procedure: LEFT TOTAL KNEE ARTHROPLASTY;  Surgeon: XLeandrew Koyanagi MD;  [facility]  Service: Orthopedics;  Laterality: Left;  . TRANSTHORACIC ECHOCARDIOGRAM  2005   EF 60%  Family History  Problem Relation Age of Onset  . Bone cancer Father   . Cancer Father 63       bone  . Uterine cancer Mother   . Cancer Mother   . Heart attack Brother   . Kidney disease Brother   . Diabetes Brother   . Arthritis Brother   . Heart disease Sister   . Congestive Heart Failure Sister   . Congestive Heart Failure Sister   . Congestive Heart Failure Sister   . Cancer Sister   . Diabetes Sister   . Arthritis Sister   . Colon cancer Neg Hx   . Rectal cancer Neg Hx   . Stomach cancer Neg Hx    Social History   Socioeconomic History  . Marital status: Divorced    Spouse name: Not on file  . Number of children: 1  . Years of education: 63  . Highest education level: Associate degree:  academic program  Occupational History  . 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 Financial resource strain: Hard  . Food insecurity:    Worry: Never true    Inability: Never true  . Transportation needs:    Medical: No    Non-medical: No  Tobacco Use  . Smoking status: Never Smoker  . Smokeless tobacco: Never Used  . Tobacco comment: Never smoker; no plans to start  Substance and Sexual Activity  . Alcohol use: No    Alcohol/week: 1.0 standard drinks    Types: 1 Glasses of wine per week  . Drug use: No  . Sexual activity: Not Currently  Lifestyle  . Physical activity:    Days per week: 4 days    Minutes per session: 30 min  . Stress: Only a little  Relationships  . Social connections:    Talks on phone: More than three times a week    Gets together: Once a week    Attends religious service: More than 4 times per year    Active member of club or organization: No    Attends meetings of clubs or organizations: Never    Relationship status: Divorced  Other Topics Concern  . Not on file  Social History Narrative   Patient is a retired Social worker at [employer]. Remote etoh and tobacco history, stopped 20 years ago. 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      Lives in home with son, and dog. Home is one level with 3 steps in go in. Has hand rails. No smoke alarms. Will contact fire department to seek assistance on smoke alarms. No grab-bars in bathroom. Throw rugs are flat and attached. Walks the dog daily for exercise.   Always wears seat belt in car.      Patient does state some financial insecurity around paying for his medications, including co-pays. Had to stop insulin due to cost.    Patient needs to have his dentures modified but has not dental coverage. Will ask LCSW to reach out with resources.      Outpatient Encounter Medications as of 04/10/2018  Medication Sig  . acetaminophen (TYLENOL) 650  MG CR tablet Take 650 mg by mouth daily as needed for pain.  Marland Kitchen allopurinol (ZYLOPRIM) 100 MG tablet TAKE 2 TABLETS EVERY DAY  . amLODipine (NORVASC) 5 MG tablet TAKE 1 TABLET EVERY DAY  . aspirin EC 81 MG tablet Take 81 mg by mouth daily.  . benzoyl peroxide 5 %  gel Apply topically daily.  . Blood Glucose Monitoring Suppl (BLOOD GLUCOSE METER) kit Use to test fasting glucose daily.  Diagnosis Type II diabetes 250.0  . cholecalciferol (VITAMIN D) 1000 units tablet TAKE 1 TABLET EVERY DAY  . docusate sodium (COLACE) 100 MG capsule Take 100 mg by mouth daily.  . enalapril (VASOTEC) 20 MG tablet TAKE 2 TABLETS EVERY DAY  . Insulin Pen Needle 29G X 12.7MM MISC 1 each daily with victoza  . levothyroxine (SYNTHROID, LEVOTHROID) 75 MCG tablet TAKE 1 TABLET EVERY DAY  . lovastatin (MEVACOR) 40 MG tablet TAKE 1 TABLET AT BEDTIME  . metFORMIN (GLUCOPHAGE) 1000 MG tablet Take 1 tablet (1,000 mg total) by mouth 2 (two) times daily with a meal.  . PRODIGY LANCETS 26G MISC Use to check fasting blood glucose each morning.  Diagnosis: Diabetes Mellitus Type II 250.00  . TRUE METRIX BLOOD GLUCOSE TEST test strip USE  TO TEST BLOOD SUGAR TWICE DAILY  FOR  TYPE  II  DIABETES   No facility-administered encounter medications on file as of 04/10/2018.     Activities of Daily Living In your present state of health, do you have any difficulty performing the following activities: 04/10/2018 06/13/2017  Hearing? N N  Vision? N N  Difficulty concentrating or making decisions? N N  Walking or climbing stairs? Y Y  Comment knee  replacement -  Dressing or bathing? N N  Doing errands, shopping? N Y  Conservation officer, nature and eating ? N -  Using the Toilet? N -  In the past six months, have you accidently leaked urine? N -  Do you have problems with loss of bowel control? N -  Managing your Medications? N -  Managing your Finances? N -  Housekeeping or managing your Housekeeping? N -  Some recent data might be hidden    Patient Care Team: Bufford Lope, DO as PCP - General (Family Medicine) Rutherford Guys, MD as Consulting Physician (Ophthalmology) Gatha Mayer, MD as Consulting Physician (Gastroenterology) Leandrew Koyanagi, MD as Attending Physician (Orthopedic Surgery)   Assessment:  This is a routine wellness examination for Chuckie. All health maintenance is up to date. Colonoscopy scheduled for 05/21/18. Eye exam scheduled for 08/2018.   Exercise Activities and Dietary recommendations Current Exercise Habits: Home exercise routine, Type of exercise: strength training/weights;walking, Time (Minutes): 30, Frequency (Times/Week): 4, Weekly Exercise (Minutes/Week): 120, Intensity: Mild, Exercise limited by: orthopedic condition(s)  Goals      Patient Stated   . Weight (lb) < 200 lb (90.7 kg) (pt-stated)     Did get to 197 lbs but back to 202.      Other   . Blood Pressure < 130/80    . HEMOGLOBIN A1C < 6.5       Fall Risk Fall Risk  04/10/2018 03/05/2018 01/24/2018 10/30/2017 07/26/2017  Falls in the past year? No No No No No  Number falls in past yr: - - - - -  Injury with Fall? - - - - -  Risk for fall due to : - - - - -  Risk for fall due to: Comment - - - - -   Is the patient's home free of loose throw rugs in walkways, pet beds, electrical cords, etc?   yes      Grab bars in the bathroom? no      Handrails on the stairs?   yes      Adequate lighting?   yes  Timed Get  Up and Go Performed: 6 seconds, no problems  Depression Screen PHQ 2/9 Scores 04/10/2018 03/31/2018 03/05/2018 02/06/2018  PHQ - 2 Score 0 0 0 0  PHQ- 9 Score - - - -  Exception Documentation - - - -    Cognitive Function MMSE - Mini Mental State Exam 04/10/2018  Orientation to time 5  Orientation to Place 5  Registration 3  Attention/ Calculation 5  Recall 2  Language- name 2 objects 2  Language- repeat 1  Language- follow 3 step command 3  Language- read & follow direction 1  Write a sentence 1  Copy design 1   Total score 29     6CIT Screen 04/10/2018  What Year? 0 points  What month? 0 points  What time? 0 points  Count back from 20 0 points  Months in reverse 2 points  Repeat phrase 0 points  Total Score 2   Mini-Cog score of 4.  Immunization History  Administered Date(s) Administered  . Influenza Split 04/30/2011, 03/21/2012  . Influenza Whole 05/12/2007, 04/28/2009, 07/25/2010  . Influenza,inj,Quad PF,6+ Mos 03/20/2013, 04/14/2014, 03/24/2015, 04/03/2016, 03/15/2017, 03/05/2018  . Pneumococcal Conjugate-13 05/10/2015  . Pneumococcal Polysaccharide-23 04/14/2014  . Td 05/09/2004  . Zoster 04/30/2016   Screening Tests Health Maintenance  Topic Date Due  . COLONOSCOPY  05/21/2018 (Originally 03/24/2018)  . OPHTHALMOLOGY EXAM  08/09/2018 (Originally 11/19/2017)  . FOOT EXAM  06/26/2018  . HEMOGLOBIN A1C  08/09/2018  . TETANUS/TDAP  04/14/2024  . INFLUENZA VACCINE  Completed  . PNEUMOCOCCAL POLYSACCHARIDE VACCINE AGE 51-64 HIGH RISK  Completed  . Hepatitis C Screening  Completed  . HIV Screening  Completed   Cancer Screenings: Lung: Low Dose CT Chest recommended if Age 6-80 years, 30 pack-year currently smoking OR have quit w/in 15years. Patient does not qualify. Colorectal: 05/21/18  Additional Screenings:  Hepatitis C Screening: complete     Plan:  Patient will keep his upcoming colonoscopy and ophthalmology appointments. Will see PCP in December for next follow-up. Will ask LCSW to reach out to patient with dental resources.    I have personally reviewed and noted the following in the patient's chart:   . Medical and social history . Use of alcohol, tobacco or illicit drugs  . Current medications and supplements . Functional ability and status . Nutritional status . Physical activity . Advanced directives . List of other physicians . Hospitalizations, surgeries, and ER visits in previous 12 months . Vitals . Screenings to include cognitive, depression, and  falls . Referrals and appointments  In addition, I have reviewed and discussed with patient certain preventive protocols, quality metrics, and best practice recommendations. A written personalized care plan for preventive services as well as general preventive health recommendations were provided to patient.     Esau Grew, RN  04/10/2018

## 2018-04-10 NOTE — Telephone Encounter (Signed)
Talked with patient. He actually wants to metformin 1000mg  BID because he has still been on 500mg  BID currently and noticed his sugar was high this morning. Discussed we could always go back down to 500mg  BID if he does not tolerate the side effects. He voiced understanding and appreciation.

## 2018-04-11 ENCOUNTER — Telehealth: Payer: Self-pay | Admitting: Licensed Clinical Social Worker

## 2018-04-11 NOTE — Progress Notes (Signed)
Intern assessed patient's needs and barriers.   We discussed interventions and I concur with the plan. See note 04/11/18.  Casimer Lanius, [place]   [phone] 3:16 PM

## 2018-04-11 NOTE — Progress Notes (Signed)
Type of Service: Clinical Social Work  [facility] intern phone call to patient after receiving referral from Dr. Dayna Ramus for denture modification resources. Patient had been seen at [facility] who provided the dentures for free due to his history of strokes. Patient reports being told by dentist(Dr. Oren Binet) that he was unable to get them refurbished for free any longer.[facility] intern contacted the dentistry and left a message for coordinator about financial assistance. patient possibly is involved in Chief Executive Officer.  Issues Discussed: patient getting financial assistance for his dentures that are currently uncomfortable and causing him pain  Plan: 1. [facility] intern will wait for follow up call from dentistry to discuss financial assistance options for patient.  2. [facility] intern will follow up with patient in 5-7 business days to discuss dental options.   Audry Riles, [place] intern Behavioral Health Clinician,  [place] [phone]

## 2018-04-16 ENCOUNTER — Telehealth: Payer: Self-pay | Admitting: Licensed Clinical Social Worker

## 2018-04-16 NOTE — Progress Notes (Signed)
Type of Service: Clinical Social Work  [facility] intern phone call to patient to follow up with patient on dental resources for patient getting dentures modified. [facility] intern contacted patients dentistry [facility] and spoke with Jinny Blossom who told me that the patients dentures would be paid for. [facility] intern communicated this with patient and left patient my number to follow up.    Plan: 1. Patient will call [facility] on 10/10 to schedule appointment 2. [facility] intern will Follow up with patient in 5-7 days.   Audry Riles, [place] intern Behavioral Health Clinician,  [place] [phone]

## 2018-04-25 ENCOUNTER — Telehealth: Payer: Self-pay | Admitting: Licensed Clinical Social Worker

## 2018-04-25 NOTE — BH Specialist Note (Deleted)
Type of Service: Clinical Social Work  [facility] intern phone call to patient to follow up with dental referral. Patient reported that [facility] fixed his dentures free of charge and would continue to do this for this patient. [facility] intern left number for patient to call shall any other concerns arise.    Audry Riles, [place] intern Behavioral Health Clinician,  [place] [phone]

## 2018-04-25 NOTE — Telephone Encounter (Signed)
Type of Service: Clinical Social Work  [facility] intern phone call to patient to follow up with dental referral. Patient reported that [facility] fixed his dentures free of charge and would continue to do this for this patient. [facility] intern left number for patient to call shall any other concerns arise.    Audry Riles, [place] intern Behavioral Health Clinician,  [place] [phone]

## 2018-05-06 ENCOUNTER — Encounter: Payer: Self-pay | Admitting: Internal Medicine

## 2018-05-06 ENCOUNTER — Telehealth: Payer: Self-pay | Admitting: Internal Medicine

## 2018-05-06 ENCOUNTER — Ambulatory Visit (AMBULATORY_SURGERY_CENTER): Payer: Self-pay | Admitting: *Deleted

## 2018-05-06 VITALS — Ht 73.0 in | Wt 202.0 lb

## 2018-05-06 DIAGNOSIS — Z86010 Personal history of colonic polyps: Secondary | ICD-10-CM

## 2018-05-06 NOTE — Telephone Encounter (Signed)
Hi Robbin, pt had pre-visit with you this morning, he called to inform that he forgot to tell you that he has kidney disease.

## 2018-05-06 NOTE — Progress Notes (Signed)
Patient denies any allergies to eggs or soy. Patient denies any problems with anesthesia/sedation. Patient denies any oxygen use at home. Patient denies taking any diet/weight loss medications or blood thinners.  

## 2018-05-06 NOTE — Telephone Encounter (Signed)
Notified patient that kidney disease is on his chart. Explained that he can drink the prep as instructed but drink plenty so he does not get dehydrated. Pt verbalizes understanding.

## 2018-05-20 ENCOUNTER — Encounter: Payer: Self-pay | Admitting: Internal Medicine

## 2018-05-20 ENCOUNTER — Ambulatory Visit (AMBULATORY_SURGERY_CENTER): Payer: Medicare HMO | Admitting: Internal Medicine

## 2018-05-20 VITALS — BP 130/70 | HR 60 | Temp 98.0°F | Resp 17 | Ht 73.0 in | Wt 202.0 lb

## 2018-05-20 DIAGNOSIS — Z86010 Personal history of colonic polyps: Secondary | ICD-10-CM | POA: Diagnosis not present

## 2018-05-20 DIAGNOSIS — K635 Polyp of colon: Secondary | ICD-10-CM

## 2018-05-20 DIAGNOSIS — D122 Benign neoplasm of ascending colon: Secondary | ICD-10-CM

## 2018-05-20 MED ORDER — SODIUM CHLORIDE 0.9 % IV SOLN: 500.0000 mL | Freq: Once | INTRAVENOUS | Status: DC

## 2018-05-20 NOTE — Patient Instructions (Addendum)
I found and removed one tiny polyp. You still have diverticulosis - thickened muscle rings and pouches in the colon wall. Please read the handout about this condition.  I will let you know pathology results and when to have another routine colonoscopy by mail and/or My Chart.  I appreciate the opportunity to care for you. Gatha Mayer, MD, FACG YOU HAD AN ENDOSCOPIC PROCEDURE TODAY AT [facility]:   Refer to the procedure report that was given to you for any specific questions about what was found during the examination.  If the procedure report does not answer your questions, please call your gastroenterologist to clarify.  If you requested that your care partner not be given the details of your procedure findings, then the procedure report has been included in a sealed envelope for you to review at your convenience later.  YOU SHOULD EXPECT: Some feelings of bloating in the abdomen. Passage of more gas than usual.  Walking can help get rid of the air that was put into your GI tract during the procedure and reduce the bloating. If you had a lower endoscopy (such as a colonoscopy or flexible sigmoidoscopy) you may notice spotting of blood in your stool or on the toilet paper. If you underwent a bowel prep for your procedure, you may not have a normal bowel movement for a few days.  Please Note:  You might notice some irritation and congestion in your nose or some drainage.  This is from the oxygen used during your procedure.  There is no need for concern and it should clear up in a day or so.  SYMPTOMS TO REPORT IMMEDIATELY:   Following lower endoscopy (colonoscopy or flexible sigmoidoscopy):  Excessive amounts of blood in the stool  Significant tenderness or worsening of abdominal pains  Swelling of the abdomen that is new, acute  Fever of 100F or higher  For urgent or emergent issues, a gastroenterologist can be reached at any hour by calling [phone]  [phone].   DIET:  We do recommend a small meal at first, but then you may proceed to your regular diet.  Drink plenty of fluids but you should avoid alcoholic beverages for 24 hours.  MEDICATIONS: Continue present medications.  Please see handouts given to you by your recovery nurse.  ACTIVITY:  You should plan to take it easy for the rest of today and you should NOT DRIVE or use heavy machinery until tomorrow (because of the sedation medicines used during the test).    FOLLOW UP: Our staff will call the number listed on your records the next business day following your procedure to check on you and address any questions or concerns that you may have regarding the information given to you following your procedure. If we do not reach you, we will leave a message.  However, if you are feeling well and you are not experiencing any problems, there is no need to return our call.  We will assume that you have returned to your regular daily activities without incident.  If any biopsies were taken you will be contacted by phone or by letter within the next 1-3 weeks.  Please call us at [phone] if you have not heard about the biopsies in 3 weeks.   Thank you for allowing [place] to provide for your healthcare needs today.  SIGNATURES/CONFIDENTIALITY: You and/or your care partner have signed paperwork which will be entered into your electronic medical record.  These signatures attest  to the fact that that the information above on your After Visit Summary has been reviewed and is understood.  Full responsibility of the confidentiality of this discharge information lies with you and/or your care-partner. 

## 2018-05-20 NOTE — Progress Notes (Signed)
PT taken to PACU. Monitors in place. VSS. Report given to RN. 

## 2018-05-20 NOTE — Progress Notes (Signed)
Called to room to assist during endoscopic procedure.  Patient ID and intended procedure confirmed with present staff. Received instructions for my participation in the procedure from the performing physician.  

## 2018-05-20 NOTE — Op Note (Signed)
[facility] Patient Name: Andrew [name] Procedure Date: 05/20/2018 10:11 AM MRN: [id number] Endoscopist: Gatha Mayer , MD Age: 62 Referring MD:  Date of Birth: 09-02-1955 Gender: Male Account #: [id number] Procedure:                Colonoscopy Indications:              Surveillance: Personal history of adenomatous                            polyps on last colonoscopy 5 years ago Medicines:                Propofol per Anesthesia, Monitored Anesthesia Care Procedure:                Pre-Anesthesia Assessment:                           - Prior to the procedure, a History and Physical                            was performed, and patient medications and                            allergies were reviewed. The patient's tolerance of                            previous anesthesia was also reviewed. The risks                            and benefits of the procedure and the sedation                            options and risks were discussed with the patient.                            All questions were answered, and informed consent                            was obtained. Prior Anticoagulants: The patient has                            taken no previous anticoagulant or antiplatelet                            agents. ASA Grade Assessment: III - A patient with                            severe systemic disease. After reviewing the risks                            and benefits, the patient was deemed in                            satisfactory condition to undergo the procedure.  After obtaining informed consent, the colonoscope                            was passed under direct vision. Throughout the                            procedure, the patient's blood pressure, pulse, and                            oxygen saturations were monitored continuously. The                            Colonoscope was introduced through the anus and   advanced to the the cecum, identified by                            appendiceal orifice and ileocecal valve. The                            colonoscopy was performed without difficulty. The                            patient tolerated the procedure well. The quality                            of the bowel preparation was good. The bowel                            preparation used was Miralax. The ileocecal valve,                            appendiceal orifice, and rectum were photographed. Scope In: 10:17:11 AM Scope Out: 10:32:46 AM Scope Withdrawal Time: 0 hours 13 minutes 34 seconds  Total Procedure Duration: 0 hours 15 minutes 35 seconds  Findings:                 The perianal and digital rectal examinations were                            normal. Pertinent negatives include normal prostate                            (size, shape, and consistency).                           A 1 to 2 mm polyp was found in the ascending colon.                            The polyp was sessile. The polyp was removed with a                            cold biopsy forceps. Resection and retrieval were  complete. Verification of patient identification                            for the specimen was done. Estimated blood loss was                            minimal.                           Scattered diverticula were found in the entire                            colon.                           Internal hemorrhoids were found during retroflexion.                           The exam was otherwise without abnormality on                            direct and retroflexion views. Complications:            No immediate complications. Estimated Blood Loss:     Estimated blood loss was minimal. Impression:               - One 1 to 2 mm polyp in the ascending colon,                            removed with a cold biopsy forceps. Resected and                            retrieved.                            - Diverticulosis in the entire examined colon.                           - Internal hemorrhoids.                           - The examination was otherwise normal on direct                            and retroflexion views.                           - Personal history of colonic polyps. 2 small                            adenomas 2014 Recommendation:           - Patient has a contact number available for                            emergencies. The signs and symptoms of potential  delayed complications were discussed with the                            patient. Return to normal activities tomorrow.                            Written discharge instructions were provided to the                            patient.                           - Resume previous diet.                           - Continue present medications. Gatha Mayer, MD 05/20/2018 10:41:23 AM This report has been signed electronically.

## 2018-05-21 ENCOUNTER — Telehealth: Payer: Self-pay

## 2018-05-21 ENCOUNTER — Ambulatory Visit (INDEPENDENT_AMBULATORY_CARE_PROVIDER_SITE_OTHER): Payer: Self-pay

## 2018-05-21 ENCOUNTER — Telehealth (INDEPENDENT_AMBULATORY_CARE_PROVIDER_SITE_OTHER): Payer: Self-pay | Admitting: Orthopaedic Surgery

## 2018-05-21 ENCOUNTER — Ambulatory Visit (INDEPENDENT_AMBULATORY_CARE_PROVIDER_SITE_OTHER): Payer: Medicare HMO | Admitting: Orthopaedic Surgery

## 2018-05-21 ENCOUNTER — Ambulatory Visit (INDEPENDENT_AMBULATORY_CARE_PROVIDER_SITE_OTHER): Payer: Medicare HMO

## 2018-05-21 DIAGNOSIS — M1711 Unilateral primary osteoarthritis, right knee: Secondary | ICD-10-CM | POA: Insufficient documentation

## 2018-05-21 DIAGNOSIS — Z96652 Presence of left artificial knee joint: Secondary | ICD-10-CM

## 2018-05-21 HISTORY — DX: Unilateral primary osteoarthritis, right knee: M17.11

## 2018-05-21 NOTE — Telephone Encounter (Signed)
Patient called to state requesting a call back to schedule surgery asap.with Dr. Erlinda Hong  [phone]

## 2018-05-21 NOTE — Telephone Encounter (Signed)
  Follow up Call-  Call back number 05/20/2018  Post procedure Call Back phone  # [phone]  Permission to leave phone message Yes  Some recent data might be hidden     Patient questions:  Do you have a fever, pain , or abdominal swelling? No. Pain Score  0 *  Have you tolerated food without any problems? Yes.    Have you been able to return to your normal activities? Yes.    Do you have any questions about your discharge instructions: Diet   Yes.   Medications  No. Follow up visit  No.  Do you have questions or concerns about your Care? No.  Actions: * If pain score is 4 or above: No action needed, pain <4.

## 2018-05-21 NOTE — Progress Notes (Signed)
Office Visit Note   Patient: Andrew [name]           Date of Birth: [birth year]           MRN: [id number] Visit Date: 05/21/2018              Requested by: Rogue Bussing, MD No address on file PCP: Bufford Lope, DO   Assessment & Plan: Visit Diagnoses:  1. Status post total left knee replacement   2. Primary osteoarthritis of right knee     Plan: Patient is doing well 1 year status post left total knee replacement.  We will see him back on a yearly basis.  In terms of his right knee pain he has end-stage degenerative joint disease for which she would like to have a right total knee replacement for.  He understands risks and benefits and he is has done the rehab and recovery from the previous surgery.  We will schedule him as soon as possible per his request.  He is a Restaurant manager, fast food.  He did have issues with constipation with the last surgery.  Follow-Up Instructions: Return for 2 week postop visit.   Orders:  Orders Placed This Encounter  Procedures  . XR Knee Complete 4 Views Left  . XR Knee Complete 4 Views Right   No orders of the defined types were placed in this encounter.     Procedures: No procedures performed   Clinical Data: No additional findings.   Subjective: Chief Complaint  Patient presents with  . Right Knee - Follow-up, Pain  . Left Knee - Follow-up    Andrew [name] is a very pleasant 62 year old gentleman who comes in for 2 separate issues.  He is 1 year status post left total knee replacement and doing very well.  He did have some constipation after surgery but otherwise he has progressed very well he is very happy.  His other issue is his right knee arthritis which he has had significant difficulty with.  He is not interested in cortisone injections or Visco supplementation as they have not worked well for his left knee in the past.  He would like to have the right knee replaced as soon as possible.  He denies any changes in medical  history.   Review of Systems  Constitutional: Negative.   All other systems reviewed and are negative.    Objective: Vital Signs: There were no vitals taken for this visit.  Physical Exam  Constitutional: He is oriented to person, place, and time. He appears well-developed and well-nourished.  HENT:  Head: Normocephalic and atraumatic.  Eyes: Pupils are equal, round, and reactive to light.  Neck: Neck supple.  Pulmonary/Chest: Effort normal.  Abdominal: Soft.  Musculoskeletal: Normal range of motion.  Neurological: He is alert and oriented to person, place, and time.  Skin: Skin is warm.  Psychiatric: He has a normal mood and affect. His behavior is normal. Judgment and thought content normal.  Nursing note and vitals reviewed.   Ortho Exam Left knee exam shows a fully healed surgical scar with excellent range of motion.  Collaterals are stable. Right knee exam shows no joint effusion.  1+ patellofemoral crepitus.  Collaterals and cruciates are stable. Specialty Comments:  No specialty comments available.  Imaging: Xr Knee Complete 4 Views Left  Result Date: 05/21/2018 Stable total knee replacement.  Xr Knee Complete 4 Views Right  Result Date: 05/21/2018 tricompartmental DJD    PMFS History: Patient Active Problem List  Diagnosis Date Noted  . Primary osteoarthritis of right knee 05/21/2018  . Chronic left shoulder pain 09/20/2017  . Total knee replacement status 05/15/2017  . Chronic gout of multiple sites 05/02/2017  . Healthcare maintenance 04/04/2016  . Osteoarthritis of both knees 12/26/2015  . History of stroke 09/02/2014  . Hypothyroidism 05/25/2013  . Personal history of colonic adenomas 04/01/2013  . Hyperlipidemia associated with type 2 diabetes mellitus ([facility]) 02/20/2007  . Controlled diabetes mellitus ([place]) 10/18/2006  . ERECTILE DYSFUNCTION 10/18/2006  . 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 Essential hypertension  10/18/2006  . CKD (chronic kidney disease) stage 3, GFR 30-59 ml/min ([facility]) 09/05/2006   Past Medical History:  Diagnosis Date  . Arthritis   . CRD (chronic renal disease), stage II    Baseline Cr 1.2  . CVA (cerebral vascular accident) ([place])    x4 last one in 2007, R sided residual weakness  . HAMMER TOE 07/25/2010  . HLD (hyperlipidemia)   . HTN (hypertension)   . Hypothyroidism   . Leg swelling   . Lipoma of back 03/18/2011  . Personal history of colonic adenomas 04/01/2013  . Prostatitis    hx of x2  . PSEUDOFOLLICULITIS BARBAE 11/08/9765   Qualifier: Diagnosis of  By: Danise Mina  MD, Garlon Hatchet    . Sarcoidosis   . T2DM (type 2 diabetes mellitus) ([facility])     Family History  Problem Relation Age of Onset  . Bone cancer Father   . Cancer Father 20       bone  . Uterine cancer Mother   . Cancer Mother   . Heart attack Brother   . Kidney disease Brother   . Diabetes Brother   . Arthritis Brother   . Heart disease Sister   . Congestive Heart Failure Sister   . Congestive Heart Failure Sister   . Congestive Heart Failure Sister   . Cancer Sister   . Diabetes Sister   . Arthritis Sister   . Breast cancer Sister   . Colon cancer Neg Hx   . Rectal cancer Neg Hx   . Stomach cancer Neg Hx   . Colon polyps Neg Hx   . Esophageal cancer Neg Hx     Past Surgical History:  Procedure Laterality Date  . COLONOSCOPY  03/24/2013  . COLONOSCOPY W/ POLYPECTOMY    . ERCP  2006   Normal  . [place]   x2 'inguinal  . LIPOMA EXCISION  06/22/2011   Procedure: EXCISION LIPOMA;  Surgeon: Rolm Bookbinder, MD;  Location: [facility];  Service: General;  Laterality: N/A;  . TOTAL KNEE ARTHROPLASTY Left 05/15/2017   Procedure: LEFT TOTAL KNEE ARTHROPLASTY;  Surgeon: Leandrew Koyanagi, MD;  Location: [facility];  Service: Orthopedics;  Laterality: Left;  . TRANSTHORACIC ECHOCARDIOGRAM  2005   EF 60%   Social History   Occupational History  . 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 Smoking status: Never Smoker  . Smokeless tobacco: Never Used  . Tobacco comment: Never smoker; no plans to start  Substance and Sexual Activity  . Alcohol use: No  . Drug use: No  . Sexual activity: Not Currently

## 2018-05-23 NOTE — Telephone Encounter (Signed)
I called patient and scheduled surgery. 

## 2018-05-28 ENCOUNTER — Encounter: Payer: Self-pay | Admitting: Internal Medicine

## 2018-05-28 DIAGNOSIS — Z86010 Personal history of colonic polyps: Secondary | ICD-10-CM

## 2018-05-28 NOTE — Pre-Procedure Instructions (Signed)
Andrew [name]  05/28/2018      [facility] [address] [place] [address] Phone: [phone]   [id number] [address], [place] [address] Phone: [phone] Fax: [phone]  [facility] [address] [place], [place] [address] Phone: [phone] Fax: [phone]    Your procedure is scheduled on Monday December 2nd.  Report to [facility] at 0800 A.M.  Call this number if you have problems the morning of surgery:  [phone]   Remember:  Do not eat or drink after midnight.    Take these medicines the morning of surgery with A SIP OF WATER   acetaminophen (TYLENOL) if needed  allopurinol (ZYLOPRIM)  amLODipine (NORVASC)  docusate sodium (COLACE)  levothyroxine (SYNTHROID, LEVOTHROID)  tiZANidine (ZANAFLEX) if needed   7 days prior to surgery STOP taking any Aspirin(unless otherwise instructed by your surgeon), Aleve, Naproxen, Ibuprofen, Motrin, Advil, Goody's, BC's, all herbal medications, fish oil, and all vitamins   WHAT DO I DO ABOUT MY DIABETES MEDICATION?   Marland Kitchen Do not take oral diabetes medicines (pills) the morning of surgery: metFORMIN (GLUCOPHAGE).  . THE NIGHT BEFORE SURGERY, take Usual dose of Victoza      . The day of surgery, do not take other diabetes injectables, including Byetta (exenatide), Bydureon (exenatide ER), Victoza (liraglutide), or Trulicity (dulaglutide).  . If your CBG is greater than 220 mg/dL, you may take  of your sliding scale (correction) dose of insulin.   How to Manage Your Diabetes Before and After Surgery  Why is it important to control my blood sugar before and after surgery? . Improving blood sugar levels before and after surgery helps healing and can limit problems. . A way of improving blood sugar control is eating a healthy diet by: o  Eating less sugar and  carbohydrates o  Increasing activity/exercise o  Talking with your doctor about reaching your blood sugar goals . High blood sugars (greater than 180 mg/dL) can raise your risk of infections and slow your recovery, so you will need to focus on controlling your diabetes during the weeks before surgery. . Make sure that the doctor who takes care of your diabetes knows about your planned surgery including the date and location.  How do I manage my blood sugar before surgery? . Check your blood sugar at least 4 times a day, starting 2 days before surgery, to make sure that the level is not too high or low. o Check your blood sugar the morning of your surgery when you wake up and every 2 hours until you get to the Short Stay unit. . If your blood sugar is less than 70 mg/dL, you will need to treat for low blood sugar: o Do not take insulin. o Treat a low blood sugar (less than 70 mg/dL) with  cup of clear juice (cranberry or apple), 4 glucose tablets, OR glucose gel. o Recheck blood sugar in 15 minutes after treatment (to make sure it is greater than 70 mg/dL). If your blood sugar is not greater than 70 mg/dL on recheck, call [phone] for further instructions. . Report your blood sugar to the short stay nurse when you get to Short Stay.  . If you are admitted to the hospital after surgery: o Your blood sugar will be checked by the staff and you will probably be given insulin after surgery (  instead of oral diabetes medicines) to make sure you have good blood sugar levels. o The goal for blood sugar control after surgery is 80-180 mg/dL.    Do not wear jewelry.  Do not wear lotions, powders, or colognes, or deodorant.  Men may shave face and neck.  Do not bring valuables to the hospital.  [facility] is not responsible for any belongings or valuables.  Contacts, dentures or bridgework may not be worn into surgery.  Leave your suitcase in the car.  After surgery it may be brought to your  room.  For patients admitted to the hospital, discharge time will be determined by your treatment team.  Patients discharged the day of surgery will not be allowed to drive home.    [facility]- Preparing For Surgery  Before surgery, you can play an important role. Because skin is not sterile, your skin needs to be as free of germs as possible. You can reduce the number of germs on your skin by washing with CHG (chlorahexidine gluconate) Soap before surgery.  CHG is an antiseptic cleaner which kills germs and bonds with the skin to continue killing germs even after washing.    Oral Hygiene is also important to reduce your risk of infection.  Remember - BRUSH YOUR TEETH THE MORNING OF SURGERY WITH YOUR REGULAR TOOTHPASTE  Please do not use if you have an allergy to CHG or antibacterial soaps. If your skin becomes reddened/irritated stop using the CHG.  Do not shave (including legs and underarms) for at least 48 hours prior to first CHG shower. It is OK to shave your face.  Please follow these instructions carefully.   1. Shower the NIGHT BEFORE SURGERY and the MORNING OF SURGERY with CHG.   2. If you chose to wash your hair, wash your hair first as usual with your normal shampoo.  3. After you shampoo, rinse your hair and body thoroughly to remove the shampoo.  4. Use CHG as you would any other liquid soap. You can apply CHG directly to the skin and wash gently with a scrungie or a clean washcloth.   5. Apply the CHG Soap to your body ONLY FROM THE NECK DOWN.  Do not use on open wounds or open sores. Avoid contact with your eyes, ears, mouth and genitals (private parts). Wash Face and genitals (private parts)  with your normal soap.  6. Wash thoroughly, paying special attention to the area where your surgery will be performed.  7. Thoroughly rinse your body with warm water from the neck down.  8. DO NOT shower/wash with your normal soap after using and rinsing off the CHG  Soap.  9. Pat yourself dry with a CLEAN TOWEL.  10. Wear CLEAN PAJAMAS to bed the night before surgery, wear comfortable clothes the morning of surgery  11. Place CLEAN SHEETS on your bed the night of your first shower and DO NOT SLEEP WITH PETS.    Day of Surgery:  Do not apply any deodorants/lotions.  Please wear clean clothes to the hospital/surgery center.   Remember to brush your teeth WITH YOUR REGULAR TOOTHPASTE.    Please read over the following fact sheets that you were given.

## 2018-05-28 NOTE — Progress Notes (Signed)
Not precancerous polyp Recall 2029 Only 2 sub cm adenomas 2014

## 2018-05-29 ENCOUNTER — Encounter (HOSPITAL_COMMUNITY)
Admission: RE | Admit: 2018-05-29 | Discharge: 2018-05-29 | Disposition: A | Discharge status: Home / Self Care | Payer: Medicare HMO | Source: Ambulatory Visit | Attending: Physician Assistant | Admitting: Physician Assistant

## 2018-05-29 ENCOUNTER — Encounter (HOSPITAL_COMMUNITY): Payer: Self-pay

## 2018-05-29 ENCOUNTER — Other Ambulatory Visit: Payer: Self-pay

## 2018-05-29 ENCOUNTER — Encounter (HOSPITAL_COMMUNITY)
Admission: RE | Admit: 2018-05-29 | Discharge: 2018-05-29 | Disposition: A | Discharge status: Home / Self Care | Payer: Medicare HMO | Source: Ambulatory Visit | Attending: Orthopaedic Surgery | Admitting: Orthopaedic Surgery

## 2018-05-29 DIAGNOSIS — Z01818 Encounter for other preprocedural examination: Secondary | ICD-10-CM | POA: Insufficient documentation

## 2018-05-29 DIAGNOSIS — M1711 Unilateral primary osteoarthritis, right knee: Secondary | ICD-10-CM | POA: Insufficient documentation

## 2018-05-29 LAB — COMPREHENSIVE METABOLIC PANEL
ALK PHOS: 72 U/L (ref 38–126)
ALT: 18 U/L (ref 0–44)
ANION GAP: 6 (ref 5–15)
AST: 20 U/L (ref 15–41)
Albumin: 4 g/dL (ref 3.5–5.0)
BUN: 9 mg/dL (ref 8–23)
CALCIUM: 9.4 mg/dL (ref 8.9–10.3)
CHLORIDE: 102 mmol/L (ref 98–111)
CO2: 26 mmol/L (ref 22–32)
Creatinine, Ser: 1.19 mg/dL (ref 0.61–1.24)
GFR calc non Af Amer: >60 mL/min (ref >60)
Glucose, Bld: 181 mg/dL — ABNORMAL HIGH (ref 70–99)
Potassium: 4 mmol/L (ref 3.5–5.1)
SODIUM: 134 mmol/L — AB (ref 135–145)
Total Bilirubin: 0.3 mg/dL (ref 0.3–1.2)
Total Protein: 7.7 g/dL (ref 6.5–8.1)

## 2018-05-29 LAB — APTT: APTT: 28 s (ref 24–36)

## 2018-05-29 LAB — CBC WITH DIFFERENTIAL/PLATELET
Abs Immature Granulocytes: 0.02 10*3/uL (ref 0.00–0.07)
BASOS PCT: 1 %
Basophils Absolute: 0.1 10*3/uL (ref 0.0–0.1)
EOS ABS: 0.4 10*3/uL (ref 0.0–0.5)
Eosinophils Relative: 8 %
HCT: 45.8 % (ref 39.0–52.0)
Hemoglobin: 14.6 g/dL (ref 13.0–17.0)
Immature Granulocytes: 0 %
Lymphocytes Relative: 33 %
Lymphs Abs: 1.7 10*3/uL (ref 0.7–4.0)
MCH: 29.8 pg (ref 26.0–34.0)
MCHC: 31.9 g/dL (ref 30.0–36.0)
MCV: 93.5 fL (ref 80.0–100.0)
MONO ABS: 0.4 10*3/uL (ref 0.1–1.0)
Monocytes Relative: 9 %
Neutro Abs: 2.5 10*3/uL (ref 1.7–7.7)
Neutrophils Relative %: 49 %
PLATELETS: 214 10*3/uL (ref 150–400)
RBC: 4.9 MIL/uL (ref 4.22–5.81)
RDW: 12.9 % (ref 11.5–15.5)
WBC: 5.1 10*3/uL (ref 4.0–10.5)
nRBC: 0 % (ref 0.0–0.2)

## 2018-05-29 LAB — PROTIME-INR
INR: 1.01
Prothrombin Time: 13.2 seconds (ref 11.4–15.2)

## 2018-05-29 LAB — NO BLOOD PRODUCTS

## 2018-05-29 LAB — SURGICAL PCR SCREEN
MRSA, PCR: NEGATIVE
Staphylococcus aureus: NEGATIVE

## 2018-05-29 LAB — GLUCOSE, CAPILLARY: GLUCOSE-CAPILLARY: 162 mg/dL — AB (ref 70–99)

## 2018-05-29 NOTE — Progress Notes (Signed)
PCP - Orson Eva MD  Chest x-ray - 05/29/18 EKG - 01/24/18  Fasting Blood Sugar - 110 Checks Blood Sugar 2 times a day  Blood Thinner Instructions: N/A Aspirin Instructions: Last dose will be Monday Nov 25th  Anesthesia review: yes, EKG review  Patient denies shortness of breath, fever, cough and chest pain at PAT appointment   Patient verbalized understanding of instructions that were given to them at the PAT appointment. Patient was also instructed that they will need to review over the PAT instructions again at home before surgery.

## 2018-05-30 ENCOUNTER — Other Ambulatory Visit: Payer: Self-pay | Admitting: *Deleted

## 2018-05-30 ENCOUNTER — Telehealth: Payer: Self-pay | Admitting: *Deleted

## 2018-05-30 DIAGNOSIS — E1122 Type 2 diabetes mellitus with diabetic chronic kidney disease: Secondary | ICD-10-CM

## 2018-05-30 DIAGNOSIS — N182 Chronic kidney disease, stage 2 (mild): Principal | ICD-10-CM

## 2018-05-30 MED ORDER — METFORMIN HCL 1000 MG PO TABS: 1000.0000 mg | ORAL_TABLET | Freq: Two times a day (BID) | ORAL | 3 refills | Status: DC

## 2018-05-30 NOTE — Telephone Encounter (Signed)
Pt wants to let MD know that he has his colonoscopy, she should be receiving the results.  Also he is having a knee replacement on 06/09/18. Raney Koeppen, Salome Spotted, CMA

## 2018-05-30 NOTE — Telephone Encounter (Signed)
Acknowledged. Results are viewable under labs

## 2018-06-06 MED ORDER — TRANEXAMIC ACID 1000 MG/10ML IV SOLN
2000.0000 mg | INTRAVENOUS | Status: AC
  Administered 2018-06-09: 2000 mg via TOPICAL
  Filled 2018-06-06: qty 20

## 2018-06-06 MED ORDER — TRANEXAMIC ACID-NACL 1000-0.7 MG/100ML-% IV SOLN
1000.0000 mg | INTRAVENOUS | Status: AC
  Administered 2018-06-09: 1000 mg via INTRAVENOUS
  Filled 2018-06-06: qty 100

## 2018-06-09 ENCOUNTER — Encounter (HOSPITAL_COMMUNITY)
Admission: RE | Disposition: A | Discharge status: Home / Self Care | Payer: Self-pay | Source: Ambulatory Visit | Attending: Orthopaedic Surgery

## 2018-06-09 ENCOUNTER — Ambulatory Visit (HOSPITAL_COMMUNITY): Payer: Medicare HMO | Admitting: Anesthesiology

## 2018-06-09 ENCOUNTER — Other Ambulatory Visit: Payer: Self-pay

## 2018-06-09 ENCOUNTER — Observation Stay (HOSPITAL_COMMUNITY)
Admission: RE | Admit: 2018-06-09 | Discharge: 2018-06-10 | Disposition: A | Discharge status: Home / Self Care | Payer: Medicare HMO | Source: Ambulatory Visit | Attending: Orthopaedic Surgery | Admitting: Orthopaedic Surgery

## 2018-06-09 ENCOUNTER — Observation Stay (HOSPITAL_COMMUNITY): Payer: Medicare HMO

## 2018-06-09 ENCOUNTER — Ambulatory Visit (HOSPITAL_COMMUNITY): Payer: Medicare HMO | Admitting: Physician Assistant

## 2018-06-09 ENCOUNTER — Encounter (HOSPITAL_COMMUNITY): Payer: Self-pay | Admitting: Surgery

## 2018-06-09 DIAGNOSIS — Z79899 Other long term (current) drug therapy: Secondary | ICD-10-CM | POA: Diagnosis not present

## 2018-06-09 DIAGNOSIS — Z7982 Long term (current) use of aspirin: Secondary | ICD-10-CM | POA: Diagnosis not present

## 2018-06-09 DIAGNOSIS — Z8673 Personal history of transient ischemic attack (TIA), and cerebral infarction without residual deficits: Secondary | ICD-10-CM | POA: Diagnosis not present

## 2018-06-09 DIAGNOSIS — E1122 Type 2 diabetes mellitus with diabetic chronic kidney disease: Secondary | ICD-10-CM | POA: Insufficient documentation

## 2018-06-09 DIAGNOSIS — N182 Chronic kidney disease, stage 2 (mild): Secondary | ICD-10-CM | POA: Diagnosis not present

## 2018-06-09 DIAGNOSIS — Z96651 Presence of right artificial knee joint: Secondary | ICD-10-CM | POA: Diagnosis not present

## 2018-06-09 DIAGNOSIS — E039 Hypothyroidism, unspecified: Secondary | ICD-10-CM | POA: Insufficient documentation

## 2018-06-09 DIAGNOSIS — I10 Essential (primary) hypertension: Secondary | ICD-10-CM | POA: Diagnosis not present

## 2018-06-09 DIAGNOSIS — Z96659 Presence of unspecified artificial knee joint: Secondary | ICD-10-CM

## 2018-06-09 DIAGNOSIS — Z471 Aftercare following joint replacement surgery: Secondary | ICD-10-CM | POA: Diagnosis not present

## 2018-06-09 DIAGNOSIS — M1711 Unilateral primary osteoarthritis, right knee: Principal | ICD-10-CM | POA: Insufficient documentation

## 2018-06-09 DIAGNOSIS — I129 Hypertensive chronic kidney disease with stage 1 through stage 4 chronic kidney disease, or unspecified chronic kidney disease: Secondary | ICD-10-CM | POA: Insufficient documentation

## 2018-06-09 DIAGNOSIS — Z79890 Hormone replacement therapy (postmenopausal): Secondary | ICD-10-CM | POA: Diagnosis not present

## 2018-06-09 DIAGNOSIS — D62 Acute posthemorrhagic anemia: Secondary | ICD-10-CM | POA: Insufficient documentation

## 2018-06-09 DIAGNOSIS — Z96652 Presence of left artificial knee joint: Secondary | ICD-10-CM | POA: Diagnosis not present

## 2018-06-09 DIAGNOSIS — G8918 Other acute postprocedural pain: Secondary | ICD-10-CM | POA: Diagnosis not present

## 2018-06-09 HISTORY — PX: TOTAL KNEE ARTHROPLASTY: SHX125

## 2018-06-09 LAB — GLUCOSE, CAPILLARY
Glucose-Capillary: 129 mg/dL — ABNORMAL HIGH (ref 70–99)
Glucose-Capillary: 109 mg/dL — ABNORMAL HIGH (ref 70–99)
Glucose-Capillary: 150 mg/dL — ABNORMAL HIGH (ref 70–99)
Glucose-Capillary: 254 mg/dL — ABNORMAL HIGH (ref 70–99)

## 2018-06-09 SURGERY — ARTHROPLASTY, KNEE, TOTAL
Anesthesia: Spinal | Site: Knee | Laterality: Right

## 2018-06-09 MED ORDER — 0.9 % SODIUM CHLORIDE (POUR BTL) OPTIME
TOPICAL | Status: DC | PRN
  Administered 2018-06-09: 1000 mL

## 2018-06-09 MED ORDER — ASPIRIN 81 MG PO CHEW
81.0000 mg | CHEWABLE_TABLET | Freq: Two times a day (BID) | ORAL | Status: DC
  Administered 2018-06-09 – 2018-06-10 (×2): 81 mg via ORAL
  Filled 2018-06-09 (×2): qty 1

## 2018-06-09 MED ORDER — KETOROLAC TROMETHAMINE 15 MG/ML IJ SOLN
INTRAMUSCULAR | Status: AC
  Filled 2018-06-09: qty 1

## 2018-06-09 MED ORDER — KETOROLAC TROMETHAMINE 15 MG/ML IJ SOLN
30.0000 mg | Freq: Four times a day (QID) | INTRAMUSCULAR | Status: DC
  Administered 2018-06-09 – 2018-06-10 (×3): 30 mg via INTRAVENOUS
  Filled 2018-06-09 (×3): qty 2

## 2018-06-09 MED ORDER — PROMETHAZINE HCL 25 MG/ML IJ SOLN: 6.2500 mg | INTRAMUSCULAR | Status: DC | PRN

## 2018-06-09 MED ORDER — FENTANYL CITRATE (PF) 100 MCG/2ML IJ SOLN
INTRAMUSCULAR | Status: DC | PRN
  Administered 2018-06-09 (×3): 50 ug via INTRAVENOUS

## 2018-06-09 MED ORDER — ROPIVACAINE HCL 5 MG/ML IJ SOLN
INTRAMUSCULAR | Status: DC | PRN
  Administered 2018-06-09 (×2): 5 mL via PERINEURAL

## 2018-06-09 MED ORDER — SUGAMMADEX SODIUM 200 MG/2ML IV SOLN
INTRAVENOUS | Status: DC | PRN
  Administered 2018-06-09: 160 mg via INTRAVENOUS

## 2018-06-09 MED ORDER — DEXAMETHASONE SODIUM PHOSPHATE 10 MG/ML IJ SOLN
INTRAMUSCULAR | Status: AC
  Filled 2018-06-09: qty 1

## 2018-06-09 MED ORDER — ACETAMINOPHEN 325 MG PO TABS: 325.0000 mg | ORAL_TABLET | Freq: Four times a day (QID) | ORAL | Status: DC | PRN

## 2018-06-09 MED ORDER — METOCLOPRAMIDE HCL 5 MG PO TABS: 5.0000 mg | ORAL_TABLET | Freq: Three times a day (TID) | ORAL | Status: DC | PRN

## 2018-06-09 MED ORDER — OXYCODONE HCL 5 MG PO TABS: 5.0000 mg | ORAL_TABLET | ORAL | 0 refills | Status: DC | PRN

## 2018-06-09 MED ORDER — CLINDAMYCIN PHOSPHATE 600 MG/50ML IV SOLN
600.0000 mg | Freq: Four times a day (QID) | INTRAVENOUS | Status: AC
  Administered 2018-06-09 – 2018-06-10 (×2): 600 mg via INTRAVENOUS
  Filled 2018-06-09 (×2): qty 50

## 2018-06-09 MED ORDER — MIDAZOLAM HCL 2 MG/2ML IJ SOLN: 2.0000 mg | Freq: Once | INTRAMUSCULAR | Status: DC

## 2018-06-09 MED ORDER — FENTANYL CITRATE (PF) 250 MCG/5ML IJ SOLN
INTRAMUSCULAR | Status: AC
  Filled 2018-06-09: qty 5

## 2018-06-09 MED ORDER — SODIUM CHLORIDE 0.9 % IR SOLN
Status: DC | PRN
  Administered 2018-06-09: 3000 mL

## 2018-06-09 MED ORDER — HYDROMORPHONE HCL 1 MG/ML IJ SOLN: 0.5000 mg | INTRAMUSCULAR | Status: DC | PRN

## 2018-06-09 MED ORDER — DIPHENHYDRAMINE HCL 12.5 MG/5ML PO ELIX: 25.0000 mg | ORAL_SOLUTION | ORAL | Status: DC | PRN

## 2018-06-09 MED ORDER — FENTANYL CITRATE (PF) 100 MCG/2ML IJ SOLN
INTRAMUSCULAR | Status: AC
  Filled 2018-06-09: qty 2

## 2018-06-09 MED ORDER — ONDANSETRON HCL 4 MG PO TABS: 4.0000 mg | ORAL_TABLET | Freq: Four times a day (QID) | ORAL | Status: DC | PRN

## 2018-06-09 MED ORDER — DEXAMETHASONE SODIUM PHOSPHATE 10 MG/ML IJ SOLN
10.0000 mg | Freq: Once | INTRAMUSCULAR | Status: AC
  Administered 2018-06-10: 10 mg via INTRAVENOUS
  Filled 2018-06-09: qty 1

## 2018-06-09 MED ORDER — ONDANSETRON HCL 4 MG/2ML IJ SOLN: 4.0000 mg | Freq: Four times a day (QID) | INTRAMUSCULAR | Status: DC | PRN

## 2018-06-09 MED ORDER — ROCURONIUM BROMIDE 10 MG/ML (PF) SYRINGE
PREFILLED_SYRINGE | INTRAVENOUS | Status: DC | PRN
  Administered 2018-06-09: 10 mg via INTRAVENOUS
  Administered 2018-06-09: 40 mg via INTRAVENOUS
  Administered 2018-06-09: 20 mg via INTRAVENOUS

## 2018-06-09 MED ORDER — KETOROLAC TROMETHAMINE 30 MG/ML IJ SOLN: 15.0000 mg | Freq: Once | INTRAMUSCULAR | Status: DC | PRN

## 2018-06-09 MED ORDER — INSULIN ASPART 100 UNIT/ML ~~LOC~~ SOLN
0.0000 [IU] | Freq: Three times a day (TID) | SUBCUTANEOUS | Status: DC
  Administered 2018-06-10 (×2): 3 [IU] via SUBCUTANEOUS

## 2018-06-09 MED ORDER — METHOCARBAMOL 500 MG PO TABS: 500.0000 mg | ORAL_TABLET | Freq: Four times a day (QID) | ORAL | Status: DC | PRN

## 2018-06-09 MED ORDER — SENNOSIDES-DOCUSATE SODIUM 8.6-50 MG PO TABS: 1.0000 | ORAL_TABLET | Freq: Every evening | ORAL | 1 refills | Status: DC | PRN

## 2018-06-09 MED ORDER — OXYCODONE HCL ER 10 MG PO T12A: 10.0000 mg | EXTENDED_RELEASE_TABLET | Freq: Two times a day (BID) | ORAL | 0 refills | Status: AC

## 2018-06-09 MED ORDER — MEPERIDINE HCL 50 MG/ML IJ SOLN: 6.2500 mg | INTRAMUSCULAR | Status: DC | PRN

## 2018-06-09 MED ORDER — SODIUM CHLORIDE 0.9% FLUSH
INTRAVENOUS | Status: DC | PRN
  Administered 2018-06-09: 10 mL

## 2018-06-09 MED ORDER — HYDROMORPHONE HCL 1 MG/ML IJ SOLN
INTRAMUSCULAR | Status: AC
  Filled 2018-06-09: qty 1

## 2018-06-09 MED ORDER — OXYCODONE HCL 5 MG PO TABS
10.0000 mg | ORAL_TABLET | ORAL | Status: DC | PRN
  Administered 2018-06-09: 15 mg via ORAL

## 2018-06-09 MED ORDER — LIDOCAINE 2% (20 MG/ML) 5 ML SYRINGE
INTRAMUSCULAR | Status: AC
  Filled 2018-06-09: qty 5

## 2018-06-09 MED ORDER — LIDOCAINE 2% (20 MG/ML) 5 ML SYRINGE
INTRAMUSCULAR | Status: DC | PRN
  Administered 2018-06-09: 100 mg via INTRAVENOUS

## 2018-06-09 MED ORDER — PROMETHAZINE HCL 25 MG PO TABS: 25.0000 mg | ORAL_TABLET | Freq: Four times a day (QID) | ORAL | 1 refills | Status: DC | PRN

## 2018-06-09 MED ORDER — MIDAZOLAM HCL 2 MG/2ML IJ SOLN
2.0000 mg | Freq: Once | INTRAMUSCULAR | Status: AC
  Administered 2018-06-09: 2 mg via INTRAVENOUS
  Filled 2018-06-09: qty 2

## 2018-06-09 MED ORDER — TRANEXAMIC ACID-NACL 1000-0.7 MG/100ML-% IV SOLN
1000.0000 mg | Freq: Once | INTRAVENOUS | Status: AC
  Administered 2018-06-09: 1000 mg via INTRAVENOUS
  Filled 2018-06-09: qty 100

## 2018-06-09 MED ORDER — CHLORHEXIDINE GLUCONATE 4 % EX LIQD: 60.0000 mL | Freq: Once | CUTANEOUS | Status: DC

## 2018-06-09 MED ORDER — KETOROLAC TROMETHAMINE 30 MG/ML IJ SOLN: 30.0000 mg | Freq: Once | INTRAMUSCULAR | Status: DC | PRN

## 2018-06-09 MED ORDER — MIDAZOLAM HCL 2 MG/2ML IJ SOLN
INTRAMUSCULAR | Status: AC
  Filled 2018-06-09: qty 2

## 2018-06-09 MED ORDER — FENTANYL CITRATE (PF) 250 MCG/5ML IJ SOLN
INTRAMUSCULAR | Status: DC | PRN
  Administered 2018-06-09 (×2): 50 ug via INTRAVENOUS

## 2018-06-09 MED ORDER — AMLODIPINE BESYLATE 5 MG PO TABS
5.0000 mg | ORAL_TABLET | Freq: Every day | ORAL | Status: DC
  Administered 2018-06-10: 5 mg via ORAL
  Filled 2018-06-09: qty 1

## 2018-06-09 MED ORDER — SORBITOL 70 % SOLN: 30.0000 mL | Freq: Every day | Status: DC | PRN

## 2018-06-09 MED ORDER — METHOCARBAMOL 750 MG PO TABS: 750.0000 mg | ORAL_TABLET | Freq: Two times a day (BID) | ORAL | 0 refills | Status: DC | PRN

## 2018-06-09 MED ORDER — ENALAPRIL MALEATE 5 MG PO TABS
40.0000 mg | ORAL_TABLET | Freq: Every day | ORAL | Status: DC
  Administered 2018-06-10: 10 mg via ORAL
  Filled 2018-06-09: qty 8

## 2018-06-09 MED ORDER — EPHEDRINE 5 MG/ML INJ
INTRAVENOUS | Status: AC
  Filled 2018-06-09: qty 20

## 2018-06-09 MED ORDER — MENTHOL 3 MG MT LOZG: 1.0000 | LOZENGE | OROMUCOSAL | Status: DC | PRN

## 2018-06-09 MED ORDER — INSULIN ASPART 100 UNIT/ML ~~LOC~~ SOLN
0.0000 [IU] | Freq: Every day | SUBCUTANEOUS | Status: DC
  Administered 2018-06-09: 3 [IU] via SUBCUTANEOUS

## 2018-06-09 MED ORDER — LACTATED RINGERS IV SOLN
INTRAVENOUS | Status: DC
  Administered 2018-06-09 (×2): via INTRAVENOUS

## 2018-06-09 MED ORDER — FENTANYL CITRATE (PF) 100 MCG/2ML IJ SOLN: 100.0000 ug | Freq: Once | INTRAMUSCULAR | Status: DC

## 2018-06-09 MED ORDER — METHOCARBAMOL 1000 MG/10ML IJ SOLN
500.0000 mg | Freq: Four times a day (QID) | INTRAVENOUS | Status: DC | PRN
  Filled 2018-06-09: qty 5

## 2018-06-09 MED ORDER — VANCOMYCIN HCL 1000 MG IV SOLR
INTRAVENOUS | Status: DC | PRN
  Administered 2018-06-09: 1000 mg via TOPICAL

## 2018-06-09 MED ORDER — CELECOXIB 200 MG PO CAPS
200.0000 mg | ORAL_CAPSULE | Freq: Two times a day (BID) | ORAL | Status: DC
  Administered 2018-06-09 – 2018-06-10 (×2): 200 mg via ORAL
  Filled 2018-06-09 (×2): qty 1

## 2018-06-09 MED ORDER — MAGNESIUM CITRATE PO SOLN: 1.0000 | Freq: Once | ORAL | Status: DC | PRN

## 2018-06-09 MED ORDER — ROCURONIUM BROMIDE 50 MG/5ML IV SOSY
PREFILLED_SYRINGE | INTRAVENOUS | Status: AC
  Filled 2018-06-09: qty 10

## 2018-06-09 MED ORDER — NALOXONE HCL 0.4 MG/ML IJ SOLN
INTRAMUSCULAR | Status: AC
  Filled 2018-06-09: qty 1

## 2018-06-09 MED ORDER — ROPIVACAINE HCL 7.5 MG/ML IJ SOLN
INTRAMUSCULAR | Status: DC | PRN
  Administered 2018-06-09 (×4): 5 mL via PERINEURAL

## 2018-06-09 MED ORDER — ROCURONIUM BROMIDE 50 MG/5ML IV SOSY
PREFILLED_SYRINGE | INTRAVENOUS | Status: AC
  Filled 2018-06-09: qty 5

## 2018-06-09 MED ORDER — DOCUSATE SODIUM 100 MG PO CAPS
100.0000 mg | ORAL_CAPSULE | Freq: Two times a day (BID) | ORAL | Status: DC
  Administered 2018-06-09 – 2018-06-10 (×2): 100 mg via ORAL
  Filled 2018-06-09 (×2): qty 1

## 2018-06-09 MED ORDER — CEFAZOLIN SODIUM-DEXTROSE 2-4 GM/100ML-% IV SOLN
2.0000 g | INTRAVENOUS | Status: AC
  Administered 2018-06-09: 2 g via INTRAVENOUS
  Filled 2018-06-09: qty 100

## 2018-06-09 MED ORDER — ALLOPURINOL 100 MG PO TABS
200.0000 mg | ORAL_TABLET | Freq: Every day | ORAL | Status: DC
  Administered 2018-06-10: 200 mg via ORAL
  Filled 2018-06-09: qty 2

## 2018-06-09 MED ORDER — ALUM & MAG HYDROXIDE-SIMETH 200-200-20 MG/5ML PO SUSP: 30.0000 mL | ORAL | Status: DC | PRN

## 2018-06-09 MED ORDER — SODIUM CHLORIDE 0.9 % IV SOLN
INTRAVENOUS | Status: DC
  Administered 2018-06-09: 22:00:00 via INTRAVENOUS

## 2018-06-09 MED ORDER — ONDANSETRON HCL 4 MG/2ML IJ SOLN
INTRAMUSCULAR | Status: AC
  Filled 2018-06-09: qty 2

## 2018-06-09 MED ORDER — PROPOFOL 10 MG/ML IV BOLUS
INTRAVENOUS | Status: DC | PRN
  Administered 2018-06-09: 150 mg via INTRAVENOUS

## 2018-06-09 MED ORDER — ACETAMINOPHEN 500 MG PO TABS
1000.0000 mg | ORAL_TABLET | Freq: Four times a day (QID) | ORAL | Status: AC
  Administered 2018-06-09 – 2018-06-10 (×4): 1000 mg via ORAL
  Filled 2018-06-09 (×4): qty 2

## 2018-06-09 MED ORDER — KETOROLAC TROMETHAMINE 15 MG/ML IJ SOLN
15.0000 mg | Freq: Once | INTRAMUSCULAR | Status: AC
  Administered 2018-06-09: 15 mg via INTRAVENOUS

## 2018-06-09 MED ORDER — HYDROMORPHONE HCL 1 MG/ML IJ SOLN
0.2500 mg | INTRAMUSCULAR | Status: DC | PRN
  Administered 2018-06-09 (×4): 0.5 mg via INTRAVENOUS

## 2018-06-09 MED ORDER — OXYCODONE HCL ER 10 MG PO T12A
10.0000 mg | EXTENDED_RELEASE_TABLET | Freq: Two times a day (BID) | ORAL | Status: DC
  Administered 2018-06-09 – 2018-06-10 (×2): 10 mg via ORAL
  Filled 2018-06-09 (×2): qty 1

## 2018-06-09 MED ORDER — KETOROLAC TROMETHAMINE 15 MG/ML IJ SOLN
INTRAMUSCULAR | Status: AC
  Administered 2018-06-09: 15 mg via INTRAVENOUS
  Filled 2018-06-09: qty 1

## 2018-06-09 MED ORDER — VANCOMYCIN HCL 1000 MG IV SOLR
INTRAVENOUS | Status: AC
  Filled 2018-06-09: qty 1000

## 2018-06-09 MED ORDER — ONDANSETRON HCL 4 MG PO TABS: 4.0000 mg | ORAL_TABLET | Freq: Three times a day (TID) | ORAL | 0 refills | Status: DC | PRN

## 2018-06-09 MED ORDER — SULFAMETHOXAZOLE-TRIMETHOPRIM 800-160 MG PO TABS: 1.0000 | ORAL_TABLET | Freq: Two times a day (BID) | ORAL | 0 refills | Status: DC

## 2018-06-09 MED ORDER — ASPIRIN EC 81 MG PO TBEC: 81.0000 mg | DELAYED_RELEASE_TABLET | Freq: Two times a day (BID) | ORAL | 0 refills | Status: DC

## 2018-06-09 MED ORDER — DEXAMETHASONE SODIUM PHOSPHATE 10 MG/ML IJ SOLN
INTRAMUSCULAR | Status: DC | PRN
  Administered 2018-06-09: 10 mg via INTRAVENOUS

## 2018-06-09 MED ORDER — PHENOL 1.4 % MT LIQD: 1.0000 | OROMUCOSAL | Status: DC | PRN

## 2018-06-09 MED ORDER — METOCLOPRAMIDE HCL 5 MG/ML IJ SOLN: 5.0000 mg | Freq: Three times a day (TID) | INTRAMUSCULAR | Status: DC | PRN

## 2018-06-09 MED ORDER — BUPIVACAINE LIPOSOME 1.3 % IJ SUSP
20.0000 mL | INTRAMUSCULAR | Status: AC
  Administered 2018-06-09: 20 mL
  Filled 2018-06-09: qty 20

## 2018-06-09 MED ORDER — FENTANYL CITRATE (PF) 100 MCG/2ML IJ SOLN
100.0000 ug | Freq: Once | INTRAMUSCULAR | Status: AC
  Administered 2018-06-09: 100 ug via INTRAVENOUS
  Filled 2018-06-09: qty 2

## 2018-06-09 MED ORDER — GABAPENTIN 300 MG PO CAPS
300.0000 mg | ORAL_CAPSULE | Freq: Three times a day (TID) | ORAL | Status: DC
  Administered 2018-06-09 – 2018-06-10 (×2): 300 mg via ORAL
  Filled 2018-06-09 (×2): qty 1

## 2018-06-09 MED ORDER — ONDANSETRON HCL 4 MG/2ML IJ SOLN
INTRAMUSCULAR | Status: DC | PRN
  Administered 2018-06-09: 4 mg via INTRAVENOUS

## 2018-06-09 MED ORDER — OXYCODONE HCL 5 MG PO TABS: 5.0000 mg | ORAL_TABLET | ORAL | Status: DC | PRN

## 2018-06-09 MED ORDER — LEVOTHYROXINE SODIUM 75 MCG PO TABS
75.0000 ug | ORAL_TABLET | Freq: Every day | ORAL | Status: DC
  Administered 2018-06-10: 75 ug via ORAL
  Filled 2018-06-09: qty 1

## 2018-06-09 MED ORDER — FENTANYL CITRATE (PF) 100 MCG/2ML IJ SOLN
25.0000 ug | INTRAMUSCULAR | Status: DC | PRN
  Administered 2018-06-09 (×3): 50 ug via INTRAVENOUS

## 2018-06-09 MED ORDER — OXYCODONE HCL 5 MG PO TABS
ORAL_TABLET | ORAL | Status: AC
  Filled 2018-06-09: qty 3

## 2018-06-09 MED ORDER — POLYETHYLENE GLYCOL 3350 17 G PO PACK: 17.0000 g | PACK | Freq: Every day | ORAL | Status: DC | PRN

## 2018-06-09 SURGICAL SUPPLY — 81 items
ALCOHOL ISOPROPYL (RUBBING) (MISCELLANEOUS) ×3 IMPLANT
BAG DECANTER FOR FLEXI CONT (MISCELLANEOUS) ×3 IMPLANT
BANDAGE ESMARK 6X9 LF (GAUZE/BANDAGES/DRESSINGS) ×1 IMPLANT
BASEPLATE TIBIAL RT SZ 5 (Knees) IMPLANT
BLADE SAW SGTL 13.0X1.19X90.0M (BLADE) ×3 IMPLANT
BNDG CMPR 9X6 STRL LF SNTH (GAUZE/BANDAGES/DRESSINGS) ×1
BNDG CMPR MED 10X6 ELC LF (GAUZE/BANDAGES/DRESSINGS) ×1
BNDG CMPR MED 15X6 ELC VLCR LF (GAUZE/BANDAGES/DRESSINGS) ×1
BNDG ELASTIC 6X10 VLCR STRL LF (GAUZE/BANDAGES/DRESSINGS) ×3 IMPLANT
BNDG ELASTIC 6X15 VLCR STRL LF (GAUZE/BANDAGES/DRESSINGS) ×2 IMPLANT
BNDG ESMARK 6X9 LF (GAUZE/BANDAGES/DRESSINGS) ×3
BOWL SMART MIX CTS (DISPOSABLE) ×3 IMPLANT
BSPLAT TIB 5 CMNT M TPR KN RT (Knees) ×1 IMPLANT
CEMENT BONE R 1X40 (Cement) ×4 IMPLANT
CLOSURE STERI-STRIP 1/2X4 (GAUZE/BANDAGES/DRESSINGS) ×1
CLSR STERI-STRIP ANTIMIC 1/2X4 (GAUZE/BANDAGES/DRESSINGS) ×3 IMPLANT
COVER SURGICAL LIGHT HANDLE (MISCELLANEOUS) ×3 IMPLANT
COVER WAND RF STERILE (DRAPES) ×3 IMPLANT
CUFF TOURNIQUET SINGLE 34IN LL (TOURNIQUET CUFF) ×3 IMPLANT
CUFF TOURNIQUET SINGLE 44IN (TOURNIQUET CUFF) IMPLANT
DRAPE EXTREMITY T 121X128X90 (DRAPE) ×3 IMPLANT
DRAPE HALF SHEET 40X57 (DRAPES) ×3 IMPLANT
DRAPE INCISE IOBAN 66X45 STRL (DRAPES) IMPLANT
DRAPE ORTHO SPLIT 77X108 STRL (DRAPES) ×6
DRAPE POUCH INSTRU U-SHP 10X18 (DRAPES) ×3 IMPLANT
DRAPE SURG ORHT 6 SPLT 77X108 (DRAPES) ×2 IMPLANT
DRAPE U-SHAPE 47X51 STRL (DRAPES) ×6 IMPLANT
DURAPREP 26ML APPLICATOR (WOUND CARE) ×6 IMPLANT
ELECT CAUTERY BLADE 6.4 (BLADE) ×3 IMPLANT
ELECT REM PT RETURN 9FT ADLT (ELECTROSURGICAL) ×3
ELECTRODE REM PT RTRN 9FT ADLT (ELECTROSURGICAL) ×1 IMPLANT
FEMUR OXINIUM SZ 5 RT (Knees) ×2 IMPLANT
GAUZE SPONGE 4X4 12PLY STRL LF (GAUZE/BANDAGES/DRESSINGS) ×3 IMPLANT
GLOVE BIOGEL PI IND STRL 7.0 (GLOVE) ×1 IMPLANT
GLOVE BIOGEL PI INDICATOR 7.0 (GLOVE) ×2
GLOVE ECLIPSE 7.0 STRL STRAW (GLOVE) ×9 IMPLANT
GLOVE SKINSENSE NS SZ7.5 (GLOVE) ×2
GLOVE SKINSENSE STRL SZ7.5 (GLOVE) ×1 IMPLANT
GLOVE SURG SYN 7.5  E (GLOVE) ×8
GLOVE SURG SYN 7.5 E (GLOVE) ×4 IMPLANT
GLOVE SURG SYN 7.5 PF PI (GLOVE) ×4 IMPLANT
GOWN STRL REIN XL XLG (GOWN DISPOSABLE) ×3 IMPLANT
GOWN STRL REUS W/ TWL LRG LVL3 (GOWN DISPOSABLE) ×1 IMPLANT
GOWN STRL REUS W/TWL LRG LVL3 (GOWN DISPOSABLE) ×3
HANDPIECE INTERPULSE COAX TIP (DISPOSABLE) ×3
HOOD PEEL AWAY FLYTE STAYCOOL (MISCELLANEOUS) ×6 IMPLANT
INSERT TIBIAL SIZE 5-6 9MM THK (Insert) ×2 IMPLANT
KIT BASIN OR (CUSTOM PROCEDURE TRAY) ×3 IMPLANT
KIT TURNOVER KIT B (KITS) ×3 IMPLANT
MANIFOLD NEPTUNE II (INSTRUMENTS) ×3 IMPLANT
MARKER SKIN DUAL TIP RULER LAB (MISCELLANEOUS) ×3 IMPLANT
NDL SPNL 18GX3.5 QUINCKE PK (NEEDLE) ×2 IMPLANT
NEEDLE SPNL 18GX3.5 QUINCKE PK (NEEDLE) ×6 IMPLANT
NS IRRIG 1000ML POUR BTL (IV SOLUTION) ×3 IMPLANT
PACK TOTAL JOINT (CUSTOM PROCEDURE TRAY) ×3 IMPLANT
PAD ABD 8X10 STRL (GAUZE/BANDAGES/DRESSINGS) ×4 IMPLANT
PAD ARMBOARD 7.5X6 YLW CONV (MISCELLANEOUS) ×6 IMPLANT
PADDING CAST COTTON 6X4 STRL (CAST SUPPLIES) ×3 IMPLANT
PADDING CAST SYN 6 (CAST SUPPLIES) ×2
PADDING CAST SYNTHETIC 6X4 NS (CAST SUPPLIES) IMPLANT
PAT COMP GENESIS 7.5 THICK 35 (Knees) ×3 IMPLANT
PATELLA COMP GENES 7.5 THCK 35 (Knees) IMPLANT
SAW OSC TIP CART 19.5X105X1.3 (SAW) ×3 IMPLANT
SET HNDPC FAN SPRY TIP SCT (DISPOSABLE) ×1 IMPLANT
STAPLER VISISTAT 35W (STAPLE) IMPLANT
SUCTION FRAZIER HANDLE 10FR (MISCELLANEOUS) ×2
SUCTION TUBE FRAZIER 10FR DISP (MISCELLANEOUS) ×1 IMPLANT
SUT ETHILON 2 0 FS 18 (SUTURE) IMPLANT
SUT MNCRL AB 4-0 PS2 18 (SUTURE) IMPLANT
SUT VIC AB 0 CT1 27 (SUTURE) ×6
SUT VIC AB 0 CT1 27XBRD ANBCTR (SUTURE) ×2 IMPLANT
SUT VIC AB 1 CTX 27 (SUTURE) ×9 IMPLANT
SUT VIC AB 2-0 CT1 27 (SUTURE) ×18
SUT VIC AB 2-0 CT1 TAPERPNT 27 (SUTURE) ×4 IMPLANT
SYR 50ML LL SCALE MARK (SYRINGE) ×6 IMPLANT
TIBIAL BASEPLATE SZ 5 (Knees) ×3 IMPLANT
TOWEL OR 17X24 6PK STRL BLUE (TOWEL DISPOSABLE) ×3 IMPLANT
TOWEL OR 17X26 10 PK STRL BLUE (TOWEL DISPOSABLE) ×3 IMPLANT
TRAY CATH 16FR W/PLASTIC CATH (SET/KITS/TRAYS/PACK) IMPLANT
UNDERPAD 30X30 (UNDERPADS AND DIAPERS) ×3 IMPLANT
WRAP KNEE MAXI GEL POST OP (GAUZE/BANDAGES/DRESSINGS) ×3 IMPLANT

## 2018-06-09 NOTE — Discharge Instructions (Signed)

## 2018-06-09 NOTE — Anesthesia Postprocedure Evaluation (Signed)
Anesthesia Post Note  Patient: Andrew [name]  Procedure(s) Performed: RIGHT TOTAL KNEE ARTHROPLASTY (Right Knee)     Patient location during evaluation: PACU Anesthesia Type: Spinal Level of consciousness: awake and sedated Pain management: pain level not controlled Vital Signs Assessment: post-procedure vital signs reviewed and stable Respiratory status: spontaneous breathing Cardiovascular status: stable Postop Assessment: no apparent nausea or vomiting Anesthetic complications: no    Last Vitals:  Vitals:   06/09/18 1515 06/09/18 1550  BP: (!) 162/86 (!) 142/82  Pulse: 71 84  Resp: 11 20  Temp:    SpO2: 97% 96%    Last Pain:  Vitals:   06/09/18 1527  TempSrc:   PainSc: 9    Pain Goal: Patients Stated Pain Goal: 4 (06/09/18 1527)               Huston Foley

## 2018-06-09 NOTE — Anesthesia Procedure Notes (Signed)
Procedure Name: Intubation Date/Time: 06/09/2018 11:27 AM Performed by: Bryson Corona, CRNA Pre-anesthesia Checklist: Patient identified, Emergency Drugs available, Suction available and Patient being monitored Patient Re-evaluated:Patient Re-evaluated prior to induction Oxygen Delivery Method: Circle System Utilized Preoxygenation: Pre-oxygenation with 100% oxygen Induction Type: IV induction Ventilation: Mask ventilation without difficulty Laryngoscope Size: Mac and 4 Grade View: Grade I Tube type: Oral Tube size: 7.0 mm Number of attempts: 1 Airway Equipment and Method: Stylet and Oral airway Placement Confirmation: ETT inserted through vocal cords under direct vision,  positive ETCO2 and breath sounds checked- equal and bilateral Secured at: 23 cm Tube secured with: Tape Dental Injury: Teeth and Oropharynx as per pre-operative assessment

## 2018-06-09 NOTE — Progress Notes (Signed)
Orthopedic Tech Progress Note Patient Details:  Andrew [name] 1956-03-07 [id number]  CPM Right Knee CPM Right Knee: On Right Knee Flexion (Degrees): 90 Right Knee Extension (Degrees): 0  Post Interventions Patient Tolerated: Well Instructions Provided: Care of device, Adjustment of device  [name] 06/09/2018, 3:27 PM

## 2018-06-09 NOTE — Transfer of Care (Signed)
Immediate Anesthesia Transfer of Care Note  Patient: Andrew [name]  Procedure(s) Performed: RIGHT TOTAL KNEE ARTHROPLASTY (Right Knee)  Patient Location: PACU  Anesthesia Type:General  Level of Consciousness: awake, alert  and oriented  Airway & Oxygen Therapy: Patient Spontanous Breathing and Patient connected to nasal cannula oxygen  Post-op Assessment: Report given to RN and Post -op Vital signs reviewed and stable  Post vital signs: Reviewed and stable  Last Vitals:  Vitals Value Taken Time  BP 171/91 06/09/2018  1:46 PM  Temp    Pulse 74 06/09/2018  1:47 PM  Resp 14 06/09/2018  1:47 PM  SpO2 100 % 06/09/2018  1:47 PM  Vitals shown include unvalidated device data.  Last Pain:  Vitals:   06/09/18 0813  TempSrc:   PainSc: 0-No pain      Patients Stated Pain Goal: 3 (19/14/78 2956)  Complications: No apparent anesthesia complications

## 2018-06-09 NOTE — H&P (Signed)
PREOPERATIVE H&P  Chief Complaint: right knee degenerative joint disease  HPI: Andrew [name] is a 62 y.o. male who presents for surgical treatment of right knee degenerative joint disease.  He denies any changes in medical history.  Past Medical History:  Diagnosis Date  . Arthritis   . CRD (chronic renal disease), stage II    Baseline Cr 1.2  . CVA (cerebral vascular accident) ([place])    x4 last one in 2007, R sided residual weakness  . HAMMER TOE 07/25/2010  . HLD (hyperlipidemia)   . HTN (hypertension)   . Hypothyroidism   . Leg swelling   . Lipoma of back 03/18/2011  . Personal history of colonic adenomas 04/01/2013  . Prostatitis    hx of x2  . PSEUDOFOLLICULITIS BARBAE 09/13/1769   Qualifier: Diagnosis of  By: Danise Mina  MD, Garlon Hatchet    . Sarcoidosis   . T2DM (type 2 diabetes mellitus) ([place])    Past Surgical History:  Procedure Laterality Date  . COLONOSCOPY  03/24/2013  . COLONOSCOPY W/ POLYPECTOMY    . ERCP  2006   Normal  . ESOPHAGOGASTRODUODENOSCOPY    . EYE SURGERY Bilateral    cataracts  . [place]   x2 'inguinal  . LIPOMA EXCISION  06/22/2011   Procedure: EXCISION LIPOMA;  Surgeon: Rolm Bookbinder, MD;  Location: [facility];  Service: General;  Laterality: N/A;  . TOOTH EXTRACTION    . TOTAL KNEE ARTHROPLASTY Left 05/15/2017   Procedure: LEFT TOTAL KNEE ARTHROPLASTY;  Surgeon: Leandrew Koyanagi, MD;  Location: [place] [facility];  Service: Orthopedics;  Laterality: Left;  . TRANSTHORACIC ECHOCARDIOGRAM  2005   EF 60%   Social History   Socioeconomic History  . Marital status: Divorced    Spouse name: Not on file  . Number of children: 1  . Years of education: 69  . Highest education level: Associate degree: academic program  Occupational History  . 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 Financial resource strain: Hard  . Food insecurity:    Worry: Never true    Inability: Never  true  . Transportation needs:    Medical: No    Non-medical: No  Tobacco Use  . Smoking status: Never Smoker  . Smokeless tobacco: Never Used  . Tobacco comment: Never smoker; no plans to start  Substance and Sexual Activity  . Alcohol use: No  . Drug use: No  . Sexual activity: Not Currently  Lifestyle  . Physical activity:    Days per week: 4 days    Minutes per session: 30 min  . Stress: Only a little  Relationships  . Social connections:    Talks on phone: More than three times a week    Gets together: Once a week    Attends religious service: More than 4 times per year    Active member of club or organization: No    Attends meetings of clubs or organizations: Never    Relationship status: Divorced  Other Topics Concern  . Not on file  Social History Narrative   Patient is a retired Social worker at [employer]. Remote etoh and tobacco history, stopped 20 years ago. 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      Lives in home with son, and dog. Home is one level with 3 steps in go in. Has hand rails. No smoke alarms. Will contact fire department to  seek assistance on smoke alarms. No grab-bars in bathroom. Throw rugs are flat and attached. Walks the dog daily for exercise.   Always wears seat belt in car.      Patient does state some financial insecurity around paying for his medications, including co-pays. Had to stop insulin due to cost.    Patient needs to have his dentures modified but has not dental coverage. Will ask LCSW to reach out with resources.     Family History  Problem Relation Age of Onset  . Bone cancer Father   . Cancer Father 86       bone  . Uterine cancer Mother   . Cancer Mother   . Heart attack Brother   . Kidney disease Brother   . Diabetes Brother   . Arthritis Brother   . Heart disease Sister   . Congestive Heart Failure Sister   . Congestive Heart Failure Sister   . Congestive Heart Failure Sister   . Cancer Sister    . Diabetes Sister   . Arthritis Sister   . Breast cancer Sister   . Colon cancer Neg Hx   . Rectal cancer Neg Hx   . Stomach cancer Neg Hx   . Colon polyps Neg Hx   . Esophageal cancer Neg Hx    Allergies  Allergen Reactions  . Metoprolol Succinate     REACTION: bradycardia (HR to 42)  . Penicillins Itching    Has patient had a PCN reaction causing immediate rash, facial/tongue/throat swelling, SOB or lightheadedness with hypotension: No Has patient had a PCN reaction causing severe rash involving mucus membranes or skin necrosis: No Has patient had a PCN reaction that required hospitalization No Has patient had a PCN reaction occurring within the last 10 years: No If all of the above answers are "NO", then may proceed with Cephalosporin use.   Prior to Admission medications   Medication Sig Start Date End Date Taking? Authorizing Provider  acetaminophen (TYLENOL) 650 MG CR tablet Take 650 mg by mouth daily as needed for pain.   Yes [provider]  allopurinol (ZYLOPRIM) 100 MG tablet TAKE 2 TABLETS EVERY DAY Patient taking differently: Take 200 mg by mouth daily.  02/07/18  Yes Shawna Orleans, Elsia J, DO  amLODipine (NORVASC) 5 MG tablet TAKE 1 TABLET EVERY DAY Patient taking differently: Take 5 mg by mouth daily.  04/03/18  Yes Bufford Lope, DO  aspirin EC 81 MG tablet Take 81 mg by mouth daily.   Yes [provider]  benzoyl peroxide 5 % gel Apply topically daily. Patient taking differently: Apply 1 application topically daily as needed (rash).  03/05/18  Yes Orson Eva J, DO  cholecalciferol (VITAMIN D) 1000 units tablet TAKE 1 TABLET EVERY DAY Patient taking differently: Take 1,000 Units by mouth daily.  01/28/18  Yes Sidman Bing, DO  docusate sodium (COLACE) 100 MG capsule Take 100 mg by mouth daily.   Yes [provider]  enalapril (VASOTEC) 20 MG tablet TAKE 2 TABLETS EVERY DAY Patient taking differently: Take 40 mg by mouth daily.  12/06/17  Yes  Rogue Bussing, MD  levothyroxine (SYNTHROID, LEVOTHROID) 75 MCG tablet TAKE 1 TABLET EVERY DAY Patient taking differently: Take 75 mcg by mouth daily before breakfast.  02/07/18  Yes Orson Eva J, DO  lovastatin (MEVACOR) 40 MG tablet TAKE 1 TABLET AT BEDTIME Patient taking differently: Take 40 mg by mouth at bedtime.  02/07/18  Yes Bufford Lope, DO  methylcellulose (CITRUCEL) oral powder Take 1 packet by mouth 3 (three) times daily before meals.   Yes [provider]  tiZANidine (ZANAFLEX) 4 MG tablet Take 4 mg by mouth every 6 (six) hours as needed for muscle spasms.   Yes [provider]  Blood Glucose Monitoring Suppl (BLOOD GLUCOSE METER) kit Use to test fasting glucose daily.  Diagnosis Type II diabetes 250.0 04/14/12   Luetta Nutting, DO  Insulin Pen Needle 29G X 12.7MM MISC 1 each daily with victoza 02/06/18   Bufford Lope, DO  metFORMIN (GLUCOPHAGE) 1000 MG tablet Take 1 tablet (1,000 mg total) by mouth 2 (two) times daily with a meal. 05/30/18   Bufford Lope, DO  PRODIGY LANCETS 26G MISC Use to check fasting blood glucose each morning.  Diagnosis: Diabetes Mellitus Type II 250.00 03/30/12   Luetta Nutting, DO  TRUE METRIX BLOOD GLUCOSE TEST test strip USE  TO TEST BLOOD SUGAR TWICE DAILY  FOR  TYPE  II  DIABETES 02/20/18   Bufford Lope, DO     Positive ROS: All other systems have been reviewed and were otherwise negative with the exception of those mentioned in the HPI and as above.  Physical Exam: General: Alert, no acute distress Cardiovascular: No pedal edema Respiratory: No cyanosis, no use of accessory musculature GI: abdomen soft Skin: No lesions in the area of chief complaint Neurologic: Sensation intact distally Psychiatric: Patient is competent for consent with normal mood and affect Lymphatic: no lymphedema  MUSCULOSKELETAL: exam stable  Assessment: right knee degenerative joint disease  Plan: Plan for Procedure(s): RIGHT TOTAL KNEE  ARTHROPLASTY  The risks benefits and alternatives were discussed with the patient including but not limited to the risks of nonoperative treatment, versus surgical intervention including infection, bleeding, nerve injury,  blood clots, cardiopulmonary complications, morbidity, mortality, among others, and they were willing to proceed.   Preoperative templating of the joint replacement has been completed, documented, and submitted to the Operating Room personnel in order to optimize intra-operative equipment management.  Anticipated LOS equal to or greater than 2 midnights due to - Age 43 and older with one or more of the following:  - Obesity  - Expected need for hospital services (PT, OT, Nursing) required for safe  discharge  - Anticipated need for postoperative skilled nursing care or inpatient rehab  - Active co-morbidities: None  Eduard Roux, MD   06/09/2018 7:16 AM

## 2018-06-09 NOTE — Op Note (Signed)
Total Knee Arthroplasty Procedure Note  Preoperative diagnosis: Right knee osteoarthritis  Postoperative diagnosis:same  Operative procedure: Right total knee arthroplasty. CPT [phone]  Surgeon: N. Eduard Roux, MD  Assist: Madalyn Rob, PA-C; necessary for the timely completion of procedure and due to complexity of procedure.  Anesthesia: Spinal, regional  Tourniquet time: 60 mins  Implants used: Smith and Nephew Femur: PS 5 Tibia: 5 Patella: 35 mm Polyethylene: 9 mm  Indication: Andrew [name] is a 62 y.o. year old male with a history of knee pain. Having failed conservative management, the patient elected to proceed with a total knee arthroplasty.  We have reviewed the risk and benefits of the surgery and they elected to proceed after voicing understanding.  Procedure:  After informed consent was obtained and understanding of the risk were voiced including but not limited to bleeding, infection, damage to surrounding structures including nerves and vessels, blood clots, leg length inequality and the failure to achieve desired results, the operative extremity was marked with verbal confirmation of the patient in the holding area.   The patient was then brought to the operating room and transported to the operating room table in the supine position.  A tourniquet was applied to the operative extremity around the upper thigh. The operative limb was then prepped and draped in the usual sterile fashion and preoperative antibiotics were administered.  A time out was performed prior to the start of surgery confirming the correct extremity, preoperative antibiotic administration, as well as team members, implants and instruments available for the case. Correct surgical site was also confirmed with preoperative radiographs. The limb was then elevated for exsanguination and the tourniquet was inflated. A midline incision was made and a standard medial parapatellar approach was  performed.  The patella was prepared and sized to a 35 mm.  A cover was placed on the patella for protection from retractors.  We then turned our attention to the femur. Posterior cruciate ligament was sacrificed. Start site was drilled in the femur and the intramedullary distal femoral cutting guide was placed, set at 5 degrees valgus, taking 11 mm of distal resection. The distal cut was made. Osteophytes were then removed. Next, the proximal tibial cutting guide was placed with appropriate slope, varus/valgus alignment and depth of resection. The proximal tibial cut was made. Gap blocks were then used to assess the extension gap and alignment, and appropriate soft tissue releases were performed. Attention was turned back to the femur, which was sized using the sizing guide to a size 5. Appropriate rotation of the femoral component was determined using epicondylar axis, Whiteside's line, and assessing the flexion gap under ligament tension. The appropriate size 4-in-1 cutting block was placed and cuts were made. Posterior femoral osteophytes and uncapped bone were then removed with the curved osteotome. The tibia was sized for a size 5 component. The femoral box-cutting guide was placed and prepared for a PS femoral component. Trial components were placed, and stability was checked in full extension, mid-flexion, and deep flexion. Proper tibial rotation was determined and marked.  The patella tracked well without a lateral release. Trial components were then removed and tibial preparation performed. A posterior capsular injection comprising of 20 cc of 1.3% exparel and 40 cc of normal saline was performed for postoperative pain control. The bony surfaces were irrigated with a pulse lavage and then dried. Bone cement was vacuum mixed on the back table, and the final components sized above were cemented into place. After cement had  finished curing, excess cement was removed. The stability of the construct was  re-evaluated throughout a range of motion and found to be acceptable. The trial liner was removed, the knee was copiously irrigated, and the knee was re-evaluated for any excess bone debris. The real polyethylene liner, 9 mm thick, was inserted and checked to ensure the locking mechanism had engaged appropriately. The tourniquet was deflated and hemostasis was achieved. The wound was irrigated with normal saline.  One gram of vancomycin powder was placed in the surgical bed. A drain was not placed. Capsular closure was performed with a #1 vicryl, subcutaneous fat closed with a 0 vicryl suture, then subcutaneous tissue closed with interrupted 2.0 vicryl suture. The skin was then closed with a 3.0 monocryl. A sterile dressing was applied.  The patient was awakened in the operating room and taken to recovery in stable condition. All sponge, needle, and instrument counts were correct at the end of the case.  Position: supine  Complications: none.  Time Out: performed   Drains/Packing: none  Estimated blood loss: minimal  Returned to Recovery Room: in good condition.   Antibiotics: yes   Mechanical VTE (DVT) Prophylaxis: sequential compression devices, TED thigh-high  Chemical VTE (DVT) Prophylaxis: aspirin  Fluid Replacement  Crystalloid: see anesthesia record Blood: none  FFP: none   Specimens Removed: 1 to pathology   Sponge and Instrument Count Correct? yes   PACU: portable radiograph - knee AP and Lateral   Admission: inpatient status  Plan/RTC: Return in 2 weeks for wound check.   Weight Bearing/Load Lower Extremity: full   N. Eduard Roux, MD [place] 1:07 PM

## 2018-06-09 NOTE — Anesthesia Procedure Notes (Signed)
Anesthesia Regional Block: Adductor canal block   Pre-Anesthetic Checklist: ,, timeout performed, Correct Patient, Correct Site, Correct Laterality, Correct Procedure, Correct Position, site marked, Risks and benefits discussed,  Surgical consent,  Pre-op evaluation,  At surgeon's request and post-op pain management  Laterality: Right and Lower  Prep: chloraprep       Needles:  Injection technique: Single-shot  Needle Type: Echogenic Stimulator Needle     Needle Length: 10cm  Needle Gauge: 21   Needle insertion depth: 2 cm   Additional Needles:   Narrative:  Start time: 06/09/2018 10:00 AM End time: 06/09/2018 10:07 AM Injection made incrementally with aspirations every 5 mL.  Performed by: Personally  Anesthesiologist: Lyn Hollingshead, MD

## 2018-06-09 NOTE — Anesthesia Preprocedure Evaluation (Addendum)
Anesthesia Evaluation  Patient identified by MRN, date of birth, ID band Patient awake    Reviewed: Allergy & Precautions, NPO status , Patient's Chart, lab work & pertinent test results  Airway Mallampati: I  TM Distance: >3 FB     Dental no notable dental hx. (+) Teeth Intact   Pulmonary neg pulmonary ROS,    Pulmonary exam normal breath sounds clear to auscultation       Cardiovascular hypertension, Pt. on medications Normal cardiovascular exam Rhythm:Regular Rate:Normal     Neuro/Psych    GI/Hepatic negative GI ROS, Neg liver ROS,   Endo/Other  diabetes, Type 2, Oral Hypoglycemic AgentsHypothyroidism   Renal/GU Renal diseaseCKD II     Musculoskeletal  (+) Arthritis , Osteoarthritis,    Abdominal Normal abdominal exam  (+)   Peds  Hematology   Anesthesia Other Findings   Reproductive/Obstetrics                             Anesthesia Physical  Anesthesia Plan  ASA: II  Anesthesia Plan: General   Post-op Pain Management:  Regional for Post-op pain   Induction: Intravenous  PONV Risk Score and Plan: 2 and Ondansetron, Dexamethasone and Treatment may vary due to age or medical condition  Airway Management Planned: LMA  Additional Equipment:   Intra-op Plan:   Post-operative Plan:   Informed Consent: I have reviewed the patients History and Physical, chart, labs and discussed the procedure including the risks, benefits and alternatives for the proposed anesthesia with the patient or authorized representative who has indicated his/her understanding and acceptance.     Plan Discussed with: CRNA  Anesthesia Plan Comments:        Anesthesia Quick Evaluation

## 2018-06-10 ENCOUNTER — Encounter (HOSPITAL_COMMUNITY): Payer: Self-pay | Admitting: Orthopaedic Surgery

## 2018-06-10 DIAGNOSIS — Z7982 Long term (current) use of aspirin: Secondary | ICD-10-CM | POA: Diagnosis not present

## 2018-06-10 DIAGNOSIS — Z8673 Personal history of transient ischemic attack (TIA), and cerebral infarction without residual deficits: Secondary | ICD-10-CM | POA: Diagnosis not present

## 2018-06-10 DIAGNOSIS — N182 Chronic kidney disease, stage 2 (mild): Secondary | ICD-10-CM | POA: Diagnosis not present

## 2018-06-10 DIAGNOSIS — E039 Hypothyroidism, unspecified: Secondary | ICD-10-CM | POA: Diagnosis not present

## 2018-06-10 DIAGNOSIS — Z96652 Presence of left artificial knee joint: Secondary | ICD-10-CM | POA: Diagnosis not present

## 2018-06-10 DIAGNOSIS — Z79899 Other long term (current) drug therapy: Secondary | ICD-10-CM | POA: Diagnosis not present

## 2018-06-10 DIAGNOSIS — I129 Hypertensive chronic kidney disease with stage 1 through stage 4 chronic kidney disease, or unspecified chronic kidney disease: Secondary | ICD-10-CM | POA: Diagnosis not present

## 2018-06-10 DIAGNOSIS — M1711 Unilateral primary osteoarthritis, right knee: Secondary | ICD-10-CM | POA: Diagnosis not present

## 2018-06-10 DIAGNOSIS — E1122 Type 2 diabetes mellitus with diabetic chronic kidney disease: Secondary | ICD-10-CM | POA: Diagnosis not present

## 2018-06-10 LAB — BASIC METABOLIC PANEL
Anion gap: 11 (ref 5–15)
BUN: 10 mg/dL (ref 8–23)
CO2: 22 mmol/L (ref 22–32)
Calcium: 8.3 mg/dL — ABNORMAL LOW (ref 8.9–10.3)
Chloride: 99 mmol/L (ref 98–111)
Creatinine, Ser: 1.3 mg/dL — ABNORMAL HIGH (ref 0.61–1.24)
GFR calc non Af Amer: 58 mL/min — ABNORMAL LOW (ref >60)
Glucose, Bld: 201 mg/dL — ABNORMAL HIGH (ref 70–99)
Potassium: 4.1 mmol/L (ref 3.5–5.1)
Sodium: 132 mmol/L — ABNORMAL LOW (ref 135–145)

## 2018-06-10 LAB — CBC
HCT: 39.2 % (ref 39.0–52.0)
Hemoglobin: 12.5 g/dL — ABNORMAL LOW (ref 13.0–17.0)
MCH: 29.3 pg (ref 26.0–34.0)
MCHC: 31.9 g/dL (ref 30.0–36.0)
MCV: 92 fL (ref 80.0–100.0)
Platelets: 213 10*3/uL (ref 150–400)
RBC: 4.26 MIL/uL (ref 4.22–5.81)
RDW: 13 % (ref 11.5–15.5)
WBC: 8.9 10*3/uL (ref 4.0–10.5)
nRBC: 0 % (ref 0.0–0.2)

## 2018-06-10 LAB — GLUCOSE, CAPILLARY
GLUCOSE-CAPILLARY: 183 mg/dL — AB (ref 70–99)
Glucose-Capillary: 157 mg/dL — ABNORMAL HIGH (ref 70–99)

## 2018-06-10 NOTE — Discharge Summary (Signed)
Patient ID: Andrew [name] MRN: [id number] DOB/AGE: 1955-11-11 62 y.o.  Admit date: 06/09/2018 Discharge date: 06/10/2018  Admission Diagnoses:  Active Problems:   Total knee replacement status   Discharge Diagnoses:  Same  Past Medical History:  Diagnosis Date  . Arthritis   . CRD (chronic renal disease), stage II    Baseline Cr 1.2  . 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 HAMMER TOE 07/25/2010  . HLD (hyperlipidemia)   . HTN (hypertension)   . Hypothyroidism   . Leg swelling   . Lipoma of back 03/18/2011  . Personal history of colonic adenomas 04/01/2013  . Prostatitis    hx of x2  . PSEUDOFOLLICULITIS BARBAE 10/15/2008   Qualifier: Diagnosis of  By: Danise Mina  MD, Garlon Hatchet    . Sarcoidosis   . T2DM (type 2 diabetes mellitus) ([place])     Surgeries: Procedure(s): RIGHT TOTAL KNEE ARTHROPLASTY on 06/09/2018   Consultants:   Discharged Condition: Improved  Hospital Course: Andrew [name] is an 62 y.o. male who was admitted 06/09/2018 for operative treatment of primary localized osteoarthritis right knee. Patient has severe unremitting pain that affects sleep, daily activities, and work/hobbies. After pre-op clearance the patient was taken to the operating room on 06/09/2018 and underwent  Procedure(s): RIGHT TOTAL KNEE ARTHROPLASTY.    Patient was given perioperative antibiotics:  Anti-infectives (From admission, onward)   Start     Dose/Rate Route Frequency Ordered Stop   06/09/18 2100  clindamycin (CLEOCIN) IVPB 600 mg     600 mg 100 mL/hr over 30 Minutes Intravenous Every 6 hours 06/09/18 2029 06/10/18 0438   06/09/18 1030  vancomycin (VANCOCIN) powder  Status:  Discontinued       As needed 06/09/18 1030 06/09/18 1342   06/09/18 0800  ceFAZolin (ANCEF) IVPB 2g/100 mL premix     2 g 200 mL/hr over 30 Minutes Intravenous On call to O.R. 06/09/18 0749 06/09/18 1135   06/09/18 0000  sulfamethoxazole-trimethoprim  (BACTRIM DS,SEPTRA DS) 800-160 MG tablet     1 tablet Oral 2 times daily 06/09/18 1311         Patient was given sequential compression devices, early ambulation, and chemoprophylaxis to prevent DVT.  Patient benefited maximally from hospital stay and there were no complications.    Recent vital signs:  Patient Vitals for the past 24 hrs:  BP Temp Temp src Pulse Resp SpO2  06/10/18 0556 (!) 143/74 98.7 F (37.1 C) Oral 63 18 93 %  06/10/18 0027 127/64 98.6 F (37 C) Oral 71 17 96 %  06/09/18 2022 (!) 154/76 98.1 F (36.7 C) Oral 72 20 94 %  06/09/18 1945 (!) 148/87 - - 73 12 93 %  06/09/18 1915 (!) 143/72 - - 64 11 94 %  06/09/18 1845 (!) 142/97 - - 80 17 97 %  06/09/18 1815 (!) 159/90 - - 73 12 97 %  06/09/18 1745 (!) 153/98 - - 85 16 97 %  06/09/18 1715 (!) 154/84 - - 91 16 96 %  06/09/18 1645 (!) 148/79 - - 83 12 96 %  06/09/18 1615 (!) 145/79 - - 79 14 94 %  06/09/18 1550 (!) 142/82 - - 84 20 96 %  06/09/18 1515 (!) 162/86 - - 71 11 97 %  06/09/18 1500 (!) 161/75 (!) 97.5 F (36.4 C) - 74 12 95 %  06/09/18 1445 (!) 149/88 - - 70 15 96 %  06/09/18 1430 (!) 181/83 - - 70 12 97 %  06/09/18 1415 (!) 188/85 - - 73 12 98 %  06/09/18 1400 (!) 178/85 - - 73 18 100 %  06/09/18 1345 (!) 171/91 97.6 F (36.4 C) - 78 14 100 %  06/09/18 1015 (!) 149/73 - - 61 18 100 %  06/09/18 1010 - - - (!) 56 13 100 %  06/09/18 1005 (!) 152/76 - - (!) 54 12 100 %  06/09/18 1000 - - - (!) 56 13 100 %  06/09/18 0955 (!) 176/86 - - (!) 57 16 100 %     Recent laboratory studies:  Recent Labs    06/10/18 0402  WBC 8.9  HGB 12.5*  HCT 39.2  PLT 213  NA 132*  K 4.1  CL 99  CO2 22  BUN 10  CREATININE 1.30*  GLUCOSE 201*  CALCIUM 8.3*     Discharge Medications:   Allergies as of 06/10/2018      Reactions   Metoprolol Succinate    REACTION: bradycardia (HR to 42)   Penicillins Itching   Has patient had a PCN reaction causing immediate rash, facial/tongue/throat swelling, SOB or  lightheadedness with hypotension: No Has patient had a PCN reaction causing severe rash involving mucus membranes or skin necrosis: No Has patient had a PCN reaction that required hospitalization No Has patient had a PCN reaction occurring within the last 10 years: No If all of the above answers are "NO", then may proceed with Cephalosporin use.      Medication List    STOP taking these medications   acetaminophen 650 MG CR tablet Commonly known as:  TYLENOL   tiZANidine 4 MG tablet Commonly known as:  ZANAFLEX     TAKE these medications   allopurinol 100 MG tablet Commonly known as:  ZYLOPRIM TAKE 2 TABLETS EVERY DAY   amLODipine 5 MG tablet Commonly known as:  NORVASC TAKE 1 TABLET EVERY DAY   aspirin EC 81 MG tablet Take 1 tablet (81 mg total) by mouth 2 (two) times daily. What changed:  when to take this   benzoyl peroxide 5 % gel Apply topically daily. What changed:    how much to take  when to take this  reasons to take this   blood glucose meter kit and supplies Use to test fasting glucose daily.  Diagnosis Type II diabetes 250.0   cholecalciferol 25 MCG (1000 UT) tablet Commonly known as:  VITAMIN D TAKE 1 TABLET EVERY DAY   CITRUCEL oral powder Generic drug:  methylcellulose Take 1 packet by mouth 3 (three) times daily before meals.   docusate sodium 100 MG capsule Commonly known as:  COLACE Take 100 mg by mouth daily.   enalapril 20 MG tablet Commonly known as:  VASOTEC TAKE 2 TABLETS EVERY DAY   Insulin Pen Needle 29G X 12.7MM Misc 1 each daily with victoza   levothyroxine 75 MCG tablet Commonly known as:  SYNTHROID, LEVOTHROID TAKE 1 TABLET EVERY DAY What changed:  when to take this   lovastatin 40 MG tablet Commonly known as:  MEVACOR TAKE 1 TABLET AT BEDTIME   metFORMIN 1000 MG tablet Commonly known as:  GLUCOPHAGE Take 1 tablet (1,000 mg total) by mouth 2 (two) times daily with a meal.   methocarbamol 750 MG tablet Commonly  known as:  ROBAXIN Take 1 tablet (750 mg total) by mouth 2 (two) times daily as needed for muscle spasms.   ondansetron 4 MG tablet  Commonly known as:  ZOFRAN Take 1-2 tablets (4-8 mg total) by mouth every 8 (eight) hours as needed for nausea or vomiting.   oxyCODONE 10 mg 12 hr tablet Commonly known as:  OXYCONTIN Take 1 tablet (10 mg total) by mouth every 12 (twelve) hours for 3 days.   oxyCODONE 5 MG immediate release tablet Commonly known as:  Oxy IR/ROXICODONE Take 1-3 tablets (5-15 mg total) by mouth every 4 (four) hours as needed.   PRODIGY LANCETS 26G Misc Use to check fasting blood glucose each morning.  Diagnosis: Diabetes Mellitus Type II 250.00   promethazine 25 MG tablet Commonly known as:  PHENERGAN Take 1 tablet (25 mg total) by mouth every 6 (six) hours as needed for nausea.   senna-docusate 8.6-50 MG tablet Commonly known as:  Senokot-S Take 1 tablet by mouth at bedtime as needed.   sulfamethoxazole-trimethoprim 800-160 MG tablet Commonly known as:  BACTRIM DS,SEPTRA DS Take 1 tablet by mouth 2 (two) times daily.   TRUE METRIX BLOOD GLUCOSE TEST test strip Generic drug:  glucose blood USE  TO TEST BLOOD SUGAR TWICE DAILY  FOR  TYPE  II  DIABETES            Durable Medical Equipment  (From admission, onward)         Start     Ordered   06/09/18 2030  DME Walker rolling  Once    Question:  Patient needs a walker to treat with the following condition  Answer:  Total knee replacement status   06/09/18 2029   06/09/18 2030  DME 3 n 1  Once     06/09/18 2029   06/09/18 2030  DME Bedside commode  Once    Question:  Patient needs a bedside commode to treat with the following condition  Answer:  Total knee replacement status   06/09/18 2029          Diagnostic Studies: Dg Chest 2 View  Result Date: 05/29/2018 CLINICAL DATA:  Preop for knee replacement surgery. EXAM: CHEST - 2 VIEW COMPARISON:  Radiographs of July 21, 2004. FINDINGS: The heart  size and mediastinal contours are within normal limits. Both lungs are clear. The visualized skeletal structures are unremarkable. IMPRESSION: No active cardiopulmonary disease. Electronically Signed   By: Marijo Conception, M.D.   On: 05/29/2018 10:05   Dg Knee Right Port  Result Date: 06/09/2018 CLINICAL DATA:  Knee arthroplasty EXAM: PORTABLE RIGHT KNEE - 1-2 VIEW COMPARISON:  01/24/2017 FINDINGS: Total knee arthroplasty that is well seated. No periprosthetic fracture. No subluxation. Expected soft tissue gas and swelling. IMPRESSION: Total knee arthroplasty without complicating feature. Electronically Signed   By: Monte Fantasia M.D.   On: 06/09/2018 15:44   Xr Knee Complete 4 Views Left  Result Date: 05/21/2018 Stable total knee replacement.  Xr Knee Complete 4 Views Right  Result Date: 05/21/2018 tricompartmental DJD   Disposition: Discharge disposition: 01-Home or Self Care         Follow-up Information    Leandrew Koyanagi, MD In 2 weeks.   Specialty:  Orthopedic Surgery Why:  For suture removal, For wound re-check Contact information: [place]  [id number] [phone]            Signed: Aundra Dubin 06/10/2018, 8:00 AM

## 2018-06-10 NOTE — Progress Notes (Signed)
Patient discharged with [facility].  They already came here and spoke with him.  Patient vitals WNL and stated that he has no pain at this time.

## 2018-06-10 NOTE — Care Management Note (Signed)
Case Management Note  Patient Details  Name: Andrew [name] MRN: [id number] Date of Birth: 11/03/1955  Subjective/Objective:   62 yr old gentleman s/p right total knee arthroplasty.                Action/Plan: Patient was preoperatively setup with [facility], no changes. Has DME and will have support at discharge.    Expected Discharge Date:  06/10/18               Expected Discharge Plan:  [place]  In-House Referral:  NA  Discharge planning Services  CM Consult  Post Acute Care Choice:  Home Health Choice offered to:  Patient  DME Arranged:  (has DME) DME Agency:  NA  HH Arranged:  PT [place] Agency:  [facility] (formerly [employer])  Status of Service:  Completed, signed off  If discussed at [employer], dates discussed:    Additional Comments:  Ninfa Meeker, RN 06/10/2018, 1:43 PM

## 2018-06-10 NOTE — Evaluation (Signed)
Physical Therapy Evaluation Patient Details Name: Andrew [name] MRN: [id number] DOB: [birth year] Today's Date: 06/10/2018   History of Present Illness  62yo male who received R TKA on 06/09/18. [place] CVA 2007 with R residual weakness, HTN, sarcoidosis, DM, L TKA   Clinical Impression  Patient received in bed, very pleasant and highly motivated to participate in therapy today. Able to complete bed mobility with Mod(I), and transfers/gait approximately 566f with RW and S for safety, cues for sequencing with RW and correct gait mechanics provided by therapist. Introduced stair training with B railings and S, VC for correct sequencing, otherwise patient able to navigate steps easily today. R knee AROM 11 degrees extension in supine, 110 degrees flexion seated EOB. He was left up in the recliner with all needs met and all questions/concerns addressed this morning. Plan to review HEP and complete all other education in afternoon session.     Follow Up Recommendations Follow surgeon's recommendation for DC plan and follow-up therapies    Equipment Recommendations  Other (comment)(tub bench, otherwise has all necessary DME )    Recommendations for Other Services       Precautions / Restrictions Precautions Precautions: None Restrictions Weight Bearing Restrictions: Yes RLE Weight Bearing: Weight bearing as tolerated      Mobility  Bed Mobility Overal bed mobility: Modified Independent             General bed mobility comments: no physical assist or cues given  Transfers Overall transfer level: Needs assistance Equipment used: Rolling walker (2 wheeled) Transfers: Sit to/from Stand Sit to Stand: Supervision         General transfer comment: S for safety, good hand placement and sequencing noted, no physical assist given   Ambulation/Gait Ambulation/Gait assistance: Supervision Gait Distance (Feet): 500 Feet Assistive device: Rolling walker (2 wheeled) Gait  Pattern/deviations: Step-through pattern;Decreased dorsiflexion - right;Decreased dorsiflexion - left Gait velocity: decreased    General Gait Details: cues for sequencing with RW, heel-toe pattern, and upright posture, patient otherwise with good step-through pattern and very safe/steady with RW   Stairs Stairs: Yes Stairs assistance: Supervision Stair Management: Two rails Number of Stairs: 5(2 large steps, 3 small steps ) General stair comments: step to pattern with cues for sequencing on steps, S and very safe/steady otherwise   Wheelchair Mobility    Modified Rankin (Stroke Patients Only)       Balance Overall balance assessment: Needs assistance Sitting-balance support: No upper extremity supported;Feet supported Sitting balance-Leahy Scale: Normal     Standing balance support: No upper extremity supported;During functional activity Standing balance-Leahy Scale: Fair Standing balance comment: able to maintain static stance without UE support and close S for safety                              Pertinent Vitals/Pain Pain Assessment: No/denies pain    Home Living Family/patient expects to be discharged to:: Private residence Living Arrangements: Children Available Help at Discharge: Family;Friend(s);Available PRN/intermittently Type of Home: House Home Access: Stairs to enter Entrance Stairs-Rails: Can reach both Entrance Stairs-Number of Steps: 3 Home Layout: One level Home Equipment: Walker - 2 wheels;Cane - single point;Toilet riser      Prior Function Level of Independence: Independent               Hand Dominance   Dominant Hand: Right    Extremity/Trunk Assessment   Upper Extremity Assessment Upper Extremity Assessment: Overall WFL for tasks  assessed    Lower Extremity Assessment Lower Extremity Assessment: Overall WFL for tasks assessed    Cervical / Trunk Assessment Cervical / Trunk Assessment: Normal  Communication    Communication: No difficulties  Cognition Arousal/Alertness: Awake/alert Behavior During Therapy: WFL for tasks assessed/performed Overall Cognitive Status: Within Functional Limits for tasks assessed                                        General Comments      Exercises Total Joint Exercises Goniometric ROM: R knee AROM: 11 degrees extension in supine, 110 degrees flexion seated EOB    Assessment/Plan    PT Assessment Patient needs continued PT services  PT Problem List Decreased strength;Decreased range of motion;Decreased knowledge of use of DME;Decreased mobility;Decreased balance;Decreased skin integrity       PT Treatment Interventions DME instruction;Balance training;Gait training;Neuromuscular re-education;Stair training;Functional mobility training;Patient/family education;Therapeutic activities;Therapeutic exercise;Manual techniques    PT Goals (Current goals can be found in the Care Plan section)  Acute Rehab PT Goals Patient Stated Goal: go home, transition to next stage of PT  PT Goal Formulation: With patient Time For Goal Achievement: 06/24/18 Potential to Achieve Goals: Good    Frequency 7X/week   Barriers to discharge        Co-evaluation               AM-PAC PT "6 Clicks" Mobility  Outcome Measure Help needed turning from your back to your side while in a flat bed without using bedrails?: None Help needed moving from lying on your back to sitting on the side of a flat bed without using bedrails?: None Help needed moving to and from a bed to a chair (including a wheelchair)?: A Little Help needed standing up from a chair using your arms (e.g., wheelchair or bedside chair)?: A Little Help needed to walk in hospital room?: A Little Help needed climbing 3-5 steps with a railing? : A Little 6 Click Score: 20    End of Session   Activity Tolerance: Patient tolerated treatment well Patient left: in chair;with call bell/phone within  reach   PT Visit Diagnosis: Difficulty in walking, not elsewhere classified (R26.2)    Time: 7510-2585 PT Time Calculation (min) (ACUTE ONLY): 33 min   Charges:   PT Evaluation $PT Eval Low Complexity: 1 Low PT Treatments $Gait Training: 8-22 mins        Deniece Ree PT, DPT, CBIS  Supplemental Physical Therapist [place]    Pager [phone] Acute Rehab Office [phone]

## 2018-06-10 NOTE — Care Management Obs Status (Signed)
[facility] NOTIFICATION   Patient Details  Name: Andrew [name] MRN: [id number] Date of Birth: Oct 14, 1955   Medicare Observation Status Notification Given:  Yes    Ninfa Meeker, RN 06/10/2018, 1:10 PM

## 2018-06-10 NOTE — Plan of Care (Signed)
  Problem: Education: Goal: Knowledge of General Education information will improve Description Including pain rating scale, medication(s)/side effects and non-pharmacologic comfort measures 06/10/2018 1456 by Lurline Idol, RN Outcome: Adequate for Discharge 06/10/2018 0745 by Lurline Idol, RN Outcome: Progressing   Problem: Health Behavior/Discharge Planning: Goal: Ability to manage health-related needs will improve 06/10/2018 1456 by Lurline Idol, RN Outcome: Adequate for Discharge 06/10/2018 0745 by Lurline Idol, RN Outcome: Progressing   Problem: Clinical Measurements: Goal: Ability to maintain clinical measurements within normal limits will improve 06/10/2018 1456 by Lurline Idol, RN Outcome: Adequate for Discharge 06/10/2018 0745 by Lurline Idol, RN Outcome: Progressing Goal: Will remain free from infection 06/10/2018 1456 by Lurline Idol, RN Outcome: Adequate for Discharge 06/10/2018 0745 by Lurline Idol, RN Outcome: Progressing Goal: Diagnostic test results will improve 06/10/2018 1456 by Lurline Idol, RN Outcome: Adequate for Discharge 06/10/2018 0745 by Lurline Idol, RN Outcome: Progressing Goal: Respiratory complications will improve 06/10/2018 1456 by Lurline Idol, RN Outcome: Adequate for Discharge 06/10/2018 0745 by Lurline Idol, RN Outcome: Progressing Goal: Cardiovascular complication will be avoided 06/10/2018 1456 by Lurline Idol, RN Outcome: Adequate for Discharge 06/10/2018 0745 by Lurline Idol, RN Outcome: Progressing   Problem: Activity: Goal: Risk for activity intolerance will decrease 06/10/2018 1456 by Lurline Idol, RN Outcome: Adequate for Discharge 06/10/2018 0745 by Lurline Idol, RN Outcome: Progressing   Problem: Nutrition: Goal: Adequate nutrition will be maintained 06/10/2018 1456 by Lurline Idol, RN Outcome: Adequate for Discharge 06/10/2018 0745 by Lurline Idol, RN Outcome: Progressing   Problem:  Coping: Goal: Level of anxiety will decrease 06/10/2018 1456 by Lurline Idol, RN Outcome: Adequate for Discharge 06/10/2018 0745 by Lurline Idol, RN Outcome: Progressing   Problem: Elimination: Goal: Will not experience complications related to bowel motility 06/10/2018 1456 by Lurline Idol, RN Outcome: Adequate for Discharge 06/10/2018 0745 by Lurline Idol, RN Outcome: Progressing Goal: Will not experience complications related to urinary retention 06/10/2018 1456 by Lurline Idol, RN Outcome: Adequate for Discharge 06/10/2018 0745 by Lurline Idol, RN Outcome: Progressing   Problem: Pain Managment: Goal: General experience of comfort will improve 06/10/2018 1456 by Lurline Idol, RN Outcome: Adequate for Discharge 06/10/2018 0745 by Lurline Idol, RN Outcome: Progressing   Problem: Safety: Goal: Ability to remain free from injury will improve 06/10/2018 1456 by Lurline Idol, RN Outcome: Adequate for Discharge 06/10/2018 0745 by Lurline Idol, RN Outcome: Progressing   Problem: Skin Integrity: Goal: Risk for impaired skin integrity will decrease 06/10/2018 1456 by Lurline Idol, RN Outcome: Adequate for Discharge 06/10/2018 0745 by Lurline Idol, RN Outcome: Progressing   Problem: Clinical Measurements: Goal: Postoperative complications will be avoided or minimized 06/10/2018 1456 by Lurline Idol, RN Outcome: Adequate for Discharge 06/10/2018 0745 by Lurline Idol, RN Outcome: Progressing   Problem: Pain Management: Goal: Pain level will decrease with appropriate interventions 06/10/2018 1456 by Lurline Idol, RN Outcome: Adequate for Discharge 06/10/2018 0745 by Lurline Idol, RN Outcome: Progressing

## 2018-06-10 NOTE — Progress Notes (Addendum)
Subjective: 1 Day Post-Op Procedure(s) (LRB): RIGHT TOTAL KNEE ARTHROPLASTY (Right) Patient reports pain as mild.  Doing great this am,.   Objective: Vital signs in last 24 hours: Temp:  [97.5 F (36.4 C)-98.7 F (37.1 C)] 98.7 F (37.1 C) (12/03 0556) Pulse Rate:  [54-91] 63 (12/03 0556) Resp:  [11-20] 18 (12/03 0556) BP: (127-188)/(64-98) 143/74 (12/03 0556) SpO2:  [93 %-100 %] 93 % (12/03 0556)  Intake/Output from previous day: 12/02 0701 - 12/03 0700 In: 1480 [P.O.:480; I.V.:1000] Out: 2450 [Urine:2300; Blood:150] Intake/Output this shift: No intake/output data recorded.  Recent Labs    06/10/18 0402  HGB 12.5*   Recent Labs    06/10/18 0402  WBC 8.9  RBC 4.26  HCT 39.2  PLT 213   Recent Labs    06/10/18 0402  NA 132*  K 4.1  CL 99  CO2 22  BUN 10  CREATININE 1.30*  GLUCOSE 201*  CALCIUM 8.3*   No results for input(s): LABPT, INR in the last 72 hours.  Neurologically intact Neurovascular intact Sensation intact distally Intact pulses distally Dorsiflexion/Plantar flexion intact Incision: dressing C/D/I No cellulitis present Compartment soft  Anticipated LOS equal to or greater than 2 midnights due to - Age 4 and older with one or more of the following:  - Obesity  - Expected need for hospital services (PT, OT, Nursing) required for safe  discharge  - Anticipated need for postoperative skilled nursing care or inpatient rehab  - Active co-morbidities: Diabetes OR   - Unanticipated findings during/Post Surgery: Slow post-op progression: GI, pain control, mobility  - Patient is a high risk of re-admission due to: None   Assessment/Plan: 1 Day Post-Op Procedure(s) (LRB): RIGHT TOTAL KNEE ARTHROPLASTY (Right) Advance diet Up with therapy Discharge home with home health after second PT session WBAT RLE ABLA-mild and stable PLEASE APPLY TED HOSE TO RLE    Aundra Dubin 06/10/2018, 7:57 AM

## 2018-06-10 NOTE — Plan of Care (Signed)
  Problem: Education: Goal: Knowledge of General Education information will improve Description Including pain rating scale, medication(s)/side effects and non-pharmacologic comfort measures Outcome: Progressing   Problem: Health Behavior/Discharge Planning: Goal: Ability to manage health-related needs will improve Outcome: Progressing   Problem: Clinical Measurements: Goal: Ability to maintain clinical measurements within normal limits will improve Outcome: Progressing Goal: Will remain free from infection Outcome: Progressing Goal: Diagnostic test results will improve Outcome: Progressing Goal: Respiratory complications will improve Outcome: Progressing Goal: Cardiovascular complication will be avoided Outcome: Progressing   Problem: Activity: Goal: Risk for activity intolerance will decrease Outcome: Progressing   Problem: Nutrition: Goal: Adequate nutrition will be maintained Outcome: Progressing   Problem: Coping: Goal: Level of anxiety will decrease Outcome: Progressing   Problem: Elimination: Goal: Will not experience complications related to bowel motility Outcome: Progressing Goal: Will not experience complications related to urinary retention Outcome: Progressing   Problem: Pain Managment: Goal: General experience of comfort will improve Outcome: Progressing   Problem: Safety: Goal: Ability to remain free from injury will improve Outcome: Progressing   Problem: Skin Integrity: Goal: Risk for impaired skin integrity will decrease Outcome: Progressing   Problem: Clinical Measurements: Goal: Postoperative complications will be avoided or minimized Outcome: Progressing   Problem: Pain Management: Goal: Pain level will decrease with appropriate interventions Outcome: Progressing

## 2018-06-10 NOTE — Plan of Care (Signed)
  Problem: Education: Goal: Knowledge of General Education information will improve Description Including pain rating scale, medication(s)/side effects and non-pharmacologic comfort measures Outcome: Progressing Note:  POC and orders reviewed with pt.   

## 2018-06-10 NOTE — Progress Notes (Signed)
Physical Therapy Treatment Patient Details Name: Andrew [name] MRN: [id number] DOB: 1956-05-12 Today's Date: 06/10/2018    History of Present Illness 62yo male who received R TKA on 06/09/18. 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 Verbally reviewed and practiced total knee HEP, patient able to perform well and with correct form without difficulty. Continued gait training approximately 519f with RW and S, overall gait mechanics improved but required cues to reduce heavy B UE support on RW this afternoon. Able to perform transfers with S and RW. Declined additional stair training as he reports he feels very comfortable with this. He was left up in the chair with all needs met and questions/concerns addressed this afternoon.     Follow Up Recommendations  Follow surgeon's recommendation for DC plan and follow-up therapies     Equipment Recommendations  Other (comment)(tub bench, otherwise has all necessary DME )    Recommendations for Other Services       Precautions / Restrictions Precautions Precautions: None Restrictions Weight Bearing Restrictions: Yes RLE Weight Bearing: Weight bearing as tolerated    Mobility  Bed Mobility               General bed mobility comments: OOB in chair   Transfers Overall transfer level: Needs assistance Equipment used: Rolling walker (2 wheeled) Transfers: Sit to/from Stand Sit to Stand: Supervision         General transfer comment: S for safety, no physical assist given   Ambulation/Gait Ambulation/Gait assistance: Supervision Gait Distance (Feet): 500 Feet Assistive device: Rolling walker (2 wheeled) Gait Pattern/deviations: Step-through pattern;Decreased dorsiflexion - right;Decreased dorsiflexion - left Gait velocity: decreased    General Gait Details: improved gait sequencing and  pattern today, cues to reduce UE pressure on RW during gait    Stairs             Wheelchair Mobility    Modified Rankin (Stroke Patients Only)       Balance Overall balance assessment: Needs assistance Sitting-balance support: No upper extremity supported;Feet supported Sitting balance-Leahy Scale: Normal     Standing balance support: No upper extremity supported;During functional activity Standing balance-Leahy Scale: Fair Standing balance comment: able to maintain static stance without UE support and close S for safety                             Cognition Arousal/Alertness: Awake/alert Behavior During Therapy: WFL for tasks assessed/performed Overall Cognitive Status: Within Functional Limits for tasks assessed                                        Exercises      General Comments        Pertinent Vitals/Pain Pain Assessment: No/denies pain    Home Living                      Prior Function            PT Goals (current goals can now be found in the care plan section) Acute Rehab PT Goals Patient Stated Goal: go home, transition to next stage of PT  PT Goal Formulation: With patient Time For Goal Achievement: 06/24/18 Potential  to Achieve Goals: Good Progress towards PT goals: Progressing toward goals    Frequency    7X/week      PT Plan Current plan remains appropriate    Co-evaluation              AM-PAC PT "6 Clicks" Mobility   Outcome Measure  Help needed turning from your back to your side while in a flat bed without using bedrails?: None Help needed moving from lying on your back to sitting on the side of a flat bed without using bedrails?: None Help needed moving to and from a bed to a chair (including a wheelchair)?: None Help needed standing up from a chair using your arms (e.g., wheelchair or bedside chair)?: A Little Help needed to walk in hospital room?: A Little Help needed  climbing 3-5 steps with a railing? : A Little 6 Click Score: 21    End of Session   Activity Tolerance: Patient tolerated treatment well Patient left: in chair;with call bell/phone within reach   PT Visit Diagnosis: Difficulty in walking, not elsewhere classified (R26.2)     Time: 9978-7765 PT Time Calculation (min) (ACUTE ONLY): 23 min  Charges:  $Gait Training: 8-22 mins $Therapeutic Exercise: 8-22 mins                     Deniece Ree PT, DPT, CBIS  Supplemental Physical Therapist [place]    Pager [phone] Acute Rehab Office [phone]

## 2018-06-11 ENCOUNTER — Telehealth (INDEPENDENT_AMBULATORY_CARE_PROVIDER_SITE_OTHER): Payer: Self-pay | Admitting: Orthopaedic Surgery

## 2018-06-11 DIAGNOSIS — I129 Hypertensive chronic kidney disease with stage 1 through stage 4 chronic kidney disease, or unspecified chronic kidney disease: Secondary | ICD-10-CM | POA: Diagnosis not present

## 2018-06-11 DIAGNOSIS — E7849 Other hyperlipidemia: Secondary | ICD-10-CM | POA: Diagnosis not present

## 2018-06-11 DIAGNOSIS — K5790 Diverticulosis of intestine, part unspecified, without perforation or abscess without bleeding: Secondary | ICD-10-CM | POA: Diagnosis not present

## 2018-06-11 DIAGNOSIS — N183 Chronic kidney disease, stage 3 (moderate): Secondary | ICD-10-CM | POA: Diagnosis not present

## 2018-06-11 DIAGNOSIS — I69351 Hemiplegia and hemiparesis following cerebral infarction affecting right dominant side: Secondary | ICD-10-CM | POA: Diagnosis not present

## 2018-06-11 DIAGNOSIS — M1A09X0 Idiopathic chronic gout, multiple sites, without tophus (tophi): Secondary | ICD-10-CM | POA: Diagnosis not present

## 2018-06-11 DIAGNOSIS — Z471 Aftercare following joint replacement surgery: Secondary | ICD-10-CM | POA: Diagnosis not present

## 2018-06-11 DIAGNOSIS — E1122 Type 2 diabetes mellitus with diabetic chronic kidney disease: Secondary | ICD-10-CM | POA: Diagnosis not present

## 2018-06-11 DIAGNOSIS — E1169 Type 2 diabetes mellitus with other specified complication: Secondary | ICD-10-CM | POA: Diagnosis not present

## 2018-06-11 NOTE — Telephone Encounter (Signed)
[facility] called for PT verbal orders  2x week for 1 week 3x for 1 week 2x for 1 week  Please call asap.

## 2018-06-11 NOTE — Telephone Encounter (Signed)
I called and gave verbal ok for this pt to have home health PT eval and treat s/p right total knee and realized that this is a Dr. Erlinda Hong pt. Just sending a message to let you know that this has been done.

## 2018-06-11 NOTE — Telephone Encounter (Signed)
THANK YOU

## 2018-06-13 DIAGNOSIS — E1122 Type 2 diabetes mellitus with diabetic chronic kidney disease: Secondary | ICD-10-CM | POA: Diagnosis not present

## 2018-06-13 DIAGNOSIS — Z471 Aftercare following joint replacement surgery: Secondary | ICD-10-CM | POA: Diagnosis not present

## 2018-06-13 DIAGNOSIS — N183 Chronic kidney disease, stage 3 (moderate): Secondary | ICD-10-CM | POA: Diagnosis not present

## 2018-06-13 DIAGNOSIS — I69351 Hemiplegia and hemiparesis following cerebral infarction affecting right dominant side: Secondary | ICD-10-CM | POA: Diagnosis not present

## 2018-06-13 DIAGNOSIS — I129 Hypertensive chronic kidney disease with stage 1 through stage 4 chronic kidney disease, or unspecified chronic kidney disease: Secondary | ICD-10-CM | POA: Diagnosis not present

## 2018-06-13 DIAGNOSIS — E7849 Other hyperlipidemia: Secondary | ICD-10-CM | POA: Diagnosis not present

## 2018-06-13 DIAGNOSIS — E1169 Type 2 diabetes mellitus with other specified complication: Secondary | ICD-10-CM | POA: Diagnosis not present

## 2018-06-13 DIAGNOSIS — M1A09X0 Idiopathic chronic gout, multiple sites, without tophus (tophi): Secondary | ICD-10-CM | POA: Diagnosis not present

## 2018-06-13 DIAGNOSIS — K5790 Diverticulosis of intestine, part unspecified, without perforation or abscess without bleeding: Secondary | ICD-10-CM | POA: Diagnosis not present

## 2018-06-16 ENCOUNTER — Telehealth (INDEPENDENT_AMBULATORY_CARE_PROVIDER_SITE_OTHER): Payer: Self-pay | Admitting: Orthopaedic Surgery

## 2018-06-16 DIAGNOSIS — I69351 Hemiplegia and hemiparesis following cerebral infarction affecting right dominant side: Secondary | ICD-10-CM | POA: Diagnosis not present

## 2018-06-16 DIAGNOSIS — Z471 Aftercare following joint replacement surgery: Secondary | ICD-10-CM | POA: Diagnosis not present

## 2018-06-16 DIAGNOSIS — E1122 Type 2 diabetes mellitus with diabetic chronic kidney disease: Secondary | ICD-10-CM | POA: Diagnosis not present

## 2018-06-16 DIAGNOSIS — M1A09X0 Idiopathic chronic gout, multiple sites, without tophus (tophi): Secondary | ICD-10-CM | POA: Diagnosis not present

## 2018-06-16 DIAGNOSIS — E7849 Other hyperlipidemia: Secondary | ICD-10-CM | POA: Diagnosis not present

## 2018-06-16 DIAGNOSIS — N183 Chronic kidney disease, stage 3 (moderate): Secondary | ICD-10-CM | POA: Diagnosis not present

## 2018-06-16 DIAGNOSIS — I129 Hypertensive chronic kidney disease with stage 1 through stage 4 chronic kidney disease, or unspecified chronic kidney disease: Secondary | ICD-10-CM | POA: Diagnosis not present

## 2018-06-16 DIAGNOSIS — E1169 Type 2 diabetes mellitus with other specified complication: Secondary | ICD-10-CM | POA: Diagnosis not present

## 2018-06-16 DIAGNOSIS — K5790 Diverticulosis of intestine, part unspecified, without perforation or abscess without bleeding: Secondary | ICD-10-CM | POA: Diagnosis not present

## 2018-06-16 NOTE — Telephone Encounter (Signed)
Medication Refill  Oxycodone

## 2018-06-16 NOTE — Telephone Encounter (Signed)
Please advise 

## 2018-06-16 NOTE — Telephone Encounter (Signed)
30

## 2018-06-17 ENCOUNTER — Telehealth (INDEPENDENT_AMBULATORY_CARE_PROVIDER_SITE_OTHER): Payer: Self-pay | Admitting: Orthopaedic Surgery

## 2018-06-17 MED ORDER — OXYCODONE HCL 5 MG PO TABS: 5.0000 mg | ORAL_TABLET | ORAL | 0 refills | Status: DC | PRN

## 2018-06-17 NOTE — Telephone Encounter (Signed)
Script printed and waiting for Dr. Erlinda Hong signature

## 2018-06-17 NOTE — Telephone Encounter (Signed)
Spoke with patient he advised his son Henreitta Leber will pick up his Rx. He will show ID

## 2018-06-17 NOTE — Telephone Encounter (Signed)
Patient aware Rx ready at front desk  

## 2018-06-18 DIAGNOSIS — K5790 Diverticulosis of intestine, part unspecified, without perforation or abscess without bleeding: Secondary | ICD-10-CM | POA: Diagnosis not present

## 2018-06-18 DIAGNOSIS — I69351 Hemiplegia and hemiparesis following cerebral infarction affecting right dominant side: Secondary | ICD-10-CM | POA: Diagnosis not present

## 2018-06-18 DIAGNOSIS — E1122 Type 2 diabetes mellitus with diabetic chronic kidney disease: Secondary | ICD-10-CM | POA: Diagnosis not present

## 2018-06-18 DIAGNOSIS — I129 Hypertensive chronic kidney disease with stage 1 through stage 4 chronic kidney disease, or unspecified chronic kidney disease: Secondary | ICD-10-CM | POA: Diagnosis not present

## 2018-06-18 DIAGNOSIS — E1169 Type 2 diabetes mellitus with other specified complication: Secondary | ICD-10-CM | POA: Diagnosis not present

## 2018-06-18 DIAGNOSIS — E7849 Other hyperlipidemia: Secondary | ICD-10-CM | POA: Diagnosis not present

## 2018-06-18 DIAGNOSIS — M1A09X0 Idiopathic chronic gout, multiple sites, without tophus (tophi): Secondary | ICD-10-CM | POA: Diagnosis not present

## 2018-06-18 DIAGNOSIS — N183 Chronic kidney disease, stage 3 (moderate): Secondary | ICD-10-CM | POA: Diagnosis not present

## 2018-06-18 DIAGNOSIS — Z471 Aftercare following joint replacement surgery: Secondary | ICD-10-CM | POA: Diagnosis not present

## 2018-06-20 ENCOUNTER — Other Ambulatory Visit: Payer: Self-pay | Admitting: Family Medicine

## 2018-06-20 DIAGNOSIS — M1A09X0 Idiopathic chronic gout, multiple sites, without tophus (tophi): Secondary | ICD-10-CM | POA: Diagnosis not present

## 2018-06-20 DIAGNOSIS — N182 Chronic kidney disease, stage 2 (mild): Principal | ICD-10-CM

## 2018-06-20 DIAGNOSIS — I129 Hypertensive chronic kidney disease with stage 1 through stage 4 chronic kidney disease, or unspecified chronic kidney disease: Secondary | ICD-10-CM | POA: Diagnosis not present

## 2018-06-20 DIAGNOSIS — K5790 Diverticulosis of intestine, part unspecified, without perforation or abscess without bleeding: Secondary | ICD-10-CM | POA: Diagnosis not present

## 2018-06-20 DIAGNOSIS — E7849 Other hyperlipidemia: Secondary | ICD-10-CM | POA: Diagnosis not present

## 2018-06-20 DIAGNOSIS — E1169 Type 2 diabetes mellitus with other specified complication: Secondary | ICD-10-CM | POA: Diagnosis not present

## 2018-06-20 DIAGNOSIS — Z471 Aftercare following joint replacement surgery: Secondary | ICD-10-CM | POA: Diagnosis not present

## 2018-06-20 DIAGNOSIS — E1122 Type 2 diabetes mellitus with diabetic chronic kidney disease: Secondary | ICD-10-CM | POA: Diagnosis not present

## 2018-06-20 DIAGNOSIS — I69351 Hemiplegia and hemiparesis following cerebral infarction affecting right dominant side: Secondary | ICD-10-CM | POA: Diagnosis not present

## 2018-06-20 DIAGNOSIS — N183 Chronic kidney disease, stage 3 (moderate): Secondary | ICD-10-CM | POA: Diagnosis not present

## 2018-06-23 DIAGNOSIS — N183 Chronic kidney disease, stage 3 (moderate): Secondary | ICD-10-CM | POA: Diagnosis not present

## 2018-06-23 DIAGNOSIS — E1169 Type 2 diabetes mellitus with other specified complication: Secondary | ICD-10-CM | POA: Diagnosis not present

## 2018-06-23 DIAGNOSIS — M1A09X0 Idiopathic chronic gout, multiple sites, without tophus (tophi): Secondary | ICD-10-CM | POA: Diagnosis not present

## 2018-06-23 DIAGNOSIS — E7849 Other hyperlipidemia: Secondary | ICD-10-CM | POA: Diagnosis not present

## 2018-06-23 DIAGNOSIS — Z471 Aftercare following joint replacement surgery: Secondary | ICD-10-CM | POA: Diagnosis not present

## 2018-06-23 DIAGNOSIS — I69351 Hemiplegia and hemiparesis following cerebral infarction affecting right dominant side: Secondary | ICD-10-CM | POA: Diagnosis not present

## 2018-06-23 DIAGNOSIS — K5790 Diverticulosis of intestine, part unspecified, without perforation or abscess without bleeding: Secondary | ICD-10-CM | POA: Diagnosis not present

## 2018-06-23 DIAGNOSIS — E1122 Type 2 diabetes mellitus with diabetic chronic kidney disease: Secondary | ICD-10-CM | POA: Diagnosis not present

## 2018-06-23 DIAGNOSIS — I129 Hypertensive chronic kidney disease with stage 1 through stage 4 chronic kidney disease, or unspecified chronic kidney disease: Secondary | ICD-10-CM | POA: Diagnosis not present

## 2018-06-25 ENCOUNTER — Ambulatory Visit (INDEPENDENT_AMBULATORY_CARE_PROVIDER_SITE_OTHER): Payer: Medicare HMO | Admitting: Orthopaedic Surgery

## 2018-06-25 ENCOUNTER — Encounter: Payer: Self-pay | Admitting: Family Medicine

## 2018-06-25 ENCOUNTER — Other Ambulatory Visit: Payer: Self-pay

## 2018-06-25 ENCOUNTER — Ambulatory Visit (INDEPENDENT_AMBULATORY_CARE_PROVIDER_SITE_OTHER): Payer: Medicare HMO | Admitting: Family Medicine

## 2018-06-25 ENCOUNTER — Encounter (INDEPENDENT_AMBULATORY_CARE_PROVIDER_SITE_OTHER): Payer: Self-pay | Admitting: Orthopaedic Surgery

## 2018-06-25 VITALS — BP 130/54 | HR 83 | Temp 98.6°F | Ht 73.0 in | Wt 196.2 lb

## 2018-06-25 DIAGNOSIS — E1169 Type 2 diabetes mellitus with other specified complication: Secondary | ICD-10-CM | POA: Diagnosis not present

## 2018-06-25 DIAGNOSIS — E785 Hyperlipidemia, unspecified: Secondary | ICD-10-CM

## 2018-06-25 DIAGNOSIS — E1122 Type 2 diabetes mellitus with diabetic chronic kidney disease: Secondary | ICD-10-CM | POA: Diagnosis not present

## 2018-06-25 DIAGNOSIS — N182 Chronic kidney disease, stage 2 (mild): Secondary | ICD-10-CM

## 2018-06-25 DIAGNOSIS — Z96651 Presence of right artificial knee joint: Secondary | ICD-10-CM

## 2018-06-25 LAB — POCT GLYCOSYLATED HEMOGLOBIN (HGB A1C): HbA1c, POC (controlled diabetic range): 6.5 % (ref 0.0–7.0)

## 2018-06-25 MED ORDER — TRAMADOL HCL 50 MG PO TABS: 50.0000 mg | ORAL_TABLET | Freq: Three times a day (TID) | ORAL | 2 refills | Status: DC | PRN

## 2018-06-25 NOTE — Progress Notes (Signed)
    Subjective:  Andrew [name] is a 62 y.o. male who presents to the [facility] today for diabetes follow up  HPI:  Patient states that he has been taking metformin 1000 mg twice daily, compliant, tolerating well.  He has had no side effects.  He has been checking his sugars regularly especially after his recent right total knee replacement.  He has been doing well postop.  He thinks that due to the stress of the surgery and some of the postop medications his sugars have been a little bit uncontrolled recently.  The highest number he seen has been in the low 200s.  The number he is seeing most often is in the low 130s.  He has had no hypoglycemic events.  He has no polyuria, polydipsia, vision changes  ROS: Per HPI  Objective:  Physical Exam: BP (!) 130/54   Pulse 83   Temp 98.6 F (37 C) (Oral)   Ht 6\' 1"  (1.854 m)   Wt 196 lb 4 oz (89 kg)   SpO2 99%   BMI 25.89 kg/m   Gen: NAD, resting comfortably CV: RRR with no murmurs appreciated Pulm: NWOB, CTAB with no crackles, wheezes, or rhonchi GI: Normal bowel sounds present. Soft, Nontender, Nondistended. MSK: Right knee with incision clean dry and intact.  Local edema. Skin: warm, dry Neuro: grossly normal, moves all extremities.  Walks with a walker Psych: Normal affect and thought content  Results for orders placed or performed in visit on 06/25/18 (from the past 72 hour(s))  HgB A1c     Status: None   Collection Time: 06/25/18 10:27 AM  Result Value Ref Range   Hemoglobin A1C     HbA1c POC (<> result, manual entry)     HbA1c, POC (prediabetic range)     HbA1c, POC (controlled diabetic range) 6.5 0.0 - 7.0 %     Assessment/Plan:  Controlled diabetes mellitus ([place]) Stable and at goal.  Doing well.  Continue metformin 1000 mg twice daily.  Follow-up in 3 months   Bufford Lope, DO PGY-3, [facility] 06/25/2018 10:48 AM

## 2018-06-25 NOTE — Progress Notes (Signed)
Post-Op Visit Note   Patient: Andrew [name]           Date of Birth: [birth year]           MRN: [id number] Visit Date: 06/25/2018 PCP: Bufford Lope, DO   Assessment & Plan:  Chief Complaint:  Chief Complaint  Patient presents with  . Right Knee - Pain   Visit Diagnoses:  1. Status post right knee replacement     Plan: Mr. Aceituno is two-week status post right total knee replacement.  He is doing well.  He is progressing with physical therapy.  Surgical incision is clean dry and intact without any evidence of infection.  He has mild expected postoperative swelling.  Neurovascular intact.  No calf tenderness.  Range of motion is 5 to 90 degrees.  Today we applied Steri-Strips and gave him a referral to outpatient physical therapy.  I refilled his tramadol.  Recheck in 4 weeks with three-view x-rays of the right knee.  Follow-Up Instructions: Return in about 4 weeks (around 07/23/2018).   Orders:  No orders of the defined types were placed in this encounter.  No orders of the defined types were placed in this encounter.   Imaging: No results found.  PMFS History: Patient Active Problem List   Diagnosis Date Noted  . Primary osteoarthritis of right knee 05/21/2018  . Chronic left shoulder pain 09/20/2017  . Total knee replacement status 05/15/2017  . Chronic gout of multiple sites 05/02/2017  . Healthcare maintenance 04/04/2016  . Osteoarthritis of both knees 12/26/2015  . History of stroke 09/02/2014  . Hypothyroidism 05/25/2013  . Personal history of colonic adenomas 04/01/2013  . Hyperlipidemia associated with type 2 diabetes mellitus ([place]) 02/20/2007  . Controlled diabetes mellitus ([employer]) 10/18/2006  . ERECTILE DYSFUNCTION 10/18/2006  . 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 Essential hypertension 10/18/2006  . CKD (chronic kidney disease) stage 3, GFR 30-59 ml/min ([place]) 09/05/2006   Past Medical History:  Diagnosis Date  . Arthritis   .  CRD (chronic renal disease), stage II    Baseline Cr 1.2  . CVA (cerebral vascular accident) ([place])    x4 last one in 2007, R sided residual weakness  . HAMMER TOE 07/25/2010  . HLD (hyperlipidemia)   . HTN (hypertension)   . Hypothyroidism   . Leg swelling   . Lipoma of back 03/18/2011  . Personal history of colonic adenomas 04/01/2013  . Prostatitis    hx of x2  . PSEUDOFOLLICULITIS BARBAE 12/14/1273   Qualifier: Diagnosis of  By: Danise Mina  MD, Garlon Hatchet    . Sarcoidosis   . T2DM (type 2 diabetes mellitus) ([place])     Family History  Problem Relation Age of Onset  . Bone cancer Father   . Cancer Father 17       bone  . Uterine cancer Mother   . Cancer Mother   . Heart attack Brother   . Kidney disease Brother   . Diabetes Brother   . Arthritis Brother   . Heart disease Sister   . Congestive Heart Failure Sister   . Congestive Heart Failure Sister   . Congestive Heart Failure Sister   . Cancer Sister   . Diabetes Sister   . Arthritis Sister   . Breast cancer Sister   . Colon cancer Neg Hx   . Rectal cancer Neg Hx   . Stomach cancer Neg Hx   . Colon polyps Neg Hx   . Esophageal cancer  Neg Hx     Past Surgical History:  Procedure Laterality Date  . COLONOSCOPY  03/24/2013  . COLONOSCOPY W/ POLYPECTOMY    . ERCP  2006   Normal  . ESOPHAGOGASTRODUODENOSCOPY    . EYE SURGERY Bilateral    cataracts  . [place]   x2 'inguinal  . LIPOMA EXCISION  06/22/2011   Procedure: EXCISION LIPOMA;  Surgeon: Rolm Bookbinder, MD;  Location: [facility] [place];  Service: General;  Laterality: N/A;  . TOOTH EXTRACTION    . TOTAL KNEE ARTHROPLASTY Left 05/15/2017   Procedure: LEFT TOTAL KNEE ARTHROPLASTY;  Surgeon: Leandrew Koyanagi, MD;  Location: [place] [facility] [place];  Service: Orthopedics;  Laterality: Left;  . TOTAL KNEE ARTHROPLASTY Right 06/09/2018   Procedure: RIGHT TOTAL KNEE ARTHROPLASTY;  Surgeon: Leandrew Koyanagi, MD;  Location: [facility];  Service: Orthopedics;  Laterality: Right;   . TRANSTHORACIC ECHOCARDIOGRAM  2005   EF 60%   Social History   Occupational History  . 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 Smoking status: Never Smoker  . Smokeless tobacco: Never Used  . Tobacco comment: Never smoker; no plans to start  Substance and Sexual Activity  . Alcohol use: No  . Drug use: No  . Sexual activity: Not Currently

## 2018-06-25 NOTE — Assessment & Plan Note (Signed)
Stable and at goal.  Doing well.  Continue metformin 1000 mg twice daily.  Follow-up in 3 months

## 2018-06-25 NOTE — Patient Instructions (Addendum)
Excellent job on your diabetes control.   Continue metformin 1000mg  twice a day.  Glad to hear you are doing well after your surgery. If Dr. Erlinda Hong does not fill out handicap placards I am happy to do that for you.

## 2018-06-26 DIAGNOSIS — E1169 Type 2 diabetes mellitus with other specified complication: Secondary | ICD-10-CM | POA: Diagnosis not present

## 2018-06-26 DIAGNOSIS — I129 Hypertensive chronic kidney disease with stage 1 through stage 4 chronic kidney disease, or unspecified chronic kidney disease: Secondary | ICD-10-CM | POA: Diagnosis not present

## 2018-06-26 DIAGNOSIS — N183 Chronic kidney disease, stage 3 (moderate): Secondary | ICD-10-CM | POA: Diagnosis not present

## 2018-06-26 DIAGNOSIS — E1122 Type 2 diabetes mellitus with diabetic chronic kidney disease: Secondary | ICD-10-CM | POA: Diagnosis not present

## 2018-06-26 DIAGNOSIS — E7849 Other hyperlipidemia: Secondary | ICD-10-CM | POA: Diagnosis not present

## 2018-06-26 DIAGNOSIS — Z471 Aftercare following joint replacement surgery: Secondary | ICD-10-CM | POA: Diagnosis not present

## 2018-06-26 DIAGNOSIS — K5790 Diverticulosis of intestine, part unspecified, without perforation or abscess without bleeding: Secondary | ICD-10-CM | POA: Diagnosis not present

## 2018-06-26 DIAGNOSIS — M1A09X0 Idiopathic chronic gout, multiple sites, without tophus (tophi): Secondary | ICD-10-CM | POA: Diagnosis not present

## 2018-06-26 DIAGNOSIS — I69351 Hemiplegia and hemiparesis following cerebral infarction affecting right dominant side: Secondary | ICD-10-CM | POA: Diagnosis not present

## 2018-07-07 DIAGNOSIS — R262 Difficulty in walking, not elsewhere classified: Secondary | ICD-10-CM | POA: Diagnosis not present

## 2018-07-07 DIAGNOSIS — M25561 Pain in right knee: Secondary | ICD-10-CM | POA: Diagnosis not present

## 2018-07-07 DIAGNOSIS — M25661 Stiffness of right knee, not elsewhere classified: Secondary | ICD-10-CM | POA: Diagnosis not present

## 2018-07-07 DIAGNOSIS — M6281 Muscle weakness (generalized): Secondary | ICD-10-CM | POA: Diagnosis not present

## 2018-07-11 DIAGNOSIS — R262 Difficulty in walking, not elsewhere classified: Secondary | ICD-10-CM | POA: Diagnosis not present

## 2018-07-11 DIAGNOSIS — M6281 Muscle weakness (generalized): Secondary | ICD-10-CM | POA: Diagnosis not present

## 2018-07-11 DIAGNOSIS — M25561 Pain in right knee: Secondary | ICD-10-CM | POA: Diagnosis not present

## 2018-07-11 DIAGNOSIS — M25661 Stiffness of right knee, not elsewhere classified: Secondary | ICD-10-CM | POA: Diagnosis not present

## 2018-07-14 ENCOUNTER — Telehealth: Payer: Self-pay | Admitting: *Deleted

## 2018-07-14 DIAGNOSIS — M25561 Pain in right knee: Secondary | ICD-10-CM | POA: Diagnosis not present

## 2018-07-14 DIAGNOSIS — R262 Difficulty in walking, not elsewhere classified: Secondary | ICD-10-CM | POA: Diagnosis not present

## 2018-07-14 DIAGNOSIS — M25661 Stiffness of right knee, not elsewhere classified: Secondary | ICD-10-CM | POA: Diagnosis not present

## 2018-07-14 DIAGNOSIS — M6281 Muscle weakness (generalized): Secondary | ICD-10-CM | POA: Diagnosis not present

## 2018-07-14 NOTE — Telephone Encounter (Signed)
Pt calls because his CBGs have been dropping into the 80s but he is able to "get it back up with apple juice and raisins"  He is requesting an appt with Dr. Shawna Orleans.  First available is Friday.  Pt is ok with that and is aware of signs that he needs to call EMS or seek medical care.  Also, he has been taking 500mg  of metformin BID instead of 1000mg  for the last couple days and is still having the drops in readings.  He will bring reading with him to appt on Friday. [place], Salome Spotted, CMA

## 2018-07-15 NOTE — Telephone Encounter (Signed)
Called patient, he self titrated further to 500mg q am 2 days ago. Thinks is because he lost weight and is now 186lbs. He did this because he will check his sugars when he feels sleepy and has noticed CBG down to 80-70s. No shakes or nausea. He will continue this for the next few days until he sees me on 07/18/17.

## 2018-07-18 ENCOUNTER — Other Ambulatory Visit: Payer: Self-pay

## 2018-07-18 ENCOUNTER — Ambulatory Visit (INDEPENDENT_AMBULATORY_CARE_PROVIDER_SITE_OTHER): Payer: Medicare HMO | Admitting: Family Medicine

## 2018-07-18 ENCOUNTER — Encounter: Payer: Self-pay | Admitting: Family Medicine

## 2018-07-18 DIAGNOSIS — N182 Chronic kidney disease, stage 2 (mild): Secondary | ICD-10-CM

## 2018-07-18 DIAGNOSIS — E1122 Type 2 diabetes mellitus with diabetic chronic kidney disease: Secondary | ICD-10-CM | POA: Diagnosis not present

## 2018-07-18 MED ORDER — METFORMIN HCL 500 MG PO TABS: 500.0000 mg | ORAL_TABLET | Freq: Every day | ORAL | Status: DC

## 2018-07-18 NOTE — Assessment & Plan Note (Signed)
Based on personal review of patient home glucometer readings he has only had occasional readings under 90 since decreasing his metformin.  His home readings are more in the 120s to 140s which are appropriate.  Discussed with patient that he likely had increased caloric demands after his surgery and that continued good blood sugar control is important for healing.  He agreed to stay on metformin 500 mg daily and follow-up with me in 3 months.

## 2018-07-18 NOTE — Progress Notes (Signed)
    Subjective:  Andrew [name] is a 63 y.o. male who presents to the [facility] today for diabetes follow-up  HPI:  SUBJECTIVE: 63 y.o. male for follow up of diabetes.  medication patient has decreased to metformin 500 mg daily.  He did this because he felt that he was hypoglycemic.  His symptoms were that he was feeling tired, and then he would check his blood sugar and it would be down to the 80s sometimes.  So he would eat candy or raisins.  No shaking or fainting. He brought his home, glucometer with him today.  It showing an average of 140s.  Ranging from 82 to 202.  He has been healing well from his recent knee surgery.  He  diabetic diet compliance: compliant most of the time, home glucose monitoring: is performed regularly, further diabetic   ROS: no polyuria or polydipsia, no chest pain, dyspnea or TIA's, no numbness, tingling or pain in extremities, weight has decreased.    Objective:  Physical Exam: BP 110/60   Pulse 88   Temp (!) 97.5 F (36.4 C) (Oral)   Wt 191 lb (86.6 kg)   SpO2 97%   BMI 25.20 kg/m   Gen: NAD, resting comfortably Pulm: NWOB Neuro: grossly normal, moves all extremities Psych: Normal affect and thought content   Assessment/Plan:  Controlled diabetes mellitus ([place]) Based on personal review of patient home glucometer readings he has only had occasional readings under 90 since decreasing his metformin.  His home readings are more in the 120s to 140s which are appropriate.  Discussed with patient that he likely had increased caloric demands after his surgery and that continued good blood sugar control is important for healing.  He agreed to stay on metformin 500 mg daily and follow-up with me in 3 months.   Bufford Lope, DO PGY-3, [facility] 07/18/2018 10:38 AM

## 2018-07-18 NOTE — Patient Instructions (Signed)
Stay on metformin 500mg  daily.    See you back in March

## 2018-07-23 ENCOUNTER — Encounter (INDEPENDENT_AMBULATORY_CARE_PROVIDER_SITE_OTHER): Payer: Self-pay | Admitting: Orthopaedic Surgery

## 2018-07-23 ENCOUNTER — Ambulatory Visit (INDEPENDENT_AMBULATORY_CARE_PROVIDER_SITE_OTHER): Payer: Medicare HMO | Admitting: Physician Assistant

## 2018-07-23 ENCOUNTER — Ambulatory Visit (INDEPENDENT_AMBULATORY_CARE_PROVIDER_SITE_OTHER): Payer: Medicare HMO

## 2018-07-23 DIAGNOSIS — Z96651 Presence of right artificial knee joint: Secondary | ICD-10-CM

## 2018-07-23 NOTE — Progress Notes (Signed)
Post-Op Visit Note   Patient: Andrew [name]           Date of Birth: 03-10-1956           MRN: [id number] Visit Date: 07/23/2018 PCP: Bufford Lope, DO   Assessment & Plan:  Chief Complaint:  Chief Complaint  Patient presents with  . Right Knee - Pain, Follow-up, Routine Post Op   Visit Diagnoses:  1. Status post right knee replacement     Plan: Patient is a pleasant 63 year old gentleman who presents our clinic today 44 days status post right total knee replacement, date of surgery 06/09/2018.  He has been doing very well over the past several weeks.  His pain has dramatically improved over the past few days.  He has been in formal physical therapy making improvement in range of motion and strengthening.  No fevers or chills.  Examination of the right knee shows a well-healed surgical incision without some infection.  He does have moderate effusion.  Calf is soft and nontender.  Range of motion 0 to 95 degrees.  Stable to valgus varus stress.  Neurovascularly intact distally.  At this point, like for him to continue with physical therapy to really push it with range of motion and strength.  He will continue to his home exercise program as well.  He will follow-up with [place] in 6 weeks time for repeat evaluation.  Call with concerns or questions in the meantime.  Follow-Up Instructions: Return in about 6 weeks (around 09/03/2018).   Orders:  Orders Placed This Encounter  Procedures  . XR KNEE 3 VIEW RIGHT   No orders of the defined types were placed in this encounter.   Imaging: Xr Knee 3 View Right  Result Date: 07/23/2018 X-rays demonstrate a well-seated prosthesis without evidence of subsidence or ostial lysis   PMFS History: Patient Active Problem List   Diagnosis Date Noted  . Primary osteoarthritis of right knee 05/21/2018  . Chronic left shoulder pain 09/20/2017  . Total knee replacement status 05/15/2017  . Chronic gout of multiple sites 05/02/2017  . Healthcare  maintenance 04/04/2016  . Osteoarthritis of both knees 12/26/2015  . History of stroke 09/02/2014  . Hypothyroidism 05/25/2013  . Personal history of colonic adenomas 04/01/2013  . Hyperlipidemia associated with type 2 diabetes mellitus ([place]) 02/20/2007  . Controlled diabetes mellitus ([place]) 10/18/2006  . ERECTILE DYSFUNCTION 10/18/2006  . 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 Essential hypertension 10/18/2006  . CKD (chronic kidney disease) stage 3, GFR 30-59 ml/min ([place]) 09/05/2006   Past Medical History:  Diagnosis Date  . Arthritis   . CRD (chronic renal disease), stage II    Baseline Cr 1.2  . CVA (cerebral vascular accident) ([place])    x4 last one in 2007, R sided residual weakness  . HAMMER TOE 07/25/2010  . HLD (hyperlipidemia)   . HTN (hypertension)   . Hypothyroidism   . Leg swelling   . Lipoma of back 03/18/2011  . Personal history of colonic adenomas 04/01/2013  . Prostatitis    hx of x2  . PSEUDOFOLLICULITIS BARBAE 07/12/4313   Qualifier: Diagnosis of  By: Danise Mina  MD, Garlon Hatchet    . Sarcoidosis   . T2DM (type 2 diabetes mellitus) ([place])     Family History  Problem Relation Age of Onset  . Bone cancer Father   . Cancer Father 50       bone  . Uterine cancer Mother   . Cancer Mother   .  Heart attack Brother   . Kidney disease Brother   . Diabetes Brother   . Arthritis Brother   . Heart disease Sister   . Congestive Heart Failure Sister   . Congestive Heart Failure Sister   . Congestive Heart Failure Sister   . Cancer Sister   . Diabetes Sister   . Arthritis Sister   . Breast cancer Sister   . Colon cancer Neg Hx   . Rectal cancer Neg Hx   . Stomach cancer Neg Hx   . Colon polyps Neg Hx   . Esophageal cancer Neg Hx     Past Surgical History:  Procedure Laterality Date  . COLONOSCOPY  03/24/2013  . COLONOSCOPY W/ POLYPECTOMY    . ERCP  2006   Normal  . ESOPHAGOGASTRODUODENOSCOPY    . EYE SURGERY Bilateral    cataracts  . [place]   x2 'inguinal  . LIPOMA EXCISION  06/22/2011   Procedure: EXCISION LIPOMA;  Surgeon: Rolm Bookbinder, MD;  Location: [facility];  Service: General;  Laterality: N/A;  . TOOTH EXTRACTION    . TOTAL KNEE ARTHROPLASTY Left 05/15/2017   Procedure: LEFT TOTAL KNEE ARTHROPLASTY;  Surgeon: Leandrew Koyanagi, MD;  Location: [place];  Service: Orthopedics;  Laterality: Left;  . TOTAL KNEE ARTHROPLASTY Right 06/09/2018   Procedure: RIGHT TOTAL KNEE ARTHROPLASTY;  Surgeon: Leandrew Koyanagi, MD;  Location: [place];  Service: Orthopedics;  Laterality: Right;  . TRANSTHORACIC ECHOCARDIOGRAM  2005   EF 60%   Social History   Occupational History  . 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 Smoking status: Never Smoker  . Smokeless tobacco: Never Used  . Tobacco comment: Never smoker; no plans to start  Substance and Sexual Activity  . Alcohol use: No  . Drug use: No  . Sexual activity: Not Currently

## 2018-07-28 DIAGNOSIS — M25661 Stiffness of right knee, not elsewhere classified: Secondary | ICD-10-CM | POA: Diagnosis not present

## 2018-07-28 DIAGNOSIS — R262 Difficulty in walking, not elsewhere classified: Secondary | ICD-10-CM | POA: Diagnosis not present

## 2018-07-28 DIAGNOSIS — M6281 Muscle weakness (generalized): Secondary | ICD-10-CM | POA: Diagnosis not present

## 2018-07-28 DIAGNOSIS — M25561 Pain in right knee: Secondary | ICD-10-CM | POA: Diagnosis not present

## 2018-08-01 ENCOUNTER — Other Ambulatory Visit: Payer: Self-pay

## 2018-08-01 DIAGNOSIS — N182 Chronic kidney disease, stage 2 (mild): Principal | ICD-10-CM

## 2018-08-01 DIAGNOSIS — E1122 Type 2 diabetes mellitus with diabetic chronic kidney disease: Secondary | ICD-10-CM

## 2018-08-01 MED ORDER — METFORMIN HCL 500 MG PO TABS: 500.0000 mg | ORAL_TABLET | Freq: Every day | ORAL | 0 refills | Status: DC

## 2018-08-01 NOTE — Telephone Encounter (Signed)
Patient request one month supply of Metformin to local pharmacy. Was late in asking for refill from [facility].  Danley Danker, RN [facility] RN)

## 2018-08-03 ENCOUNTER — Other Ambulatory Visit (INDEPENDENT_AMBULATORY_CARE_PROVIDER_SITE_OTHER): Payer: Self-pay | Admitting: Orthopaedic Surgery

## 2018-08-04 ENCOUNTER — Telehealth: Payer: Self-pay | Admitting: *Deleted

## 2018-08-04 DIAGNOSIS — E1122 Type 2 diabetes mellitus with diabetic chronic kidney disease: Secondary | ICD-10-CM

## 2018-08-04 DIAGNOSIS — R262 Difficulty in walking, not elsewhere classified: Secondary | ICD-10-CM | POA: Diagnosis not present

## 2018-08-04 DIAGNOSIS — M25661 Stiffness of right knee, not elsewhere classified: Secondary | ICD-10-CM | POA: Diagnosis not present

## 2018-08-04 DIAGNOSIS — N182 Chronic kidney disease, stage 2 (mild): Principal | ICD-10-CM

## 2018-08-04 DIAGNOSIS — M25561 Pain in right knee: Secondary | ICD-10-CM | POA: Diagnosis not present

## 2018-08-04 DIAGNOSIS — M6281 Muscle weakness (generalized): Secondary | ICD-10-CM | POA: Diagnosis not present

## 2018-08-04 MED ORDER — INSULIN PEN NEEDLE 32G X 6 MM MISC: 3 refills | Status: DC

## 2018-08-04 NOTE — Telephone Encounter (Signed)
Spoke with patient. He has not been able to tolerate metformin. He would like to restart victoza as he tolerated that well and now can afford it. Discussed restart at 0.6mg  daily for 1 week then increase to 1.2mg  daily. Needles sent. Patient voiced good understanding.

## 2018-08-04 NOTE — Telephone Encounter (Signed)
Pt states that he would like to go back on metformin because of the side effects.  He does not like the diarrhea effect and was reading the insert and is concerned about damage to his kidneys.  He would like the provider to call him to discuss changing back to [place].  FYI: he will not need a refill of victoza, but will need a refill of pen needles (he is requesting the 32 gauge).  He will not change meds until speaking with Dr. Shawna Orleans. Vasco Chong, Salome Spotted, CMA

## 2018-08-05 ENCOUNTER — Other Ambulatory Visit: Payer: Self-pay

## 2018-08-05 NOTE — Telephone Encounter (Signed)
Fax refill request from [facility] for Trazodone, not on current med list.  Danley Danker, RN [facility] RN)

## 2018-08-05 NOTE — Telephone Encounter (Signed)
Per chart review, patient self weaned off this medication in June 2019. Is he interested in restarting? If so can send rx

## 2018-08-06 MED ORDER — TRAZODONE HCL 50 MG PO TABS: 50.0000 mg | ORAL_TABLET | Freq: Every evening | ORAL | 3 refills | Status: DC | PRN

## 2018-08-06 NOTE — Telephone Encounter (Signed)
Rx sent to Andrew [name]

## 2018-08-06 NOTE — Telephone Encounter (Signed)
Spoke with pt. He stated that he would like to restart Trazodone. Pt states that he is starting to have problems with falling asleep again. Informed him that Dr. Shawna Orleans stated she would send in a Rx for him if he was looking to restart medication. Salvatore Marvel, CMA

## 2018-08-12 ENCOUNTER — Telehealth (INDEPENDENT_AMBULATORY_CARE_PROVIDER_SITE_OTHER): Payer: Self-pay | Admitting: Orthopaedic Surgery

## 2018-08-12 ENCOUNTER — Other Ambulatory Visit (INDEPENDENT_AMBULATORY_CARE_PROVIDER_SITE_OTHER): Payer: Self-pay | Admitting: Physician Assistant

## 2018-08-12 MED ORDER — HYDROCODONE-ACETAMINOPHEN 5-325 MG PO TABS: 1.0000 | ORAL_TABLET | Freq: Three times a day (TID) | ORAL | 0 refills | Status: DC | PRN

## 2018-08-12 NOTE — Telephone Encounter (Signed)
Patient called stated needed something stronger than Tramadol. Iys not working. He's on his leg all day and it is really hurting during the night abd keeping him up.  Please call patient to advise,  [phone]

## 2018-08-12 NOTE — Telephone Encounter (Signed)
Thank you :)

## 2018-08-12 NOTE — Telephone Encounter (Signed)
See message.

## 2018-08-12 NOTE — Telephone Encounter (Signed)
I called this in...

## 2018-08-12 NOTE — Telephone Encounter (Signed)
States he uses  [facility]

## 2018-08-12 NOTE — Telephone Encounter (Signed)
Ok for norco 5, 1 tab q8 hours prn pain. #20.  Not sure what pharmacy to call in to

## 2018-08-18 DIAGNOSIS — R262 Difficulty in walking, not elsewhere classified: Secondary | ICD-10-CM | POA: Diagnosis not present

## 2018-08-18 DIAGNOSIS — M6281 Muscle weakness (generalized): Secondary | ICD-10-CM | POA: Diagnosis not present

## 2018-08-18 DIAGNOSIS — M25661 Stiffness of right knee, not elsewhere classified: Secondary | ICD-10-CM | POA: Diagnosis not present

## 2018-08-18 DIAGNOSIS — M25561 Pain in right knee: Secondary | ICD-10-CM | POA: Diagnosis not present

## 2018-08-24 ENCOUNTER — Other Ambulatory Visit: Payer: Self-pay | Admitting: Family Medicine

## 2018-08-24 DIAGNOSIS — E1122 Type 2 diabetes mellitus with diabetic chronic kidney disease: Secondary | ICD-10-CM

## 2018-08-24 DIAGNOSIS — N182 Chronic kidney disease, stage 2 (mild): Principal | ICD-10-CM

## 2018-08-25 DIAGNOSIS — M6281 Muscle weakness (generalized): Secondary | ICD-10-CM | POA: Diagnosis not present

## 2018-08-25 DIAGNOSIS — M25661 Stiffness of right knee, not elsewhere classified: Secondary | ICD-10-CM | POA: Diagnosis not present

## 2018-08-25 DIAGNOSIS — M25561 Pain in right knee: Secondary | ICD-10-CM | POA: Diagnosis not present

## 2018-08-25 DIAGNOSIS — R262 Difficulty in walking, not elsewhere classified: Secondary | ICD-10-CM | POA: Diagnosis not present

## 2018-08-25 NOTE — Telephone Encounter (Signed)
Per last phone note documentation he was not able to tolerate metformin. If he can he should certainly stay on it. Can we find out if he would like this medication refilled or if this was automatic request?

## 2018-08-26 NOTE — Telephone Encounter (Signed)
Spoke with patient and he said to not refill this medication.  He will speak with pharmacy about taking this off his list.  Andrew [name]

## 2018-08-26 NOTE — Telephone Encounter (Signed)
Any updates on patient preference with metformin?

## 2018-09-01 DIAGNOSIS — R262 Difficulty in walking, not elsewhere classified: Secondary | ICD-10-CM | POA: Diagnosis not present

## 2018-09-01 DIAGNOSIS — M25561 Pain in right knee: Secondary | ICD-10-CM | POA: Diagnosis not present

## 2018-09-01 DIAGNOSIS — M6281 Muscle weakness (generalized): Secondary | ICD-10-CM | POA: Diagnosis not present

## 2018-09-01 DIAGNOSIS — M25661 Stiffness of right knee, not elsewhere classified: Secondary | ICD-10-CM | POA: Diagnosis not present

## 2018-09-03 ENCOUNTER — Ambulatory Visit (INDEPENDENT_AMBULATORY_CARE_PROVIDER_SITE_OTHER): Payer: Medicare HMO | Admitting: Physician Assistant

## 2018-09-03 ENCOUNTER — Ambulatory Visit (INDEPENDENT_AMBULATORY_CARE_PROVIDER_SITE_OTHER): Payer: Medicare HMO

## 2018-09-03 ENCOUNTER — Encounter (INDEPENDENT_AMBULATORY_CARE_PROVIDER_SITE_OTHER): Payer: Self-pay | Admitting: Orthopaedic Surgery

## 2018-09-03 DIAGNOSIS — Z96651 Presence of right artificial knee joint: Secondary | ICD-10-CM

## 2018-09-03 NOTE — Progress Notes (Signed)
Post-Op Visit Note   Patient: Andrew [name]           Date of Birth: [birth year]           MRN: [id number] Visit Date: 09/03/2018 PCP: Bufford Lope, DO   Assessment & Plan:  Chief Complaint:  Chief Complaint  Patient presents with  . Right Knee - Follow-up   Visit Diagnoses:  1. Status post right knee replacement     Plan: Patient is a pleasant 63 year old gentleman who presents our clinic today 3 months status post right total knee replacement, date of surgery 06/09/2018.  He has been doing very well.  He is still in formal physical therapy making great progress.  He has not taking any narcotic pain medicine.  No fevers or chills.  Examination of his right knee reveals well-healed surgical incision without evidence of infection.  He does have a trace effusion.  Range of motion 0 to 120 degrees.  Calf is soft and nontender.  At this point, he would like to continue with therapy to work on strengthening.  He will follow-up with [place] in 3 months time for repeat evaluation and x-ray.  Follow-Up Instructions: Return in about 3 months (around 12/02/2018).   Orders:  Orders Placed This Encounter  Procedures  . XR KNEE 3 VIEW RIGHT   No orders of the defined types were placed in this encounter.   Imaging: Xr Knee 3 View Right  Result Date: 09/03/2018 Well-seated prosthesis with balance of subsidence or ostial lysis   PMFS History: Patient Active Problem List   Diagnosis Date Noted  . Primary osteoarthritis of right knee 05/21/2018  . Chronic left shoulder pain 09/20/2017  . 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 Chronic gout of multiple sites 05/02/2017  . Healthcare maintenance 04/04/2016  . Osteoarthritis of both knees 12/26/2015  . History of stroke 09/02/2014  . Hypothyroidism 05/25/2013  . Personal history of colonic adenomas 04/01/2013  . Hyperlipidemia associated with type 2 diabetes mellitus ([place]) 02/20/2007  . Controlled diabetes mellitus ([place])  10/18/2006  . ERECTILE DYSFUNCTION 10/18/2006  . 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 Essential hypertension 10/18/2006  . CKD (chronic kidney disease) stage 3, GFR 30-59 ml/min ([place]) 09/05/2006   Past Medical History:  Diagnosis Date  . Arthritis   . CRD (chronic renal disease), stage II    Baseline Cr 1.2  . CVA (cerebral vascular accident) ([place])    x4 last one in 2007, R sided residual weakness  . HAMMER TOE 07/25/2010  . HLD (hyperlipidemia)   . HTN (hypertension)   . Hypothyroidism   . Leg swelling   . Lipoma of back 03/18/2011  . Personal history of colonic adenomas 04/01/2013  . Prostatitis    hx of x2  . PSEUDOFOLLICULITIS BARBAE 0/0/1749   Qualifier: Diagnosis of  By: Danise Mina  MD, Garlon Hatchet    . Sarcoidosis   . T2DM (type 2 diabetes mellitus) ([place])     Family History  Problem Relation Age of Onset  . Bone cancer Father   . Cancer Father 59       bone  . Uterine cancer Mother   . Cancer Mother   . Heart attack Brother   . Kidney disease Brother   . Diabetes Brother   . Arthritis Brother   . Heart disease Sister   . Congestive Heart Failure Sister   . Congestive Heart Failure Sister   . Congestive Heart Failure Sister   .  Cancer Sister   . Diabetes Sister   . Arthritis Sister   . Breast cancer Sister   . Colon cancer Neg Hx   . Rectal cancer Neg Hx   . Stomach cancer Neg Hx   . Colon polyps Neg Hx   . Esophageal cancer Neg Hx     Past Surgical History:  Procedure Laterality Date  . COLONOSCOPY  03/24/2013  . COLONOSCOPY W/ POLYPECTOMY    . ERCP  2006   Normal  . ESOPHAGOGASTRODUODENOSCOPY    . EYE SURGERY Bilateral    cataracts  . [place]   x2 'inguinal  . LIPOMA EXCISION  06/22/2011   Procedure: EXCISION LIPOMA;  Surgeon: Rolm Bookbinder, MD;  Location: [facility];  Service: General;  Laterality: N/A;  . TOOTH EXTRACTION    . TOTAL KNEE ARTHROPLASTY Left 05/15/2017   Procedure: LEFT TOTAL KNEE  ARTHROPLASTY;  Surgeon: Leandrew Koyanagi, MD;  Location: [facility];  Service: Orthopedics;  Laterality: Left;  . TOTAL KNEE ARTHROPLASTY Right 06/09/2018   Procedure: RIGHT TOTAL KNEE ARTHROPLASTY;  Surgeon: Leandrew Koyanagi, MD;  Location: [facility];  Service: Orthopedics;  Laterality: Right;  . TRANSTHORACIC ECHOCARDIOGRAM  2005   EF 60%   Social History   Occupational History  . 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 Smoking status: Never Smoker  . Smokeless tobacco: Never Used  . Tobacco comment: Never smoker; no plans to start  Substance and Sexual Activity  . Alcohol use: No  . Drug use: No  . Sexual activity: Not Currently

## 2018-09-15 ENCOUNTER — Telehealth (INDEPENDENT_AMBULATORY_CARE_PROVIDER_SITE_OTHER): Payer: Self-pay | Admitting: Orthopaedic Surgery

## 2018-09-15 MED ORDER — HYDROCODONE-ACETAMINOPHEN 5-325 MG PO TABS: 1.0000 | ORAL_TABLET | Freq: Four times a day (QID) | ORAL | 0 refills | Status: DC | PRN

## 2018-09-15 NOTE — Telephone Encounter (Signed)
Can pick up at front desk tomorrow waiting for signature from dr Erlinda Hong

## 2018-09-15 NOTE — Telephone Encounter (Signed)
[employer]co #[id number]

## 2018-09-15 NOTE — Telephone Encounter (Signed)
Patient called left voicemail message needing Rx refilled  (pain medicine). The number to contact patient is [phone]

## 2018-09-15 NOTE — Telephone Encounter (Signed)
Please advise 

## 2018-09-24 ENCOUNTER — Telehealth: Payer: Self-pay | Admitting: Family Medicine

## 2018-09-24 NOTE — Telephone Encounter (Signed)
Called patient to offer telephone appointment as alternative to in office visit tomorrow which was for diabetes follow up and a1c check. He has taken himself of metformin because he was not able to tolerate, is not interested in re-trying in the future. He has been on victoza 1.2mg  daily and tolerating well. His home sugars have been running in 102-150s, no lows.  Discussed postposing a1c check for 1 month pending COVID concerns. Patient agreeable. Patient will call back at that time.Discussed good hand hygiene and social distancing.

## 2018-09-25 ENCOUNTER — Ambulatory Visit: Payer: Medicare HMO | Admitting: Family Medicine

## 2018-10-09 ENCOUNTER — Other Ambulatory Visit: Payer: Self-pay | Admitting: Family Medicine

## 2018-11-05 ENCOUNTER — Other Ambulatory Visit: Payer: Self-pay | Admitting: Internal Medicine

## 2018-11-13 ENCOUNTER — Telehealth: Payer: Self-pay

## 2018-11-13 DIAGNOSIS — E1122 Type 2 diabetes mellitus with diabetic chronic kidney disease: Secondary | ICD-10-CM

## 2018-11-13 NOTE — Telephone Encounter (Signed)
Received fax from pharmacy requesting BD singe use swabs. These need to go to [facility]. Please advise.

## 2018-11-14 ENCOUNTER — Other Ambulatory Visit: Payer: Self-pay | Admitting: Family Medicine

## 2018-11-14 MED ORDER — BD SWAB SINGLE USE REGULAR PADS: 1.0000 | MEDICATED_PAD | 3 refills | Status: DC

## 2018-12-02 ENCOUNTER — Other Ambulatory Visit: Payer: Self-pay

## 2018-12-02 ENCOUNTER — Encounter: Payer: Self-pay | Admitting: Orthopaedic Surgery

## 2018-12-02 ENCOUNTER — Ambulatory Visit: Payer: Medicare HMO

## 2018-12-02 ENCOUNTER — Ambulatory Visit (INDEPENDENT_AMBULATORY_CARE_PROVIDER_SITE_OTHER): Payer: Medicare HMO | Admitting: Orthopaedic Surgery

## 2018-12-02 DIAGNOSIS — Z96651 Presence of right artificial knee joint: Secondary | ICD-10-CM | POA: Diagnosis not present

## 2018-12-02 DIAGNOSIS — Z96652 Presence of left artificial knee joint: Secondary | ICD-10-CM | POA: Diagnosis not present

## 2018-12-02 NOTE — Progress Notes (Signed)
Office Visit Note   Patient: Andrew [name]           Date of Birth: [birth year]           MRN: [id number] Visit Date: 12/02/2018              Requested by: Bufford Lope, DO [address] [place], [place] [address] PCP: Bufford Lope, DO   Assessment & Plan: Visit Diagnoses:  1. Status post right knee replacement   2. Status post total left knee replacement     Plan: Andrew [name] is doing very well 6 months status post right total knee replacement.  He is very happy overall.  We reinforced dental prophylaxis today.  Recheck in 6 months with 2 view x-rays of the right knee.  Follow-Up Instructions: Return in about 6 months (around 06/04/2019).   Orders:  Orders Placed This Encounter  Procedures  . XR KNEE 3 VIEW RIGHT   No orders of the defined types were placed in this encounter.     Procedures: No procedures performed   Clinical Data: No additional findings.   Subjective: Chief Complaint  Patient presents with  . Right Knee - Pain, Follow-up    Andrew [name] is 6 months status post right total knee replacement.  He is doing very well and happy overall.  He had an uneventful postoperative course.  He has resumed his normal activities at this point.  He does not take any pain medicines.   Review of Systems   Objective: Vital Signs: There were no vitals taken for this visit.  Physical Exam  Ortho Exam Right knee exam shows a fully healed surgical scar with excellent range of motion.  Collaterals are stable. Specialty Comments:  No specialty comments available.  Imaging: Xr Knee 3 View Right  Result Date: 12/02/2018   Stable right total knee replacement without complication.    PMFS History: Patient Active Problem List   Diagnosis Date Noted  . Primary osteoarthritis of right knee 05/21/2018  . Chronic left shoulder pain 09/20/2017  . 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 Chronic gout of multiple sites 05/02/2017  . Healthcare maintenance  04/04/2016  . Osteoarthritis of both knees 12/26/2015  . History of stroke 09/02/2014  . Hypothyroidism 05/25/2013  . Personal history of colonic adenomas 04/01/2013  . Hyperlipidemia associated with type 2 diabetes mellitus ([place]) 02/20/2007  . Controlled diabetes mellitus ([place]) 10/18/2006  . ERECTILE DYSFUNCTION 10/18/2006  . 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 Essential hypertension 10/18/2006  . CKD (chronic kidney disease) stage 3, GFR 30-59 ml/min ([place]) 09/05/2006   Past Medical History:  Diagnosis Date  . Arthritis   . CRD (chronic renal disease), stage II    Baseline Cr 1.2  . CVA (cerebral vascular accident) ([place])    x4 last one in 2007, R sided residual weakness  . HAMMER TOE 07/25/2010  . HLD (hyperlipidemia)   . HTN (hypertension)   . Hypothyroidism   . Leg swelling   . Lipoma of back 03/18/2011  . Personal history of colonic adenomas 04/01/2013  . Prostatitis    hx of x2  . PSEUDOFOLLICULITIS BARBAE 09/13/1694   Qualifier: Diagnosis of  By: Danise Mina  MD, Garlon Hatchet    . Sarcoidosis   . T2DM (type 2 diabetes mellitus) ([place])     Family History  Problem Relation Age of Onset  . Bone cancer Father   . Cancer Father 40  bone  . Uterine cancer Mother   . Cancer Mother   . Heart attack Brother   . Kidney disease Brother   . Diabetes Brother   . Arthritis Brother   . Heart disease Sister   . Congestive Heart Failure Sister   . Congestive Heart Failure Sister   . Congestive Heart Failure Sister   . Cancer Sister   . Diabetes Sister   . Arthritis Sister   . Breast cancer Sister   . Colon cancer Neg Hx   . Rectal cancer Neg Hx   . Stomach cancer Neg Hx   . Colon polyps Neg Hx   . Esophageal cancer Neg Hx     Past Surgical History:  Procedure Laterality Date  . COLONOSCOPY  03/24/2013  . COLONOSCOPY W/ POLYPECTOMY    . ERCP  2006   Normal  . ESOPHAGOGASTRODUODENOSCOPY    . EYE SURGERY Bilateral    cataracts  . [place]   x2 'inguinal  . LIPOMA EXCISION  06/22/2011   Procedure: EXCISION LIPOMA;  Surgeon: Rolm Bookbinder, MD;  Location: [place];  Service: General;  Laterality: N/A;  . TOOTH EXTRACTION    . TOTAL KNEE ARTHROPLASTY Left 05/15/2017   Procedure: LEFT TOTAL KNEE ARTHROPLASTY;  Surgeon: Leandrew Koyanagi, MD;  Location: [facility];  Service: Orthopedics;  Laterality: Left;  . TOTAL KNEE ARTHROPLASTY Right 06/09/2018   Procedure: RIGHT TOTAL KNEE ARTHROPLASTY;  Surgeon: Leandrew Koyanagi, MD;  Location: [facility] [facility];  Service: Orthopedics;  Laterality: Right;  . TRANSTHORACIC ECHOCARDIOGRAM  2005   EF 60%   Social History   Occupational History  . 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 Smoking status: Never Smoker  . Smokeless tobacco: Never Used  . Tobacco comment: Never smoker; no plans to start  Substance and Sexual Activity  . Alcohol use: No  . Drug use: No  . Sexual activity: Not Currently

## 2018-12-03 ENCOUNTER — Encounter: Payer: Self-pay | Admitting: Family Medicine

## 2018-12-03 ENCOUNTER — Other Ambulatory Visit: Payer: Self-pay

## 2018-12-03 ENCOUNTER — Ambulatory Visit (INDEPENDENT_AMBULATORY_CARE_PROVIDER_SITE_OTHER): Payer: Medicare HMO | Admitting: Family Medicine

## 2018-12-03 VITALS — BP 132/60 | HR 80 | Ht 73.0 in | Wt 205.5 lb

## 2018-12-03 DIAGNOSIS — I10 Essential (primary) hypertension: Secondary | ICD-10-CM | POA: Diagnosis not present

## 2018-12-03 DIAGNOSIS — E785 Hyperlipidemia, unspecified: Secondary | ICD-10-CM

## 2018-12-03 DIAGNOSIS — M1990 Unspecified osteoarthritis, unspecified site: Secondary | ICD-10-CM | POA: Insufficient documentation

## 2018-12-03 DIAGNOSIS — N183 Chronic kidney disease, stage 3 (moderate): Secondary | ICD-10-CM

## 2018-12-03 DIAGNOSIS — E1169 Type 2 diabetes mellitus with other specified complication: Secondary | ICD-10-CM | POA: Diagnosis not present

## 2018-12-03 DIAGNOSIS — E039 Hypothyroidism, unspecified: Secondary | ICD-10-CM | POA: Diagnosis not present

## 2018-12-03 DIAGNOSIS — E1122 Type 2 diabetes mellitus with diabetic chronic kidney disease: Secondary | ICD-10-CM | POA: Diagnosis not present

## 2018-12-03 LAB — POCT GLYCOSYLATED HEMOGLOBIN (HGB A1C): HbA1c, POC (controlled diabetic range): 6.4 % (ref 0.0–7.0)

## 2018-12-03 NOTE — Assessment & Plan Note (Signed)
Stable and controlled.  Continue Victoza 1.2 mg daily

## 2018-12-03 NOTE — Assessment & Plan Note (Signed)
Stable.  Continue lovastatin 40 mg daily.  Check lipid panel.

## 2018-12-03 NOTE — Assessment & Plan Note (Signed)
Patient is chronic left shoulder pain and low back pain.  He has had an x-ray showing degenerative changes of his L4-L5.  No red flag symptoms.  Given home exercises

## 2018-12-03 NOTE — Progress Notes (Signed)
    Subjective:  Andrew [name] is a 63 y.o. male who presents to the [facility] today for diabetes follow up  HPI:  Diabetes Patient is not on metformin due to intolerance.  He started back his Victoza 1.2 mg daily.  Compliant.  Tolerating well. He checks his blood sugars regularly at home.  He is glucometer with him today.  His blood sugars have been ranging from 87-134.  His average has been in the 110s to 120s.  He has no  symptomatic lows. He has no polyuria, polydipsia.  Hypertension. He has been on enalapril 20 mg daily, amlodipine 5 mg daily.  Compliant and tolerating well.  He is does not check his blood pressures at home. He has no lightheadedness, dizziness, chest pain, shortness of breath, lower extremity edema.  Arthritis He has been having some stiffness in his left shoulder and left lower back.  He has been trying to do some home exercises for his upper arm which seems to help.  Also of benefit has been to the area.  He has no bowel or bladder incontinence.  No paresthesias.  He does also have a recent knee replacement that he follows orthopedics yesterday and is doing well.  Hypothyroidism Doing well on levothyroxine.  No refills needed  Hyperlipidemia Doing well on lovastatin, no refills needed.  He is not fasting for blood work today.    ROS: Per HPI  Social Hx: He reports that he has never smoked. He has never used smokeless tobacco. He reports that he does not drink alcohol or use drugs.  CC, SH/smoking status, and VS noted  Objective:  Physical Exam: BP 132/60   Pulse 80   Ht 6\' 1"  (1.854 m)   Wt 205 lb 8 oz (93.2 kg)   SpO2 98%   BMI 27.11 kg/m   Gen: NAD, resting comfortably CV: RRR with no murmurs appreciated Pulm: NWOB, CTAB with no crackles, wheezes, or rhonchi GI: Normal bowel sounds present. Soft, Nontender, Nondistended. MSK: no edema, cyanosis, or clubbing noted Skin: warm, dry Neuro: grossly normal, moves all extremities Psych: Normal  affect and thought content  Results for orders placed or performed in visit on 12/03/18 (from the past 72 hour(s))  HgB A1c     Status: None   Collection Time: 12/03/18  9:56 AM  Result Value Ref Range   Hemoglobin A1C     HbA1c POC (<> result, manual entry)     HbA1c, POC (prediabetic range)     HbA1c, POC (controlled diabetic range) 6.4 0.0 - 7.0 %     Assessment/Plan:  Controlled diabetes mellitus ([place]) Stable and controlled.  Continue Victoza 1.2 mg daily  Essential hypertension Stable and controlled. Continue amlodipine 5 mg daily and enalapril 20 mg daily. Check BMP  Hyperlipidemia associated with type 2 diabetes mellitus Stable.  Continue lovastatin 40 mg daily.  Check lipid panel.  Hypothyroidism Stable.  Continue levothyroxine 75 mcg daily.  Check TSH  Arthritis Patient is chronic left shoulder pain and low back pain.  He has had an x-ray showing degenerative changes of his L4-L5.  No red flag symptoms.  Given home exercises    Orders Placed This Encounter  Procedures  . Basic Metabolic Panel  . TSH  . Lipid panel  . HgB A1c    Health Maintenance reviewed - patient asked to schedule diabetic eye exam.  Bufford Lope, DO PGY-3, [facility] 12/03/2018 10:05 AM

## 2018-12-03 NOTE — Patient Instructions (Addendum)
No changes to medications today.  We are checking some labs today. If results require attention, either myself or my nurse will get in touch with you. If everything is normal, you will get a letter in the mail or a message in My Chart. Please give [place] a call if you do not hear from [place] after 2 weeks.    Shoulder Exercises Ask your health care provider which exercises are safe for you. Do exercises exactly as told by your health care provider and adjust them as directed. It is normal to feel mild stretching, pulling, tightness, or discomfort as you do these exercises, but you should stop right away if you feel sudden pain or your pain gets worse.Do not begin these exercises until told by your health care provider. Range of Motion Exercises        These exercises warm up your muscles and joints and improve the movement and flexibility of your shoulder. These exercises also help to relieve pain, numbness, and tingling. These exercises involve stretching your injured shoulder directly. Exercise A: Pendulum 1. Stand near a wall or a surface that you can hold onto for balance. 2. Bend at the waist and let your left / right arm hang straight down. Use your other arm to support you. Keep your back straight and do not lock your knees. 3. Relax your left / right arm and shoulder muscles, and move your hips and your trunk so your left / right arm swings freely. Your arm should swing because of the motion of your body, not because you are using your arm or shoulder muscles. 4. Keep moving your body so your arm swings in the following directions, as told by your health care provider: ? Side to side. ? Forward and backward. ? In clockwise and counterclockwise circles. 5. Continue each motion for __________ seconds, or for as long as told by your health care provider. 6. Slowly return to the starting position. Repeat __________ times. Complete this exercise __________ times a day. Exercise B:Flexion,  Standing 1. Stand and hold a broomstick, a cane, or a similar object. Place your hands a little more than shoulder-width apart on the object. Your left / right hand should be palm-up, and your other hand should be palm-down. 2. Keep your elbow straight and keep your shoulder muscles relaxed. Push the stick down with your healthy arm to raise your left / right arm in front of your body, and then over your head until you feel a stretch in your shoulder. ? Avoid shrugging your shoulder while you raise your arm. Keep your shoulder blade tucked down toward the middle of your back. 3. Hold for __________ seconds. 4. Slowly return to the starting position. Repeat __________ times. Complete this exercise __________ times a day. Exercise C: Abduction, Standing 1. Stand and hold a broomstick, a cane, or a similar object. Place your hands a little more than shoulder-width apart on the object. Your left / right hand should be palm-up, and your other hand should be palm-down. 2. While keeping your elbow straight and your shoulder muscles relaxed, push the stick across your body toward your left / right side. Raise your left / right arm to the side of your body and then over your head until you feel a stretch in your shoulder. ? Do not raise your arm above shoulder height, unless your health care provider tells you to do that. ? Avoid shrugging your shoulder while you raise your arm. Keep your shoulder blade  tucked down toward the middle of your back. 3. Hold for __________ seconds. 4. Slowly return to the starting position. Repeat __________ times. Complete this exercise __________ times a day. Exercise D:Internal Rotation 1. Place your left / right hand behind your back, palm-up. 2. Use your other hand to dangle an exercise band, a towel, or a similar object over your shoulder. Grasp the band with your left / right hand so you are holding onto both ends. 3. Gently pull up on the band until you feel a stretch  in the front of your left / right shoulder. ? Avoid shrugging your shoulder while you raise your arm. Keep your shoulder blade tucked down toward the middle of your back. 4. Hold for __________ seconds. 5. Release the stretch by letting go of the band and lowering your hands. Repeat __________ times. Complete this exercise __________ times a day. Stretching Exercises  These exercises warm up your muscles and joints and improve the movement and flexibility of your shoulder. These exercises also help to relieve pain, numbness, and tingling. These exercises are done using your healthy shoulder to help stretch the muscles of your injured shoulder. Exercise E: Warehouse manager (External Rotation and Abduction) 1. Stand in a doorway with one of your feet slightly in front of the other. This is called a staggered stance. If you cannot reach your forearms to the door frame, stand facing a corner of a room. 2. Choose one of the following positions as told by your health care provider: ? Place your hands and forearms on the door frame above your head. ? Place your hands and forearms on the door frame at the height of your head. ? Place your hands on the door frame at the height of your elbows. 3. Slowly move your weight onto your front foot until you feel a stretch across your chest and in the front of your shoulders. Keep your head and chest upright and keep your abdominal muscles tight. 4. Hold for __________ seconds. 5. To release the stretch, shift your weight to your back foot. Repeat __________ times. Complete this stretch __________ times a day. Exercise F:Extension, Standing 1. Stand and hold a broomstick, a cane, or a similar object behind your back. ? Your hands should be a little wider than shoulder-width apart. ? Your palms should face away from your back. 2. Keeping your elbows straight and keeping your shoulder muscles relaxed, move the stick away from your body until you feel a stretch in  your shoulder. ? Avoid shrugging your shoulders while you move the stick. Keep your shoulder blade tucked down toward the middle of your back. 3. Hold for __________ seconds. 4. Slowly return to the starting position. Repeat __________ times. Complete this exercise __________ times a day. Strengthening Exercises           These exercises build strength and endurance in your shoulder. Endurance is the ability to use your muscles for a long time, even after they get tired. Exercise G:External Rotation 1. Sit in a stable chair without armrests. 2. Secure an exercise band at elbow height on your left / right side. 3. Place a soft object, such as a folded towel or a small pillow, between your left / right upper arm and your body to move your elbow a few inches away (about 10 cm) from your side. 4. Hold the end of the band so it is tight and there is no slack. 5. Keeping your elbow pressed against the soft  object, move your left / right forearm out, away from your abdomen. Keep your body steady so only your forearm moves. 6. Hold for __________ seconds. 7. Slowly return to the starting position. Repeat __________ times. Complete this exercise __________ times a day. Exercise H:Shoulder Abduction 1. Sit in a stable chair without armrests, or stand. 2. Hold a __________ weight in your left / right hand, or hold an exercise band with both hands. 3. Start with your arms straight down and your left / right palm facing in, toward your body. 4. Slowly lift your left / right hand out to your side. Do not lift your hand above shoulder height unless your health care provider tells you that this is safe. ? Keep your arms straight. ? Avoid shrugging your shoulder while you do this movement. Keep your shoulder blade tucked down toward the middle of your back. 5. Hold for __________ seconds. 6. Slowly lower your arm, and return to the starting position. Repeat __________ times. Complete this exercise  __________ times a day. Exercise I:Shoulder Extension 1. Sit in a stable chair without armrests, or stand. 2. Secure an exercise band to a stable object in front of you where it is at shoulder height. 3. Hold one end of the exercise band in each hand. Your palms should face each other. 4. Straighten your elbows and lift your hands up to shoulder height. 5. Step back, away from the secured end of the exercise band, until the band is tight and there is no slack. 6. Squeeze your shoulder blades together as you pull your hands down to the sides of your thighs. Stop when your hands are straight down by your sides. Do not let your hands go behind your body. 7. Hold for __________ seconds. 8. Slowly return to the starting position. Repeat __________ times. Complete this exercise __________ times a day. Exercise J:Standing Shoulder Row 1. Sit in a stable chair without armrests, or stand. 2. Secure an exercise band to a stable object in front of you so it is at waist height. 3. Hold one end of the exercise band in each hand. Your palms should be in a thumbs-up position. 4. Bend each of your elbows to an "L" shape (about 90 degrees) and keep your upper arms at your sides. 5. Step back until the band is tight and there is no slack. 6. Slowly pull your elbows back behind you. 7. Hold for __________ seconds. 8. Slowly return to the starting position. Repeat __________ times. Complete this exercise __________ times a day. Exercise K:Shoulder Press-Ups 1. Sit in a stable chair that has armrests. Sit upright, with your feet flat on the floor. 2. Put your hands on the armrests so your elbows are bent and your fingers are pointing forward. Your hands should be about even with the sides of your body. 3. Push down on the armrests and use your arms to lift yourself off of the chair. Straighten your elbows and lift yourself up as much as you comfortably can. ? Move your shoulder blades down, and avoid letting  your shoulders move up toward your ears. ? Keep your feet on the ground. As you get stronger, your feet should support less of your body weight as you lift yourself up. 4. Hold for __________ seconds. 5. Slowly lower yourself back into the chair. Repeat __________ times. Complete this exercise __________ times a day. Exercise L: Wall Push-Ups 1. Stand so you are facing a stable wall. Your feet should be about  one arm-length away from the wall. 2. Lean forward and place your palms on the wall at shoulder height. 3. Keep your feet flat on the floor as you bend your elbows and lean forward toward the wall. 4. Hold for __________ seconds. 5. Straighten your elbows to push yourself back to the starting position. Repeat __________ times. Complete this exercise __________ times a day. This information is not intended to replace advice given to you by your health care provider. Make sure you discuss any questions you have with your health care provider. Document Released: 05/09/2005 Document Revised: 10/29/2017 Document Reviewed: 03/06/2015 Elsevier Interactive Patient Education  2019 [employer].

## 2018-12-03 NOTE — Assessment & Plan Note (Signed)
Stable.  Continue levothyroxine 75 mcg daily.  Check TSH

## 2018-12-03 NOTE — Assessment & Plan Note (Addendum)
Stable and controlled. Continue amlodipine 5 mg daily and enalapril 20 mg daily. Check BMP

## 2018-12-04 ENCOUNTER — Encounter: Payer: Self-pay | Admitting: Family Medicine

## 2018-12-04 LAB — BASIC METABOLIC PANEL
BUN/Creatinine Ratio: 12 (ref 10–24)
BUN: 15 mg/dL (ref 8–27)
CO2: 21 mmol/L (ref 20–29)
Calcium: 9.4 mg/dL (ref 8.6–10.2)
Chloride: 102 mmol/L (ref 96–106)
Creatinine, Ser: 1.25 mg/dL (ref 0.76–1.27)
GFR calc Af Amer: 71 mL/min/{1.73_m2} (ref >59)
GFR calc non Af Amer: 61 mL/min/{1.73_m2} (ref >59)
Glucose: 119 mg/dL — ABNORMAL HIGH (ref 65–99)
Potassium: 4.3 mmol/L (ref 3.5–5.2)
Sodium: 140 mmol/L (ref 134–144)

## 2018-12-04 LAB — TSH: TSH: 1.4 u[IU]/mL (ref 0.450–4.500)

## 2018-12-04 LAB — LIPID PANEL
Chol/HDL Ratio: 2.3 ratio (ref 0.0–5.0)
Cholesterol, Total: 113 mg/dL (ref 100–199)
HDL: 49 mg/dL (ref >39)
LDL Calculated: 47 mg/dL (ref 0–99)
Triglycerides: 86 mg/dL (ref 0–149)
VLDL Cholesterol Cal: 17 mg/dL (ref 5–40)

## 2018-12-05 ENCOUNTER — Other Ambulatory Visit: Payer: Self-pay | Admitting: Family Medicine

## 2018-12-06 ENCOUNTER — Telehealth: Payer: Self-pay | Admitting: Cardiology

## 2018-12-06 NOTE — Telephone Encounter (Signed)
Left voice mail for patient to call [phone].  The call is in reference for recent office visit 5/26 and possible exposure to Covid and offer testing

## 2018-12-09 ENCOUNTER — Telehealth: Payer: Self-pay | Admitting: *Deleted

## 2018-12-09 DIAGNOSIS — M1990 Unspecified osteoarthritis, unspecified site: Secondary | ICD-10-CM

## 2018-12-09 NOTE — Telephone Encounter (Signed)
Pt calls because he has been doing the home exercises once a day, but does not feel much relief.  States that the pain is worse when he awakes in the am.  Says that bengay helps " a little".  He would like to know what the next steps are.  To MD to advise. Christen Bame, CMA

## 2018-12-09 NOTE — Telephone Encounter (Signed)
I recommend formal PT. Order placed. Please inform patient and see if he is agreeable.

## 2018-12-09 NOTE — Telephone Encounter (Signed)
Spoke with patient and he is agreeable to physical therapy.  Will let provider know so referral can be placed.  Kerrigan Gombos,CMA

## 2018-12-10 NOTE — Telephone Encounter (Signed)
Order was already placed. Thanks

## 2018-12-12 ENCOUNTER — Other Ambulatory Visit: Payer: Medicare HMO

## 2018-12-12 ENCOUNTER — Telehealth: Payer: Self-pay | Admitting: *Deleted

## 2018-12-12 DIAGNOSIS — Z20822 Contact with and (suspected) exposure to covid-19: Secondary | ICD-10-CM

## 2018-12-12 DIAGNOSIS — R6889 Other general symptoms and signs: Secondary | ICD-10-CM | POA: Diagnosis not present

## 2018-12-12 NOTE — Telephone Encounter (Signed)
Pt called regarding his recent visit to [facility] 12/02/2018 and his potentially being exposed to an employee that later tested positive for the COVID-19.  We are offering free testing.  I scheduled him for 12/12/2018 at 10:00 at the [facility] in [place].   I let him know to stay in the car and wear a mask.   There is no charge to him.

## 2018-12-15 LAB — NOVEL CORONAVIRUS, NAA: SARS-CoV-2, NAA: NOT DETECTED

## 2018-12-22 ENCOUNTER — Ambulatory Visit: Payer: Medicare HMO | Admitting: Physical Therapy

## 2019-02-24 ENCOUNTER — Other Ambulatory Visit: Payer: Self-pay | Admitting: *Deleted

## 2019-02-24 ENCOUNTER — Telehealth: Payer: Self-pay | Admitting: *Deleted

## 2019-02-24 DIAGNOSIS — E1122 Type 2 diabetes mellitus with diabetic chronic kidney disease: Secondary | ICD-10-CM

## 2019-02-24 MED ORDER — TRUE METRIX BLOOD GLUCOSE TEST VI STRP: ORAL_STRIP | 12 refills | Status: DC

## 2019-02-24 MED ORDER — BD SWAB SINGLE USE REGULAR PADS: 1.0000 | MEDICATED_PAD | 12 refills | Status: DC

## 2019-02-24 MED ORDER — TRUE METRIX AIR GLUCOSE METER W/DEVICE KIT: 1.0000 | PACK | Freq: Two times a day (BID) | 0 refills | Status: DC

## 2019-02-24 NOTE — Telephone Encounter (Signed)
Opened in error.  Andrew [name], CMA

## 2019-03-05 ENCOUNTER — Other Ambulatory Visit: Payer: Self-pay

## 2019-03-05 ENCOUNTER — Ambulatory Visit (INDEPENDENT_AMBULATORY_CARE_PROVIDER_SITE_OTHER): Payer: Medicare HMO | Admitting: *Deleted

## 2019-03-05 DIAGNOSIS — H401131 Primary open-angle glaucoma, bilateral, mild stage: Secondary | ICD-10-CM | POA: Diagnosis not present

## 2019-03-05 DIAGNOSIS — Z23 Encounter for immunization: Secondary | ICD-10-CM | POA: Diagnosis not present

## 2019-03-05 DIAGNOSIS — H524 Presbyopia: Secondary | ICD-10-CM | POA: Diagnosis not present

## 2019-03-05 DIAGNOSIS — E119 Type 2 diabetes mellitus without complications: Secondary | ICD-10-CM | POA: Diagnosis not present

## 2019-03-05 DIAGNOSIS — Z961 Presence of intraocular lens: Secondary | ICD-10-CM | POA: Diagnosis not present

## 2019-03-05 DIAGNOSIS — Z794 Long term (current) use of insulin: Secondary | ICD-10-CM | POA: Diagnosis not present

## 2019-03-05 DIAGNOSIS — H52202 Unspecified astigmatism, left eye: Secondary | ICD-10-CM | POA: Diagnosis not present

## 2019-03-05 NOTE — Progress Notes (Signed)
Pt tolerated flu shot well. Andrew [name] Kennon Holter, CMA

## 2019-03-12 DIAGNOSIS — Z0100 Encounter for examination of eyes and vision without abnormal findings: Secondary | ICD-10-CM | POA: Diagnosis not present

## 2019-03-12 DIAGNOSIS — H524 Presbyopia: Secondary | ICD-10-CM | POA: Diagnosis not present

## 2019-03-31 ENCOUNTER — Ambulatory Visit (INDEPENDENT_AMBULATORY_CARE_PROVIDER_SITE_OTHER): Payer: Medicare HMO | Admitting: Family Medicine

## 2019-03-31 ENCOUNTER — Other Ambulatory Visit: Payer: Self-pay

## 2019-03-31 ENCOUNTER — Encounter: Payer: Self-pay | Admitting: Family Medicine

## 2019-03-31 VITALS — BP 132/78 | HR 68 | Wt 206.4 lb

## 2019-03-31 DIAGNOSIS — M62830 Muscle spasm of back: Secondary | ICD-10-CM

## 2019-03-31 DIAGNOSIS — E1169 Type 2 diabetes mellitus with other specified complication: Secondary | ICD-10-CM

## 2019-03-31 DIAGNOSIS — B070 Plantar wart: Secondary | ICD-10-CM | POA: Diagnosis not present

## 2019-03-31 DIAGNOSIS — N183 Chronic kidney disease, stage 3 unspecified: Secondary | ICD-10-CM

## 2019-03-31 DIAGNOSIS — M79672 Pain in left foot: Secondary | ICD-10-CM

## 2019-03-31 DIAGNOSIS — E039 Hypothyroidism, unspecified: Secondary | ICD-10-CM

## 2019-03-31 DIAGNOSIS — S92322D Displaced fracture of second metatarsal bone, left foot, subsequent encounter for fracture with routine healing: Secondary | ICD-10-CM

## 2019-03-31 DIAGNOSIS — E1122 Type 2 diabetes mellitus with diabetic chronic kidney disease: Secondary | ICD-10-CM

## 2019-03-31 DIAGNOSIS — E7800 Pure hypercholesterolemia, unspecified: Secondary | ICD-10-CM | POA: Diagnosis not present

## 2019-03-31 DIAGNOSIS — R3914 Feeling of incomplete bladder emptying: Secondary | ICD-10-CM

## 2019-03-31 DIAGNOSIS — M62838 Other muscle spasm: Secondary | ICD-10-CM

## 2019-03-31 DIAGNOSIS — E785 Hyperlipidemia, unspecified: Secondary | ICD-10-CM

## 2019-03-31 DIAGNOSIS — N401 Enlarged prostate with lower urinary tract symptoms: Secondary | ICD-10-CM

## 2019-03-31 DIAGNOSIS — H409 Unspecified glaucoma: Secondary | ICD-10-CM

## 2019-03-31 LAB — POCT GLYCOSYLATED HEMOGLOBIN (HGB A1C): HbA1c, POC (controlled diabetic range): 6.1 % (ref 0.0–7.0)

## 2019-03-31 MED ORDER — TRAMADOL HCL 50 MG PO TABS: 50.0000 mg | ORAL_TABLET | Freq: Three times a day (TID) | ORAL | 0 refills | Status: DC | PRN

## 2019-03-31 MED ORDER — LIRAGLUTIDE 18 MG/3ML ~~LOC~~ SOPN: 0.6000 mg | PEN_INJECTOR | Freq: Every day | SUBCUTANEOUS | 11 refills | Status: DC

## 2019-03-31 MED ORDER — AMLODIPINE BESYLATE 5 MG PO TABS: 5.0000 mg | ORAL_TABLET | Freq: Every day | ORAL | 3 refills | Status: DC

## 2019-03-31 MED ORDER — TAMSULOSIN HCL 0.4 MG PO CAPS: 0.4000 mg | ORAL_CAPSULE | Freq: Every day | ORAL | 3 refills | Status: DC

## 2019-03-31 MED ORDER — VITAMIN D3 25 MCG (1000 UNIT) PO TABS: 1000.0000 [IU] | ORAL_TABLET | Freq: Every day | ORAL | 3 refills | Status: DC

## 2019-03-31 MED ORDER — ASPIRIN EC 81 MG PO TBEC: 81.0000 mg | DELAYED_RELEASE_TABLET | Freq: Once | ORAL | 0 refills | Status: AC

## 2019-03-31 MED ORDER — LEVOTHYROXINE SODIUM 75 MCG PO TABS: 75.0000 ug | ORAL_TABLET | Freq: Every day | ORAL | 3 refills | Status: DC

## 2019-03-31 MED ORDER — ROSUVASTATIN CALCIUM 20 MG PO TABS: 20.0000 mg | ORAL_TABLET | Freq: Every day | ORAL | 3 refills | Status: DC

## 2019-03-31 MED ORDER — BACLOFEN 5 MG HALF TABLET: 10.0000 mg | ORAL_TABLET | Freq: Three times a day (TID) | ORAL | Status: DC

## 2019-03-31 MED ORDER — ALLOPURINOL 100 MG PO TABS: 200.0000 mg | ORAL_TABLET | Freq: Every day | ORAL | 3 refills | Status: DC

## 2019-03-31 MED ORDER — ENALAPRIL MALEATE 20 MG PO TABS: 40.0000 mg | ORAL_TABLET | Freq: Every day | ORAL | 3 refills | Status: DC

## 2019-03-31 NOTE — Assessment & Plan Note (Signed)
Diabetes well controlled. Liraglutide reduced to 0.6mg  daily. Counseled pt to increase carbohydrate content, ie sweet potato and brown rice/wheat to reduce risk of hypoglycemic events.
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
## 2019-03-31 NOTE — Patient Instructions (Signed)
Andrew [name],  It was a pleasure seeing you today! I am happy to be your new PCP.  Today we discussed a few things.  Your HbA1c is 6.1 which is an improvement from previously in May where it was 6.4.  You are doing an excellent job managing your diabetes. We are reducing the dose of Victoza to 0.6 mg as you are having low blood sugars because of your increased exercise daily.  Please increase your daily carbohydrate intake, we discussed eating sweet potato and brown rice which are good foods for diabetics.  We are starting a new cholesterol medication called rosuvastatin stop in your old statin.  We also checking your thyroid and cholesterol levels today with a blood test.  We will be in touch with you if they are abnormal.  I have referred you to a podiatrist for the plantar wart and foot pain you are having.  Please take the tramadol as you needed for the foot pain.  I have stopped the methocarbamol and started you on baclofen for muscle spasm.  Please contact your ophthalmologist so that we have access to the information for your glaucoma.  Please follow up in 3 months for diabetic check or sooner if symptoms persist or worsen. Please call the clinic immediately if you have any concerns.   Our clinic's number is [phone]. Please call with questions or concerns.   Thank you and best wishes,   Lattie Haw, MD

## 2019-04-01 DIAGNOSIS — H409 Unspecified glaucoma: Secondary | ICD-10-CM | POA: Insufficient documentation

## 2019-04-01 DIAGNOSIS — B070 Plantar wart: Secondary | ICD-10-CM | POA: Insufficient documentation

## 2019-04-01 DIAGNOSIS — M62830 Muscle spasm of back: Secondary | ICD-10-CM | POA: Insufficient documentation

## 2019-04-01 DIAGNOSIS — N400 Enlarged prostate without lower urinary tract symptoms: Secondary | ICD-10-CM | POA: Insufficient documentation

## 2019-04-01 DIAGNOSIS — S92302A Fracture of unspecified metatarsal bone(s), left foot, initial encounter for closed fracture: Secondary | ICD-10-CM | POA: Insufficient documentation

## 2019-04-01 DIAGNOSIS — S99199A Other physeal fracture of unspecified metatarsal, initial encounter for closed fracture: Secondary | ICD-10-CM | POA: Insufficient documentation

## 2019-04-01 LAB — LIPID PANEL
Chol/HDL Ratio: 2.6 ratio (ref 0.0–5.0)
Cholesterol, Total: 108 mg/dL (ref 100–199)
HDL: 42 mg/dL (ref >39)
LDL Chol Calc (NIH): 47 mg/dL (ref 0–99)
Triglycerides: 99 mg/dL (ref 0–149)
VLDL Cholesterol Cal: 19 mg/dL (ref 5–40)

## 2019-04-01 LAB — TSH: TSH: 1.98 u[IU]/mL (ref 0.450–4.500)

## 2019-04-01 NOTE — Assessment & Plan Note (Signed)
Likely 1-31mm plantar wart on medial, plantar aspect of left foot. Referred to podiatry for further assessment.

## 2019-04-01 NOTE — Assessment & Plan Note (Addendum)
Stopped pts Levastatin and started Rosuvastatin due to hx of stroke, pt should be on high potency statin. Lipid panel taken today.
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
## 2019-04-01 NOTE — Assessment & Plan Note (Addendum)
Urinary sx likely secondary to BPH. Started on Tamsulosin 0.4mg 

## 2019-04-01 NOTE — Assessment & Plan Note (Addendum)
Stopped Methocarbamol stopped and started 10mg  baclofen TID as more favorable medication in this patient's age group.

## 2019-04-01 NOTE — Assessment & Plan Note (Signed)
Likely secondary to non healing metatarsal fracture and plantar wart. Refilled 30 tablets of Tramadol for patient.

## 2019-04-01 NOTE — Assessment & Plan Note (Signed)
Levothyroxine refilled today and TSH level checked.

## 2019-04-02 ENCOUNTER — Telehealth: Payer: Self-pay | Admitting: *Deleted

## 2019-04-02 ENCOUNTER — Telehealth: Payer: Self-pay

## 2019-04-02 NOTE — Telephone Encounter (Signed)
Pharmacy needs clarification on rx for aspirin. Directions say "take one tablet by mouth once for one dose". Did you mean to put daily instead of dose? Please advise. Deseree Kennon Holter, CMA

## 2019-04-02 NOTE — Telephone Encounter (Signed)
Received fax from pharmacy trying to verify directions of use for Victoza. The script as of now is written with directions of to inject 0.6mg  daily for 365 days. However, 0.6mg  is a starter dose for 1 week and titrate up to either 1.2mg  or 1.8mg  daily. Please advise.

## 2019-04-04 NOTE — Telephone Encounter (Signed)
I meant 81mg  once daily of Aspirin sorry

## 2019-04-06 ENCOUNTER — Telehealth: Payer: Self-pay | Admitting: *Deleted

## 2019-04-06 NOTE — Telephone Encounter (Signed)
Pt calls for the following:  1. He thinks that he needs to be taken off the victoza.  He feels that it "builds up in me and makes my sugar drop to low".  States that his sugars this weekend ran between 90 and 170-ish.  States when his sugar gets below 112 he gets lightheaded.  Appt made with Dr. Valentina Lucks as Dr. Posey Pronto does not have any openings.  2. Wants to know if Dr. Posey Pronto received paperwork from his ophthalmologist   Christen Bame, [place]

## 2019-04-06 NOTE — Telephone Encounter (Signed)
Hi Andrew [name] thank you for making the appt with Dr Valentina Lucks, appreciate it. I think that is appropriate! Hopefully he has been eating more carbohydrates, as last week he was exercising frequently and not eating carbohydrates which explains his hypo symptoms. Where do I look to find his ophthalmology notes? Thank you

## 2019-04-06 NOTE — Telephone Encounter (Signed)
Hi I would like to keep him on the 0.6mg  as his diabetes has been well controlled and he has been getting hypos with his previous 1.2mg  dose. Thank you.

## 2019-04-07 ENCOUNTER — Encounter: Payer: Self-pay | Admitting: Pharmacist

## 2019-04-07 ENCOUNTER — Ambulatory Visit (INDEPENDENT_AMBULATORY_CARE_PROVIDER_SITE_OTHER): Payer: Medicare HMO | Admitting: Pharmacist

## 2019-04-07 ENCOUNTER — Other Ambulatory Visit: Payer: Self-pay

## 2019-04-07 VITALS — BP 132/72 | HR 68 | Ht 73.0 in | Wt 205.2 lb

## 2019-04-07 DIAGNOSIS — R3914 Feeling of incomplete bladder emptying: Secondary | ICD-10-CM

## 2019-04-07 DIAGNOSIS — H401131 Primary open-angle glaucoma, bilateral, mild stage: Secondary | ICD-10-CM | POA: Diagnosis not present

## 2019-04-07 DIAGNOSIS — E1122 Type 2 diabetes mellitus with diabetic chronic kidney disease: Secondary | ICD-10-CM | POA: Diagnosis not present

## 2019-04-07 DIAGNOSIS — N183 Chronic kidney disease, stage 3 (moderate): Secondary | ICD-10-CM | POA: Diagnosis not present

## 2019-04-07 DIAGNOSIS — N401 Enlarged prostate with lower urinary tract symptoms: Secondary | ICD-10-CM | POA: Diagnosis not present

## 2019-04-07 MED ORDER — TRUE METRIX BLOOD GLUCOSE TEST VI STRP: ORAL_STRIP | 12 refills | Status: DC

## 2019-04-07 NOTE — Telephone Encounter (Signed)
Great - thanks

## 2019-04-07 NOTE — Assessment & Plan Note (Signed)
Diabetes longstanding currently controlled. Patient is able to verbalize appropriate hypoglycemia management plan. Patient is adherent with medication.  -Discontinued GLP-1 Victoza (generic name liraglutide) because patient has been complaining of hypoglycemia and does not like injections. He stopped this medication himself even after trying to reduce the dose to 0.6mg  daily per Dr. Serita Grit recommendation.  -Will give him a trial without diabetes meds since his diabetes is well controlled and he is active and adheres to a strict diet. He will continue to check his BG 2x/day and we will assess his BG control at next visit. -He is a great candidate for a SGLT2i. Spoke with him about this today and may start it at next visit for cardiovascular and renal benefit. He verbalized understanding and willingness to start this regimen after a period of being off medications. Told him to call us if he gets and BG readings >200 and we would start it right away.  -Extensively discussed pathophysiology of DM, recommended lifestyle interventions, dietary effects on glycemic control -Counseled on s/sx of and management of hypoglycemia using the rule of 15 -Next A1C anticipated December

## 2019-04-07 NOTE — Progress Notes (Signed)
Reviewed: Agree with Dr. Koval's documentation and management. 

## 2019-04-07 NOTE — Progress Notes (Signed)
Thank you for seeing Andrew [name], Dr Valentina Lucks and for helping manage his diabetes and BPH. Appreciate it!

## 2019-04-07 NOTE — Telephone Encounter (Signed)
He said that they were supposed to be faxing to you so it should be in your box. Christen Bame, CMA

## 2019-04-07 NOTE — Patient Instructions (Addendum)
It was a pleasure meeting with you today.  Today we increased your Flomax (tamsulosin) to 0.8mg  daily. Start taking 2 capsules by mouth daily to help reduce how much you have to keep going to the bathroom.   Stop taking your Victoza (liraglutide). We will see how you do without any diabetes medications for a few weeks. Continue your diet and exercise regimen. We will see you back in 4-8 weeks and see how your blood sugar is doing and we may start a medication such as Jardiance, Invokana, or [place].

## 2019-04-07 NOTE — Progress Notes (Signed)
S:     Chief Complaint  Patient presents with  . Medication Management    Diabetes    Patient arrives in good spirits and is ambulating well.  Presents for diabetes evaluation, education, and management Patient was referred and last seen by Primary Care Provider on 9/22.  Patient reports Diabetes was diagnosed ~2010.   Family/Social History: mother, sister, brother  Insurance coverage/medication affordability: [employer]   Patient reports adherence with medications.  Current diabetes medications include: Victoza 0.6mg  (has not used in 2 days d/t feeling hypoglycemia) Current hypertension medications include: amlodipine 5mg , enalapril 40mg  daily Current hyperlipidemia medications include: Crestor 20mg  daily  Patient reports hypoglycemic events. CBGs reveals lowest BG value=90  Patient-reported exercise habits: Patient walks 1 mile for 6 days a week. Also lifts weights and does pushups every other day.   Patient reports nocturia.   O:  Physical Exam Constitutional:      Appearance: Normal appearance.  Neurological:     Mental Status: He is alert.  Psychiatric:        Mood and Affect: Mood normal.        Behavior: Behavior normal.        Thought Content: Thought content normal.        Judgment: Judgment normal.     Review of Systems  All other systems reviewed and are negative.    Lab Results  Component Value Date   HGBA1C 6.1 03/31/2019   Vitals:   04/07/19 1046  BP: 132/72  Pulse: 68  SpO2: 98%    Lipid Panel     Component Value Date/Time   CHOL 108 03/31/2019 1140   TRIG 99 03/31/2019 1140   HDL 42 03/31/2019 1140   CHOLHDL 2.6 03/31/2019 1140   CHOLHDL 2.5 10/26/2014 1118   VLDL 20 10/26/2014 1118   LDLCALC 47 03/31/2019 1140   LDLDIRECT 72 06/04/2012 0948   CBG averages: 7 day: 130 14 day: 131 30 day: 129 60 day: 128 90 day: 130   Clinical ASCVD: Yes  The ASCVD Risk score Mikey Bussing DC Jr., et al., 2013) failed to calculate for the  following reasons:   The valid total cholesterol range is 130 to 320 mg/dL   A/P: Diabetes longstanding currently controlled. Patient is able to verbalize appropriate hypoglycemia management plan. Patient is adherent with medication.  -Discontinued GLP-1 Victoza (generic name liraglutide) because patient has been complaining of hypoglycemia and does not like injections. He stopped this medication himself even after trying to reduce the dose to 0.6mg  daily per Dr. Serita Grit recommendation.  -Will give him a trial without diabetes meds since his diabetes is well controlled and he is active and adheres to a strict diet. He will continue to check his BG 2x/day and we will assess his BG control at next visit. -He is a great candidate for a SGLT2i. Spoke with him about this today and may start it at next visit for cardiovascular and renal benefit. He verbalized understanding and willingness to start this regimen after a period of being off medications. Told him to call us if he gets and BG readings >200 and we would start it right away.  -Extensively discussed pathophysiology of DM, recommended lifestyle interventions, dietary effects on glycemic control -Counseled on s/sx of and management of hypoglycemia using the rule of 15 -Next A1C anticipated December  Patient still complains of frequent urination during the day and reports going about 4 times per night before starting tamsulosin. Over the last week since  starting tamsulosin, nocturia has reduced to 2x/night but he would like it to be 0-1x/night. Today we increased his tamsulosin to 0.8mg  daily and counseled him on the possibility of orthostatic hypotension and for him to let us know if this becomes problematic.   ASCVD risk - secondary prevention in patient with DM. Last LDL is controlled. Previous hx of strokes --> high intensity statin indicated. Aspirin is indicated.  -Continued aspirin 81 mg daily -Continued rosuvastatin 20 mg   Hypertension  longstanding currently controlled. BP goal = 140/90 mmHg. Patient is adherent with medication.  Written patient instructions provided.  Total time in face to face counseling 45 minutes.   Follow up Pharmacist/PCP in 4-8 weeks. Patient seen with Trevor Mace PGY1 Pharmacy resident.

## 2019-04-07 NOTE — Assessment & Plan Note (Signed)
Patient still complains of frequent urination during the day and reports going about 4 times per night before starting tamsulosin. Over the last week since starting tamsulosin, nocturia has reduced to 2x/night but he would like it to be 0-1x/night. Today we increased his tamsulosin to 0.8mg  daily and counseled him on the possibility of orthostatic hypotension and for him to let us know if this becomes problematic.

## 2019-04-08 ENCOUNTER — Other Ambulatory Visit: Payer: Self-pay | Admitting: Family Medicine

## 2019-04-08 MED ORDER — ASPIRIN EC 81 MG PO TBEC: 81.0000 mg | DELAYED_RELEASE_TABLET | Freq: Every day | ORAL | 2 refills | Status: DC

## 2019-04-08 NOTE — Telephone Encounter (Signed)
Did you resend the rx with correct directions? Deseree Kennon Holter, CMA

## 2019-04-08 NOTE — Telephone Encounter (Signed)
Yes I just refilled it for him at the correct dose of 81mg  once daily. Are you able to inform the pt? Thanks Deseree

## 2019-04-09 ENCOUNTER — Other Ambulatory Visit: Payer: Self-pay | Admitting: Pharmacist

## 2019-04-09 ENCOUNTER — Telehealth: Payer: Self-pay | Admitting: *Deleted

## 2019-04-09 MED ORDER — EMPAGLIFLOZIN 10 MG PO TABS: 10.0000 mg | ORAL_TABLET | Freq: Every day | ORAL | 1 refills | Status: DC

## 2019-04-09 NOTE — Telephone Encounter (Signed)
Pt wants to let Dr. Valentina Lucks know that he would like to try Jardiance 10mg .  He called his insurance and for a 90 day supply he will pay $125.  Will forward to Dr. Valentina Lucks and PCP.  Christen Bame, CMA

## 2019-04-10 ENCOUNTER — Other Ambulatory Visit: Payer: Self-pay

## 2019-04-10 ENCOUNTER — Encounter: Payer: Self-pay | Admitting: Podiatry

## 2019-04-10 ENCOUNTER — Ambulatory Visit (INDEPENDENT_AMBULATORY_CARE_PROVIDER_SITE_OTHER): Payer: Medicare HMO | Admitting: Podiatry

## 2019-04-10 DIAGNOSIS — M779 Enthesopathy, unspecified: Secondary | ICD-10-CM

## 2019-04-10 DIAGNOSIS — Q828 Other specified congenital malformations of skin: Secondary | ICD-10-CM | POA: Diagnosis not present

## 2019-04-10 DIAGNOSIS — M79672 Pain in left foot: Secondary | ICD-10-CM | POA: Diagnosis not present
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
## 2019-04-13 ENCOUNTER — Telehealth: Payer: Self-pay | Admitting: Pharmacist

## 2019-04-13 ENCOUNTER — Other Ambulatory Visit: Payer: Self-pay | Admitting: Pharmacist

## 2019-04-13 MED ORDER — EMPAGLIFLOZIN 10 MG PO TABS: 10.0000 mg | ORAL_TABLET | Freq: Every day | ORAL | 3 refills | Status: DC

## 2019-04-13 NOTE — Telephone Encounter (Signed)
Called pt on 04/13/19 at 11:50 AM.  Pharmacist Catie Darnelle Maffucci received a VM regarding pt's following concern, "You started me on this Jardiance medicine. I have kidney disease, and I was wondering if that would affect it". Pt states he has done research on Jardiance and saw it can dehydrate him so he will have to stay hydrated by drinking more water; he would like to know if this information is correct. Confirmed with pt that it is important to stay hydrated on this medication and clarified with pt that Jardiance is nephroprotective. Pt verbalized understanding. Pt states he has picked up Jardiance on 04/13/19 from [place] and is tolerating the medication. He states he has noticed an increase in urinating frequency, but notes it is not bothersome. Provided pt with Family Medicine pharmacist phone number [phone]) with instructions to call and speak with pharmacists Janeann Forehand or Drexel Iha regarding medication concerns. He would like 90-day supply sent in to [facility] since he is tolerating medication. Plan to schedule f/u appt with pharmacy team on 06/11/19 at 10:30 AM to assess DM management and obtain updated HbA1c.

## 2019-04-13 NOTE — Telephone Encounter (Signed)
Hi Dr Valentina Lucks, Nonie Hoyer messaged and said he would like to try Jardiance 10mg  for his diabetes following his visit to see you.  He called his insurance and for a 90 day supply he will pay $125. Please could you recommend what I should do? Thank you, appreciate it.

## 2019-04-17 ENCOUNTER — Telehealth: Payer: Self-pay | Admitting: Pharmacist

## 2019-04-17 MED ORDER — METFORMIN HCL ER 500 MG PO TB24: 500.0000 mg | ORAL_TABLET | Freq: Every day | ORAL | 5 refills | Status: DC

## 2019-04-17 NOTE — Telephone Encounter (Signed)
Patient called, reporting intolerance of dizziness, dysphoria.  He requests a return to his old medication, Metformin 500mg  once daily.  He would like to stop the new medication as it is "not agreeing" with him.    I agreed to restart his metformin, send a new prescription and stop the empagliflozin (Jardiance) - added to intolerance list.

## 2019-04-20 NOTE — Telephone Encounter (Signed)
Reviewed: Agree with Dr. Graylin Shiver management.

## 2019-04-30 ENCOUNTER — Telehealth: Payer: Self-pay | Admitting: Family Medicine

## 2019-04-30 NOTE — Telephone Encounter (Signed)
Pt is calling to inform Dr. Posey Pronto that he has received a message on mychart and is not able to access the message. He said he would no longer be using mychart and would like for someone to call him whenever they need to inform him of any information.   I did not see where there was anything recently sent through [facility] but he asks that someone calls to discuss "whatever the message was about"

## 2019-05-01 NOTE — Telephone Encounter (Signed)
I think I had replied to Dr Graylin Shiver message about his diabetic medication. Is it possible that the patient received a notification?

## 2019-05-05 NOTE — Telephone Encounter (Signed)
Informed pt that there was not any messages that I could see for him and did tell him that at his request the MyChart had been deactivated. Adrea Sherpa Zimmerman Rumple, CMA

## 2019-05-11 ENCOUNTER — Telehealth (INDEPENDENT_AMBULATORY_CARE_PROVIDER_SITE_OTHER): Payer: Medicare HMO | Admitting: Family Medicine

## 2019-05-11 ENCOUNTER — Other Ambulatory Visit: Payer: Self-pay

## 2019-05-11 DIAGNOSIS — L2084 Intrinsic (allergic) eczema: Secondary | ICD-10-CM

## 2019-05-11 MED ORDER — BETAMETHASONE DIPROPIONATE 0.05 % EX CREA: TOPICAL_CREAM | Freq: Two times a day (BID) | CUTANEOUS | 0 refills | Status: DC

## 2019-05-11 NOTE — Assessment & Plan Note (Signed)
Will step up to betamethasone--rx sent in.

## 2019-05-11 NOTE — Patient Instructions (Signed)

## 2019-05-11 NOTE — [place] Notes (Signed)
  TELEHEALTH  VIRTUAL VIDEO VISIT ENCOUNTER NOTE  Provider location: [facility]   I connected with Harriett Sine on 05/11/19  at  8:30 AM EST by Doximity Encounter at home and verified that I am speaking with the correct person using two identifiers.   I discussed the limitations, risks, security and privacy concerns of performing an evaluation and management service virtually and the availability of in person appointments. I also discussed with the patient that there may be a patient responsible charge related to this service. The patient expressed understanding and agreed to proceed. Subjective:    Patient ID: Andrew [name] is a 63 y.o. male presenting with No chief complaint on file.  on 05/11/2019  HPI: Has same rash 1x/year. Usually improves with triamcinalone cream. Last refilled on 10/2017. Not working this year. Always worse in winter. Has sarcoid and wonders if it might be related. Began several weeks ago. Notes tremendous itch. Wants something stronger called in.  Review of Systems  Constitutional: Negative for chills and fever.  Respiratory: Negative for shortness of breath.   Cardiovascular: Negative for leg swelling.  Gastrointestinal: Negative for abdominal pain, nausea and vomiting.      Objective:    There were no vitals taken for this visit. Physical Exam Constitutional:      Appearance: Normal appearance.  Pulmonary:     Effort: Pulmonary effort is normal.  Chest:    Skin:    Findings: Rash present.  Neurological:     General: No focal deficit present.     Mental Status: He is alert.    Other PE limited by visit type     Assessment & Plan:   Problem List Items Addressed This Visit      Unprioritized   Intrinsic eczema - Primary    Will step up to betamethasone--rx sent in.       Relevant Medications   betamethasone dipropionate 0.05 % cream      I provided 15 minutes of face-to-face time during this encounter. Does  not want to have to come in office for next A1C appointment.  Return in about 3 months (around 08/11/2019) for a follow-up.  [name] 05/11/2019 9:11 AM

## 2019-05-22 ENCOUNTER — Other Ambulatory Visit: Payer: Self-pay | Admitting: Family Medicine

## 2019-06-08 ENCOUNTER — Other Ambulatory Visit: Payer: Self-pay

## 2019-06-08 MED ORDER — METFORMIN HCL ER 500 MG PO TB24: 500.0000 mg | ORAL_TABLET | Freq: Every day | ORAL | 5 refills | Status: DC

## 2019-06-09 ENCOUNTER — Ambulatory Visit: Payer: Medicare HMO | Admitting: Orthopaedic Surgery

## 2019-06-09 ENCOUNTER — Other Ambulatory Visit: Payer: Self-pay | Admitting: Family Medicine

## 2019-06-09 DIAGNOSIS — L2084 Intrinsic (allergic) eczema: Secondary | ICD-10-CM

## 2019-06-10 ENCOUNTER — Telehealth: Payer: Self-pay | Admitting: *Deleted

## 2019-06-10 NOTE — Telephone Encounter (Signed)
I talked with our lab personnel and we will make this A1c work for this patient on 12/3  I asked him to call me at my office number when he arrives.  I will let the lab tech know.   I will call him after his A1C is resulted.  He verbalized understanding of phone visit for Rx Clinic.

## 2019-06-10 NOTE — Telephone Encounter (Signed)
Patient prefers to not come in the building for his appt tommorow.  He is requesting to pull up and we go out and collect A1C and then he can talk to Dr. Valentina Lucks over the phone.   Will forward to Dr. Valentina Lucks to see if this is an option. Christen Bame, CMA

## 2019-06-11 ENCOUNTER — Other Ambulatory Visit (INDEPENDENT_AMBULATORY_CARE_PROVIDER_SITE_OTHER): Payer: Medicare HMO | Admitting: *Deleted

## 2019-06-11 ENCOUNTER — Telehealth (INDEPENDENT_AMBULATORY_CARE_PROVIDER_SITE_OTHER): Payer: Medicare HMO | Admitting: Pharmacist

## 2019-06-11 ENCOUNTER — Ambulatory Visit: Payer: Medicare HMO | Admitting: Pharmacist

## 2019-06-11 DIAGNOSIS — E1122 Type 2 diabetes mellitus with diabetic chronic kidney disease: Secondary | ICD-10-CM

## 2019-06-11 DIAGNOSIS — N183 Chronic kidney disease, stage 3 unspecified: Secondary | ICD-10-CM

## 2019-06-11 LAB — POCT GLYCOSYLATED HEMOGLOBIN (HGB A1C): HbA1c, POC (controlled diabetic range): 7 % (ref 0.0–7.0)

## 2019-06-11 NOTE — Telephone Encounter (Signed)
Noted, thanks for your input Dr Valentina Lucks

## 2019-06-11 NOTE — Telephone Encounter (Signed)
Noted and agree. 

## 2019-06-11 NOTE — Telephone Encounter (Signed)
Patient visit conducted over telephone (not live in-office visit) at the request of the patient 12/2.   He arrived, called me and we then proceeded with an A1c taken by our lab technician/CMA - result was 7.0  I called patient, shared A1c result and he was surprised this value was not lower.  He reports symptoms of hypoglycemia twice daily.  Symptoms occur mid-late morning after he eats a complete breakfast of eggs, toast, homemade sausage and a beverage  THEN walking ~ 1 mile (readings reported as 100-120).   He also states that he feels low ~ 3:00 (reporting readings with symptoms of ~ 80).   He states he has stopped walking in the evening due to the frequency of "symptomatic lows".   His current medication regimen is metformin XR 500mg  once daily in the morning.  He has not worked with a dietician in the past and is willing to get more input on his diet.  I encouraged him to begin recording a food record.    I plan to schedule visit with Dr. [name], PhD, RD for dietary evaluation and education.   Total time with verbal interaction: 12 minutes.  At this time, I asked him to stop his metformin completely and return to walking TWICE DAILY.

## 2019-06-19 ENCOUNTER — Telehealth: Payer: Self-pay | Admitting: Pharmacist

## 2019-06-19 NOTE — Telephone Encounter (Signed)
Noted and agree. 

## 2019-06-19 NOTE — Telephone Encounter (Signed)
Thanks Dr Valentina Lucks for your valuable input in this patient's care.

## 2019-06-19 NOTE — Telephone Encounter (Signed)
Patient called directly to me and stated he would like to do a trial of the once weekly medication for his diabetes.   He is willing to try the lowest dose0.25mg  of Ozempic (semaglutide) once weekly.   Patient educated on purpose, proper use and potential adverse effects of nausea/vomiting.  Following instruction patient verbalized understanding of treatment plan.   I provided him samples as a drive-through.   Medication Samples have been provided to the patient.  Drug name: Ozempic (semaglutide) 2 ml pen        Qty: 2 (> 3 month supply) LOT: [id number] Exp.Date: 05/08/2021  Dosing instructions: inject 0.25mg  once weekly  The patient has been instructed regarding the correct time, dose, and frequency of taking this medication, including desired effects and most common side effects.   Janeann Forehand 10:18 AM 06/19/2019

## 2019-06-25 ENCOUNTER — Telehealth: Payer: Self-pay | Admitting: *Deleted

## 2019-06-25 NOTE — Telephone Encounter (Signed)
Pt is calling because he has back pain and would like to discuss if it is his arthritis vs prostate cancer.   He would like to discuss having something for the pain.  Explained that in order to discuss pain meds the appt would have to be with PCP and her first available was 07/13/19.  He is agreeable and appt was made.  He is anxious about the prostate and wants to know if there is some lab work that he can come in for before his appt.  To PCP. Christen Bame, CMA

## 2019-06-30 ENCOUNTER — Ambulatory Visit (INDEPENDENT_AMBULATORY_CARE_PROVIDER_SITE_OTHER): Payer: Medicare HMO | Admitting: Family Medicine

## 2019-06-30 ENCOUNTER — Other Ambulatory Visit: Payer: Self-pay

## 2019-06-30 DIAGNOSIS — E119 Type 2 diabetes mellitus without complications: Secondary | ICD-10-CM | POA: Diagnosis not present

## 2019-06-30 NOTE — [place] Notes (Signed)
Telehealth Encounter I connected with Andrew [name] (MRN [id number]) on 06/30/2019 by telephone (patient has been unable to access his MyChart account), verified that I am speaking with the correct person using two identifiers, and that the patient was in a private environment conducive to confidentiality.  The patient agreed to proceed.  Provider was Kennith Center, PhD, RD, LDN, CEDRD Provider(s) located at [facility] during this telehealth encounter; patient was at home  Appt start time: 1500 end time: 1600 (1 hour)  Reason for telehealth visit: Medical Nutrition Therapy for blood glucose management.   Relevant history/background: Andrew [name] has type 2 diabetes, and would like to normalize his A1C, and to discontinue his BG medications.  A1C on 06/11/19 was 7.0.  He has problems with hypoglycemic events when he takes his Other relevant diagnoses include h./o stroke, CKD stage 3, hypertension, hyperlipidemia, and gout.    Assessment: Andrew [name] has had frequent hypoglycemic events.  He uses either 3-4 oz apple juice or some 4-mg glucose tab's when they occur.   Usual eating pattern: 2 meals and 2 snacks per day. Frequent foods and beverages: water, coffee w/ Splenda (5-6 pkt/cup=10-12 tsp sugar), green tea; 1 [place] sausage patty, fried chx, fried fish, veg's, fruit. Avoided foods: Trying to avoid starchy foods.   Usual physical activity: Walks 1 mile 6 X wk.  Recently has walked less b/c BG has dropped to ~100 after walking, and he feels hypoglycemic w/ levels below 120-130.   Sleep: Estimates he gets 6-7 hours per night.  Up to urinate 3-4 X night.  Also usually gets up to check BG in the night, which is usually low, so he then drinks 3-4 oz apple juice.   24-hr recall:  (Up at 5 AM; checked FBG, but could not determine ) B (6 AM)-  1 c coffee with 6 pkts Splenda, 2 c Raisin Bran, 1 c 2% milk Snk (7 AM)-  1 bottle Boost  Snk (10 AM)-  1 green tea L (12 PM)-  1/2 c  pigeon peas & rice, 1 chx leg w/ skin, 1 c cabbage & carrots (cooked with chx grease), water Snk (1 PM)-  1 bottle Boost  - BG dropped, so had 1 glucose tab multiple times between 11 AM till ~5 PM -  D ( PM)-  --- Snk ( PM)-  water Typical day? Yes.    Intervention: Completed diet and physical activity history, and established behavioral goals.  Spent a good deal of time explaining why Andrew [name] usual erratic eating pattern and skipping meals has contributed to his hypoglycemia, and why real food will help him manage both his weight and BG rather than relying on nutritional drinks, apple juice, and glucose tablets.  Provided handouts and AVS by mail, as patient says he has given up on trying to gain MyChart access.       For recommendations and goals, see Patient Instructions.    Follow-up: Phone telehealth visit in 3 weeks.    Loveda Colaizzi,JEANNIE

## 2019-06-30 NOTE — Patient Instructions (Addendum)
  Diet Recommendations for Diabetes  Carbohydrate includes starch, sugar, and fiber.  Of these, only sugar and starch raise blood glucose.  (Fiber is found in fruits, vegetables [especially skin, seeds, and stalks], whole grains, and beans.)   Starchy (carb) foods: Bread, rice, pasta, potatoes, corn, cereal, grits, crackers, bagels, muffins, all baked goods.  (Fruit, milk, and yogurt also have carbohydrate, but most of these foods will not spike your blood sugar as most starchy foods will.)  A few fruits do cause high blood sugars; use small portions of bananas (limit to 1/2 at a time), grapes, watermelon, oranges, and most tropical fruits.   Protein foods: Meat, fish, poultry, eggs, dairy foods, and beans such as pinto and kidney beans (beans also provide carbohydrate).   1. Eat at least 3 REAL meals and 1-2 snacks per day.  Eat breakfast within the first hour of getting up.  Have something to eat at least every 3-5 hours while awake.   2. Limit starchy foods to TWO per meal and ONE per snack. ONE portion of a starchy food is equal to the following:   - ONE slice of bread (or its equivalent, such as half of a hamburger bun).   - 1/2 cup of a "scoopable" starchy food such as potatoes or rice.   - 15 grams of Total Carbohydrate as shown on food label.   - Every 4 ounces of a sweet drink (including fruit juice). 3. Include at every meal: a protein food, a starch food, and vegetables and/or fruit.   - Obtain twice the volume of veg's as protein or carbohydrate foods for both lunch and dinner.   - Fresh or frozen veg's are best.   - Keep frozen veg's on hand for a quick vegetable serving.     - If you eat a breakfast of 2 eggs, 2 slices of toast, and 1 sausage patty, that gives you about 30 grams of carbohydrate.  Two cups of Raisin Bran provides ~90 grams of carbohydrate.     - Complete the Meal Planning form provided today, designing at least 5 dinner meals.   - Use the Blood Glucose Record form.   Each time you check your blood sugar, record that number on your form along with the date and time.  The most important time to check is fasting.    - Follow-up appt by phone on Tuesday, Jan 12 at 2 PM.  Please have your Meal Planning form and Blood Glucose Record handy for this appointment.    AT FOLLOW-UP WE WILL REVIEW YOUR PROGRESS ON THE 3 GOALS ABOVE, DISCUSS YOUR MEAL PLANS, AND TALK ABOUT THE USE OF ARTIFICIAL SWEETENERS.

## 2019-07-01 ENCOUNTER — Telehealth: Payer: Self-pay | Admitting: Family Medicine

## 2019-07-01 NOTE — Telephone Encounter (Signed)
Called pt regarding back pain and his concerns for prostate cancer.  Explained that it is difficult for me to assess his back pain and the likelihood of prostate cancer over the phone. However I explained that his back pain sounds like it is related to arthritis.  Patient has an appointment on 4th January with me. I explained that we can do a PSA blood test which is a marker of prostate cancer. I explained that this is not a conclusive test and is high in patients with prostate cancer but not necessarily a marker of prostate cancer.  Recommended that patient takes Tylenol, uses heat pads in the meantime for his back pain and also keeps active and does core strengthening exercises which will inevitably help with back pain.  Andrew [name] was happy with this plan and is happy to see me 4th January 2021.

## 2019-07-07 ENCOUNTER — Telehealth: Payer: Self-pay | Admitting: Pharmacist

## 2019-07-07 NOTE — Telephone Encounter (Signed)
Noted and agree. 

## 2019-07-07 NOTE — Telephone Encounter (Signed)
Contacted patient RE blood sugar control and use of Ozempic (Semaglutide).   Patient reports working on his diet with assistance of [name].  She reports no low sugar readings or symptoms recently. He has another visit with [name] 07/20/2018.   We discussed dosing of Ozempic and I clarified that he should continue taking 0.5mg  once weekly at this time.  He appears to have ~ 1 month supply remaining.    I plan to call him in 3-4 weeks to reevaluate blood sugar control and consider new prescription for higher dose 1mg  weekly dose of Ozempic.

## 2019-07-13 ENCOUNTER — Encounter: Payer: Self-pay | Admitting: Family Medicine

## 2019-07-13 ENCOUNTER — Ambulatory Visit (INDEPENDENT_AMBULATORY_CARE_PROVIDER_SITE_OTHER): Payer: Medicare HMO | Admitting: Family Medicine

## 2019-07-13 ENCOUNTER — Other Ambulatory Visit: Payer: Self-pay

## 2019-07-13 VITALS — BP 118/72 | HR 73 | Ht 73.0 in | Wt 208.6 lb

## 2019-07-13 DIAGNOSIS — R3914 Feeling of incomplete bladder emptying: Secondary | ICD-10-CM

## 2019-07-13 DIAGNOSIS — N401 Enlarged prostate with lower urinary tract symptoms: Secondary | ICD-10-CM

## 2019-07-13 DIAGNOSIS — M545 Low back pain, unspecified: Secondary | ICD-10-CM

## 2019-07-13 DIAGNOSIS — G8929 Other chronic pain: Secondary | ICD-10-CM | POA: Diagnosis not present

## 2019-07-13 DIAGNOSIS — M549 Dorsalgia, unspecified: Secondary | ICD-10-CM | POA: Diagnosis not present

## 2019-07-13 MED ORDER — TAMSULOSIN HCL 0.4 MG PO CAPS: 0.8000 mg | ORAL_CAPSULE | Freq: Every day | ORAL | 3 refills | Status: DC

## 2019-07-13 MED ORDER — DICLOFENAC SODIUM 1 % EX GEL: 2.0000 g | Freq: Two times a day (BID) | CUTANEOUS | 3 refills | Status: DC

## 2019-07-13 NOTE — Telephone Encounter (Signed)
Request also received for Trazodone 50 MG tabs, Take 1 tablet at bedtime as needed for sleep. Did not see this on pt med list. Ottis Stain, [place]

## 2019-07-13 NOTE — Telephone Encounter (Signed)
Noted thanks Dr Valentina Lucks.

## 2019-07-13 NOTE — Assessment & Plan Note (Addendum)
Back pain likely 2/2 to degenerative changes and worsened by the cold temperatures. No obvious red flags. Prescribed Voltaren gel advised to apply to affected area.  Patient would like to continue drinking ginger and turmeric and will continue his exercises and heat pads PRN.  Offered ambulatory referral to physical therapy however patient declined this for now.  Will consider reimaging if symptoms persist.

## 2019-07-13 NOTE — Progress Notes (Signed)
   Subjective:    Patient ID: Andrew [name], male    DOB: February 07, 1956, 64 y.o.   MRN: [id number]   CC: Andrew [name] is a 64 yr old male who presents today for a follow up for his back pain  HPI:  Back pain Pt has suffered from chronic lower back pain for many year.  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 He has noticed lower back pain flaring up in the winter, colder months and has been relatively asymptomatic through the summer. Has been drinking turmeric and ginger which has been helping a lot and wants to continue this.  Has also tried heat pads and gentle exercises per my recommendations a few weeks ago which has helped a little. Has PRN tramadol which he takes very infrequently and does not want to take this long term as has suffered withdrawal symptoms in the past such as insomnia, feeling on edge etc. Denies recent trauma to back, sensory/motor loss, bladder/bowel incontinence, saddle anaesthesia, change in anal sphincter tone.   BPH Pt takes Tamsulosin 0.4mg  started last year has helped with urinary symptoms however still reports nocturia and incomplete bladder emptying. Denies hematuria. Would like a PSA test as a relative recently passed away from prostate cancer and is concerned about this. Denies weight loss or fatigue.   Smoking status reviewed   ROS: pertinent noted in the HPI    Past medical history, surgical, family, and social history reviewed and updated in the EMR as appropriate. Reviewed problem list.   Objective:  BP 118/72   Pulse 73   Ht 6\' 1"  (1.854 m)   Wt 208 lb 9.6 oz (94.6 kg)   SpO2 100%   BMI 27.52 kg/m   Vitals and nursing note reviewed  General: NAD, pleasant, able to participate in exam Cardiac: RRR, S1 S2 present. normal heart sounds, no murmurs. Respiratory: CTAB, normal effort, No wheezes, rales or rhonchi Extremities: no edema or cyanosis.  5/5 strength in LE. Normal sensation in LE. No saddle anaesthesia.  Skin: warm and  dry, no rashes noted Neuro: alert, no obvious focal deficits Psych: Normal affect and mood   Assessment & Plan:    BPH (benign prostatic hyperplasia) Likely poorly controlled BPH.  Increased tamsulosin to 0.8 mg today.  Counseled patient on lowering blood pressure affect of tamsulosin and advised to reduce the dose back to 0.4 mg if he feels dizzy and to let me know if this happens.  Explained to patient that could start finasteride in the future and refer to urology if urinary symptoms persist.  Patient would like to continue increased dose of tamsulosin first and urology referral.  Back pain Back pain likely 2/2 to degenerative changes and worsened by the cold temperatures. No obvious red flags. Prescribed Voltaren gel advised to apply to affected area.  Patient would like to continue drinking ginger and turmeric and will continue his exercises and heat pads PRN.  Offered ambulatory referral to physical therapy however patient declined this for now.  Will consider reimaging if symptoms persist.   Lattie Haw, MD  [place] PGY-1

## 2019-07-13 NOTE — Assessment & Plan Note (Signed)
Likely poorly controlled BPH.  Increased tamsulosin to 0.8 mg today.  Counseled patient on lowering blood pressure affect of tamsulosin and advised to reduce the dose back to 0.4 mg if he feels dizzy and to let me know if this happens.  Explained to patient that could start finasteride in the future and refer to urology if urinary symptoms persist.  Patient would like to continue increased dose of tamsulosin first and urology referral.

## 2019-07-13 NOTE — Patient Instructions (Addendum)
Andrew [name],  It was lovely to see you today! I am pleased that the tamsulosin has been helping however you are still reporting some urinary symptoms at night.  I am increasing your tamsulosin to 0.8mg  (double the dose of what you are taking right now).  Please take this and let me know how you are getting on.  If you become dizzy/woozy please stop and go back to your original dose of 0.4 mg and please let me know.  We have checked a PSA today and I will be in touch with you with the result.  For your back pain I have prescribed you Voltaren gel. Continue the home remedies for your back pain.  If the volteren gel does not help we can consider physical therapy.  I imagine that your symptoms will improve by the summer as the temperature increases.   I look forward to seeing you again and keep up the great work!  Best wishes,  Dr Posey Pronto

## 2019-07-14 ENCOUNTER — Telehealth: Payer: Self-pay | Admitting: *Deleted

## 2019-07-14 DIAGNOSIS — H401131 Primary open-angle glaucoma, bilateral, mild stage: Secondary | ICD-10-CM | POA: Diagnosis not present

## 2019-07-14 LAB — PSA: Prostate Specific Ag, Serum: 1 ng/mL (ref 0.0–4.0)

## 2019-07-14 NOTE — Telephone Encounter (Signed)
Received fax requesting clarification for doclofenac sod 1% gel 100GM with a quantity of #2 grams.  This medication is packaged as #100 grams and cannot be broken. Please clarify the quantity to dispense.  Placed the papers in your box in case you need them for reference.Avila Albritton Zimmerman Rumple, CMA

## 2019-07-15 ENCOUNTER — Other Ambulatory Visit: Payer: Self-pay | Admitting: Family Medicine

## 2019-07-15 ENCOUNTER — Telehealth: Payer: Self-pay | Admitting: Family Medicine

## 2019-07-15 MED ORDER — DICLOFENAC SODIUM 1 % EX GEL: 2.0000 g | Freq: Two times a day (BID) | CUTANEOUS | 2 refills | Status: DC

## 2019-07-15 NOTE — Telephone Encounter (Signed)
Hi April I have represcribed the diclofenac gel for Mr Andrew [name] with quantity. Thank you.

## 2019-07-15 NOTE — Telephone Encounter (Signed)
Called pt to let him know his PSA results which was normal. Explained the benefits and risks of measuring PSA. I explained that we can monitor his PSA and recheck it in 2 years given he is higher risk due to being Afro-American. Pt agreed and would like to be tested in 2 years time.

## 2019-07-21 ENCOUNTER — Telehealth: Payer: Medicare HMO | Admitting: Family Medicine

## 2019-07-24 ENCOUNTER — Telehealth: Payer: Self-pay

## 2019-07-24 ENCOUNTER — Telehealth: Payer: Self-pay | Admitting: Family Medicine

## 2019-07-24 NOTE — Telephone Encounter (Signed)
Called pt this afternoon after receiving a message that he has been experiencing hypoglycemic sx. Pt reports he wants to the "specialist" referring to Dr Valentina Lucks for management for his Ozempic. He is taking 0.5mg  Ozempic and CBGs have been 100-150. Has been frequently experiencing hypoglycemic sx when CBGs<130. Exercising this morning made him feel worse. Has been taking "sugar tablets" regularly" with his hypoglycemic sx. Reports eating an adequate diet with enough carbohydrates. Recommended that he should omit his next dose of Ozempic due in one week and book urgent visit at [facility] next week for DM f/u. We might have to discontinue Ozempic and his CBGs are well controlled and last A1c was 7. Also to eat plenty of carbohydrates and sugary foods to avoid hypoglycemic sx in the meantime. If he experiences recurrent CBGs 60-100, he should go to the ER urgently for IV management of hypoglycemic. Pt understood potentially fatal risks of hypoglycemia and is happy with this plan. Will f/u with patient next week.

## 2019-07-24 NOTE — Telephone Encounter (Signed)
Pt calls nurse line requesting referral to specialist regarding issues with blood sugar management. Per patient, he continues to have issues with blood sugar dropping low and experiencing intermittent dizziness. Pt would like to be seen by specialist to see why this is continuing to happen.  Please advise  To PCP Talbot Grumbling, RN

## 2019-07-28 ENCOUNTER — Ambulatory Visit (INDEPENDENT_AMBULATORY_CARE_PROVIDER_SITE_OTHER): Payer: Medicare HMO | Admitting: Family Medicine

## 2019-07-28 ENCOUNTER — Encounter: Payer: Self-pay | Admitting: Family Medicine

## 2019-07-28 ENCOUNTER — Other Ambulatory Visit: Payer: Self-pay

## 2019-07-28 VITALS — BP 144/84 | HR 62 | Wt 214.2 lb

## 2019-07-28 DIAGNOSIS — E119 Type 2 diabetes mellitus without complications: Secondary | ICD-10-CM | POA: Diagnosis not present

## 2019-07-28 LAB — GLUCOSE, POCT (MANUAL RESULT ENTRY): POC Glucose: 195 mg/dl — AB (ref 70–99)

## 2019-07-28 MED ORDER — METFORMIN HCL ER 500 MG PO TB24: 500.0000 mg | ORAL_TABLET | Freq: Every day | ORAL | 0 refills | Status: DC

## 2019-07-28 NOTE — Progress Notes (Cosign Needed)
   Subjective:    Patient ID: Andrew [name], male    DOB: 1955/09/13, 64 y.o.   MRN: [id number]   CC: Andrew [name] is a 64 yr old male who presents today for f/u of CBGs  HPI:  Hypolgycemic symptoms  Pt has T2DM, last A1c 7 in December 2020.  Currently takes Ozempic 0.5mg  once weekly which he started taking in December 2020 per Dr Valentina Lucks.  In the recent past patient has taken Metformin and Victoza.  Patient reports that he has always had hypoglycemic symptoms even with the Metformin and Victoza but more so since starting Ozempic.  He has been seeing [name] for nutrition management.  Received phone call from patient last week reporting CBGs under 130 causing him to feel hypoglycemic and needing to take more of his emergency glucose tablets and juice.  Patient keeps an emergency carton of juice in his car as he is so scared to become hypoglycemic when he is out.  I recommended that he increase carbohydrate intake until his appointment today.  CBGs have been between 90 and 150.  Patient adamant that he would like to stop Ozempic today.  Smoking status reviewed   ROS: pertinent noted in the HPI    Past medical history, surgical, family, and social history reviewed and updated in the EMR as appropriate. Reviewed problem list.   Objective:  BP (!) 144/84   Pulse 62   Wt 214 lb 3.2 oz (97.2 kg)   SpO2 100%   BMI 28.26 kg/m   Vitals and nursing note reviewed  General: NAD, pleasant Cardiac: Well-perfused Respiratory: No respiratory distress Neuro: alert, no obvious focal deficits Psych: Normal affect and mood   Assessment & Plan:    Controlled diabetes mellitus ([place]) Diabetes well controlled with recent A1c of 7, however patient experiencing frequent hypoglycemic symptoms with Ozempic.  Dr. Valentina Lucks was present during this encounter who recommended reducing dose Ozempic to 0.25 mg or restarting Metformin and discontinue Ozempic. Patient was adamant that he did not want to the  Ozempic due to the fear of recurrent hypoglycemia.  Counseled patient on adequate intake of fiber and carbohydrate to sustain blood glucose levels.  Also counseled patient on what to do when CBGs <80. -Discontinue Ozempic -Start Metformin 500 mg from 22nd January (when next dose of Ozempic was due) -Follow-up with me next week -F/U with [name] for nutrition   Lattie Haw, MD  [place] PGY-1

## 2019-07-28 NOTE — Patient Instructions (Addendum)
Andrew [name],  It was lovely to see you today!  Sorry that you have been having trouble with your low sugar symptoms.  I have discontinued the Ozempic and restarted the Metformin 500mg  once daily.  Please continue to have a well-balanced diet including plenty of vegetables, adequate carbohydrate and protein sources. Always keep sugary food drinks/foods with you in case your sugars are low. I look forward to seeing you next week for a follow-up.  Please call me if you have any further questions or concerns.  Best wishes, Dr Posey Pronto

## 2019-07-28 NOTE — Assessment & Plan Note (Signed)
Diabetes well controlled with recent A1c of 7, however patient experiencing frequent hypoglycemic symptoms with Ozempic.  Dr. Valentina Lucks was present during this encounter who recommended reducing dose Ozempic to 0.25 mg or restarting Metformin and discontinue Ozempic. Patient was adamant that he did not want to the Ozempic due to the fear of recurrent hypoglycemia.  Counseled patient on adequate intake of fiber and carbohydrate to sustain blood glucose levels.  Also counseled patient on what to do when CBGs <80. -Discontinue Ozempic -Start Metformin 500 mg from 22nd January (when next dose of Ozempic was due) -Follow-up with me next week -F/U with [name] for nutrition

## 2019-07-29 ENCOUNTER — Telehealth: Payer: Self-pay

## 2019-07-29 NOTE — Telephone Encounter (Signed)
Patient calls nurse line requesting a prescription for a continuous glucose monitoring system. Please advise.

## 2019-07-30 NOTE — Telephone Encounter (Signed)
Could you please clarify what he means by continuous glucose monitoring? He also measures his CBGs at home with a glucose monitor.

## 2019-07-30 NOTE — Telephone Encounter (Signed)
He's referring to the Dexcom I believe. Valentina Lucks also helps set people up with them. We can just schedule a pharmacy visit.

## 2019-08-03 NOTE — Telephone Encounter (Signed)
Hi Paige, Please schedule this pt for a f/u with Dr Valentina Lucks at his convenience. He can also speak about Dexcom then. Thank you.

## 2019-08-04 NOTE — Telephone Encounter (Signed)
Attempted to call patient to schedule with [place]. I LVM to have him return call. Please schedule with Koval.

## 2019-08-05 ENCOUNTER — Encounter: Payer: Self-pay | Admitting: Family Medicine

## 2019-08-05 ENCOUNTER — Ambulatory Visit (INDEPENDENT_AMBULATORY_CARE_PROVIDER_SITE_OTHER): Payer: Medicare HMO | Admitting: Family Medicine

## 2019-08-05 ENCOUNTER — Other Ambulatory Visit: Payer: Self-pay

## 2019-08-05 DIAGNOSIS — I10 Essential (primary) hypertension: Secondary | ICD-10-CM | POA: Diagnosis not present

## 2019-08-05 DIAGNOSIS — N183 Chronic kidney disease, stage 3 unspecified: Secondary | ICD-10-CM | POA: Diagnosis not present

## 2019-08-05 DIAGNOSIS — E1122 Type 2 diabetes mellitus with diabetic chronic kidney disease: Secondary | ICD-10-CM | POA: Diagnosis not present

## 2019-08-05 NOTE — Assessment & Plan Note (Addendum)
BP at goal. Continue Amlodipine 5mg  and Enalapril 20mg .

## 2019-08-05 NOTE — Progress Notes (Signed)
   Subjective:    Patient ID: Andrew [name], male    DOB: 12/23/1955, 64 y.o.   MRN: [id number]   CC: Andrew [name] is a 64 year old male who presents to clinic today for diabetes follow-up  HPI:  Type 2 diabetes Discontinued Ozempic last week due to patient's hypoglycemic symptoms and started on Metformin 500 mg XR which pt has been taking for 5 days. Recent CBGs have been 140, 154, 170 and overall feels better. Has only had one reading of <130 which is where pt usually experiences hypoglycemic. Has not experienced night time hypos which he was experiencing with Ozempic. Is eating a balanced diet. Has gained weight over last few months and wants to get down to 180lb. Wants Dexcom after seeing advert on TV. Does not like doing POCT glucose testing.Has spoken to Dr Jenne Campus earlier this month, he is waiting to see her for nutrition management. Had cut back on exercise because of his hypoglycemic sx, wants to start walking again.   Hypertension Patient currently takes Amlodipine 5 mg and Enalapril 20 mg.  Blood pressure today 138/72.  Compliant with medication and tolerating well.  Denies side effects. Reports did get ankle edema with Amlodipine on 10mg  which improved following dose reduction. Denies chest pain, dizziness, palpitations or edema.  Smoking status reviewed   ROS: pertinent noted in the HPI   Past medical history, surgical, family, and social history reviewed and updated in the EMR as appropriate. Reviewed problem list.   Objective:  BP 138/72   Pulse 66   SpO2 98%   Vitals and nursing note reviewed  General: NAD, pleasant, able to participate in exam Cardiac: RRR, S1 S2 present. normal heart sounds, no murmurs. Respiratory: CTAB, normal effort, No wheezes, rales or rhonchi Extremities: no edema or cyanosis. Skin: warm and dry, no rashes noted Neuro: alert, no obvious focal deficits Psych: Normal affect and mood   Assessment & Plan:    Controlled diabetes mellitus ([place])  Diabetes at goal and pt is not having hypoglycemic symptoms on Metformin. -Continue Metformin  500mg  XR once daily -A1c in 2 months -F/U with Dr Jenne Campus for nutrition management -F/U with Dr Valentina Lucks tomorrow for discussion for Dexcom.  Essential hypertension BP at goal. Continue Amlodipine 5mg  and Enalapril 20mg .   Andrew Haw, MD  [place] PGY-1

## 2019-08-05 NOTE — Patient Instructions (Signed)
Mr Jacka,  It was lovely to meet you again today!  I am glad you are doing much better on the metformin and not having any low sugar symptoms.  Continue on the current dose of Metformin and we can consider increasing Metformin by me next check your A1c.  I will refer you to Dr. Valentina Lucks for the glucometer.  We are awaiting Dr. Dionisio Paschal input for your diet. Please continue with diet as you are for now.  Your next visit we can talk about your weight and things we can do to reduce it but for now you should blood sugar stable is the priority. Your blood pressure looks great, continue on the same blood pressure medications. Keep up the the great work and I will see you next time!  Best wishes,  Dr. Posey Pronto

## 2019-08-05 NOTE — Assessment & Plan Note (Signed)
Diabetes at goal and pt is not having hypoglycemic symptoms on Metformin. -Continue Metformin  500mg  XR once daily -A1c in 2 months -F/U with Dr Jenne Campus for nutrition management -F/U with Dr Valentina Lucks tomorrow for discussion for Dexcom.

## 2019-08-06 ENCOUNTER — Ambulatory Visit: Payer: Medicare HMO | Admitting: Pharmacist

## 2019-08-13 ENCOUNTER — Other Ambulatory Visit: Payer: Self-pay | Admitting: Family Medicine

## 2019-08-13 ENCOUNTER — Telehealth: Payer: Self-pay | Admitting: *Deleted

## 2019-08-13 NOTE — Telephone Encounter (Signed)
Pt states that the diclofenac gel is not helping his back.   He had some tramadol and tried one of those and it "helped tremendously".  He is now requesting a refill on tramadol. Christen Bame, CMA

## 2019-08-13 NOTE — Telephone Encounter (Signed)
I would recommend Andrew [name] be seen in clinic for further assessment of his back pain if he is requiring more tramadol. I have an appointment on 10th if he can wait until then. Otherwise can see another provider for refill after assessment.

## 2019-08-17 NOTE — Telephone Encounter (Signed)
Pt scheduled for appt on 2/16. Offered to schedule him sooner with another Dr. Abbott [name] said he would like to wait and see Dr. Posey [name]. Ottis Stain, CMA

## 2019-08-18 ENCOUNTER — Telehealth: Payer: Self-pay

## 2019-08-18 NOTE — Telephone Encounter (Signed)
Patient calls nurse line requesting rx for meloxicam. Patient states that he would prefer this medication to tramadol in treatment for osteoarthritis.   To PCP  Please advise  Talbot Grumbling, RN

## 2019-08-19 ENCOUNTER — Other Ambulatory Visit: Payer: Self-pay | Admitting: Family Medicine

## 2019-08-19 DIAGNOSIS — M549 Dorsalgia, unspecified: Secondary | ICD-10-CM

## 2019-08-19 DIAGNOSIS — G8929 Other chronic pain: Secondary | ICD-10-CM

## 2019-08-19 MED ORDER — MELOXICAM 7.5 MG PO TABS: 7.5000 mg | ORAL_TABLET | Freq: Every day | ORAL | 0 refills | Status: DC

## 2019-08-19 NOTE — Telephone Encounter (Signed)
Great thank you Nehemiah Settle.

## 2019-08-19 NOTE — Telephone Encounter (Signed)
Pt informed and very appreciative Ottis Stain, CMA

## 2019-08-19 NOTE — Telephone Encounter (Signed)
Prescribed Andrew [name] 7 day supply of meloxicam which should be enough until his appointment with me on 16th.

## 2019-08-24 ENCOUNTER — Other Ambulatory Visit: Payer: Self-pay | Admitting: Family Medicine

## 2019-08-25 ENCOUNTER — Ambulatory Visit (INDEPENDENT_AMBULATORY_CARE_PROVIDER_SITE_OTHER): Payer: Medicare HMO | Admitting: Family Medicine

## 2019-08-25 ENCOUNTER — Other Ambulatory Visit: Payer: Self-pay | Admitting: Family Medicine

## 2019-08-25 ENCOUNTER — Encounter: Payer: Self-pay | Admitting: Family Medicine

## 2019-08-25 ENCOUNTER — Other Ambulatory Visit: Payer: Self-pay

## 2019-08-25 VITALS — BP 132/62 | HR 76 | Wt 212.8 lb

## 2019-08-25 DIAGNOSIS — E1122 Type 2 diabetes mellitus with diabetic chronic kidney disease: Secondary | ICD-10-CM | POA: Diagnosis not present

## 2019-08-25 DIAGNOSIS — G8929 Other chronic pain: Secondary | ICD-10-CM | POA: Diagnosis not present

## 2019-08-25 DIAGNOSIS — N183 Chronic kidney disease, stage 3 unspecified: Secondary | ICD-10-CM | POA: Diagnosis not present

## 2019-08-25 DIAGNOSIS — M549 Dorsalgia, unspecified: Secondary | ICD-10-CM

## 2019-08-25 MED ORDER — TAMSULOSIN HCL 0.4 MG PO CAPS: 0.4000 mg | ORAL_CAPSULE | Freq: Every day | ORAL | 2 refills | Status: DC

## 2019-08-25 NOTE — Patient Instructions (Signed)
Andrew [name],  It was lovely to see you today.  I am glad the meloxicam is helped with your back pain.  Please continue to take this when your back pain is severe.  When you run out please contact me.  This is not a medication I want to prescribe with your long-term as it can cause bleeding in the stomach.  I have referred you to physical therapy and hopefully they can help with your back pain.  It is letter to the degeneration in the spine.  I will see you next month for follow-up of your diabetes we will do an A1c then.    Take care and best wishes,  Dr. Posey Pronto

## 2019-08-25 NOTE — Progress Notes (Signed)
   CHIEF COMPLAINT / HPI: Back pain and diabetes follow up   Back pain  Pt has had chronic lower back pain for several years which usually worsens in the winter months. Acutely worsened 3 weeks ago and has become 10/10 severity. Describes as ache like "bad tooth ache" and is often worse at night. Andrew [name] with friend who has tried meloxicam which had helped with her arthritic pain and pt asked me to prescribe him Meloxicam for him last week. Pt took 2 tablets of Meloxicam which helped him a lot. Has not needed to take it again. Is using it with caution. Recently tried tramadol, heat patch, diclofenac gel, tylenol, heating pads etc without relief. Has been doing stretching exercise daily which help a little. Denies recent trauma or injury. Denies IV drug use or steroid use. No hx of cancer. Denies bowel/bladder incontinence, numbness, fevers, weight loss or rash. Does not want to take Tramadol for the pain as is worried about becoming addicted to it and did not feel good when he stopped taking it before.  Diabetes  Last A1c in Dec 2020 was 7. Pt is doing well on Metformin 500mg  once daily. Measures CBGs at home and showed me his CBGS on glucometer. Fasting CBGs 150-200 and post prandial CBGS >200. Would like to continue on Metformin and does not wish to go back on SGLT2 ihibitor due to side effects. Adhering to healthier diet per Dr Jenne Campus with good amounts of carbohydrates, protein and vegetables. Denies hypoglycemic episiodes.  PERTINENT  PMH / PSH:  DM, HTN, HLD, Arthritis, chronic back pain   OBJECTIVE: BP 132/62   Pulse 76   Wt 212 lb 12.8 oz (96.5 kg)   SpO2 99%   BMI 28.08 kg/m    General: Pleasant, no acute distress, well appearing male MSK: Normal gait, able to ambulate to exam table without difficulty, normal balance, normal skin, normal alignment of spine, no tenderness on vertebral palpation. Paraspinal muscles are not tender. Normal ROM at sacral region and cervical region. Normal  sensation in LE and no saddle anesthesia.  ASSESSMENT / PLAN:  Back pain Back pain likely 2/2 degenerative changes worsened by cold weather. Imaging of lumbar spine in 2019 showed L4-L5 degeneration. Red flags excluded and low suspicion for cauda equina, cancer, fracture etc. Given improvement in patient's pt with Meloxicam, can continue Meloxicam. Pt still has 5 tablets left. Explained side effects of long term use of NSAIDs ie GI bleed, kidney damage etc. Pt understood.  -Continue Meloxicam. If pt requires more, consider starting PPI and check BMP. -Referral to PT -F/U with me in March   Controlled diabetes mellitus ([place]) Diabetes well controlled, no hypoglycemic symptoms. Due for A1c in March. -Continue Metformin      Andrew Haw, MD [place]

## 2019-08-27 NOTE — Assessment & Plan Note (Signed)
Back pain likely 2/2 degenerative changes worsened by cold weather. Imaging of lumbar spine in 2019 showed L4-L5 degeneration. Red flags excluded and low suspicion for cauda equina, cancer, fracture etc. Given improvement in patient's pt with Meloxicam, can continue Meloxicam. Pt still has 5 tablets left. Explained side effects of long term use of NSAIDs ie GI bleed, kidney damage etc. Pt understood.  -Continue Meloxicam. If pt requires more, consider starting PPI and check BMP. -Referral to PT -F/U with me in March

## 2019-08-27 NOTE — Assessment & Plan Note (Signed)
Diabetes well controlled, no hypoglycemic symptoms. Due for A1c in March. -Continue Metformin

## 2019-09-11 ENCOUNTER — Encounter: Payer: Self-pay | Admitting: Family Medicine

## 2019-09-11 ENCOUNTER — Other Ambulatory Visit: Payer: Self-pay

## 2019-09-11 ENCOUNTER — Ambulatory Visit (INDEPENDENT_AMBULATORY_CARE_PROVIDER_SITE_OTHER): Payer: Medicare HMO | Admitting: Family Medicine

## 2019-09-11 VITALS — BP 134/78 | HR 68 | Temp 98.4°F | Wt 212.2 lb

## 2019-09-11 DIAGNOSIS — N183 Chronic kidney disease, stage 3 unspecified: Secondary | ICD-10-CM

## 2019-09-11 DIAGNOSIS — I10 Essential (primary) hypertension: Secondary | ICD-10-CM

## 2019-09-11 DIAGNOSIS — E1122 Type 2 diabetes mellitus with diabetic chronic kidney disease: Secondary | ICD-10-CM

## 2019-09-11 LAB — POCT GLYCOSYLATED HEMOGLOBIN (HGB A1C): HbA1c, POC (controlled diabetic range): 7.7 % — AB (ref 0.0–7.0)

## 2019-09-11 MED ORDER — METFORMIN HCL ER 500 MG PO TB24: 1000.0000 mg | ORAL_TABLET | Freq: Every day | ORAL | 3 refills | Status: DC

## 2019-09-11 NOTE — Progress Notes (Signed)
    SUBJECTIVE:   CHIEF COMPLAINT / HPI:   Andrew [name] is a 64 year old male who presents today for diabetic follow-up.  Diabetes A1c today 7.7, increased from 7 in December 2020.  Patient is currently taking Metformin 500 mg once daily. Tolerating medications well.  Denies hypoglycemic symptoms.  Denies blurred vision, polyuria or polydipsia.  Patient reports his diet could improve and reports that he is eating the healthiest diet at the moment.  Current diet consists of a lot of pork, [place] and pies.  Eats eggs and vegetables sometimes and has switched from sugar to artificial sweeteners like Splenda.  Has gained weight over the last year due to Covid 19 and living a more sedentary lifestyle.  Would like to work on losing weight now now that he is not experiencing hypoglycemic symptoms from the diabetic medications.  Hypertension BP today 134/78. Currently taking amlodipine 5 mg and enalapril 20 mg.  Tolerating well denies side effects.  Denies chest pain, shortness of breath, dizziness or edema.   PERTINENT  PMH / PSH: Hypertension, diabetes, chronic back pain  OBJECTIVE:   BP 134/78   Pulse 68   Temp 98.4 F (36.9 C) (Oral)   Wt 212 lb 3.2 oz (96.3 kg)   SpO2 98%   BMI 28.00 kg/m    General: Pleasant, well-appearing 64 year old male Cardio: Normal S1 and S2, RRR.  No murmurs or rubs.   Pulm: CTAB, normal work of breathing Abdomen: Bowel sounds normal. Abdomen soft and non-tender.  Extremities: No peripheral edema. Warm/ well perfused   ASSESSMENT/PLAN:   Controlled diabetes mellitus ([place]) A1c increased to 7.7, however remains at goal. Recommended that patient increase his Metformin to 1000 mg daily.  Patient is happy with this plan.  Went through extensive diet and lifestyle counseling with patient.  Would not like input from dietitian at the moment, is happy to manage this by himself.  Follow-up with me in 3 months time.   Essential hypertension BP at goal.  Continue  Amlodipine 5 mg and Enalapril 20 mg.     Lattie Haw, MD [place]

## 2019-09-11 NOTE — Patient Instructions (Signed)
  Mr Thoresen,  It was great to see you today! I am glad you are keeping well. Your A1c is 7.7 today which has gone up slightly. I have doubled your Metformin dose to 1000mg  once daily. We also talked about weight loss, you are going to increase your exercise and work on a healthier diet as we discussed. I have provided you with the diabetic diet as listed below.  Please do not hesistate to contact me if you have further questions. I will see you in the next 2-3 months for a follow up for your weight.  Best wishes,  Dr Posey Pronto     Diet Recommendations for Diabetes   Starchy (carb) foods: Bread, rice, pasta, potatoes, corn, cereal, grits, crackers, bagels, muffins, all baked goods.  (Fruits, milk, and yogurt also have carbohydrate, but most of these foods will not spike your blood sugar as most starchy foods will.)  A few fruits do cause high blood sugars; use small portions of bananas (limit to 1/2 at a time), grapes, watermelon, oranges, and most tropical fruits.    Protein foods: Meat, fish, poultry, eggs, dairy foods, and beans such as pinto and kidney beans (beans also provide carbohydrate).   1. Eat at least 3 meals and 1-2 snacks per day. Never go more than 4-5 hours while awake without eating. Eat breakfast within the first hour of getting up.   2. Limit starchy foods to TWO per meal and ONE per snack. ONE portion of a starchy  food is equal to the following:   - ONE slice of bread (or its equivalent, such as half of a hamburger bun).   - 1/2 cup of a "scoopable" starchy food such as potatoes or rice.   - 15 grams of Total Carbohydrate as shown on food label.  3. Include at every meal: a protein food, a carb food, and vegetables and/or fruit.   - Obtain twice the volume of veg's as protein or carbohydrate foods for both lunch and dinner.   - Fresh or frozen veg's are best.   - Keep frozen veg's on hand for a quick vegetable serving.

## 2019-09-11 NOTE — Progress Notes (Deleted)
   Subjective:    Patient ID: Andrew [name], male    DOB: October 08, 1955, 64 y.o.   MRN: [id number]   CC:  HPI:   Smoking status reviewed   ROS: pertinent noted in the HPI    Past medical history, surgical, family, and social history reviewed and updated in the EMR as appropriate. Reviewed problem list.   Objective:  There were no vitals taken for this visit.  Vitals and nursing note reviewed  General: NAD, pleasant, able to participate in exam Cardiac: RRR, S1 S2 present. normal heart sounds, no murmurs. Respiratory: CTAB, normal effort, No wheezes, rales or rhonchi Extremities: no edema or cyanosis. Skin: warm and dry, no rashes noted Neuro: alert, no obvious focal deficits Psych: Normal affect and mood   Assessment & Plan:    No problem-specific Assessment & Plan notes found for this encounter.   Lattie Haw, MD  [place] PGY-1

## 2019-09-13 NOTE — Assessment & Plan Note (Signed)
A1c increased to 7.7, however remains at goal. Recommended that patient increase his Metformin to 1000 mg daily.  Patient is happy with this plan.  Went through extensive diet and lifestyle counseling with patient.  Would not like input from dietitian at the moment, is happy to manage this by himself.  Follow-up with me in 3 months time.

## 2019-09-13 NOTE — Assessment & Plan Note (Signed)
BP at goal.  Continue Amlodipine 5 mg and Enalapril 20 mg.

## 2019-09-18 ENCOUNTER — Other Ambulatory Visit: Payer: Self-pay

## 2019-09-18 ENCOUNTER — Encounter: Payer: Self-pay | Admitting: Physical Therapy

## 2019-09-18 ENCOUNTER — Ambulatory Visit (INDEPENDENT_AMBULATORY_CARE_PROVIDER_SITE_OTHER): Payer: Medicare HMO | Admitting: Physical Therapy

## 2019-09-18 DIAGNOSIS — G8929 Other chronic pain: Secondary | ICD-10-CM

## 2019-09-18 DIAGNOSIS — M6281 Muscle weakness (generalized): Secondary | ICD-10-CM | POA: Diagnosis not present

## 2019-09-18 DIAGNOSIS — M545 Low back pain, unspecified: Secondary | ICD-10-CM

## 2019-09-18 NOTE — Therapy (Signed)
[facility] Physical Therapy [address] [place], [place], [address] Phone: [phone]   Fax:  [phone]  Physical Therapy Evaluation  Patient Details  Name: Andrew [name] MRN: [id number] Date of Birth: May 11, 1956 Referring Provider (PT): McDiarmid, Blane Ohara, MD   Encounter Date: 09/18/2019    Physical Therapy Telehealth Visit:  I connected with Andrew [name] today by secure, live face-to-face video conference and verified that I am speaking with the correct person using two identifiers. I discussed the limitations, risks, security and privacy concerns of performing a video visit. I also discussed with the patient or legal guardian that there may be a patient responsible charge related to this service. The patient or legal guardian expressed understanding and verbal consent was obtained by Andrew [name] (patient or legal guardian full name).  The patient's address was confirmed.  Identified to the patient that therapist is a licensed PT in the state of [place].  Verified phone # as [phone] to call in case of technical difficulties.        PT End of Session - 09/18/19 0921    Visit Number  1    Number of Visits  6    Date for PT Re-Evaluation  10/30/19    PT Start Time  0805    PT Stop Time  0835    PT Time Calculation (min)  30 min    Activity Tolerance  Patient tolerated treatment well    Behavior During Therapy  [facility] for tasks assessed/performed       Past Medical History:  Diagnosis Date  . Arthritis   . Chronic left shoulder pain 09/20/2017  . CRD (chronic renal disease), stage II    Baseline Cr 1.2  . CVA (cerebral vascular accident) ([place])    x4 last one in 2007, R sided residual weakness  . HAMMER TOE 07/25/2010  . HLD (hyperlipidemia)   . HTN (hypertension)   . Hypothyroidism   . Leg swelling   . Lipoma of back 03/18/2011  . Personal history of colonic adenomas 04/01/2013  . Prostatitis    hx of x2  . PSEUDOFOLLICULITIS  BARBAE [id number]   Qualifier: Diagnosis of  By: Danise Mina  MD, Garlon Hatchet    . Sarcoidosis   . 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 T2DM (type 2 diabetes mellitus) ([place])     Past Surgical History:  Procedure Laterality Date  . COLONOSCOPY  03/24/2013  . COLONOSCOPY W/ POLYPECTOMY    . ERCP  2006   Normal  . ESOPHAGOGASTRODUODENOSCOPY    . EYE SURGERY Bilateral    cataracts  . [place]   x2 'inguinal  . LIPOMA EXCISION  06/22/2011   Procedure: EXCISION LIPOMA;  Surgeon: Rolm Bookbinder, MD;  Location: [place];  Service: General;  Laterality: N/A;  . TOOTH EXTRACTION    . TOTAL KNEE ARTHROPLASTY Left 05/15/2017   Procedure: LEFT TOTAL KNEE ARTHROPLASTY;  Surgeon: Leandrew Koyanagi, MD;  Location: [place];  Service: Orthopedics;  Laterality: Left;  . TOTAL KNEE ARTHROPLASTY Right 06/09/2018   Procedure: RIGHT TOTAL KNEE ARTHROPLASTY;  Surgeon: Leandrew Koyanagi, MD;  Location: [place];  Service: Orthopedics;  Laterality: Right;  . TRANSTHORACIC ECHOCARDIOGRAM  2005   EF 60%    There were no vitals filed for this visit.   Subjective Assessment - 09/18/19 0838    Subjective  Pt is a 64 y/o male who presents to OPPT for chronic LBP, with worsening symptoms  x 4-5 months.  He attributes decreased activity during COVID pandemic as well as colder temperatures as the cause for exacerbation of symptoms.  Pt reports pain has improved with increase in temperatures recently.    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  Yes    Pain Score  7     Pain Location  Back    Pain Orientation  Left;Right    Pain Descriptors / Indicators  Aching    Pain Type  Chronic pain    Pain Onset  More than a month ago    Pain Frequency  Intermittent    Aggravating Factors   cold weather, rain    Pain Relieving Factors  medication, warmer weather, increased activity          [facility] PT Assessment - 09/18/19 0816      Assessment   Medical Diagnosis  LBP    Referring Provider (PT)  McDiarmid, Blane Ohara, MD    Onset Date/Surgical Date  --   chronic x years, worse x 3-4 months   Next MD Visit  PRN    Prior Therapy  many years ago      Precautions   Precautions  None      Restrictions   Weight Bearing Restrictions  No      Balance Screen   Has the patient fallen in the past 6 months  No    Has the patient had a decrease in activity level because of a fear of falling?   No    Is the patient reluctant to leave their home because of a fear of falling?   No      Home Environment   Living Environment  Private residence    Living Arrangements  Children    Type of [place] to enter    Entrance Stairs-Number of Steps  3    [place]  One level      Prior Function   Level of [place]  Retired    Leisure  walking, Pension scheme manager   Overall Cognitive Status  Within Functional Limits for tasks assessed      ROM / Strength   AROM / PROM / Strength  AROM      AROM   Overall AROM Comments  difficult to fully assess due to screen set up/virtual visit    AROM Assessment Site  Lumbar    Lumbar Flexion  improved symptoms    Lumbar Extension  increased pain    Lumbar - Left Side Bend  increased pain on Lt      Transfers   Five time sit to stand comments   22.69 sec without UE use                Objective measurements completed on examination: See above findings.      [facility] Adult PT Treatment/Exercise - 09/18/19 0816      Self-Care   Self-Care  Other Self-Care Comments    Other Self-Care Comments   demonstrated HEP to pt - he verbalized understanding; discussed gradual increase in activity as he knows this will help with pain             PT Education - 09/18/19 [id number]  Education Details  HEP, TENS    Person(s) Educated  Patient    Methods  Explanation;Handout     Comprehension  Verbalized understanding          PT Long Term Goals - 09/18/19 0924      PT LONG TERM GOAL #1   Title  independent with HEP    Status  New    Target Date  10/30/19      PT LONG TERM GOAL #2   Title  report pain < 4/10 with activity for improved function    Status  New    Target Date  10/30/19      PT LONG TERM GOAL #3   Title  perform 5x STS in < 18 sec for improved functional strength    Status  New    Target Date  10/30/19             Plan - 09/18/19 0921    Clinical Impression Statement  Pt is a 64 y/o male who presents to OPPT for chronic LBP, and seen via virtual visit.  Pt demonstrates pain with ROM and decreased functional strength as noted with 5x STS test.  HEP provided to address flexibility and strength, and recommended increased activity as weather is warming up as this helps his pain.  Pt to try HEP for a couple week and will report back if additional visits are necessary.  Will see up to 1x/wk PRN for 6 wks.    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, HTN, Rt TKA, DM    Examination-Activity Limitations  Locomotion Level;Stand    Examination-Participation Restrictions  Yard Work    Stability/Clinical Decision Making  Evolving/Moderate complexity    Clinical Decision Making  Moderate    Rehab Potential  Good    PT Frequency  1x / week    PT Duration  6 weeks    PT Treatment/Interventions  ADLs/Self Care Home Management;Cryotherapy;Electrical Stimulation;Moist Heat;Gait training;Stair training;Functional mobility training;Therapeutic activities;Therapeutic exercise;Patient/family education;Manual techniques;Taping;Dry needling;Passive range of motion    PT Next Visit Plan  review HEP, progress exercises as indicated    PT Home Exercise Plan  Access Code: [email or web address]    Consulted and Agree with Plan of Care  Patient       Patient will benefit from skilled therapeutic  intervention in order to improve the following deficits and impairments:  Impaired flexibility, Decreased range of motion, Decreased strength, Pain  Visit Diagnosis: Chronic bilateral low back pain without sciatica - Plan: PT plan of care cert/re-cert  Muscle weakness (generalized) - Plan: PT plan of care cert/re-cert     Problem List Patient Active Problem List   Diagnosis Date Noted  . Back pain 07/13/2019  . Fracture of metatarsal bone of left foot 04/01/2019  . Plantar wart of left foot 04/01/2019  . Muscle spasm of back 04/01/2019  . Glaucoma 04/01/2019  . BPH (benign prostatic hyperplasia) 04/01/2019  . Arthritis 12/03/2018  . Primary osteoarthritis of right knee 05/21/2018  . Intrinsic eczema 10/30/2017  . Chronic gout of multiple sites 05/02/2017  . Foot pain, left 10/22/2016  . Healthcare maintenance 04/04/2016  . Osteoarthritis of both knees 12/26/2015  . History of stroke 09/02/2014  . Hypothyroidism 05/25/2013  . Personal history of colonic adenomas 04/01/2013  . Hyperlipidemia associated with type 2 diabetes mellitus ([place]) 02/20/2007  . Controlled diabetes mellitus ([place]) 10/18/2006  . ERECTILE DYSFUNCTION 10/18/2006  .  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 Essential hypertension 10/18/2006  . CKD (chronic kidney disease) stage 3, GFR 30-59 ml/min 09/05/2006     Laureen Abrahams, PT, DPT 09/18/19 9:38 AM     [facility] Physical Therapy [address] [place], [place], [address] Phone: [phone]   Fax:  [phone]  Name: Andrew [name] MRN: [id number] Date of Birth: [birth year]

## 2019-09-18 NOTE — Patient Instructions (Signed)
Access Code: [email or web address]  URL: [email or web address]  Date: 09/18/2019  Prepared by: Faustino Congress   Exercises Seated Hamstring Stretch - 3 reps - 1 sets - 30 sec hold - 2x daily - 7x weekly Seated Figure 4 Piriformis Stretch - 3 reps - 1 sets - 30 sec hold - 2x daily - 7x weekly Hooklying Single Knee to Chest - 3 reps - 1 sets - 30 sec hold - 2x daily - 7x weekly Supine Posterior Pelvic Tilt - 10 reps - 1 sets - 5 sec hold - 2x daily - 7x weekly Supine Bridge - 10 reps - 1 sets - 5 sec hold - 2x daily - 7x weekly Patient Education TENS Unit

## 2019-10-13 DIAGNOSIS — H401131 Primary open-angle glaucoma, bilateral, mild stage: Secondary | ICD-10-CM | POA: Diagnosis not present

## 2019-10-20 ENCOUNTER — Ambulatory Visit: Payer: Medicare HMO | Admitting: Orthopaedic Surgery

## 2019-10-21 ENCOUNTER — Telehealth: Payer: Self-pay

## 2019-10-21 NOTE — Telephone Encounter (Signed)
Patient calls nurse line regarding rx refill for meloxicam 7.5mg  tablets. Patient reports that he has been having back pain related to arthritis and would like refill [facility] into [facility] [address]. Medication is not on current medication list.   To PCP  Please advise  Talbot Grumbling, RN

## 2019-10-26 ENCOUNTER — Other Ambulatory Visit: Payer: Self-pay | Admitting: Family Medicine

## 2019-10-26 MED ORDER — MELOXICAM 7.5 MG PO TABS: 7.5000 mg | ORAL_TABLET | Freq: Every day | ORAL | 0 refills | Status: AC

## 2019-10-26 NOTE — Telephone Encounter (Signed)
Contacted pharmacy to confirm that they had received it, they said they were working on it.Andrew [name], CMA

## 2019-10-26 NOTE — Telephone Encounter (Signed)
I can confirm that I have sent in a 14 day supply of meloxicam to [facility].

## 2019-11-02 ENCOUNTER — Other Ambulatory Visit: Payer: Self-pay | Admitting: Family Medicine

## 2019-11-05 ENCOUNTER — Telehealth: Payer: Self-pay

## 2019-11-05 NOTE — Telephone Encounter (Signed)
Patient calls nurse line to request earlier refill on metformin. Patient reports that he increased his dosage of metformin to 1500 mg daily around 09/28/19. Patient reported that before increase blood sugar levels were running between 200-250.   Patient now states that blood sugar levels have normalized and are staying between 120-150s. Advised patient that in future, if he is having issues with diabetes management to call the office for further instruction.   Please advise if patient should continue at this dosage and if we can send in refill early. Patient reports at increased dosage he will be out of metformin on 11/19/19. Patient has OV follow up on 5/19.  To PCP  Please advise  Talbot Grumbling, RN

## 2019-11-09 ENCOUNTER — Telehealth: Payer: Self-pay | Admitting: Family Medicine

## 2019-11-09 ENCOUNTER — Other Ambulatory Visit: Payer: Self-pay | Admitting: Family Medicine

## 2019-11-09 MED ORDER — METFORMIN HCL ER 500 MG PO TB24: 1000.0000 mg | ORAL_TABLET | Freq: Every day | ORAL | 3 refills | Status: DC

## 2019-11-09 NOTE — Telephone Encounter (Signed)
Please let patient know I have refilled the metformin at the dose I prescribed him, he should continue this and not change the dose without speaking with a physician. I am on nights but will call him later today. Thank you

## 2019-11-09 NOTE — Telephone Encounter (Signed)
Called patient to check how he is taking his Metformin. His CBGs had been in the 200s so he started taking Metformin 1500mg  in the mornings and his CBGs are now 140-180s. I recommended he can take 1000mg  BID until his appointment with me. Patient is happy with this plan.  Lattie Haw  PGY-1, [place]

## 2019-11-09 NOTE — Telephone Encounter (Signed)
CBGs   taking 1538m metformin 182, 184, 124, 147, 151  met

## 2019-11-25 ENCOUNTER — Ambulatory Visit (INDEPENDENT_AMBULATORY_CARE_PROVIDER_SITE_OTHER): Payer: Medicare HMO | Admitting: Family Medicine

## 2019-11-25 ENCOUNTER — Other Ambulatory Visit: Payer: Self-pay

## 2019-11-25 ENCOUNTER — Encounter: Payer: Self-pay | Admitting: Family Medicine

## 2019-11-25 VITALS — BP 130/70 | HR 67 | Ht 73.0 in | Wt 211.0 lb

## 2019-11-25 DIAGNOSIS — E1122 Type 2 diabetes mellitus with diabetic chronic kidney disease: Secondary | ICD-10-CM

## 2019-11-25 DIAGNOSIS — M1A09X0 Idiopathic chronic gout, multiple sites, without tophus (tophi): Secondary | ICD-10-CM | POA: Diagnosis not present

## 2019-11-25 DIAGNOSIS — N183 Chronic kidney disease, stage 3 unspecified: Secondary | ICD-10-CM

## 2019-11-25 LAB — POCT GLYCOSYLATED HEMOGLOBIN (HGB A1C): HbA1c, POC (controlled diabetic range): 7.7 % — AB (ref 0.0–7.0)

## 2019-11-25 MED ORDER — METFORMIN HCL ER 500 MG PO TB24: 1000.0000 mg | ORAL_TABLET | Freq: Two times a day (BID) | ORAL | 3 refills | Status: DC

## 2019-11-25 NOTE — Patient Instructions (Addendum)
Andrew [name],  It was lovely to see you today! Today we spoke about your diabetes, your A1c is 7.7 which is the same as previously. Continue Metformin at the same dose 1000mg  twice daily. Please continue to exercise regularly and make some healthier changes in your diet. I will see you again in 3 months for your A1c. Avoid the foods that trigger your gout but next time you get a gout flare you can take a short course of non steroidal antiflammatory tablets like ibuprofen.  Best wishes,  Dr Posey Pronto

## 2019-11-25 NOTE — Progress Notes (Signed)
    SUBJECTIVE:   CHIEF COMPLAINT / HPI:   Andrew [name] is a 64 yr old male who presents today for diabetes follow up  Diabetes  Last A1c 7.7 in 11/09/19. A1c stable at 7.7 today. Currently takes Metformin 1000mg  BID. Preprandial CBGs 120-140. Post prandial 140-180. Tolerating Metformin well without side effects. Pt drinks a lot of water and consequently has polyuria. Denies polydipsia. Denies blurred vision. Has gained weight due to poor diet and recent sedentary lifestyle. He used to work out a lot in [employer] but in the winter months he could not work out as much due to back pain and colder weather outside. Would like to be more active again.  Gout  Recent had a gout flare in his left big toe after eating pasta with a tomato sauce. Tomato sauce usually triggers his gout and gets pain in the big toes and thumb. The gout flare self resolved within a few days. Takes Allopurinol 200mg  daily.   PERTINENT  PMH / PSH: Diabetes, gout, back pain, HTN   OBJECTIVE:   BP 130/70   Pulse 67   Ht 6\' 1"  (1.854 m)   Wt 211 lb (95.7 kg)   SpO2 98%   BMI 27.84 kg/m   General: Alert, well appearing 64 yr old male, no acute distress, pleasant  Cardio: Normal S1 and S2, RRR Pulm: CTAB, normal WOB  Abdomen: Bowel sounds normal. Abdomen soft and non-tender.  Extremities: No peripheral edema. Warm/ well perfused.  Strong radial pulse  Neuro: Cranial nerves grossly intact  ASSESSMENT/PLAN:   Controlled diabetes mellitus ([facility]) A1c 7.7. Stable from previous. Recommend to continue Metformin 1000mg . Diet and exercise counseling provided. Pt will take up more exercise before next A1c check in 3 months. If A1c persistently elevated can discuss starting SGLT2 inhibitor, GLP1 agonist again.  Chronic gout of multiple sites Gout flare resolved naturally. Recommended to avoid known triggers and weight loss. If flare occurs again recommended pt called [facility] and can treat with short of NSAIDs/colchichine/steroids and  can check uric acid levels. Continue Allopurinol 200mg . Can consider increasing Allopurinol if needed.      Lattie Haw, MD [place]

## 2019-11-27 ENCOUNTER — Ambulatory Visit: Payer: Medicare HMO | Admitting: Orthopaedic Surgery

## 2019-11-30 ENCOUNTER — Telehealth: Payer: Self-pay

## 2019-11-30 NOTE — Assessment & Plan Note (Signed)
Gout flare resolved naturally. Recommended to avoid known triggers and weight loss. If flare occurs again recommended pt called [facility] and can treat with short of NSAIDs/colchichine/steroids and can check uric acid levels. Continue Allopurinol 200mg . Can consider increasing Allopurinol if needed.

## 2019-11-30 NOTE — Telephone Encounter (Signed)
Patient calls nurse line stating his CBGs have been "all over the place." Patient reports he is taking Metformin 1000mg  2x daily. Patient reports his CBGs have been as low as 90 and as high 271. Patient does not report any other symptoms. I advised patient to continue at current dose and I will reach out to PCP for further instruction. As of now he is due back in 3 months for DM FU. Patient advised to keep a journal of [place].  Secondly, patient would like a jury duty excuse letter for his summons in July. Patient states he has too many aliments to serve. Please advise.

## 2019-11-30 NOTE — Assessment & Plan Note (Signed)
A1c 7.7. Stable from previous. Recommend to continue Metformin 1000mg . Diet and exercise counseling provided. Pt will take up more exercise before next A1c check in 3 months. If A1c persistently elevated can discuss starting SGLT2 inhibitor, GLP1 agonist again.

## 2019-12-01 ENCOUNTER — Telehealth: Payer: Self-pay | Admitting: Family Medicine

## 2019-12-01 NOTE — Telephone Encounter (Signed)
Telephone call to patient regarding lowo blood sugars. He has been taking the Metformin 1000mg  BID. He went for a walk without eating breakfast and started to feel tired. His CBGs was 90. Denies feeling shaking or jittery. The next day he went for a walk having eaten breakfast and took his metformin. He denied hypoglycemic symptoms. Encouraged patient to eat 3 balanced meals a day and to eat a snack prior to exercise to avoid hypoglycemia. Also explained that Metformin does not cause hypoglycemia and so if his sugars are low it is due to inadequate oral intake. Pt would also like a letter to be excused from [place] duty on July 27th because of his kidney disease and diabetes and cannot be "bothered" to go.    Lattie Haw MD  PGY-1, [facility]

## 2019-12-06 ENCOUNTER — Telehealth: Payer: Self-pay | Admitting: Family Medicine

## 2019-12-06 DIAGNOSIS — G8929 Other chronic pain: Secondary | ICD-10-CM

## 2019-12-06 MED ORDER — MELOXICAM 7.5 MG PO TABS: 7.5000 mg | ORAL_TABLET | Freq: Every day | ORAL | 0 refills | Status: AC

## 2019-12-06 NOTE — Telephone Encounter (Signed)
Patient states that his back pain worsened, he thinks due to the change in weather.  He went to take his meloxicam, but realized he was out.  He is currently not taking anything else for pain.  Looks like it was last prescribed in April.  I gave the patient 1 weeks worth of meloxicam 7.5 and advised him to call our clinic on Tuesday if his pain does not improve.    Will forward to pcp.    Clemetine Marker, MD.

## 2019-12-08 ENCOUNTER — Other Ambulatory Visit: Payer: Self-pay

## 2019-12-08 ENCOUNTER — Ambulatory Visit (INDEPENDENT_AMBULATORY_CARE_PROVIDER_SITE_OTHER): Payer: Medicare HMO | Admitting: Family Medicine

## 2019-12-08 VITALS — BP 124/76 | HR 82 | Wt 210.4 lb

## 2019-12-08 DIAGNOSIS — M545 Low back pain, unspecified: Secondary | ICD-10-CM

## 2019-12-08 MED ORDER — CYCLOBENZAPRINE HCL 10 MG PO TABS: 10.0000 mg | ORAL_TABLET | Freq: Three times a day (TID) | ORAL | 0 refills | Status: AC | PRN

## 2019-12-08 MED ORDER — NAPROXEN 500 MG PO TABS: 500.0000 mg | ORAL_TABLET | Freq: Two times a day (BID) | ORAL | 0 refills | Status: DC

## 2019-12-08 NOTE — Patient Instructions (Addendum)
Today we talked for your back pain, you can continue to take the Tylenol you have at home at the dose on the bottle.  Were also going to give you a few weeks of naproxen.  While you are taking this naproxen do not take ibuprofen or Advil.  We are going to give you a week worth of Flexeril which is a muscle relaxer, some people get drowsiness so do not take this before driving until you see how it makes you feel.  If you get any new or concerning symptoms like incontinence/numbness or radiation down your legs of the pain please speak to physician immediately.  Acute Back Pain, Adult Acute back pain is sudden and usually short-lived. It is often caused by an injury to the muscles and tissues in the back. The injury may result from:  A muscle or ligament getting overstretched or torn (strained). Ligaments are tissues that connect bones to each other. Lifting something improperly can cause a back strain.  Wear and tear (degeneration) of the spinal disks. Spinal disks are circular tissue that provides cushioning between the bones of the spine (vertebrae).  Twisting motions, such as while playing sports or doing yard work.  A hit to the back.  Arthritis. You may have a physical exam, lab tests, and imaging tests to find the cause of your pain. Acute back pain usually goes away with rest and home care. Follow these instructions at home: Managing pain, stiffness, and swelling  Take over-the-counter and prescription medicines only as told by your health care provider.  Your health care provider may recommend applying ice during the first 24-48 hours after your pain starts. To do this: ? Put ice in a plastic bag. ? Place a towel between your skin and the bag. ? Leave the ice on for 20 minutes, 2-3 times a day.  If directed, apply heat to the affected area as often as told by your health care provider. Use the heat source that your health care provider recommends, such as a moist heat pack or a heating  pad. ? Place a towel between your skin and the heat source. ? Leave the heat on for 20-30 minutes. ? Remove the heat if your skin turns bright red. This is especially important if you are unable to feel pain, heat, or cold. You have a greater risk of getting burned. Activity   Do not stay in bed. Staying in bed for more than 1-2 days can delay your recovery.  Sit up and stand up straight. Avoid leaning forward when you sit, or hunching over when you stand. ? If you work at a desk, sit close to it so you do not need to lean over. Keep your chin tucked in. Keep your neck drawn back, and keep your elbows bent at a right angle. Your arms should look like the letter "L." ? Sit high and close to the steering wheel when you drive. Add lower back (lumbar) support to your car seat, if needed.  Take short walks on even surfaces as soon as you are able. Try to increase the length of time you walk each day.  Do not sit, drive, or stand in one place for more than 30 minutes at a time. Sitting or standing for long periods of time can put stress on your back.  Do not drive or use heavy machinery while taking prescription pain medicine.  Use proper lifting techniques. When you bend and lift, use positions that put less stress on  your back: ? Bend your knees. ? Keep the load close to your body. ? Avoid twisting.  Exercise regularly as told by your health care provider. Exercising helps your back heal faster and helps prevent back injuries by keeping muscles strong and flexible.  Work with a physical therapist to make a safe exercise program, as recommended by your health care provider. Do any exercises as told by your physical therapist. Lifestyle  Maintain a healthy weight. Extra weight puts stress on your back and makes it difficult to have good posture.  Avoid activities or situations that make you feel anxious or stressed. Stress and anxiety increase muscle tension and can make back pain worse.  Learn ways to manage anxiety and stress, such as through exercise. General instructions  Sleep on a firm mattress in a comfortable position. Try lying on your side with your knees slightly bent. If you lie on your back, put a pillow under your knees.  Follow your treatment plan as told by your health care provider. This may include: ? Cognitive or behavioral therapy. ? Acupuncture or massage therapy. ? Meditation or yoga. Contact a health care provider if:  You have pain that is not relieved with rest or medicine.  You have increasing pain going down into your legs or buttocks.  Your pain does not improve after 2 weeks.  You have pain at night.  You lose weight without trying.  You have a fever or chills. Get help right away if:  You develop new bowel or bladder control problems.  You have unusual weakness or numbness in your arms or legs.  You develop nausea or vomiting.  You develop abdominal pain.  You feel faint. Summary  Acute back pain is sudden and usually short-lived.  Use proper lifting techniques. When you bend and lift, use positions that put less stress on your back.  Take over-the-counter and prescription medicines and apply heat or ice as directed by your health care provider. This information is not intended to replace advice given to you by your health care provider. Make sure you discuss any questions you have with your health care provider. Document Revised: 10/14/2018 Document Reviewed: 02/06/2017 Elsevier Patient Education  [place].

## 2019-12-08 NOTE — Progress Notes (Signed)
    SUBJECTIVE:   CHIEF COMPLAINT / HPI: Back pain  Back pain Bilateral paraspinal and lumbar pain with no bony midline tenderness.  Got out of his car and "felt a pop "yesterday, said he had no paralysis but does have significant pain and has been difficult to find a comfortable position in which to stand.  He still does have reasonable flexion extension and side bend although limited by tenderness.  He has no incontinence or saddle anesthesia.  PERTINENT  PMH / PSH:   OBJECTIVE:   BP 124/76   Pulse 82   Wt 210 lb 6.4 oz (95.4 kg)   SpO2 100%   BMI 27.76 kg/m   General: Alert pleasant, no distress, uncomfortable MSK exam: No bony midline tenderness, no radiculopathy, there is hypertonicity to paraspinal musculature in the lumbar region bilaterally, there is point tenderness along the lumbosacral musculature on the back, no belly pain, does have good side flexion, does have flexion with pain and does have reasonable back extension.  No visible wounding   ASSESSMENT/PLAN:   Back pain Bilateral paraspinal and lumbar pain with no bony midline tenderness.  Got out of his car and "felt a pop "yesterday, said he had no paralysis but does have significant pain and has been difficult to find a comfortable position in which to stand.  He still does have reasonable flexion extension and side bend although limited by tenderness.  He has no incontinence or saddle anesthesia.  We will offer short dose of Flexeril and naproxen along with his home Tylenol.  We did discuss red flag symptoms and the fact that if this was actually muscle strain that I suspect this could hurt for 2 to 6 weeks.  Does have a history of degenerative disc but there is no radiculopathy     Sherene Sires, [place]

## 2019-12-08 NOTE — Assessment & Plan Note (Addendum)
Bilateral paraspinal and lumbar pain with no bony midline tenderness.  Got out of his car and "felt a pop "yesterday, said he had no paralysis but does have significant pain and has been difficult to find a comfortable position in which to stand.  He still does have reasonable flexion extension and side bend although limited by tenderness.  He has no incontinence or saddle anesthesia.  We will offer short dose of Flexeril and naproxen along with his home Tylenol.  We did discuss red flag symptoms and the fact that if this was actually muscle strain that I suspect this could hurt for 2 to 6 weeks.  Does have a history of degenerative disc but there is no radiculopathy

## 2019-12-09 NOTE — Telephone Encounter (Signed)
Noted and agreed. Thanks Dr Jeannine Kitten.

## 2019-12-16 ENCOUNTER — Telehealth: Payer: Self-pay

## 2019-12-16 NOTE — Telephone Encounter (Signed)
Patient calls nurse line for two concerns. (1) patient reports his cbgs have been dipping "low" into 140s and then running back "high" into 180s. Patient reports when his cbgs drop to 140 he drinks a glass of apple juice, which brings him up to the 180s. Patient denies any symptoms, reports fatigue, but not new for him. Patient states he is compliant on Metformin 1 gram in AM and PM. I advised patient to stick with current Metformin regime and that his current dips in cbgs are not alarming and to not drink apple juice unless he experiences a significant cbg drop. Patient voiced understanding. (2) Patient reports he is constipated, however last BM was yesterday. Patient reports he took a laxative 2 hours and has not had any relief. I advised patient to give it time to work and drink plenty of water. Patient to call back if sxs worsen or do not improve. Will forward to PCP for any advice.

## 2019-12-17 ENCOUNTER — Telehealth: Payer: Self-pay | Admitting: Family Medicine

## 2019-12-17 NOTE — Telephone Encounter (Signed)
Called patient to discuss his concerns. Thank you!

## 2019-12-17 NOTE — Telephone Encounter (Signed)
Called patient this morning to discuss his concerns regarding blood sugars and constipation.  Patient reported that his blood sugars are between 140 and 180.  Denies hypoglycemic symptoms when he had blood sugars within this range.  I recommended that he does not take apple juice to bring his blood sugars up as his CBG goal is 110-120.  I explained to him that hypoglycemia is when he starts to feel shaky or jittery and he has a blood sugar of <80 and that he can drink/eat something sugary then to bring his blood sugars up. Patient took laxative after feeling constipated recently but now this has passed.  I recommended patient drinks plenty of water and has a high-fiber diet to prevent constipation. Patient is happy with the above plan.

## 2019-12-24 ENCOUNTER — Telehealth: Payer: Self-pay

## 2019-12-24 NOTE — Telephone Encounter (Signed)
Patient calls nurse line requesting note excusing him from jury duty. Patient states that his court date is not until July 27th, however, they need 10 days notice if unable to attend. Patient states that he does not believe he would be able to attend long sessions due to multiple health problems.   To PCP  Please advise  Talbot Grumbling, RN

## 2019-12-28 NOTE — Telephone Encounter (Signed)
I cannot find any indication to approve this letter.  Please have patient make an appointment to see MD to discuss.  Thank you Carollee Leitz, MD [facility]

## 2019-12-28 NOTE — Telephone Encounter (Signed)
To the team to call.  Christen Bame, CMA

## 2019-12-29 ENCOUNTER — Telehealth: Payer: Self-pay

## 2019-12-29 NOTE — Telephone Encounter (Signed)
Patient calls nurse line regarding elevated BP. Patient reports AM BP reading of 161/84. Patient is currently asymptomatic. Patient took 5 mg amlodipine this AM at 0500, then after having elevated BP took another 5 mg tablet at around 0920.   Scheduled patient office visit tomorrow morning with Dr. Higinio Plan, as there are no openings for today. Spoke with preceptor, who was agreeable to plan.   ED precautions given. Advised patient to not take additional BP medication. Patient verbalized understanding and will report to clinic tomorrow for evaluation.   FYI to PCP  Talbot Grumbling, RN

## 2019-12-29 NOTE — Telephone Encounter (Signed)
Pt informed of below and he has an appointment tomorrow with Dr. Higinio Plan and I told him to mention it to her to see if this is something that can be done or not.Brownie Nehme Zimmerman Rumple, CMA

## 2019-12-30 ENCOUNTER — Encounter: Payer: Self-pay | Admitting: Family Medicine

## 2019-12-30 ENCOUNTER — Ambulatory Visit (INDEPENDENT_AMBULATORY_CARE_PROVIDER_SITE_OTHER): Payer: Medicare HMO | Admitting: Family Medicine

## 2019-12-30 ENCOUNTER — Other Ambulatory Visit: Payer: Self-pay

## 2019-12-30 VITALS — BP 148/65 | HR 70 | Wt 213.0 lb

## 2019-12-30 DIAGNOSIS — Z862 Personal history of diseases of the blood and blood-forming organs and certain disorders involving the immune mechanism: Secondary | ICD-10-CM | POA: Diagnosis not present

## 2019-12-30 DIAGNOSIS — N1831 Chronic kidney disease, stage 3a: Secondary | ICD-10-CM

## 2019-12-30 DIAGNOSIS — I10 Essential (primary) hypertension: Secondary | ICD-10-CM | POA: Diagnosis not present

## 2019-12-30 MED ORDER — AMLODIPINE BESYLATE 10 MG PO TABS: 10.0000 mg | ORAL_TABLET | Freq: Every day | ORAL | 1 refills | Status: DC

## 2019-12-30 NOTE — Assessment & Plan Note (Signed)
Normocytic anemia of 12.5 in 06/2018.  Will obtain updated CBC to ensure anemia has resolved especially with his occasional feeling of jitteriness.  Unremarkable colonoscopy in 2019, due again in 2029.  Denies any evidence of bleeding.

## 2019-12-30 NOTE — Progress Notes (Signed)
    SUBJECTIVE:   CHIEF COMPLAINT / HPI: Elevated blood pressure  Hypertension: Patient here for follow-up of uncontrolled arterial hypertension, reports BP ranging from 309-407 at home systolically.  However, recently has been around systolic 680.  He has felt occasional jitteriness/lightheadedness, wonders if it is his blood pressure or labile glucose. Usually drinks some juice and feels better, when he checks his sugars during this time lowest have been around 113.  Not related to position changes, no associated weakness/numbness.  Drinking plenty of water.  He is not exercising currently due to flareup of back pain and is adherent to a low-salt diet. Takes Norvasc 5 mg and enalapril 40 mg for blood pressure management currently. Cardiac symptoms: none. Patient denies: chest pain, dyspnea, exertional chest pressure/discomfort, lower extremity edema, palpitations and syncope. Cardiovascular risk factors: advanced age (older than 68 for men, 48 for women), diabetes mellitus, hypertension and male gender. Use of agents associated with hypertension: thyroid hormones. History of target organ damage: chronic kidney disease and stroke.  He also requests referral back to his previous nephrologist, Dr. Florene Glen, for continued monitoring of his CKD.  Last renal function checked in 11/2018 with a GFR of 71 at that time, consistently has been over 60 through 2019.  His brother is on dialysis for ESRD and he worries for himself.  Would also like letter excuse from jury duty due to his multiple medical problems.  PERTINENT  PMH / PSH: Hypertension, diabetes, hyperlipidemia, hypothyroidism, osteoarthritis  OBJECTIVE:   BP (!) 148/65   Pulse 70   Wt 213 lb (96.6 kg)   SpO2 99%   BMI 28.10 kg/m   General: Alert, NAD HEENT: NCAT, MMM Cardiac: RRR no m/g/r Lungs: Clear bilaterally, no increased WOB  Abdomen: soft Msk: Moves all extremities spontaneously  Ext: Warm, dry, 2+ distal pulses, no edema  b/l  ASSESSMENT/PLAN:   Essential hypertension Uncontrolled, goal <130/80, especially given reported history of previous CVA.  Will increase Norvasc to 10 mg daily, continue enalapril 40 mg as is.  Monitor BP at home every other day, bring in journal on follow-up.  CKD (chronic kidney disease) stage 3, GFR 30-59 ml/min Last several renal functions have been within CKD stage II, will recheck BMP today.  If significantly worsened, will refer back to his previous nephrologist, Dr. Florene Glen.  Otherwise will periodically monitor and provide hypertensive/diabetic control to hopefully prevent progression.  History of anemia Normocytic anemia of 12.5 in 06/2018.  Will obtain updated CBC to ensure anemia has resolved especially with his occasional feeling of jitteriness.  Unremarkable colonoscopy in 2019, due again in 2029.  Denies any evidence of bleeding.    Declined providing jury duty excuse letter as he currently has no indicating medical condition to do so.  He is mentally competent and physically able to assist.  He endorsed understanding.  Follow-up in 1 month for above and diabetic management.   [name], [place]

## 2019-12-30 NOTE — Assessment & Plan Note (Signed)
Uncontrolled, goal <130/80, especially given reported history of previous CVA.  Will increase Norvasc to 10 mg daily, continue enalapril 40 mg as is.  Monitor BP at home every other day, bring in journal on follow-up.

## 2019-12-30 NOTE — Patient Instructions (Addendum)
It was wonderful to meet you today!   We are going to check your kidneys and blood level today.  I have increased your Norvasc from 5 mg to 10 mg daily.  Please check your blood pressure every other day in the morning after sitting for several minutes with your legs uncrossed.  Please keep a journal of this.  Your goal is to have your blood pressure around 130/80.  Please follow-up in 1 month to check in, or sooner if needed if you continue to feel off despite the changes we have made.

## 2019-12-30 NOTE — Assessment & Plan Note (Signed)
Last several renal functions have been within CKD stage II, will recheck BMP today.  If significantly worsened, will refer back to his previous nephrologist, Dr. Florene Glen.  Otherwise will periodically monitor and provide hypertensive/diabetic control to hopefully prevent progression.

## 2019-12-31 ENCOUNTER — Other Ambulatory Visit: Payer: Self-pay | Admitting: Family Medicine

## 2019-12-31 DIAGNOSIS — E871 Hypo-osmolality and hyponatremia: Secondary | ICD-10-CM

## 2019-12-31 LAB — BASIC METABOLIC PANEL
BUN/Creatinine Ratio: 14 (ref 10–24)
BUN: 18 mg/dL (ref 8–27)
CO2: 24 mmol/L (ref 20–29)
Calcium: 9.7 mg/dL (ref 8.6–10.2)
Chloride: 93 mmol/L — ABNORMAL LOW (ref 96–106)
Creatinine, Ser: 1.26 mg/dL (ref 0.76–1.27)
GFR calc Af Amer: 70 mL/min/{1.73_m2} (ref >59)
GFR calc non Af Amer: 60 mL/min/{1.73_m2} (ref >59)
Glucose: 198 mg/dL — ABNORMAL HIGH (ref 65–99)
Potassium: 4.8 mmol/L (ref 3.5–5.2)
Sodium: 129 mmol/L — ABNORMAL LOW (ref 134–144)

## 2019-12-31 LAB — CBC
Hematocrit: 43.1 % (ref 37.5–51.0)
Hemoglobin: 13.9 g/dL (ref 13.0–17.7)
MCH: 30.2 pg (ref 26.6–33.0)
MCHC: 32.3 g/dL (ref 31.5–35.7)
MCV: 94 fL (ref 79–97)
Platelets: 208 10*3/uL (ref 150–450)
RBC: 4.6 x10E6/uL (ref 4.14–5.80)
RDW: 12.8 % (ref 11.6–15.4)
WBC: 6.1 10*3/uL (ref 3.4–10.8)

## 2020-01-06 ENCOUNTER — Other Ambulatory Visit: Payer: Medicare HMO

## 2020-01-06 ENCOUNTER — Other Ambulatory Visit: Payer: Self-pay | Admitting: Family Medicine

## 2020-01-06 ENCOUNTER — Other Ambulatory Visit: Payer: Self-pay

## 2020-01-06 DIAGNOSIS — E871 Hypo-osmolality and hyponatremia: Secondary | ICD-10-CM | POA: Diagnosis not present

## 2020-01-07 LAB — BASIC METABOLIC PANEL
BUN/Creatinine Ratio: 11 (ref 10–24)
BUN: 14 mg/dL (ref 8–27)
CO2: 23 mmol/L (ref 20–29)
Calcium: 9.3 mg/dL (ref 8.6–10.2)
Chloride: 95 mmol/L — ABNORMAL LOW (ref 96–106)
Creatinine, Ser: 1.24 mg/dL (ref 0.76–1.27)
GFR calc Af Amer: 71 mL/min/{1.73_m2} (ref >59)
GFR calc non Af Amer: 61 mL/min/{1.73_m2} (ref >59)
Glucose: 210 mg/dL — ABNORMAL HIGH (ref 65–99)
Potassium: 4.5 mmol/L (ref 3.5–5.2)
Sodium: 131 mmol/L — ABNORMAL LOW (ref 134–144)

## 2020-01-11 ENCOUNTER — Other Ambulatory Visit: Payer: Self-pay | Admitting: Family Medicine

## 2020-01-12 ENCOUNTER — Telehealth: Payer: Self-pay

## 2020-01-12 NOTE — Telephone Encounter (Signed)
Patient calls nurse line requesting BMP results from 6/30. Please advise.

## 2020-01-13 NOTE — Telephone Encounter (Signed)
Called patient, thank you.   Andrew Clan, DO

## 2020-01-15 ENCOUNTER — Ambulatory Visit: Payer: Medicare HMO | Admitting: Orthopaedic Surgery

## 2020-01-19 ENCOUNTER — Other Ambulatory Visit: Payer: Self-pay | Admitting: Family Medicine

## 2020-01-19 ENCOUNTER — Telehealth: Payer: Self-pay

## 2020-01-19 DIAGNOSIS — M545 Low back pain, unspecified: Secondary | ICD-10-CM

## 2020-01-19 DIAGNOSIS — I10 Essential (primary) hypertension: Secondary | ICD-10-CM

## 2020-01-19 MED ORDER — AMLODIPINE BESYLATE 10 MG PO TABS: 10.0000 mg | ORAL_TABLET | Freq: Every day | ORAL | 1 refills | Status: DC

## 2020-01-19 MED ORDER — ALLOPURINOL 100 MG PO TABS: 200.0000 mg | ORAL_TABLET | Freq: Every day | ORAL | 3 refills | Status: DC

## 2020-01-19 MED ORDER — METFORMIN HCL ER 500 MG PO TB24: 1000.0000 mg | ORAL_TABLET | Freq: Two times a day (BID) | ORAL | 3 refills | Status: DC

## 2020-01-20 ENCOUNTER — Telehealth: Payer: Self-pay | Admitting: Family Medicine

## 2020-01-20 NOTE — Telephone Encounter (Signed)
Called patient to ask whether he needs a refill for naproxen. He said he still needs it on hand when he gets back pain. Refilled prescription for him.

## 2020-01-21 ENCOUNTER — Other Ambulatory Visit: Payer: Self-pay | Admitting: Family Medicine

## 2020-01-21 DIAGNOSIS — I10 Essential (primary) hypertension: Secondary | ICD-10-CM

## 2020-01-25 MED ORDER — ALLOPURINOL 100 MG PO TABS: 200.0000 mg | ORAL_TABLET | Freq: Every day | ORAL | 3 refills | Status: DC

## 2020-01-25 MED ORDER — METFORMIN HCL ER 500 MG PO TB24: 1000.0000 mg | ORAL_TABLET | Freq: Two times a day (BID) | ORAL | 3 refills | Status: DC

## 2020-01-25 NOTE — Addendum Note (Signed)
Addended by: Talbot Grumbling on: 01/25/2020 11:32 AM   Modules accepted: Orders

## 2020-01-25 NOTE — Telephone Encounter (Signed)
Noted, thank you Hannah!

## 2020-01-25 NOTE — Telephone Encounter (Signed)
Received phone call from patient and [facility] regarding prescription refills. Patient wants to have his medications changed over to [facility]. Called and spoke with pharmacist at [facility], cancelled allopurinol and metformin. Patient has already picked up amlodipine. Resending rx to [place].   FYI to PCP  Talbot Grumbling, RN

## 2020-01-26 ENCOUNTER — Other Ambulatory Visit: Payer: Self-pay | Admitting: *Deleted

## 2020-01-26 DIAGNOSIS — I10 Essential (primary) hypertension: Secondary | ICD-10-CM

## 2020-01-26 MED ORDER — AMLODIPINE BESYLATE 10 MG PO TABS: ORAL_TABLET | ORAL | 1 refills | Status: DC

## 2020-01-27 ENCOUNTER — Other Ambulatory Visit: Payer: Self-pay | Admitting: Family Medicine

## 2020-01-27 ENCOUNTER — Ambulatory Visit: Payer: Medicare HMO | Admitting: Family Medicine

## 2020-01-28 ENCOUNTER — Other Ambulatory Visit: Payer: Self-pay

## 2020-01-28 ENCOUNTER — Ambulatory Visit (HOSPITAL_COMMUNITY)
Admission: EM | Admit: 2020-01-28 | Discharge: 2020-01-28 | Discharge status: Against Medical Advice | Payer: Medicare HMO | Attending: Emergency Medicine | Admitting: Emergency Medicine

## 2020-01-28 NOTE — ED Triage Notes (Signed)
Pt c/o blood sugar dropping during the day after taking metformin, has an appointment on Monday but is concerned he will have an issue over the weekend. Is drinking apple juice after low readings.

## 2020-02-01 ENCOUNTER — Other Ambulatory Visit: Payer: Self-pay

## 2020-02-01 ENCOUNTER — Ambulatory Visit (INDEPENDENT_AMBULATORY_CARE_PROVIDER_SITE_OTHER): Payer: Medicare HMO | Admitting: Family Medicine

## 2020-02-01 VITALS — BP 130/80 | HR 78 | Ht 73.0 in | Wt 220.6 lb

## 2020-02-01 DIAGNOSIS — I10 Essential (primary) hypertension: Secondary | ICD-10-CM | POA: Diagnosis not present

## 2020-02-01 DIAGNOSIS — E119 Type 2 diabetes mellitus without complications: Secondary | ICD-10-CM | POA: Diagnosis not present

## 2020-02-01 NOTE — Progress Notes (Signed)
    SUBJECTIVE:   CHIEF COMPLAINT / HPI: Follow-up  Andrew [name] is a 64 year old gentleman presenting for hypertension and diabetes follow-up.  Hypertension: Last seen in the office on 2/19, systolics averaging 758 at home at that time.  Increased his Norvasc from 5 to 10 mg and continued his enalapril 40 mg as is.  He has been monitoring his BP 1-3 times daily and kept a journal.  Systolics on average 832-549 with diastolics averaging 82M.  Lightheadedness: Reports feeling "off and woozy" very frequently whenever his glucose drops below 130.  He feels like it happens almost every night and sometimes during the day.  He is very bothered by this.  Occurring for the past 2 years, ever since he trialed a GLP-1 agonist.  States he has never had any episode with his sugar being above 130.  He drinks some apple juice or has a snack and feels back to his normal within 30 minutes.  Went to the ED on 7/22 for this concern, left before being seen.  He currently is only on Metformin at 1000 mg twice daily.  Last A1c in 11/2019 was 7.7.  Reviewed glucometer, average glucose around 150-160s with the highest seen of 200 and lowest of 113 over the past 2-3 weeks.  He is also very frustrated that he continually has to prick his finger for glucose checks whenever he is feeling low.   PERTINENT  PMH / PSH: Hypertension, diabetes, hyperlipidemia, hypothyroidism, osteoarthritis  OBJECTIVE:   BP (!) 130/80   Pulse 78   Ht 6\' 1"  (1.854 m)   Wt (!) 220 lb 9.6 oz (100.1 kg)   SpO2 97%   BMI 29.10 kg/m   General: Alert, NAD HEENT: NCAT, MMM Lungs: No increased WOB  Abdomen: soft Msk: Moves all extremities spontaneously  Ext: Warm, dry, 2+ distal pulses, no edema   ASSESSMENT/PLAN:   Essential hypertension Now well controlled after increase of Norvasc in 12/2019.  Maintain regimen as is with enalapril 40 mg and Norvasc 10.  Can monitor BP weekly or as needed if any concern.  Type 2 diabetes mellitus without  complications (HCC) E1R 7.7 in 11/2019, due for next A1c at end of August.  On Metformin 1000mg  BID with apparently relative hypoglycemic symptoms with any glucose <130, unclear if he has lower CBGs that are not detected by the time he checks.  No associated hypotension or significant electrolyte derangement/anemia to suggest alternative cause at this time.  Reports significant frustration and impacting his quality of life, will send to pharmacy clinic with Dr. Valentina Lucks to discuss ?Short-term continuous glucose monitoring to have a better sense of his pattern.  Additionally recommended keeping a journal of timing/frequency/glucose level to bring in.  Encouraged balanced diet and eating on regular basis.    Discussed with Dr. Andria Frames.  Follow-up in approximately 3-4 weeks with pharmacy clinic for above or sooner if needed.   [name], [place]

## 2020-02-01 NOTE — Patient Instructions (Signed)
It was wonderful to see you today.   Please schedule with Dr. Valentina Lucks for the next 3 weeks to discuss glucose monitoring. Keep your medicines as is.

## 2020-02-02 ENCOUNTER — Other Ambulatory Visit: Payer: Medicare HMO

## 2020-02-03 ENCOUNTER — Other Ambulatory Visit: Payer: Medicare HMO

## 2020-02-03 ENCOUNTER — Other Ambulatory Visit: Payer: Self-pay

## 2020-02-04 ENCOUNTER — Encounter: Payer: Self-pay | Admitting: Family Medicine

## 2020-02-04 NOTE — Assessment & Plan Note (Addendum)
A1c 7.7 in 11/2019, due for next A1c at end of August.  On Metformin 1000mg  BID with apparently relative hypoglycemic symptoms with any glucose <130, unclear if he has lower CBGs that are not detected by the time he checks.  No associated hypotension or significant electrolyte derangement/anemia to suggest alternative cause at this time.  Reports significant frustration and impacting his quality of life, will send to pharmacy clinic with Dr. Valentina Lucks to discuss ?Short-term continuous glucose monitoring to have a better sense of his pattern.  Additionally recommended keeping a journal of timing/frequency/glucose level to bring in.  Encouraged balanced diet and eating on regular basis.

## 2020-02-04 NOTE — Assessment & Plan Note (Signed)
Now well controlled after increase of Norvasc in 12/2019.  Maintain regimen as is with enalapril 40 mg and Norvasc 10.  Can monitor BP weekly or as needed if any concern.

## 2020-02-15 ENCOUNTER — Other Ambulatory Visit: Payer: Self-pay | Admitting: Family Medicine

## 2020-02-15 DIAGNOSIS — I10 Essential (primary) hypertension: Secondary | ICD-10-CM

## 2020-02-18 DIAGNOSIS — Z961 Presence of intraocular lens: Secondary | ICD-10-CM | POA: Diagnosis not present

## 2020-02-18 DIAGNOSIS — E119 Type 2 diabetes mellitus without complications: Secondary | ICD-10-CM | POA: Diagnosis not present

## 2020-02-18 DIAGNOSIS — H524 Presbyopia: Secondary | ICD-10-CM | POA: Diagnosis not present

## 2020-02-18 DIAGNOSIS — H401131 Primary open-angle glaucoma, bilateral, mild stage: Secondary | ICD-10-CM | POA: Diagnosis not present

## 2020-02-18 DIAGNOSIS — H52203 Unspecified astigmatism, bilateral: Secondary | ICD-10-CM | POA: Diagnosis not present

## 2020-02-18 DIAGNOSIS — H5213 Myopia, bilateral: Secondary | ICD-10-CM | POA: Diagnosis not present

## 2020-02-18 DIAGNOSIS — Z7984 Long term (current) use of oral hypoglycemic drugs: Secondary | ICD-10-CM | POA: Diagnosis not present

## 2020-02-18 LAB — HM DIABETES EYE EXAM

## 2020-02-22 ENCOUNTER — Ambulatory Visit (INDEPENDENT_AMBULATORY_CARE_PROVIDER_SITE_OTHER): Payer: Medicare HMO | Admitting: Pharmacist

## 2020-02-22 ENCOUNTER — Encounter: Payer: Self-pay | Admitting: Pharmacist

## 2020-02-22 ENCOUNTER — Other Ambulatory Visit: Payer: Self-pay

## 2020-02-22 DIAGNOSIS — E119 Type 2 diabetes mellitus without complications: Secondary | ICD-10-CM | POA: Diagnosis not present

## 2020-02-22 NOTE — Assessment & Plan Note (Signed)
Diabetes currently uncontrolled with most recent A1c 7.7. Patient is not experiencing hallmark signs of hypoglycemia and blood sugars are not truly hypoglycemic when he is experiencing lightheaded symptoms. Question of whether this is truly hypoglycemia versus orthostasis. Patient is able to verbalize appropriate hypoglycemia management plan. Medication adherence appears to be consistent, however he changed his metformin dosing from 1000mg  BID to 500mg  TID.  -Patient instructed to call [employer] to ask if they will cover any CGM device for him. Then call us back to discuss next steps.  -Continue current medication regimen -Extensively discussed pathophysiology of diabetes, recommended lifestyle interventions, dietary effects on blood sugar control -Counseled on s/sx of and management of hypoglycemia -Next A1C anticipated this month.

## 2020-02-22 NOTE — Patient Instructions (Addendum)
Nice to see you today!  Please call [employer] to see if they will cover a "continuous glucose monitor" for you such as Dexcom G6 or Freestyle Libre.  Call us back when you know if they will cover it: [phone].

## 2020-02-22 NOTE — Progress Notes (Signed)
Reviewed: I agree with Dr. Koval's documentation and management. 

## 2020-02-22 NOTE — Progress Notes (Signed)
S:     Chief Complaint  Patient presents with  . Medication Management    Diabetes    Patient arrives in good spirits.  Presents for diabetes evaluation, education, and management. He reports episodes of lightheadedness with blood sugars in the 110s that resolve with drinking apple juice. States he saw on TV that Medicare will cover continuous glucose monitors and wants to try it out to give him a better idea of what his blood sugars are doing.  Patient was referred by Dr. Higinio Plan on 02/01/20.  Patient was last seen by Primary Care Provider on 11/25/19.    Insurance coverage/medication affordability: [employer] Medicare  Medication adherence reported as taking medications daily.   Current diabetes medications include: metformin 500 mg TID (prescribed as 1000mg  BID) Current hypertension medications include: enalapril 40 mg daily, amlodipine 10 mg daily Current hyperlipidemia medications include: rosuvastatin 20 mg daily  Patient reports recent symptomatic hypoglycemic events. Reports sometimes is lightheaded when blood sugars are as low as 112. However on Sunday, blood sugar was 105 but had no symptoms. Corrects lightheadedness with apple juice and feels better. Having lightheaded symptoms usually 9-10am in the morning.   States BP runs good at home (has monitor at home) but can't recall values. Meter tells him if it is high, low, or normal.   Patient reported dietary habits: Eats two meals/day Breakfast: eats around 6am ([place] sausage, one egg) Lunch: eats around 12pm Dinner: rarely eats dinner Snacks: eats a snack around 4pm such as a sandwich or crackers Drinks: water, coffee, cranberry juice, green tea  Patient-reported exercise habits: walks a mile every day unless it is raining or too cold.   Patient reports nocturia (nighttime urination) - patient states it is related to his medication for BPH.  Patient denies neuropathy (nerve pain). Patient denies visual changes.     O:    Physical Exam Vitals reviewed.  Constitutional:      Appearance: Normal appearance.  Cardiovascular:     Rate and Rhythm: Normal rate.  Psychiatric:        Mood and Affect: Mood normal.        Behavior: Behavior normal.        Thought Content: Thought content normal.        Judgment: Judgment normal.      Review of Systems  All other systems reviewed and are negative.  PHQ-9 score is 0.   Lab Results  Component Value Date   HGBA1C 7.7 (A) 11/25/2019   Vitals:   02/22/20 1042  BP: 140/90  Pulse: 69  SpO2: 98%    Lipid Panel     Component Value Date/Time   CHOL 108 03/31/2019 1140   TRIG 99 03/31/2019 1140   HDL 42 03/31/2019 1140   CHOLHDL 2.6 03/31/2019 1140   CHOLHDL 2.5 10/26/2014 1118   VLDL 20 10/26/2014 1118   LDLCALC 47 03/31/2019 1140   LDLDIRECT 72 06/04/2012 0948    Home fasting blood sugars: 110s-190s  2 hour post-meal/random blood sugars: 170s-250s.   Clinical Atherosclerotic Cardiovascular Disease (ASCVD): Yes  - history of stroke The ASCVD Risk score Mikey Bussing DC Jr., et al., 2013) failed to calculate for the following reasons:   The valid total cholesterol range is 130 to 320 mg/dL    A/P:  Diabetes currently uncontrolled with most recent A1c 7.7. Patient is not experiencing hallmark signs of hypoglycemia and blood sugars are not truly hypoglycemic when he is experiencing lightheaded symptoms. Question of whether this  is truly hypoglycemia versus orthostasis. Patient is able to verbalize appropriate hypoglycemia management plan. Medication adherence appears to be consistent, however he changed his metformin dosing from 1000mg  BID to 500mg  TID.  -Patient instructed to call [employer] to ask if they will cover any CGM device for him. Then call us back to discuss next steps.  -Continue current medication regimen -Extensively discussed pathophysiology of diabetes, recommended lifestyle interventions, dietary effects on blood sugar control -Counseled on  s/sx of and management of hypoglycemia -Next A1C anticipated this month.   ASCVD risk - secondary prevention in patient with diabetes. Last LDL is controlled. High intensity statin indicated. Aspirin is indicated.  -Continued aspirin 81 mg  -Continued rosuvastatin 20 mg.    Written patient instructions provided.  Total time in face to face counseling 15 minutes.   Follow up Pharmacist phone call when patient has heard back from [facility] regarding CGM coverage. Follow up with PCP as appropriate.  Patient seen with Mickeal Skinner, PharmD Candidate and Rebbeca Paul, PharmD - PGY-1 Resident.

## 2020-02-23 MED ORDER — DEXCOM G6 TRANSMITTER MISC
1.0000 | 3 refills | Status: DC
Start: 2020-02-23 — End: 2022-08-30

## 2020-02-23 MED ORDER — DEXCOM G6 SENSOR MISC
1.0000 | 11 refills | Status: DC
Start: 2020-02-23 — End: 2022-08-30

## 2020-02-23 NOTE — Addendum Note (Signed)
Addended by: Leavy Cella on: 02/23/2020 11:03 AM   Modules accepted: Orders

## 2020-02-23 NOTE — Progress Notes (Signed)
Patient returned call and stated that his insurance would 100% cover his CGM.  He asked [place] to process his prescriptions to [facility] mail order.   New orders placed for [facility] and [employer] + refills.  (Patient has I-phone).   Patient realizes that his CGM will come with a prior authorization.  When he gets supplies, he was encouraged to schedule a visit in the [facility] a that time.  I will assist him in getting started.

## 2020-02-23 NOTE — Progress Notes (Signed)
Reviewed: Agree with Dr. Koval's documentation and management. 

## 2020-02-25 ENCOUNTER — Telehealth: Payer: Self-pay

## 2020-02-25 ENCOUNTER — Other Ambulatory Visit: Payer: Self-pay | Admitting: Family Medicine

## 2020-02-25 MED ORDER — NAPROXEN 500 MG PO TABS
500.0000 mg | ORAL_TABLET | Freq: Once | ORAL | 0 refills | Status: DC | PRN
Start: 2020-02-25 — End: 2020-03-09
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
## 2020-02-25 NOTE — Telephone Encounter (Signed)
Called pt to discuss his back pain. He said Naproxen is the only medicine which works with his back pain when it is cold and raining. Refilled Naproxen 500mg  once daily 30 tablets [facility] Also booked a follow up appointment for back pain on 8th September with me.

## 2020-02-26 ENCOUNTER — Other Ambulatory Visit (INDEPENDENT_AMBULATORY_CARE_PROVIDER_SITE_OTHER): Payer: Medicare HMO

## 2020-02-26 ENCOUNTER — Telehealth: Payer: Self-pay | Admitting: Pharmacist

## 2020-02-26 ENCOUNTER — Other Ambulatory Visit: Payer: Self-pay

## 2020-02-26 DIAGNOSIS — E119 Type 2 diabetes mellitus without complications: Secondary | ICD-10-CM | POA: Diagnosis not present

## 2020-02-26 LAB — POCT GLYCOSYLATED HEMOGLOBIN (HGB A1C): HbA1c, POC (controlled diabetic range): 7.4 % — AB (ref 0.0–7.0)

## 2020-02-26 NOTE — Telephone Encounter (Signed)
Patient called and requested assistance with obtaining CGM.   He stated that the only way for [facility] to cover his CGM was if his doctor would write a letter that he was unable to continue performing finger stick blood sugar checks.  He states his fingers cannot tolerate the testing frequency.   I shared with him that the usual coverage is limited to patients with multiple injections per day and I was surprised that they may cover.    He requested that his physician "write a letter" supporting his need for CGM as he cannot perform finger blood glucose tests.  I told him I would forward to his provider.  He requested that Dr. Posey Pronto write the letter.    I am sending to both Drs. Posey Pronto and Waterman as the patient appears to be transitioning providers and I am unclear who is available/willing to respond.    The letter should be sent directly to [place] - via Fax number. [place], [place] Phone:  [phone]  Fax:  [phone]     Let me know if I can be of further assistance.

## 2020-02-29 NOTE — Telephone Encounter (Signed)
Thank you Dr Valentina Lucks. I will get this letter out for his CGM.

## 2020-03-03 ENCOUNTER — Other Ambulatory Visit: Payer: Self-pay | Admitting: *Deleted

## 2020-03-03 DIAGNOSIS — N183 Chronic kidney disease, stage 3 unspecified: Secondary | ICD-10-CM

## 2020-03-03 DIAGNOSIS — I10 Essential (primary) hypertension: Secondary | ICD-10-CM

## 2020-03-03 MED ORDER — AMLODIPINE BESYLATE 10 MG PO TABS: 10.0000 mg | ORAL_TABLET | Freq: Every day | ORAL | 3 refills | Status: DC

## 2020-03-03 MED ORDER — TRUE METRIX BLOOD GLUCOSE TEST VI STRP: ORAL_STRIP | 12 refills | Status: DC

## 2020-03-08 ENCOUNTER — Other Ambulatory Visit: Payer: Self-pay | Admitting: *Deleted

## 2020-03-08 NOTE — Telephone Encounter (Signed)
Looks like pt is now using [employer].  Received request for refill on Naproxen.  It was prescribed on 02/25/2020 [facility]  Please send new rx to [place].Andrew [name], CMA

## 2020-03-09 ENCOUNTER — Other Ambulatory Visit: Payer: Self-pay | Admitting: Family Medicine

## 2020-03-09 ENCOUNTER — Telehealth: Payer: Self-pay | Admitting: *Deleted

## 2020-03-09 MED ORDER — NAPROXEN 500 MG PO TABS: 500.0000 mg | ORAL_TABLET | Freq: Once | ORAL | 0 refills | Status: DC | PRN

## 2020-03-09 NOTE — Telephone Encounter (Signed)
Pt informed. Pt informed me that all medications need to be sent to [facility] from now on. Ottis Stain, CMA

## 2020-03-09 NOTE — Telephone Encounter (Signed)
Received fax from pts [place] stating that the Rx is missing the dosing frequency.  Please correct and resend. Logan Baltimore Zimmerman Rumple, CMA

## 2020-03-09 NOTE — Telephone Encounter (Signed)
I have sent Naproxen to [place]. Thank you.

## 2020-03-10 ENCOUNTER — Other Ambulatory Visit: Payer: Self-pay | Admitting: Family Medicine

## 2020-03-10 MED ORDER — NAPROXEN 500 MG PO TABS: 500.0000 mg | ORAL_TABLET | Freq: Every day | ORAL | 0 refills | Status: DC

## 2020-03-10 NOTE — Telephone Encounter (Signed)
Prescription has been sent with correction. Thank you.

## 2020-03-16 ENCOUNTER — Other Ambulatory Visit: Payer: Self-pay

## 2020-03-16 ENCOUNTER — Ambulatory Visit (INDEPENDENT_AMBULATORY_CARE_PROVIDER_SITE_OTHER): Payer: Medicare HMO | Admitting: Family Medicine

## 2020-03-16 ENCOUNTER — Telehealth: Payer: Self-pay

## 2020-03-16 ENCOUNTER — Encounter: Payer: Self-pay | Admitting: Family Medicine

## 2020-03-16 DIAGNOSIS — I10 Essential (primary) hypertension: Secondary | ICD-10-CM | POA: Diagnosis not present

## 2020-03-16 MED ORDER — CHLORTHALIDONE 25 MG PO TABS
25.0000 mg | ORAL_TABLET | Freq: Every day | ORAL | 3 refills | Status: DC
Start: 2020-03-16 — End: 2020-04-03

## 2020-03-16 MED ORDER — ENALAPRIL MALEATE 20 MG PO TABS: 40.0000 mg | ORAL_TABLET | Freq: Every day | ORAL | 3 refills | Status: DC

## 2020-03-16 NOTE — Telephone Encounter (Signed)
Case discussed with Dr. Higinio Plan and Posey Pronto. Both are ok with the switch.  I advised the patient that he is allowed only one time PCP switch request per our policy and will not be allowed future request. He is aware Dr. Higinio Plan graduates in few months.

## 2020-03-16 NOTE — Progress Notes (Signed)
    SUBJECTIVE:   CHIEF COMPLAINT / HPI:   Andrew [name] is a 64 yr old male who presents today for HTN follow up   HTN  Pt unsure which medications is taking and which dose. Per file should be taking Amlodipine 10mg  daily daily and enalapril 20mg  BID. He saw Dr Higinio Plan for HTN management previously. He is frustrated that he sees different providers each time at the clinic. Denies chest pains on exertion, dizziness, palpitations, syncope, headaches, vision change, cough or no lower extremity edema. Denies smoking.  Denies side effects of antihypertensives.   PERTINENT  PMH / PSH: HTN, back pain,   OBJECTIVE:   BP (!) 158/62   Pulse 70   Ht 6\' 1"  (1.854 m)   Wt 220 lb (99.8 kg)   SpO2 97%   BMI 29.03 kg/m    General: Alert, no acute distress, appears stated age Cardio: Normal S1 and S2, RRR Pulm:  CTAB, Normal respiratory effort Abdomen: Bowel sounds normal. Abdomen soft and non-tender.  Extremities: No peripheral edema. Warm/ well perfused.   Neuro: Cranial nerves grossly intact  ASSESSMENT/PLAN:   Essential hypertension BP poorly controlled today. BP 158/62. Repeat 160/80. Added Chlorthalidone 25mg  daily today. Clarified that pt should take Amlodipine 10mg , Enalapril 20mg  BID and Chlorthalidone as above. Booked pt for nurse visit to check BP next week.     Lattie Haw, MD PGY-2 [place]

## 2020-03-16 NOTE — Patient Instructions (Signed)
[name],  It was great to see you today!! Your blood pressure is still high today.  I am starting you on a new blood pressure medication called chlorthalidone.  You are now taking 3 blood pressure pills: Amlodipine 10 mg once daily Enalapril 40 mg once daily Chlorthalidone 25 mg once daily Please follow-up next week for a nurse visit they will check your blood pressure.   Please arrange a follow-up appointment for your back pai.  Please take the naproxen 50 mg twice daily only.  Any higher doses may cause bleeding in the stomach and stomach ulcers  See you soon, Best wishes Dr. Posey Pronto

## 2020-03-16 NOTE — Telephone Encounter (Signed)
Patient calls nurse line expressing frustrations with changing PCP. Patient states that he has been requesting change since June and does not understand what is taking so long. Informed patient of process for changing PCP. Patient is wanting to switch from Dr. Posey Pronto to Dr. Higinio Plan.   To Dr. Hinda Kehr, RN

## 2020-03-20 NOTE — Assessment & Plan Note (Signed)
BP poorly controlled today. BP 158/62. Repeat 160/80. Added Chlorthalidone 25mg  daily today. Clarified that pt should take Amlodipine 10mg , Enalapril 20mg  BID and Chlorthalidone as above. Booked pt for nurse visit to check BP next week.

## 2020-03-22 ENCOUNTER — Other Ambulatory Visit: Payer: Self-pay

## 2020-03-22 ENCOUNTER — Ambulatory Visit (INDEPENDENT_AMBULATORY_CARE_PROVIDER_SITE_OTHER): Payer: Medicare HMO

## 2020-03-22 VITALS — BP 149/71 | HR 58

## 2020-03-22 DIAGNOSIS — Z0130 Encounter for examination of blood pressure without abnormal findings: Secondary | ICD-10-CM

## 2020-03-22 NOTE — Progress Notes (Signed)
Patient here today for BP check.      Last BP was on 03/16/2020 and was 158/62.  BP today is 149/71 with a pulse of 58.    Checked BP in right arm with normal cuff.    Symptoms present: none.   Patient has been taking 10 mg of amlodipine and 20 mg of enalapril as prescribed.   Precepted with Dr. McDiarmid who was agreeable with plan for patient to follow up with PCP in one week.   Return/ ED precautions given.   Routed note to PCP.         Talbot Grumbling, RN

## 2020-03-25 ENCOUNTER — Other Ambulatory Visit: Payer: Self-pay

## 2020-03-25 DIAGNOSIS — E1122 Type 2 diabetes mellitus with diabetic chronic kidney disease: Secondary | ICD-10-CM

## 2020-03-25 MED ORDER — TRUE METRIX BLOOD GLUCOSE TEST VI STRP: ORAL_STRIP | 12 refills | Status: DC

## 2020-03-30 ENCOUNTER — Other Ambulatory Visit: Payer: Self-pay

## 2020-03-30 ENCOUNTER — Encounter: Payer: Self-pay | Admitting: Family Medicine

## 2020-03-30 ENCOUNTER — Ambulatory Visit (INDEPENDENT_AMBULATORY_CARE_PROVIDER_SITE_OTHER): Payer: Medicare HMO | Admitting: Family Medicine

## 2020-03-30 ENCOUNTER — Other Ambulatory Visit: Payer: Self-pay | Admitting: Family Medicine

## 2020-03-30 VITALS — BP 138/60 | HR 75 | Ht 73.0 in | Wt 217.6 lb

## 2020-03-30 DIAGNOSIS — E1122 Type 2 diabetes mellitus with diabetic chronic kidney disease: Secondary | ICD-10-CM

## 2020-03-30 DIAGNOSIS — N183 Chronic kidney disease, stage 3 unspecified: Secondary | ICD-10-CM

## 2020-03-30 DIAGNOSIS — R42 Dizziness and giddiness: Secondary | ICD-10-CM | POA: Diagnosis not present

## 2020-03-30 DIAGNOSIS — I10 Essential (primary) hypertension: Secondary | ICD-10-CM

## 2020-03-30 DIAGNOSIS — E119 Type 2 diabetes mellitus without complications: Secondary | ICD-10-CM

## 2020-03-30 NOTE — Patient Instructions (Addendum)
It was great to see you today.  We can try to switch around the portions of your diet, I would encourage you to have a slightly larger dinner to see if this impacts how you feel in the morning, if this is not benefiting in about 2 weeks you could transition to smaller portions but more frequently throughout the day.  You can drink about 3-4 bottles a day of water.  Try to avoid too many salty foods, however it is okay to have some in your diet.  Continue checking your blood pressure every couple of days and keeping a journal, bring your blood pressure cuff/machine on your follow-up visit so we can compare them here.  [facility] and then send in more when it comes back to [facility].

## 2020-03-30 NOTE — Progress Notes (Signed)
    SUBJECTIVE:   CHIEF COMPLAINT / HPI: Follow-up medications  Andrew [name] is a 64 year old gentleman presenting for follow-up and discuss the following:  Hypertension: Takes amlodipine 10 mg in enalapril 20 mg daily.  Recent blood pressure check on 9/14 showing BP 149/71 and office visit on 9/8 was 158/62.  Prescribed chlorthalidone however he has not been taking this due to blood pressures at home been under control.  BP journal at home showing systolics around 511 and diastolics around 60.  Lightheadedness: Journaled about 7 episodes since 8/1.  Majority of episodes in the morning, however has had episodes in the afternoon as well.  He eats a normal breakfast and lunch and then usually has a small snack for dinner around 5 PM.  No glucose readings under 130 during these episodes.  Episode will resolve after drinking a glass of water or having a small snack. Not with position changes.   PERTINENT  PMH / PSH: Hypertension, type 2 diabetes, hyperlipidemia, osteoarthritis, chronic gout  OBJECTIVE:   BP 138/60   Pulse 75   Ht 6\' 1"  (1.854 m)   Wt 217 lb 9.6 oz (98.7 kg)   SpO2 98%   BMI 28.71 kg/m   General: Alert, NAD HEENT: NCAT, MMM, EOMI  Cardiac: RRR  Lungs: Clear bilaterally, no increased WOB  Msk: Moves all extremities spontaneously  Ext: Warm, dry   ASSESSMENT/PLAN:   Essential hypertension BP appears well controlled at home through journal.  Will continue with Norvasc 10 mg and enalapril 20 mg daily, never started chlorthalidone.  Recommended bringing in BP cuff on next visit to ensure home accuracy.   Episodic lightheadedness Chronic and resolved with small snack or glass of water.  Through home journal, does not appear to have any relative hypoglycemia or hypotension during these episodes.  Mainly seem to occur in the morning after prolonged fast, encouraged dinner around 6-7 PM and slightly larger.  Can also try more frequent smaller meals throughout the day to see if  this has an impact.  Type 2 diabetes mellitus without complications (HCC) M2T 7.4 in 02/2020.  Refilled glucose test strips during episodes as above per patient preference.     Follow-up in 1 month for the above concerns or sooner if needed.  [name], [place]

## 2020-03-31 MED ORDER — TRUE METRIX BLOOD GLUCOSE TEST VI STRP: ORAL_STRIP | 12 refills | Status: DC

## 2020-04-01 ENCOUNTER — Other Ambulatory Visit: Payer: Self-pay

## 2020-04-01 ENCOUNTER — Ambulatory Visit (INDEPENDENT_AMBULATORY_CARE_PROVIDER_SITE_OTHER): Payer: Medicare HMO

## 2020-04-01 DIAGNOSIS — Z23 Encounter for immunization: Secondary | ICD-10-CM

## 2020-04-01 NOTE — Progress Notes (Signed)
Pt is here today for a annual flu shot. Flu shot given in RD, site unremarkable, with no issues.  Pt tolerated well. Talbot Grumbling, RN

## 2020-04-03 ENCOUNTER — Encounter: Payer: Self-pay | Admitting: Family Medicine

## 2020-04-03 NOTE — Assessment & Plan Note (Signed)
BP appears well controlled at home through journal.  Will continue with Norvasc 10 mg and enalapril 20 mg daily, never started chlorthalidone.  Recommended bringing in BP cuff on next visit to ensure home accuracy.

## 2020-04-03 NOTE — Assessment & Plan Note (Signed)
A1c 7.4 in 02/2020.  Refilled glucose test strips during episodes as above per patient preference.

## 2020-04-03 NOTE — Assessment & Plan Note (Signed)
Chronic and resolved with small snack or glass of water.  Through home journal, does not appear to have any relative hypoglycemia or hypotension during these episodes.  Mainly seem to occur in the morning after prolonged fast, encouraged dinner around 6-7 PM and slightly larger.  Can also try more frequent smaller meals throughout the day to see if this has an impact.

## 2020-04-13 ENCOUNTER — Other Ambulatory Visit: Payer: Self-pay | Admitting: Family Medicine

## 2020-04-13 DIAGNOSIS — N183 Chronic kidney disease, stage 3 unspecified: Secondary | ICD-10-CM

## 2020-04-25 ENCOUNTER — Other Ambulatory Visit: Payer: Self-pay | Admitting: Family Medicine

## 2020-04-28 ENCOUNTER — Ambulatory Visit (INDEPENDENT_AMBULATORY_CARE_PROVIDER_SITE_OTHER): Payer: Medicare HMO | Admitting: Family Medicine

## 2020-04-28 ENCOUNTER — Other Ambulatory Visit: Payer: Self-pay

## 2020-04-28 ENCOUNTER — Encounter: Payer: Self-pay | Admitting: Family Medicine

## 2020-04-28 VITALS — BP 134/78 | HR 81 | Ht 73.0 in | Wt 224.2 lb

## 2020-04-28 DIAGNOSIS — Z23 Encounter for immunization: Secondary | ICD-10-CM

## 2020-04-28 DIAGNOSIS — M2042 Other hammer toe(s) (acquired), left foot: Secondary | ICD-10-CM | POA: Diagnosis not present

## 2020-04-28 DIAGNOSIS — E119 Type 2 diabetes mellitus without complications: Secondary | ICD-10-CM | POA: Diagnosis not present

## 2020-04-28 DIAGNOSIS — M1A09X0 Idiopathic chronic gout, multiple sites, without tophus (tophi): Secondary | ICD-10-CM | POA: Diagnosis not present

## 2020-04-28 DIAGNOSIS — L989 Disorder of the skin and subcutaneous tissue, unspecified: Secondary | ICD-10-CM

## 2020-04-28 DIAGNOSIS — M1990 Unspecified osteoarthritis, unspecified site: Secondary | ICD-10-CM

## 2020-04-28 DIAGNOSIS — I10 Essential (primary) hypertension: Secondary | ICD-10-CM

## 2020-04-28 MED ORDER — NAPROXEN 500 MG PO TABS: 500.0000 mg | ORAL_TABLET | Freq: Two times a day (BID) | ORAL | 0 refills | Status: DC | PRN

## 2020-04-28 NOTE — Assessment & Plan Note (Signed)
Well-controlled.  Continue Norvasc 10 mg and enalapril 20 mg daily.

## 2020-04-28 NOTE — Assessment & Plan Note (Signed)
Second digit, chronic following fracture in 2014 with recurrent pain related to the area.  Recommended follow-up with podiatry, already established with [facility], to discuss any further management/candidate for surgical.  Rx'd naproxen to use as needed and recommended trialing Voltaren gel.  Already using OTC orthotics.

## 2020-04-28 NOTE — Assessment & Plan Note (Signed)
A1c 7.4 in 02/2020.  Continue Metformin as is.  Next A1c due in 1 month.
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
## 2020-04-28 NOTE — Patient Instructions (Signed)
It was wonderful to see you today.  I recommend you call back [facility] to schedule a follow-up appointment to evaluate your left toe.  In addition we can get an x-ray of your right foot to make sure there is not any small piece of foreign object causing that soreness.  Keep up the great work with watching on your blood pressure and sugar.

## 2020-04-28 NOTE — Assessment & Plan Note (Signed)
Worried that he has had flares with his allopurinol.  No current concern for flare.  Will check uric acid level today to ensure appropriate allopurinol dose.  Continue allopurinol 2000 mg for now.

## 2020-04-28 NOTE — Assessment & Plan Note (Signed)
Brown macule present on sole of foot for unclear duration.  Particularly with pinpoint tenderness in this area, do question if there is a foreign object present however unable to see any on exam today.  Recommended obtaining XR, he would like to wait until evaluation podiatry with x-rays within their clinic if needed.  Could also consider healing blister, melanoma and will monitor.

## 2020-05-04 ENCOUNTER — Ambulatory Visit: Payer: Medicare HMO

## 2020-05-04 ENCOUNTER — Ambulatory Visit: Payer: Medicare HMO | Admitting: Podiatry

## 2020-05-04 ENCOUNTER — Other Ambulatory Visit: Payer: Self-pay

## 2020-05-04 ENCOUNTER — Ambulatory Visit (INDEPENDENT_AMBULATORY_CARE_PROVIDER_SITE_OTHER): Payer: Medicare HMO

## 2020-05-04 DIAGNOSIS — S9031XA Contusion of right foot, initial encounter: Secondary | ICD-10-CM | POA: Diagnosis not present

## 2020-05-04 DIAGNOSIS — M10372 Gout due to renal impairment, left ankle and foot: Secondary | ICD-10-CM | POA: Diagnosis not present

## 2020-05-04 DIAGNOSIS — M7752 Other enthesopathy of left foot: Secondary | ICD-10-CM | POA: Diagnosis not present

## 2020-05-04 DIAGNOSIS — M109 Gout, unspecified: Secondary | ICD-10-CM

## 2020-05-04 DIAGNOSIS — S9031XD Contusion of right foot, subsequent encounter: Secondary | ICD-10-CM | POA: Diagnosis not present

## 2020-05-04 DIAGNOSIS — M19079 Primary osteoarthritis, unspecified ankle and foot: Secondary | ICD-10-CM

## 2020-05-05 ENCOUNTER — Encounter: Payer: Self-pay | Admitting: Podiatry

## 2020-05-05 NOTE — Progress Notes (Signed)
Subjective:  Patient ID: Andrew [name], male    DOB: 1955-09-22,  MRN: [id number]  Chief Complaint  Patient presents with  . Foot Pain    pt states he plantar foot pain     64 y.o. male presents with the above complaint.  Patient presents with bilateral complaints of first MPJ joint pain.  Patient states the left side is worse than right side.  Patient states that he has had gout in the past.  And the bump on it is also painful.  He denies being on any kind of gout medication.  He denies any other acute complaints.  He would like to discuss treatment options for this.  He has not seen anyone else prior to seeing me.  He has not really received any injection or made any shoe gear modification as well.   Review of Systems: Negative except as noted in the HPI. Denies N/V/F/Ch.  Past Medical History:  Diagnosis Date  . Arthritis   . Chronic left shoulder pain 09/20/2017  . CRD (chronic renal disease), stage II    Baseline Cr 1.2  . CVA (cerebral vascular accident) ([place])    x4 last one in 2007, R sided residual weakness  . HAMMER TOE 07/25/2010  . HLD (hyperlipidemia)   . HTN (hypertension)   . Hypothyroidism   . Leg swelling   . Lipoma of back 03/18/2011  . Personal history of colonic adenomas 04/01/2013  . Prostatitis    hx of x2  . PSEUDOFOLLICULITIS BARBAE 0/0/7121   Qualifier: Diagnosis of  By: Danise Mina  MD, Garlon Hatchet    . Sarcoidosis   . 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 T2DM (type 2 diabetes mellitus) ([place])     Current Outpatient Medications:  .  Acetaminophen (TYLENOL ARTHRITIS EXT RELIEF PO), Take 650 mg by mouth 1 day or 1 dose., Disp: , Rfl:  .  allopurinol (ZYLOPRIM) 100 MG tablet, Take 2 tablets (200 mg total) by mouth daily., Disp: 180 tablet, Rfl: 3 .  amLODipine (NORVASC) 10 MG tablet, Take 1 tablet (10 mg total) by mouth daily., Disp: 30 tablet, Rfl: 3 .  aspirin EC 81 MG tablet, Take 1 tablet (81 mg total) by mouth daily., Disp: 90 tablet, Rfl:  2 .  betamethasone dipropionate 0.05 % cream, APPLY TO AFFECTED AREA TWICE A DAY, Disp: 30 g, Rfl: 0 .  Blood Glucose Monitoring Suppl (TRUE METRIX AIR GLUCOSE METER) w/Device KIT, 1 each by Does not apply route 2 (two) times daily. Use to test blood sugar two times a day.  DX code: E11.9, Disp: 1 kit, Rfl: 0 .  cholecalciferol (VITAMIN D) 25 MCG (1000 UNIT) tablet, TAKE 1 TABLET EVERY DAY, Disp: 90 tablet, Rfl: 3 .  Continuous Blood Gluc Sensor (DEXCOM G6 SENSOR) MISC, Inject 1 applicator into the skin as directed. Change sensor every 10 days., Disp: 3 each, Rfl: 11 .  Continuous Blood Gluc Transmit (DEXCOM G6 TRANSMITTER) MISC, Inject 1 Device into the skin as directed. Reuse 8 times with sensor changes., Disp: 1 each, Rfl: 3 .  enalapril (VASOTEC) 20 MG tablet, Take 2 tablets (40 mg total) by mouth daily., Disp: 180 tablet, Rfl: 3 .  Ginger, Zingiber officinalis, (GINGER PO), Take 1 each by mouth daily. Boils fresh ginger and drinks as tea., Disp: , Rfl:  .  glucose blood (TRUE METRIX BLOOD GLUCOSE TEST) test strip, TEST BLOOD SUGAR TWICE DAILY, Disp: 500 strip, Rfl: 2 .  latanoprost (XALATAN) 0.005 % ophthalmic solution,  Place 1 drop into both eyes at bedtime., Disp: , Rfl:  .  levothyroxine (SYNTHROID) 75 MCG tablet, TAKE 1 TABLET EVERY DAY  BEFORE  BREAKFAST, Disp: 90 tablet, Rfl: 3 .  metFORMIN (GLUCOPHAGE-XR) 500 MG 24 hr tablet, Take 2 tablets (1,000 mg total) by mouth 2 (two) times daily., Disp: 360 tablet, Rfl: 2 .  naproxen (NAPROSYN) 500 MG tablet, Take 1 tablet (500 mg total) by mouth 2 (two) times daily as needed for moderate pain. Each time try 1/2 tablet, if not helping pain, increase to 1 tab., Disp: 30 tablet, Rfl: 0 .  rosuvastatin (CRESTOR) 20 MG tablet, TAKE 1 TABLET (20 MG TOTAL) BY MOUTH DAILY., Disp: 90 tablet, Rfl: 3 .  tamsulosin (FLOMAX) 0.4 MG CAPS capsule, TAKE 2 CAPSULES EVERY DAY, Disp: 180 capsule, Rfl: 3 .  Turmeric POWD, Take 5 mLs by mouth daily. Mixes 1 teaspoon in  water every day and drinks it. Uses for arthritis., Disp: , Rfl:   Social History   Tobacco Use  Smoking Status Never Smoker  Smokeless Tobacco Never Used  Tobacco Comment   Never smoker; no plans to start    Allergies  Allergen Reactions  . Metoprolol Succinate Other (See Comments)    Bradycardia (HR to 42)  . Penicillins Itching    Has patient had a PCN reaction causing immediate rash, facial/tongue/throat swelling, SOB or lightheadedness with hypotension: No Has patient had a PCN reaction causing severe rash involving mucus membranes or skin necrosis: No Has patient had a PCN reaction that required hospitalization No Has patient had a PCN reaction occurring within the last 10 years: No If all of the above answers are "NO", then may proceed with Cephalosporin use.  . Empagliflozin Other (See Comments)    Dizzy , Dysphoria,  Patient preference to avoid use.    Objective:  There were no vitals filed for this visit. There is no height or weight on file to calculate BMI. Constitutional Well developed. Well nourished.  Vascular Dorsalis pedis pulses palpable bilaterally. Posterior tibial pulses palpable bilaterally. Capillary refill normal to all digits.  No cyanosis or clubbing noted. Pedal hair growth normal.  Neurologic Normal speech. Oriented to person, place, and time. Epicritic sensation to light touch grossly present bilaterally.  Dermatologic Nails well groomed and normal in appearance. No open wounds. No skin lesions.  Orthopedic:  Pain on palpation to the left first metatarsophalangeal joint.  Pain with range of motion of the first MPJ.  Mild intra-articular pain noted.  Pain on palpation of the right first metatarsophalangeal joint mild pain on palpation to the joint.   Radiographs: 3 views of skeletally mature adult bilateral foot: Osteoarthritic changes noted to the midfoot bilaterally.  Osteoarthrosis noted to the first metatarsophalangeal joint bilaterally with  left greater than right side.  Generalized osteopenia noted.  No other bony abnormalities noted.  Severe pes planus foot structure noted as well. Assessment:   1. Capsulitis of metatarsophalangeal (MTP) joint of left foot   2. Osteoarthritis of ankle and foot, unspecified laterality    Plan:  Patient was evaluated and treated and all questions answered.  Left first metatarsophalangeal joint capsulitis with underlying osteoarthritis left greater than right -I explained to the patient the etiology of osteoarthritis and various treatment options were discussed.  I believe patient will benefit from a steroid injection given that this is worse than right side.  This will help decrease the acute inflammatory component associated with pain.  Patient agrees with the plan would  like to proceed with a steroid injection.  I will hold off on a steroid injection to the right side as it is not acutely painful. -A steroid injection was performed at left first metatarsophalangeal joint using 1% plain Lidocaine and 10 mg of Kenalog. This was well tolerated.   No follow-ups on file.

## 2020-05-06 ENCOUNTER — Other Ambulatory Visit: Payer: Self-pay | Admitting: Family Medicine

## 2020-05-06 DIAGNOSIS — I10 Essential (primary) hypertension: Secondary | ICD-10-CM

## 2020-05-09 ENCOUNTER — Other Ambulatory Visit: Payer: Self-pay | Admitting: Family Medicine

## 2020-05-09 ENCOUNTER — Other Ambulatory Visit: Payer: Self-pay

## 2020-05-09 ENCOUNTER — Other Ambulatory Visit (INDEPENDENT_AMBULATORY_CARE_PROVIDER_SITE_OTHER): Payer: Medicare HMO

## 2020-05-09 DIAGNOSIS — M1A09X0 Idiopathic chronic gout, multiple sites, without tophus (tophi): Secondary | ICD-10-CM

## 2020-05-09 DIAGNOSIS — E119 Type 2 diabetes mellitus without complications: Secondary | ICD-10-CM

## 2020-05-09 LAB — POCT GLYCOSYLATED HEMOGLOBIN (HGB A1C): Hemoglobin A1C: 7.5 % — AB (ref 4.0–5.6)

## 2020-05-10 LAB — URIC ACID: Uric Acid: 3.7 mg/dL — ABNORMAL LOW (ref 3.8–8.4)

## 2020-05-18 ENCOUNTER — Other Ambulatory Visit: Payer: Self-pay | Admitting: Family Medicine

## 2020-05-18 DIAGNOSIS — M1990 Unspecified osteoarthritis, unspecified site: Secondary | ICD-10-CM

## 2020-05-19 ENCOUNTER — Ambulatory Visit (INDEPENDENT_AMBULATORY_CARE_PROVIDER_SITE_OTHER): Payer: Medicare HMO | Admitting: Family Medicine

## 2020-05-19 ENCOUNTER — Other Ambulatory Visit: Payer: Self-pay

## 2020-05-19 ENCOUNTER — Encounter: Payer: Self-pay | Admitting: Family Medicine

## 2020-05-19 VITALS — BP 144/62 | HR 72 | Ht 73.0 in | Wt 225.0 lb

## 2020-05-19 DIAGNOSIS — M1990 Unspecified osteoarthritis, unspecified site: Secondary | ICD-10-CM | POA: Diagnosis not present

## 2020-05-19 DIAGNOSIS — M2042 Other hammer toe(s) (acquired), left foot: Secondary | ICD-10-CM

## 2020-05-19 DIAGNOSIS — E1169 Type 2 diabetes mellitus with other specified complication: Secondary | ICD-10-CM | POA: Diagnosis not present

## 2020-05-19 DIAGNOSIS — I10 Essential (primary) hypertension: Secondary | ICD-10-CM

## 2020-05-19 DIAGNOSIS — M1A09X0 Idiopathic chronic gout, multiple sites, without tophus (tophi): Secondary | ICD-10-CM

## 2020-05-19 DIAGNOSIS — E785 Hyperlipidemia, unspecified: Secondary | ICD-10-CM

## 2020-05-19 DIAGNOSIS — E119 Type 2 diabetes mellitus without complications: Secondary | ICD-10-CM | POA: Diagnosis not present

## 2020-05-19 NOTE — Progress Notes (Signed)
° ° °  SUBJECTIVE:   CHIEF COMPLAINT / HPI:  Follow up   Andrew [name] is a 64 year old gentleman presenting for a general follow-up.   Hypertension: He still is taking his enalapril and Norvasc as prescribed.  Just had some fried chicken recently which is why he feels his blood pressure is up a little bit higher today.  Measures his BP at home and is at goal.   T2DM: A1c at recent lab visit 7.5%, previously 7.4%.  He is taking Metformin only.  For the past 3 months he has not started walking 6 days a week about 1 mile in trying to incorporate more whole foods into his diet.  He is very motivated to continue towards lifestyle changes.  He still has intermittent lightheadedness whenever he feels like his sugar gets a little bit lower, has had less episodes of this.  Feels immediately better after drinking some Ensure.  Gout: On chronic allopurinol.  Would like to discuss his recent uric acid level.  He also recently followed up with his podiatrist and got a steroid injection in his foot for arthritis, not having any more pain in his feet.  PERTINENT  PMH / PSH: Hypertension, type 2 diabetes, hyperlipidemia, osteoarthritis, chronic gout  OBJECTIVE:   BP (!) 144/62    Pulse 72    Ht 6\' 1"  (1.854 m)    Wt 225 lb (102.1 kg)    SpO2 96%    BMI 29.69 kg/m   General: Alert, NAD HEENT: NCAT, MMM Cardiac: RRR no m/g/r Lungs: Clear bilaterally, no increased WOB  Msk: Moves all extremities spontaneously, normal gait without assistance  Ext: Warm, dry, 2+ distal pulses, no edema   ASSESSMENT/PLAN:   Essential hypertension Well-controlled at home.  Will continue enalapril and Norvasc as is.  Type 2 diabetes mellitus without complications (HCC) L2G 7.5, slightly above goal of <7%.  He is adamant on working towards lifestyle changes and provided encouragement.  Will continue Metformin as is.  May continue monitoring CBGs with any hypoglycemic symptoms.  Following up with his ophthalmologist next week for  diabetic eye exam, will obtain results afterwards.  Hyperlipidemia associated with type 2 diabetes mellitus Crestor for secondary prevention (previous CVA).  Will recheck lipid panel on next visit.  Chronic gout of multiple sites Discussed uric acid at goal, will continue current dose of allopurinol for prevention.  Arthritis Known left first metatarsophalangeal joint capsulitis with underlying osteoarthritis.  Recent steroid injection via his podiatrist, Dr. Posey Pronto, with resolution of pain.  Will monitor.    Follow-up in approximately 3 months or sooner if needed.  Andrew [name], [place]

## 2020-05-19 NOTE — Assessment & Plan Note (Signed)
Known left first metatarsophalangeal joint capsulitis with underlying osteoarthritis.  Recent steroid injection via his podiatrist, Dr. Posey Pronto, with resolution of pain.  Will monitor.

## 2020-05-19 NOTE — Assessment & Plan Note (Signed)
A1c 7.5, slightly above goal of <7%.  He is adamant on working towards lifestyle changes and provided encouragement.  Will continue Metformin as is.  May continue monitoring CBGs with any hypoglycemic symptoms.  Following up with his ophthalmologist next week for diabetic eye exam, will obtain results afterwards.

## 2020-05-19 NOTE — Patient Instructions (Signed)
It was wonderful to see you today.  I am glad that you are doing well, I hope you have a wonderful holiday.   Make sure that you try to continue walking as you are, you can always walk in larger stores when it is too cold outside.  I also encourage you to eat small frequent meals or make sure that you are not having a large period of time or you have not eaten anything.  Make sure that you are getting plenty of fruits and vegetables in your diet.  Please follow-up with me in approximately 3 months or sooner if needed.

## 2020-05-19 NOTE — Assessment & Plan Note (Deleted)
Recent steroid injection via his podiatrist provided relief

## 2020-05-19 NOTE — Assessment & Plan Note (Signed)
Well-controlled at home.  Will continue enalapril and Norvasc as is.

## 2020-05-19 NOTE — Assessment & Plan Note (Signed)
Discussed uric acid at goal, will continue current dose of allopurinol for prevention.

## 2020-05-19 NOTE — Assessment & Plan Note (Signed)
Crestor for secondary prevention (previous CVA).  Will recheck lipid panel on next visit.

## 2020-05-25 ENCOUNTER — Other Ambulatory Visit: Payer: Self-pay | Admitting: Family Medicine

## 2020-05-25 DIAGNOSIS — E1122 Type 2 diabetes mellitus with diabetic chronic kidney disease: Secondary | ICD-10-CM

## 2020-05-25 DIAGNOSIS — N183 Chronic kidney disease, stage 3 unspecified: Secondary | ICD-10-CM

## 2020-05-30 DIAGNOSIS — H401131 Primary open-angle glaucoma, bilateral, mild stage: Secondary | ICD-10-CM | POA: Diagnosis not present

## 2020-05-30 MED ORDER — TRUE METRIX BLOOD GLUCOSE TEST VI STRP: ORAL_STRIP | 2 refills | Status: DC

## 2020-05-30 NOTE — Addendum Note (Signed)
Addended by: Dorna Bloom on: 05/30/2020 02:19 PM   Modules accepted: Orders

## 2020-05-30 NOTE — Telephone Encounter (Signed)
Patient calls nurse line reporting Mcarthur Rossetti will not send him #200 test strips that were ordered on 11/17. Patient reports the instructions need to say patient tests 4-5x daily in order for insurance to cover. Will resend prescription with new instructions.

## 2020-06-13 ENCOUNTER — Other Ambulatory Visit: Payer: Self-pay | Admitting: Family Medicine

## 2020-06-13 DIAGNOSIS — M1990 Unspecified osteoarthritis, unspecified site: Secondary | ICD-10-CM

## 2020-07-04 ENCOUNTER — Other Ambulatory Visit: Payer: Self-pay

## 2020-07-04 ENCOUNTER — Ambulatory Visit: Payer: Medicare HMO

## 2020-07-04 VITALS — BP 142/64

## 2020-07-04 DIAGNOSIS — I10 Essential (primary) hypertension: Secondary | ICD-10-CM

## 2020-07-04 NOTE — Progress Notes (Signed)
Patient walks into to clinic to have BP checked.   Patient reports his home monitor has been giving him "all over the place readings."  Patient is taking Enalapril and Norvasc as prescribed.  No symptoms to report, he is just wants to make sure his monitor is working properly.   Manual BP checked 142/64.  Patient checked with his home monitor 179/89.  Patient reports his current monitor is over 64 years old.   Patient advised to purchase a new one.

## 2020-07-05 ENCOUNTER — Ambulatory Visit: Payer: Medicare HMO

## 2020-08-05 ENCOUNTER — Ambulatory Visit: Payer: Medicare HMO | Admitting: Podiatry

## 2020-08-10 ENCOUNTER — Other Ambulatory Visit: Payer: Self-pay

## 2020-08-10 ENCOUNTER — Ambulatory Visit: Payer: Medicare HMO | Admitting: Podiatry

## 2020-08-10 DIAGNOSIS — S90122A Contusion of left lesser toe(s) without damage to nail, initial encounter: Secondary | ICD-10-CM

## 2020-08-10 DIAGNOSIS — M7752 Other enthesopathy of left foot: Secondary | ICD-10-CM

## 2020-08-10 MED ORDER — MELOXICAM 15 MG PO TABS: 15.0000 mg | ORAL_TABLET | Freq: Every day | ORAL | 3 refills | Status: DC

## 2020-08-11 ENCOUNTER — Other Ambulatory Visit: Payer: Self-pay | Admitting: Family Medicine

## 2020-08-11 ENCOUNTER — Encounter: Payer: Self-pay | Admitting: Podiatry

## 2020-08-11 DIAGNOSIS — E1122 Type 2 diabetes mellitus with diabetic chronic kidney disease: Secondary | ICD-10-CM

## 2020-08-11 NOTE — Progress Notes (Signed)
Subjective:  Patient ID: Andrew [name], male    DOB: Sep 22, 1955,  MRN: [id number]  Chief Complaint  Patient presents with  . Toe Pain    PT STATES  THAT HE HAS LEFT TOE PAIN IN HIS SECOND TOE AFTER HAVING A PREVIOUS BROKEN TOE HE STILL HAS PAIN WITH THE WEATHER- PT STATES THAT HIS BIG TOE HURTS AS WELL AND THAT HE WAS PREVIOUSLY DIAGNOSED WITH GOUT AND WOULD LIKE AN INJECTION IN HIS TOE.     65 y.o. male presents with the above complaint.  Patient presents with a follow-up of left first metatarsophalangeal joint pain.  Patient states the injection helps a lot.  He is here for his another injection as it does help him get better.  He also has secondary complaint of left second digit very mild pain.  He would like to know if he can take any kind of medication for it.  He denies any other acute complaints.   Review of Systems: Negative except as noted in the HPI. Denies N/V/F/Ch.  Past Medical History:  Diagnosis Date  . Arthritis   . Chronic left shoulder pain 09/20/2017  . CKD (chronic kidney disease) stage 3, GFR 30-59 ml/min (HCC) 09/05/2006  . CRD (chronic renal disease), stage II    Baseline Cr 1.2  . CVA (cerebral vascular accident) ([place])    x4 last one in 2007, R sided residual weakness  . HAMMER TOE 07/25/2010  . HLD (hyperlipidemia)   . HTN (hypertension)   . Hypothyroidism   . Leg swelling   . Lipoma of back 03/18/2011  . Personal history of colonic adenomas 04/01/2013  . Prostatitis    hx of x2  . PSEUDOFOLLICULITIS BARBAE 09/07/9516   Qualifier: Diagnosis of  By: Danise Mina  MD, Garlon Hatchet    . Sarcoidosis   . 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 T2DM (type 2 diabetes mellitus) ([place])     Current Outpatient Medications:  .  meloxicam (MOBIC) 15 MG tablet, Take 1 tablet (15 mg total) by mouth daily., Disp: 30 tablet, Rfl: 3 .  Acetaminophen (TYLENOL ARTHRITIS EXT RELIEF PO), Take 650 mg by mouth 1 day or 1 dose., Disp: , Rfl:  .  allopurinol (ZYLOPRIM) 100 MG  tablet, Take 2 tablets (200 mg total) by mouth daily., Disp: 180 tablet, Rfl: 3 .  amLODipine (NORVASC) 10 MG tablet, TAKE 1 TABLET EVERY DAY, Disp: 90 tablet, Rfl: 2 .  aspirin EC 81 MG tablet, Take 1 tablet (81 mg total) by mouth daily., Disp: 90 tablet, Rfl: 2 .  betamethasone dipropionate 0.05 % cream, APPLY TO AFFECTED AREA TWICE A DAY, Disp: 30 g, Rfl: 0 .  Blood Glucose Monitoring Suppl (TRUE METRIX AIR GLUCOSE METER) w/Device KIT, 1 each by Does not apply route 2 (two) times daily. Use to test blood sugar two times a day.  DX code: E11.9, Disp: 1 kit, Rfl: 0 .  cholecalciferol (VITAMIN D) 25 MCG (1000 UNIT) tablet, TAKE 1 TABLET EVERY DAY, Disp: 90 tablet, Rfl: 3 .  Continuous Blood Gluc Sensor (DEXCOM G6 SENSOR) MISC, Inject 1 applicator into the skin as directed. Change sensor every 10 days., Disp: 3 each, Rfl: 11 .  Continuous Blood Gluc Transmit (DEXCOM G6 TRANSMITTER) MISC, Inject 1 Device into the skin as directed. Reuse 8 times with sensor changes., Disp: 1 each, Rfl: 3 .  enalapril (VASOTEC) 20 MG tablet, Take 2 tablets (40 mg total) by mouth daily., Disp: 180 tablet, Rfl: 3 .  Ginger,  Zingiber officinalis, (GINGER PO), Take 1 each by mouth daily. Boils fresh ginger and drinks as tea., Disp: , Rfl:  .  glucose blood (TRUE METRIX BLOOD GLUCOSE TEST) test strip, Use to check blood sugar up to 5 times daily.  DX Code E11.9, Disp: 200 strip, Rfl: 2 .  latanoprost (XALATAN) 0.005 % ophthalmic solution, Place 1 drop into both eyes at bedtime., Disp: , Rfl:  .  levothyroxine (SYNTHROID) 75 MCG tablet, TAKE 1 TABLET EVERY DAY  BEFORE  BREAKFAST, Disp: 90 tablet, Rfl: 3 .  metFORMIN (GLUCOPHAGE-XR) 500 MG 24 hr tablet, Take 2 tablets (1,000 mg total) by mouth 2 (two) times daily., Disp: 360 tablet, Rfl: 2 .  naproxen (NAPROSYN) 500 MG tablet, TAKE 1 TABLET EVERY DAY AS NEEDED FOR MODERATE PAIN, Disp: 30 tablet, Rfl: 0 .  rosuvastatin (CRESTOR) 20 MG tablet, TAKE 1 TABLET (20 MG TOTAL) BY MOUTH  DAILY., Disp: 90 tablet, Rfl: 3 .  tamsulosin (FLOMAX) 0.4 MG CAPS capsule, TAKE 2 CAPSULES EVERY DAY, Disp: 180 capsule, Rfl: 3 .  Turmeric POWD, Take 5 mLs by mouth daily. Mixes 1 teaspoon in water every day and drinks it. Uses for arthritis., Disp: , Rfl:   Social History   Tobacco Use  Smoking Status Never Smoker  Smokeless Tobacco Never Used  Tobacco Comment   Never smoker; no plans to start    Allergies  Allergen Reactions  . Metoprolol Succinate Other (See Comments)    Bradycardia (HR to 42)  . Penicillins Itching    Has patient had a PCN reaction causing immediate rash, facial/tongue/throat swelling, SOB or lightheadedness with hypotension: No Has patient had a PCN reaction causing severe rash involving mucus membranes or skin necrosis: No Has patient had a PCN reaction that required hospitalization No Has patient had a PCN reaction occurring within the last 10 years: No If all of the above answers are "NO", then may proceed with Cephalosporin use.  . Empagliflozin Other (See Comments)    Dizzy , Dysphoria,  Patient preference to avoid use.    Objective:  There were no vitals filed for this visit. There is no height or weight on file to calculate BMI. Constitutional Well developed. Well nourished.  Vascular Dorsalis pedis pulses palpable bilaterally. Posterior tibial pulses palpable bilaterally. Capillary refill normal to all digits.  No cyanosis or clubbing noted. Pedal hair growth normal.  Neurologic Normal speech. Oriented to person, place, and time. Epicritic sensation to light touch grossly present bilaterally.  Dermatologic Nails well groomed and normal in appearance. No open wounds. No skin lesions.  Orthopedic:  Pain on palpation to the left first metatarsophalangeal joint.  Pain with range of motion of the first MPJ.  Mild intra-articular pain noted.  Pain on palpation of the right first metatarsophalangeal joint mild pain on palpation to the joint. Very  mild pain on palpation to the left second toe at the level of the DIPJ joint.  No pain with range of motion of either joints.   Radiographs: 3 views of skeletally mature adult bilateral foot: Osteoarthritic changes noted to the midfoot bilaterally.  Osteoarthrosis noted to the first metatarsophalangeal joint bilaterally with left greater than right side.  Generalized osteopenia noted.  No other bony abnormalities noted.  Severe pes planus foot structure noted as well. Assessment:   1. Contusion of second toe of left foot, initial encounter   2. Capsulitis of metatarsophalangeal (MTP) joint of left foot    Plan:  Patient was evaluated and treated and  all questions answered.  Left first metatarsophalangeal joint capsulitis with underlying osteoarthritis left greater than right -I explained to the patient the etiology of osteoarthritis and various treatment options were discussed.  I believe patient will benefit from a steroid injection given that this is worse than right side.  This will help decrease the acute inflammatory component associated with pain.  Patient agrees with the plan would like to proceed with a steroid injection.  I will hold off on a steroid injection to the right side as it is not acutely painful. -A second steroid injection was performed at left first metatarsophalangeal joint using 1% plain Lidocaine and 10 mg of Kenalog. This was well tolerated.  Left second digit mild contusion -I explained to the patient the etiology of mild contusion various treatment options were discussed.  Given that this is very mild in nature without any acute pain I discussed with him that he can just benefit from anti-inflammatory.  Mobic was dispensed to help with mild pain that he is having to the second digit.  Patient agrees with the plan.  At this time I am not concerned for any kind of fracture as he has not had any kind of trauma to that toe.   No follow-ups on file.

## 2020-08-19 ENCOUNTER — Other Ambulatory Visit: Payer: Self-pay

## 2020-08-19 ENCOUNTER — Ambulatory Visit (INDEPENDENT_AMBULATORY_CARE_PROVIDER_SITE_OTHER): Payer: Medicare HMO | Admitting: Family Medicine

## 2020-08-19 ENCOUNTER — Encounter: Payer: Self-pay | Admitting: Family Medicine

## 2020-08-19 VITALS — BP 124/72 | HR 64 | Wt 222.0 lb

## 2020-08-19 DIAGNOSIS — E785 Hyperlipidemia, unspecified: Secondary | ICD-10-CM | POA: Diagnosis not present

## 2020-08-19 DIAGNOSIS — I10 Essential (primary) hypertension: Secondary | ICD-10-CM

## 2020-08-19 DIAGNOSIS — E119 Type 2 diabetes mellitus without complications: Secondary | ICD-10-CM | POA: Diagnosis not present

## 2020-08-19 DIAGNOSIS — E039 Hypothyroidism, unspecified: Secondary | ICD-10-CM

## 2020-08-19 DIAGNOSIS — E1169 Type 2 diabetes mellitus with other specified complication: Secondary | ICD-10-CM | POA: Diagnosis not present

## 2020-08-19 LAB — POCT GLYCOSYLATED HEMOGLOBIN (HGB A1C): Hemoglobin A1C: 7.9 % — AB (ref 4.0–5.6)

## 2020-08-19 MED ORDER — SITAGLIPTIN PHOSPHATE 50 MG PO TABS: 50.0000 mg | ORAL_TABLET | Freq: Every day | ORAL | 1 refills | Status: DC

## 2020-08-19 NOTE — Progress Notes (Signed)
    SUBJECTIVE:   CHIEF COMPLAINT / HPI: Hypertension/diabetes follow-up  [name] is a 65 year old gentleman presenting to discuss the following:  Type 2 diabetes: Last A1c 7.5.  On Metformin only per patient preference.  During last visit in 05/2020 he was adamant on working towards lifestyle changes to improve his A1c. CBGs ranging on average around 120-190, few to max of 240.  He has not been able to exercise as he had wanted to given how cold it is been outside.  As it is getting warmer, he is starting to walk outside approximately 1 mile on days it is warm.  He is trying to be mindful with his diet, cut out a lot of added sugars in his coffee/tea.  Reports he feels like his sugar occasionally will get low in the middle of the night and he has to wake up to drink apple juice.  He usually has a fruit cocktail for dessert after dinner.   Hypertension: Compliant with enalapril and Norvasc daily.  SBP has been around 120-130 at home.  PERTINENT  PMH / PSH:  Hypertension, type 2 diabetes, hyperlipidemia, osteoarthritis, chronic gout  OBJECTIVE:   BP 124/72   Pulse 64   Wt 222 lb (100.7 kg)   SpO2 98%   BMI 29.29 kg/m   General: Alert, NAD HEENT: NCAT, MMM Cardiac: RRR  Lungs: Clear bilaterally, no increased WOB  Msk: Moves all extremities spontaneously  Ext: Warm, dry, 2+ distal pulses, no edema   ASSESSMENT/PLAN:   Type 2 diabetes mellitus without complications (HCC) Borderline controlled, A1c 7.9 today, up from 7.5.  Goal <7.0.  Encouraged continuing his increased physical activity and being mindful on simple carbs/sugars prior to bedtime to avoid nocturnal hypoglycemia.  Rx'd Januvia 50 mg, to increase to 100 daily if tolerating.  He previously has tried a GLP-1/SGLT2 inhibitor with undesirable side effects.  Continue Metformin as is.  Hyperlipidemia associated with type 2 diabetes mellitus Recheck lipid panel.  Hypothyroidism Recheck TSH.  Currently tolerating Synthroid 75  mcg well.  Essential hypertension Well-controlled on enalapril and Norvasc daily.  Consider repeat BMP in the next few months.    Follow-up in approximately 1 month due to new medication start or sooner if any difficulty.  Counseled on common side effects including s/sx of pancreatitis and severe joint pains.  Andrew [name], [place]

## 2020-08-19 NOTE — Patient Instructions (Addendum)
  Diet Recommendations for Diabetes  Carbohydrate includes starch, sugar, and fiber.  Of these, only sugar and starch raise blood glucose.  (Fiber is found in fruits, vegetables [especially skin, seeds, and stalks], whole grains, and beans.)   Starchy (carb) foods: Bread, rice, pasta, potatoes, corn, cereal, grits, crackers, bagels, muffins, all baked goods.  (Fruit, milk, and yogurt also have carbohydrate, but most of these foods will not spike your blood sugar as most starchy or sweet foods will.)  A few fruits do cause high blood sugars; use small portions of bananas (limit to 1/2 at a time), grapes, watermelon, and oranges.   Protein foods: Meat, fish, poultry, eggs, dairy foods, and beans such as pinto and kidney beans (beans also provide carbohydrate).   1. Eat at least 3 REAL meals and 1-2 snacks per day. Eat breakfast within the first hour of getting up.  Have something to eat at least every 5 hours while awake.   2. Limit starchy foods to TWO per meal and ONE per snack. ONE portion of a starchy food is equal to the following:   - ONE slice of bread (or its equivalent, such as half of a hamburger bun).   - 1/2 cup of a "scoopable" starchy food such as potatoes or rice.   - 15 grams of Total Carbohydrate as shown on food label.   - Every 4 ounces of a sweet drink (including fruit juice). 3. Include at every lunch and dinner: a protein food, a carb food, and vegetables.   - An ideal lunch or dinner includes twice the volume of veg's as protein or carbohydrate foods.  - Fresh or frozen vegetables are best.   - Keep frozen vegetables on hand for a quick option.      It was wonderful to see you.  I encourage you to keep working towards your diet and increasing your walking when it is warm outside.  We are can start a medication called Januvia 100 mg daily, you can take it at any time when you have a preference.  Please if you have any significant abdominal pain, nausea, vomiting, increased joint  pains please let me know and we can discuss if this should be continued or not.

## 2020-08-19 NOTE — Assessment & Plan Note (Signed)
Recheck TSH.  Currently tolerating Synthroid 75 mcg well.

## 2020-08-19 NOTE — Assessment & Plan Note (Addendum)
Well-controlled on enalapril and Norvasc daily.  Consider repeat BMP in the next few months.

## 2020-08-19 NOTE — Assessment & Plan Note (Signed)
Recheck lipid panel 

## 2020-08-19 NOTE — Assessment & Plan Note (Signed)
Borderline controlled, A1c 7.9 today, up from 7.5.  Goal <7.0.  Encouraged continuing his increased physical activity and being mindful on simple carbs/sugars prior to bedtime to avoid nocturnal hypoglycemia.  Rx'd Januvia 50 mg, to increase to 100 daily if tolerating.  He previously has tried a GLP-1/SGLT2 inhibitor with undesirable side effects.  Continue Metformin as is.

## 2020-08-20 LAB — LIPID PANEL
Chol/HDL Ratio: 2 ratio (ref 0.0–5.0)
Cholesterol, Total: 86 mg/dL — ABNORMAL LOW (ref 100–199)
HDL: 42 mg/dL (ref >39)
LDL Chol Calc (NIH): 26 mg/dL (ref 0–99)
Triglycerides: 95 mg/dL (ref 0–149)
VLDL Cholesterol Cal: 18 mg/dL (ref 5–40)

## 2020-08-20 LAB — TSH: TSH: 1.21 u[IU]/mL (ref 0.450–4.500)

## 2020-08-26 ENCOUNTER — Telehealth: Payer: Self-pay

## 2020-08-26 NOTE — Telephone Encounter (Signed)
Patient calls nurse line regarding Januvia rx. Patient reports that it will be more cost effective if he can have rx sent to [facility]. Called and canceled refill at [facility]. Patient picked up rx on 2/11, therefore, new rx cannot be sent to [facility] until refill is due.   Advised patient to call back at the beginning of March to have additional refill sent to [facility].   Talbot Grumbling, RN

## 2020-08-29 ENCOUNTER — Other Ambulatory Visit: Payer: Self-pay | Admitting: *Deleted

## 2020-08-29 DIAGNOSIS — E119 Type 2 diabetes mellitus without complications: Secondary | ICD-10-CM

## 2020-08-29 MED ORDER — SITAGLIPTIN PHOSPHATE 100 MG PO TABS: 100.0000 mg | ORAL_TABLET | Freq: Every day | ORAL | 1 refills | Status: DC

## 2020-08-29 NOTE — Telephone Encounter (Signed)
Rx sent for 100mg  as this will be to mail pharmacy and he is to increase to 100mg  in 1 week after starting 50mg .

## 2020-09-03 ENCOUNTER — Other Ambulatory Visit: Payer: Self-pay | Admitting: Family Medicine

## 2020-09-05 DIAGNOSIS — H401131 Primary open-angle glaucoma, bilateral, mild stage: Secondary | ICD-10-CM | POA: Diagnosis not present

## 2020-09-16 ENCOUNTER — Ambulatory Visit: Payer: Medicare HMO | Admitting: Family Medicine

## 2020-09-16 NOTE — Progress Notes (Deleted)
    SUBJECTIVE:   CHIEF COMPLAINT / HPI: Follow-up medication  Andrew [name] is a 65 year old gentleman presenting for follow-up of new medication start.  T2DM: He was started on Januvia 100 mg daily last month in addition to Metformin.  PERTINENT  PMH / PSH: Hypertension, type 2 diabetes, hyperlipidemia, osteoarthritis, chronic gout  OBJECTIVE:   There were no vitals taken for this visit.  ***  ASSESSMENT/PLAN:   No problem-specific Assessment & Plan notes found for this encounter.     [name], [place]   {    This will disappear when note is signed, click to select method of visit    :1}

## 2020-10-03 ENCOUNTER — Other Ambulatory Visit: Payer: Self-pay | Admitting: Family Medicine

## 2020-10-07 ENCOUNTER — Other Ambulatory Visit: Payer: Self-pay | Admitting: Family Medicine

## 2020-10-07 DIAGNOSIS — M1990 Unspecified osteoarthritis, unspecified site: Secondary | ICD-10-CM

## 2020-10-10 ENCOUNTER — Ambulatory Visit (INDEPENDENT_AMBULATORY_CARE_PROVIDER_SITE_OTHER): Payer: Medicare HMO | Admitting: Family Medicine

## 2020-10-10 ENCOUNTER — Encounter: Payer: Self-pay | Admitting: Family Medicine

## 2020-10-10 ENCOUNTER — Other Ambulatory Visit: Payer: Self-pay

## 2020-10-10 VITALS — BP 118/70 | HR 78 | Ht 73.0 in | Wt 221.0 lb

## 2020-10-10 DIAGNOSIS — I10 Essential (primary) hypertension: Secondary | ICD-10-CM | POA: Diagnosis not present

## 2020-10-10 DIAGNOSIS — E119 Type 2 diabetes mellitus without complications: Secondary | ICD-10-CM

## 2020-10-10 DIAGNOSIS — M1990 Unspecified osteoarthritis, unspecified site: Secondary | ICD-10-CM | POA: Diagnosis not present

## 2020-10-10 DIAGNOSIS — E785 Hyperlipidemia, unspecified: Secondary | ICD-10-CM

## 2020-10-10 DIAGNOSIS — E1169 Type 2 diabetes mellitus with other specified complication: Secondary | ICD-10-CM | POA: Diagnosis not present

## 2020-10-10 MED ORDER — SITAGLIPTIN PHOSPHATE 100 MG PO TABS: 100.0000 mg | ORAL_TABLET | Freq: Every day | ORAL | 2 refills | Status: DC

## 2020-10-10 NOTE — Assessment & Plan Note (Signed)
A1c 7.9 in 08/2020, anticipate improvement on next check in 11/2020 after addition of Januvia and increase physical activity.  Will continue Metformin and Januvia as is.

## 2020-10-10 NOTE — Assessment & Plan Note (Addendum)
Continue Crestor for secondary prevention (history of CVA).

## 2020-10-10 NOTE — Assessment & Plan Note (Signed)
Well-controlled on enalapril and Norvasc daily.  Check BMP today.

## 2020-10-10 NOTE — Progress Notes (Signed)
    SUBJECTIVE:   CHIEF COMPLAINT / HPI: DM f/u   Andrew [name] is a 65 year old gentleman presenting for diabetic follow-up.  He was last seen in 08/2020 for diabetic follow-up, started on Januvia in addition to his Metformin after several month trial of diet/exercise changes.  Last A1c 7.9 in 08/2020.  He had previously tried a GLP-1 and SGLT2 inhibitor and did not tolerate either.  He has not been interested in trying any further injectables.  Today, he reports he is doing very well.  Tolerating the Januvia without concern and request 90-day refill.  Glucose has been ranging 120-130s.  He is continue to follow a balanced diet and stay active, becoming more active now that the weather is nice.  PERTINENT  PMH / PSH: Hypertension, type 2 diabetes, hyperlipidemia, osteoarthritis, chronic gout, hypothyroidism  OBJECTIVE:   BP 118/70   Pulse 78   Ht 6\' 1"  (1.854 m)   Wt 221 lb (100.2 kg)   SpO2 96%   BMI 29.16 kg/m   General: Alert, NAD HEENT: NCAT, MMM Cardiac: RRR no m/g/r Lungs: Clear bilaterally, no increased WOB  Msk: Moves all extremities spontaneously, normal gait  Ext: Warm, dry, 2+ radial pulses   ASSESSMENT/PLAN:   Type 2 diabetes mellitus without complications (HCC) O9G 7.9 in 08/2020, anticipate improvement on next check in 11/2020 after addition of Januvia and increase physical activity.  Will continue Metformin and Januvia as is.   Hyperlipidemia associated with type 2 diabetes mellitus Continue Crestor for secondary prevention (history of CVA).  Essential hypertension Well-controlled on enalapril and Norvasc daily.  Check BMP today.  Arthritis He has both naproxen and meloxicam in his medication list, uses sparingly as needed.  He always uses Tylenol first and understands not to use naproxen/meloxicam at the same time.  Checking BMP today.    Follow-up in 1 month for A1c, or sooner if needed.  [name], [place]

## 2020-10-10 NOTE — Patient Instructions (Signed)
Great to see you! Glad you are doing well with the new medication.   Keep up the good work!

## 2020-10-10 NOTE — Assessment & Plan Note (Signed)
He has both naproxen and meloxicam in his medication list, uses sparingly as needed.  He always uses Tylenol first and understands not to use naproxen/meloxicam at the same time.  Checking BMP today.

## 2020-10-11 LAB — BASIC METABOLIC PANEL
BUN/Creatinine Ratio: 9 — ABNORMAL LOW (ref 10–24)
BUN: 13 mg/dL (ref 8–27)
CO2: 22 mmol/L (ref 20–29)
Calcium: 9.2 mg/dL (ref 8.6–10.2)
Chloride: 94 mmol/L — ABNORMAL LOW (ref 96–106)
Creatinine, Ser: 1.39 mg/dL — ABNORMAL HIGH (ref 0.76–1.27)
Glucose: 157 mg/dL — ABNORMAL HIGH (ref 65–99)
Potassium: 4.4 mmol/L (ref 3.5–5.2)
Sodium: 129 mmol/L — ABNORMAL LOW (ref 134–144)
eGFR: 57 mL/min/{1.73_m2} — ABNORMAL LOW (ref >59)

## 2020-10-12 ENCOUNTER — Ambulatory Visit (INDEPENDENT_AMBULATORY_CARE_PROVIDER_SITE_OTHER): Payer: Medicare HMO

## 2020-10-12 ENCOUNTER — Other Ambulatory Visit: Payer: Self-pay

## 2020-10-12 DIAGNOSIS — Z23 Encounter for immunization: Secondary | ICD-10-CM | POA: Diagnosis not present

## 2020-10-13 ENCOUNTER — Other Ambulatory Visit: Payer: Self-pay | Admitting: Family Medicine

## 2020-10-13 DIAGNOSIS — E871 Hypo-osmolality and hyponatremia: Secondary | ICD-10-CM

## 2020-10-17 ENCOUNTER — Other Ambulatory Visit: Payer: Medicare HMO

## 2020-10-17 ENCOUNTER — Other Ambulatory Visit: Payer: Self-pay

## 2020-10-17 DIAGNOSIS — E871 Hypo-osmolality and hyponatremia: Secondary | ICD-10-CM | POA: Diagnosis not present

## 2020-10-18 LAB — BASIC METABOLIC PANEL
BUN/Creatinine Ratio: 9 — ABNORMAL LOW (ref 10–24)
BUN: 13 mg/dL (ref 8–27)
CO2: 22 mmol/L (ref 20–29)
Calcium: 9.3 mg/dL (ref 8.6–10.2)
Chloride: 91 mmol/L — ABNORMAL LOW (ref 96–106)
Creatinine, Ser: 1.47 mg/dL — ABNORMAL HIGH (ref 0.76–1.27)
Glucose: 158 mg/dL — ABNORMAL HIGH (ref 65–99)
Potassium: 4.3 mmol/L (ref 3.5–5.2)
Sodium: 129 mmol/L — ABNORMAL LOW (ref 134–144)
eGFR: 53 mL/min/{1.73_m2} — ABNORMAL LOW (ref >59)

## 2020-10-18 LAB — SODIUM, URINE, RANDOM: Sodium, Ur: <20 mmol/L

## 2020-10-18 LAB — OSMOLALITY, URINE: Osmolality, Ur: 294 mOsmol/kg

## 2020-10-25 ENCOUNTER — Other Ambulatory Visit: Payer: Self-pay

## 2020-11-11 ENCOUNTER — Ambulatory Visit: Payer: Medicare HMO | Admitting: Podiatry

## 2020-11-11 ENCOUNTER — Other Ambulatory Visit: Payer: Self-pay

## 2020-11-11 ENCOUNTER — Encounter: Payer: Self-pay | Admitting: Podiatry

## 2020-11-11 DIAGNOSIS — M7752 Other enthesopathy of left foot: Secondary | ICD-10-CM

## 2020-11-15 ENCOUNTER — Encounter: Payer: Self-pay | Admitting: Podiatry

## 2020-11-15 MED ORDER — TRAMADOL HCL 50 MG PO TABS: 50.0000 mg | ORAL_TABLET | Freq: Three times a day (TID) | ORAL | 0 refills | Status: AC | PRN

## 2020-11-15 NOTE — Progress Notes (Signed)
Subjective:  Patient ID: Andrew [name], male    DOB: 1956-05-10,  MRN: [id number]  Chief Complaint  Patient presents with  . Debridement    Trim toenails  . Foot Pain    Follow up capsulitis 1st MPJ left   "Still hurting, but the injection he gave me increased my kidney levels"  . Toe Pain    Follow up contusion toe 2nd left   "Still sore, but my kidney doc said not to take that medicine"    65 y.o. male presents with the above complaint.  Patient presents with a follow-up of left first metatarsophalangeal joint pain.  Patient states the injection helps a lot.  He is here for his another injection as it does help him get better.  He does not have any secondary complaint today.  He would like to know if he can get some tramadol to help with the pain occasionally.   Review of Systems: Negative except as noted in the HPI. Denies N/V/F/Ch.  Past Medical History:  Diagnosis Date  . Arthritis   . Chronic left shoulder pain 09/20/2017  . CKD (chronic kidney disease) stage 3, GFR 30-59 ml/min (HCC) 09/05/2006  . CRD (chronic renal disease), stage II    Baseline Cr 1.2  . CVA (cerebral vascular accident) ([place])    x4 last one in 2007, R sided residual weakness  . DSORD, ADJST W/MIXED ANXIETY/DEPRESSED MOOD 10/18/2006   Qualifier: Diagnosis of  By: Dalbert Mayotte    . ERECTILE DYSFUNCTION 10/18/2006   Qualifier: Diagnosis of  By: Dalbert Mayotte    . HAMMER TOE 07/25/2010  . HLD (hyperlipidemia)   . HTN (hypertension)   . Hypothyroidism   . Leg swelling   . Lipoma of back 03/18/2011  . Personal history of colonic adenomas 04/01/2013  . Primary osteoarthritis of right knee 05/21/2018  . Prostatitis    hx of x2  . PSEUDOFOLLICULITIS BARBAE 08/11/9530   Qualifier: Diagnosis of  By: Danise Mina  MD, Garlon Hatchet    . Sarcoidosis   . 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 T2DM (type 2 diabetes mellitus) ([place])     Current Outpatient Medications:  .  traMADol (ULTRAM) 50 MG tablet,  Take 1 tablet (50 mg total) by mouth every 8 (eight) hours as needed for up to 5 days., Disp: 15 tablet, Rfl: 0 .  Acetaminophen (TYLENOL ARTHRITIS EXT RELIEF PO), Take 650 mg by mouth 1 day or 1 dose., Disp: , Rfl:  .  allopurinol (ZYLOPRIM) 100 MG tablet, Take 2 tablets (200 mg total) by mouth daily., Disp: 180 tablet, Rfl: 3 .  amLODipine (NORVASC) 10 MG tablet, TAKE 1 TABLET EVERY DAY, Disp: 90 tablet, Rfl: 2 .  aspirin EC 81 MG tablet, Take 1 tablet (81 mg total) by mouth daily., Disp: 90 tablet, Rfl: 2 .  betamethasone dipropionate 0.05 % cream, APPLY TO AFFECTED AREA TWICE A DAY, Disp: 30 g, Rfl: 0 .  Blood Glucose Monitoring Suppl (TRUE METRIX AIR GLUCOSE METER) w/Device KIT, 1 each by Does not apply route 2 (two) times daily. Use to test blood sugar two times a day.  DX code: E11.9, Disp: 1 kit, Rfl: 0 .  cholecalciferol (VITAMIN D) 25 MCG (1000 UNIT) tablet, TAKE 1 TABLET EVERY DAY, Disp: 90 tablet, Rfl: 3 .  Continuous Blood Gluc Sensor (DEXCOM G6 SENSOR) MISC, Inject 1 applicator into the skin as directed. Change sensor every 10 days., Disp: 3 each, Rfl: 11 .  Continuous Blood Gluc Transmit (  DEXCOM G6 TRANSMITTER) MISC, Inject 1 Device into the skin as directed. Reuse 8 times with sensor changes., Disp: 1 each, Rfl: 3 .  enalapril (VASOTEC) 20 MG tablet, Take 2 tablets (40 mg total) by mouth daily., Disp: 180 tablet, Rfl: 3 .  Ginger, Zingiber officinalis, (GINGER PO), Take 1 each by mouth daily. Boils fresh ginger and drinks as tea., Disp: , Rfl:  .  glucose blood (TRUE METRIX BLOOD GLUCOSE TEST) test strip, TEST  5  TIMES  DAILY, Disp: 420 strip, Rfl: 1 .  latanoprost (XALATAN) 0.005 % ophthalmic solution, Place 1 drop into both eyes at bedtime., Disp: , Rfl:  .  levothyroxine (SYNTHROID) 75 MCG tablet, TAKE 1 TABLET EVERY DAY  BEFORE  BREAKFAST, Disp: 90 tablet, Rfl: 3 .  meloxicam (MOBIC) 15 MG tablet, Take 1 tablet (15 mg total) by mouth daily., Disp: 30 tablet, Rfl: 3 .  metFORMIN  (GLUCOPHAGE-XR) 500 MG 24 hr tablet, Take 2 tablets (1,000 mg total) by mouth 2 (two) times daily., Disp: 360 tablet, Rfl: 2 .  naproxen (NAPROSYN) 500 MG tablet, TAKE 1 TABLET EVERY DAY AS NEEDED FOR MODERATE PAIN, Disp: 30 tablet, Rfl: 0 .  rosuvastatin (CRESTOR) 20 MG tablet, TAKE 1 TABLET (20 MG TOTAL) BY MOUTH DAILY., Disp: 90 tablet, Rfl: 3 .  sitaGLIPtin (JANUVIA) 100 MG tablet, Take 1 tablet (100 mg total) by mouth daily., Disp: 90 tablet, Rfl: 2 .  tamsulosin (FLOMAX) 0.4 MG CAPS capsule, TAKE 2 CAPSULES EVERY DAY, Disp: 180 capsule, Rfl: 3 .  Turmeric POWD, Take 5 mLs by mouth daily. Mixes 1 teaspoon in water every day and drinks it. Uses for arthritis., Disp: , Rfl:   Social History   Tobacco Use  Smoking Status Never Smoker  Smokeless Tobacco Never Used  Tobacco Comment   Never smoker; no plans to start    Allergies  Allergen Reactions  . Metoprolol Succinate Other (See Comments)    Bradycardia (HR to 42)  . Penicillins Itching    Has patient had a PCN reaction causing immediate rash, facial/tongue/throat swelling, SOB or lightheadedness with hypotension: No Has patient had a PCN reaction causing severe rash involving mucus membranes or skin necrosis: No Has patient had a PCN reaction that required hospitalization No Has patient had a PCN reaction occurring within the last 10 years: No If all of the above answers are "NO", then may proceed with Cephalosporin use.  . Empagliflozin Other (See Comments)    Dizzy , Dysphoria,  Patient preference to avoid use.    Objective:  There were no vitals filed for this visit. There is no height or weight on file to calculate BMI. Constitutional Well developed. Well nourished.  Vascular Dorsalis pedis pulses palpable bilaterally. Posterior tibial pulses palpable bilaterally. Capillary refill normal to all digits.  No cyanosis or clubbing noted. Pedal hair growth normal.  Neurologic Normal speech. Oriented to person, place, and  time. Epicritic sensation to light touch grossly present bilaterally.  Dermatologic Nails well groomed and normal in appearance. No open wounds. No skin lesions.  Orthopedic:  Pain on palpation to the left first metatarsophalangeal joint.  Pain with range of motion of the first MPJ.  Mild intra-articular pain noted.  Pain on palpation of the right first metatarsophalangeal joint mild pain on palpation to the joint. Very mild pain on palpation to the left second toe at the level of the DIPJ joint.  No pain with range of motion of either joints.   Radiographs: 3 views  of skeletally mature adult bilateral foot: Osteoarthritic changes noted to the midfoot bilaterally.  Osteoarthrosis noted to the first metatarsophalangeal joint bilaterally with left greater than right side.  Generalized osteopenia noted.  No other bony abnormalities noted.  Severe pes planus foot structure noted as well. Assessment:   1. Capsulitis of metatarsophalangeal (MTP) joint of left foot    Plan:  Patient was evaluated and treated and all questions answered.  Left first metatarsophalangeal joint capsulitis with underlying osteoarthritis left greater than right -I explained to the patient the etiology of osteoarthritis and various treatment options were discussed.  I believe patient will benefit from a steroid injection given that this is worse than right side.  This will help decrease the acute inflammatory component associated with pain.  Patient agrees with the plan would like to proceed with a steroid injection.  I will hold off on a steroid injection to the right side as it is not acutely painful. -A third steroid injection was performed at left first metatarsophalangeal joint using 1% plain Lidocaine and 10 mg of Kenalog. This was well tolerated. -Tramadol was dispensed for pain control.  Left second digit mild contusion -I explained to the patient the etiology of mild contusion various treatment options were  discussed.  Given that this is very mild in nature without any acute pain I discussed with him that he can just benefit from anti-inflammatory.  Mobic was dispensed to help with mild pain that he is having to the second digit.  Patient agrees with the plan.  At this time I am not concerned for any kind of fracture as he has not had any kind of trauma to that toe.   No follow-ups on file.

## 2020-11-16 ENCOUNTER — Ambulatory Visit (INDEPENDENT_AMBULATORY_CARE_PROVIDER_SITE_OTHER): Payer: Medicare HMO | Admitting: Family Medicine

## 2020-11-16 ENCOUNTER — Encounter: Payer: Self-pay | Admitting: Family Medicine

## 2020-11-16 ENCOUNTER — Other Ambulatory Visit: Payer: Self-pay

## 2020-11-16 VITALS — BP 127/80 | HR 87 | Ht 73.0 in | Wt 219.6 lb

## 2020-11-16 DIAGNOSIS — E1169 Type 2 diabetes mellitus with other specified complication: Secondary | ICD-10-CM | POA: Diagnosis not present

## 2020-11-16 DIAGNOSIS — I10 Essential (primary) hypertension: Secondary | ICD-10-CM | POA: Diagnosis not present

## 2020-11-16 DIAGNOSIS — E785 Hyperlipidemia, unspecified: Secondary | ICD-10-CM

## 2020-11-16 DIAGNOSIS — E119 Type 2 diabetes mellitus without complications: Secondary | ICD-10-CM | POA: Diagnosis not present

## 2020-11-16 DIAGNOSIS — N182 Chronic kidney disease, stage 2 (mild): Secondary | ICD-10-CM | POA: Diagnosis not present

## 2020-11-16 DIAGNOSIS — Z125 Encounter for screening for malignant neoplasm of prostate: Secondary | ICD-10-CM

## 2020-11-16 LAB — POCT GLYCOSYLATED HEMOGLOBIN (HGB A1C): HbA1c, POC (controlled diabetic range): 7.3 % — AB (ref 0.0–7.0)

## 2020-11-16 NOTE — Progress Notes (Signed)
    SUBJECTIVE:   CHIEF COMPLAINT / HPI: T2DM   Andrew [name] is a 65 year old gentleman presenting for follow-up type 2 diabetes.   He reports that he has been doing very well since last seen in 10/2020.  Still tolerating metformin and Januvia without concern.  He states he has been following his well-balanced diet quite closely and adding more physical activity now that it is warmer outside.  He has been using a push lawnmower and walking outside or around [employer] if it is raining.   He would like to recheck his kidney function today after it was elevated on last check in 10/2020.  He has discontinued all NSAIDs including naproxen and meloxicam.   PERTINENT  PMH / PSH: Hypertension, type 2 diabetes, hyperlipidemia, osteoarthritis, chronic gout, hypothyroidism  OBJECTIVE:   BP 127/80   Pulse 87   Ht 6\' 1"  (1.854 m)   Wt 219 lb 9.6 oz (99.6 kg)   SpO2 99%   BMI 28.97 kg/m   General: Alert, NAD HEENT: NCAT, MMM Cardiac: RRR no m/g/r Lungs: Clear bilaterally, no increased WOB  Abdomen: soft Msk: Normal gait  Ext: Warm, dry, 2+ distal pulses  ASSESSMENT/PLAN:   Type 2 diabetes mellitus without complications (HCC) I9S improved, 7.3% today.  Provided encouragement and congratulations on his efforts toward staying with a well-balanced diet and increasing physical activity.  Continue metformin 1000 mg BID and Januvia 100 mg as is.  CKD (chronic kidney disease) Previous history of this however normalized over time.  Most recently noted creatinine coming up slowly on last 2 checks, 1.47 on 4/11.  He discontinued all NSAIDs at that time.  We will recheck BMP today.  Encounter for prostate cancer screening Previous PSA 1.0 in 07/2019.  Discussed risks and benefits associated with PSA screening, opted to proceed.  We will check PSA today.  Hyperlipidemia associated with type 2 diabetes mellitus Continue Crestor for secondary prevention, history of CVA.  Essential  hypertension Well-controlled.  Continue enalapril and Norvasc daily.    Pending labs, follow-up in 3 months or sooner if needed.  [name], [place]

## 2020-11-16 NOTE — Assessment & Plan Note (Signed)
A1c improved, 7.3% today.  Provided encouragement and congratulations on his efforts toward staying with a well-balanced diet and increasing physical activity.  Continue metformin 1000 mg BID and Januvia 100 mg as is.

## 2020-11-16 NOTE — Assessment & Plan Note (Signed)
Previous history of this however normalized over time.  Most recently noted creatinine coming up slowly on last 2 checks, 1.47 on 4/11.  He discontinued all NSAIDs at that time.  We will recheck BMP today.

## 2020-11-16 NOTE — Assessment & Plan Note (Signed)
Well-controlled.  Continue enalapril and Norvasc daily.

## 2020-11-16 NOTE — Assessment & Plan Note (Signed)
Previous PSA 1.0 in 07/2019.  Discussed risks and benefits associated with PSA screening, opted to proceed.  We will check PSA today.

## 2020-11-16 NOTE — Assessment & Plan Note (Signed)
Continue Crestor for secondary prevention, history of CVA.

## 2020-11-16 NOTE — Patient Instructions (Signed)
I will either call or send a mychart message with your results.   Keep up the great work with your diet and exercise, especially as it is getting warm!

## 2020-11-17 LAB — BASIC METABOLIC PANEL
BUN/Creatinine Ratio: 15 (ref 10–24)
BUN: 21 mg/dL (ref 8–27)
CO2: 24 mmol/L (ref 20–29)
Calcium: 9.8 mg/dL (ref 8.6–10.2)
Chloride: 93 mmol/L — ABNORMAL LOW (ref 96–106)
Creatinine, Ser: 1.42 mg/dL — ABNORMAL HIGH (ref 0.76–1.27)
Glucose: 164 mg/dL — ABNORMAL HIGH (ref 65–99)
Potassium: 4.7 mmol/L (ref 3.5–5.2)
Sodium: 130 mmol/L — ABNORMAL LOW (ref 134–144)
eGFR: 55 mL/min/{1.73_m2} — ABNORMAL LOW (ref >59)

## 2020-11-17 LAB — PSA: Prostate Specific Ag, Serum: 1 ng/mL (ref 0.0–4.0)

## 2020-12-06 DIAGNOSIS — H401131 Primary open-angle glaucoma, bilateral, mild stage: Secondary | ICD-10-CM | POA: Diagnosis not present

## 2020-12-12 ENCOUNTER — Other Ambulatory Visit: Payer: Self-pay

## 2020-12-12 DIAGNOSIS — I10 Essential (primary) hypertension: Secondary | ICD-10-CM

## 2020-12-12 MED ORDER — TRUE METRIX LEVEL 2 NORMAL VI SOLN: 3 refills | Status: DC

## 2020-12-12 MED ORDER — METFORMIN HCL ER 500 MG PO TB24: 1000.0000 mg | ORAL_TABLET | Freq: Two times a day (BID) | ORAL | 2 refills | Status: DC

## 2020-12-12 MED ORDER — TRUE METRIX LEVEL 3 HIGH VI SOLN: 3 refills | Status: DC

## 2020-12-12 MED ORDER — TRUEPLUS LANCETS 33G MISC: 12 refills | Status: DC

## 2020-12-12 MED ORDER — ALCOHOL SWABS PADS: MEDICATED_PAD | 3 refills | Status: DC

## 2020-12-12 MED ORDER — AMLODIPINE BESYLATE 10 MG PO TABS: 1.0000 | ORAL_TABLET | Freq: Every day | ORAL | 2 refills | Status: DC

## 2020-12-12 MED ORDER — TRUE METRIX LEVEL 1 LOW VI SOLN
3 refills | Status: DC
Start: 2020-12-12 — End: 2022-12-27

## 2020-12-12 MED ORDER — TRUE METRIX AIR GLUCOSE METER W/DEVICE KIT: 1.0000 | PACK | Freq: Two times a day (BID) | 0 refills | Status: DC

## 2020-12-12 NOTE — Telephone Encounter (Signed)
Patient calls nurse line requesting new glucometer and supplies from [facility]. [facility] and verified which supplies patient was requesting. Sent in True metrix meter and supplies per protocol.   Patient is also requesting refills on amlodipine and metformin. Medications pended to this encounter.    Talbot Grumbling, RN

## 2020-12-22 ENCOUNTER — Other Ambulatory Visit: Payer: Self-pay | Admitting: Family Medicine

## 2020-12-22 ENCOUNTER — Telehealth: Payer: Self-pay

## 2020-12-22 DIAGNOSIS — I10 Essential (primary) hypertension: Secondary | ICD-10-CM

## 2020-12-22 NOTE — Telephone Encounter (Signed)
Patient LVM on nurse line stating that he is stopping his metformin due to low blood sugar levels and feeling fatigue. Patient states that the metformin and [place] "are too much for my body to process."  Attempted to call patient. No answer, left HIPAA compliant VM for patient to return call to office.   Will gather more information about symptoms and blood sugar readings when patient returns call.    Talbot Grumbling, RN

## 2020-12-22 NOTE — Telephone Encounter (Signed)
Spoke with pt and he stated that he takes them 2 hours apart and by lunch time he feels weird. Please advise. Adda Stokes Kennon Holter, CMA

## 2020-12-22 NOTE — Telephone Encounter (Signed)
He can try taking his metformin and Januvia at separate times, as I know in the past this has been helpful for him.  Please also make sure that he is eating frequently and not going long periods without food/water.  If he is having recurrent episodes or any glucose under 70, he should follow-up sooner than scheduled.  [name], DO

## 2020-12-22 NOTE — Telephone Encounter (Signed)
Patient returns call to nurse line. Patient reports that yesterday blood sugars were in the low 100s with lowest at 112. Patient reports that with this reading he felt extremely fatigued with jitteriness. Patient states "feeling out of it" while taking metformin and Janvia together.   Patient reports that this morning he only took [place]. Reports that blood sugars today have been 150's-180's. Patient is currently asymptomatic.   ED precautions given.   Please advise how patient should proceed with medication management.   Talbot Grumbling, RN

## 2020-12-23 NOTE — Telephone Encounter (Signed)
If he is willing to, he can try taking januvia at lunch. If not (or if that makes no difference), it is also fine for him to stop [place] and see if this improves his symptoms. Please have him come in the next 2 weeks or so to see how he is doing.   [name], DO

## 2020-12-30 ENCOUNTER — Other Ambulatory Visit: Payer: Self-pay | Admitting: Family Medicine

## 2020-12-30 DIAGNOSIS — N183 Chronic kidney disease, stage 3 unspecified: Secondary | ICD-10-CM

## 2020-12-30 DIAGNOSIS — E1122 Type 2 diabetes mellitus with diabetic chronic kidney disease: Secondary | ICD-10-CM

## 2021-01-02 NOTE — Telephone Encounter (Signed)
Called to follow up with patient.  He states that he is doing well with the Januvia now.  Ozella Almond, [place]

## 2021-01-04 ENCOUNTER — Other Ambulatory Visit: Payer: Self-pay | Admitting: Family Medicine

## 2021-01-11 ENCOUNTER — Ambulatory Visit: Payer: Medicare HMO | Admitting: Podiatry

## 2021-01-11 ENCOUNTER — Other Ambulatory Visit: Payer: Self-pay | Admitting: Podiatry

## 2021-01-12 NOTE — Telephone Encounter (Signed)
Please advise 

## 2021-01-18 ENCOUNTER — Emergency Department (HOSPITAL_COMMUNITY)
Admission: EM | Admit: 2021-01-18 | Discharge: 2021-01-19 | Discharge status: Against Medical Advice | Payer: Medicare HMO | Attending: Emergency Medicine | Admitting: Emergency Medicine

## 2021-01-18 ENCOUNTER — Other Ambulatory Visit: Payer: Self-pay

## 2021-01-18 ENCOUNTER — Encounter (HOSPITAL_COMMUNITY): Payer: Self-pay | Admitting: Emergency Medicine

## 2021-01-18 DIAGNOSIS — E162 Hypoglycemia, unspecified: Secondary | ICD-10-CM | POA: Diagnosis not present

## 2021-01-18 DIAGNOSIS — Z5321 Procedure and treatment not carried out due to patient leaving prior to being seen by health care provider: Secondary | ICD-10-CM | POA: Insufficient documentation

## 2021-01-18 DIAGNOSIS — E10649 Type 1 diabetes mellitus with hypoglycemia without coma: Secondary | ICD-10-CM | POA: Diagnosis not present

## 2021-01-18 LAB — CBC WITH DIFFERENTIAL/PLATELET
Abs Immature Granulocytes: 0.03 10*3/uL (ref 0.00–0.07)
Basophils Absolute: 0.1 10*3/uL (ref 0.0–0.1)
Basophils Relative: 1 %
Eosinophils Absolute: 0.4 10*3/uL (ref 0.0–0.5)
Eosinophils Relative: 5 %
HCT: 38.9 % — ABNORMAL LOW (ref 39.0–52.0)
Hemoglobin: 12.8 g/dL — ABNORMAL LOW (ref 13.0–17.0)
Immature Granulocytes: 0 %
Lymphocytes Relative: 37 %
Lymphs Abs: 3 10*3/uL (ref 0.7–4.0)
MCH: 30.2 pg (ref 26.0–34.0)
MCHC: 32.9 g/dL (ref 30.0–36.0)
MCV: 91.7 fL (ref 80.0–100.0)
Monocytes Absolute: 0.8 10*3/uL (ref 0.1–1.0)
Monocytes Relative: 10 %
Neutro Abs: 3.8 10*3/uL (ref 1.7–7.7)
Neutrophils Relative %: 47 %
Platelets: 261 10*3/uL (ref 150–400)
RBC: 4.24 MIL/uL (ref 4.22–5.81)
RDW: 13.2 % (ref 11.5–15.5)
WBC: 8.1 10*3/uL (ref 4.0–10.5)
nRBC: 0 % (ref 0.0–0.2)

## 2021-01-18 LAB — BASIC METABOLIC PANEL
Anion gap: 11 (ref 5–15)
BUN: 21 mg/dL (ref 8–23)
CO2: 23 mmol/L (ref 22–32)
Calcium: 9.2 mg/dL (ref 8.9–10.3)
Chloride: 97 mmol/L — ABNORMAL LOW (ref 98–111)
Creatinine, Ser: 1.39 mg/dL — ABNORMAL HIGH (ref 0.61–1.24)
GFR, Estimated: 57 mL/min — ABNORMAL LOW (ref >60)
Glucose, Bld: 154 mg/dL — ABNORMAL HIGH (ref 70–99)
Potassium: 3.5 mmol/L (ref 3.5–5.1)
Sodium: 131 mmol/L — ABNORMAL LOW (ref 135–145)

## 2021-01-18 LAB — URINALYSIS, ROUTINE W REFLEX MICROSCOPIC
Bilirubin Urine: NEGATIVE
Glucose, UA: NEGATIVE mg/dL
Hgb urine dipstick: NEGATIVE
Ketones, ur: NEGATIVE mg/dL
Leukocytes,Ua: NEGATIVE
Nitrite: NEGATIVE
Protein, ur: NEGATIVE mg/dL
Specific Gravity, Urine: 1.005 (ref 1.005–1.030)
pH: 6 (ref 5.0–8.0)

## 2021-01-18 LAB — CBG MONITORING, ED: Glucose-Capillary: 157 mg/dL — ABNORMAL HIGH (ref 70–99)

## 2021-01-18 NOTE — ED Provider Notes (Signed)
Emergency Medicine Provider Triage Evaluation Note  Andrew [name] , a 65 y.o. male  was evaluated in triage.  Pt complains of hypoglycemia.  States his sugar got down to around 140 today and he started to feel bad.  He reports usual sugar ranges from 120's-200's.  Recently started on Januvia about 1 month ago, still taking metformin as well.  Denies N/V/D.  Review of Systems  Positive: hypoglycemia Negative: Nausea, vomiting, diarrhea, fever  Physical Exam  BP (!) 157/74 (BP Location: Right Arm)   Pulse 63   Temp 98.6 F (37 C)   Resp 18   SpO2 99%  Gen:   Awake, no distress   Resp:  Normal effort  MSK:   Moves extremities without difficulty  Other:    Medical Decision Making  Medically screening exam initiated at 10:26 PM.  Appropriate orders placed.  Harriett Sine was informed that the remainder of the evaluation will be completed by another provider, this initial triage assessment does not replace that evaluation, and the importance of remaining in the ED until their evaluation is complete.  CBG 157 on arrival.  Will check labs, UA.   Larene Pickett, PA-C 01/18/21 2228    Gareth Morgan, MD 01/19/21 1039

## 2021-01-18 NOTE — ED Triage Notes (Signed)
Pt reports hypoglycemia tonight of 140 and was concerned for it dropping lower, also reports fatigue with some dizziness. Pt just started a new medication of Januvia for x1 month now. Pt took metformin before dinner. Denies n/v or pain.

## 2021-01-19 NOTE — ED Notes (Signed)
No answer for VS x 2 

## 2021-01-19 NOTE — ED Notes (Signed)
No answer for VS x3

## 2021-01-28 ENCOUNTER — Other Ambulatory Visit: Payer: Self-pay | Admitting: Family Medicine

## 2021-01-30 ENCOUNTER — Ambulatory Visit: Payer: Medicare HMO

## 2021-02-10 ENCOUNTER — Ambulatory Visit: Payer: Medicare HMO | Admitting: Podiatry

## 2021-02-10 ENCOUNTER — Other Ambulatory Visit: Payer: Self-pay

## 2021-02-10 DIAGNOSIS — M7752 Other enthesopathy of left foot: Secondary | ICD-10-CM | POA: Diagnosis not present

## 2021-02-10 MED ORDER — TRAMADOL HCL 50 MG PO TABS: 50.0000 mg | ORAL_TABLET | Freq: Three times a day (TID) | ORAL | 0 refills | Status: AC | PRN

## 2021-02-14 NOTE — Progress Notes (Signed)
Subjective:  Patient ID: Andrew [name], male    DOB: 1956/01/19,  MRN: [id number]  Chief Complaint  Patient presents with   Gout    Left hallux gout flare up     65 y.o. male presents with the above complaint.  Patient presents with a follow-up of left first metatarsophalangeal joint pain.  Patient states the injection helps a lot.  He is here for his another injection as it does help him get better.  He does not have any secondary complaint today.  He would like to know if he can get some tramadol to help with the pain occasionally.   Review of Systems: Negative except as noted in the HPI. Denies N/V/F/Ch.  Past Medical History:  Diagnosis Date   Arthritis    Chronic left shoulder pain 09/20/2017   CKD (chronic kidney disease) stage 3, GFR 30-59 ml/min (HCC) 09/05/2006   CRD (chronic renal disease), stage II    Baseline Cr 1.2   CVA (cerebral vascular accident) ([place])    x4 last one in 2007, R sided residual weakness   DSORD, ADJST W/MIXED ANXIETY/DEPRESSED MOOD 10/18/2006   Qualifier: Diagnosis of  By: Dalbert Mayotte     ERECTILE DYSFUNCTION 10/18/2006   Qualifier: Diagnosis of  By: Dalbert Mayotte     HAMMER TOE 07/25/2010   HLD (hyperlipidemia)    HTN (hypertension)    Hypothyroidism    Leg swelling    Lipoma of back 03/18/2011   Personal history of colonic adenomas 04/01/2013   Primary osteoarthritis of right knee 05/21/2018   Prostatitis    hx of x2   PSEUDOFOLLICULITIS BARBAE 02/09/6961   Qualifier: Diagnosis of  By: Danise Mina  MD, Garlon Hatchet     Sarcoidosis    Status post right knee replacement 05/15/2017   T2DM (type 2 diabetes mellitus) ([place])     Current Outpatient Medications:    traMADol (ULTRAM) 50 MG tablet, Take 1 tablet (50 mg total) by mouth every 8 (eight) hours as needed for up to 5 days., Disp: 15 tablet, Rfl: 0   Acetaminophen (TYLENOL ARTHRITIS EXT RELIEF PO), Take 650 mg by mouth 1 day or 1 dose., Disp: , Rfl:    Alcohol Swabs PADS, Please use prior  to checking blood sugar levels up to two times daily., Disp: 120 each, Rfl: 3   allopurinol (ZYLOPRIM) 100 MG tablet, TAKE 2 TABLETS EVERY DAY, Disp: 180 tablet, Rfl: 3   amLODipine (NORVASC) 10 MG tablet, Take 1 tablet (10 mg total) by mouth daily., Disp: 90 tablet, Rfl: 2   aspirin EC 81 MG tablet, Take 1 tablet (81 mg total) by mouth daily., Disp: 90 tablet, Rfl: 2   Blood Glucose Calibration (TRUE METRIX LEVEL 1) Low SOLN, Please use to calibrate glucometer., Disp: 1 each, Rfl: 3   Blood Glucose Calibration (TRUE METRIX LEVEL 2) Normal SOLN, Please use to calibrate glucometer., Disp: 1 each, Rfl: 3   Blood Glucose Calibration (TRUE METRIX LEVEL 3) High SOLN, Please use to calibrate glucometer., Disp: 1 each, Rfl: 3   Blood Glucose Monitoring Suppl (TRUE METRIX AIR GLUCOSE METER) w/Device KIT, 1 each by Does not apply route 2 (two) times daily. Use to test blood sugar two times a day.  DX code: E11.9, Disp: 1 kit, Rfl: 0   cholecalciferol (VITAMIN D) 25 MCG (1000 UNIT) tablet, TAKE 1 TABLET EVERY DAY, Disp: 90 tablet, Rfl: 3   Continuous Blood Gluc Sensor (DEXCOM G6 SENSOR) MISC, Inject 1 applicator into the skin as  directed. Change sensor every 10 days., Disp: 3 each, Rfl: 11   Continuous Blood Gluc Transmit (DEXCOM G6 TRANSMITTER) MISC, Inject 1 Device into the skin as directed. Reuse 8 times with sensor changes., Disp: 1 each, Rfl: 3   enalapril (VASOTEC) 20 MG tablet, TAKE 2 TABLETS EVERY DAY, Disp: 180 tablet, Rfl: 3   Ginger, Zingiber officinalis, (GINGER PO), Take 1 each by mouth daily. Boils fresh ginger and drinks as tea., Disp: , Rfl:    latanoprost (XALATAN) 0.005 % ophthalmic solution, Place 1 drop into both eyes at bedtime., Disp: , Rfl:    levothyroxine (SYNTHROID) 75 MCG tablet, TAKE 1 TABLET EVERY DAY BEFORE BREAKFAST, Disp: 90 tablet, Rfl: 3   meloxicam (MOBIC) 15 MG tablet, Take 1 tablet (15 mg total) by mouth daily., Disp: 30 tablet, Rfl: 3   metFORMIN (GLUCOPHAGE-XR) 500 MG 24  hr tablet, Take 2 tablets (1,000 mg total) by mouth 2 (two) times daily., Disp: 360 tablet, Rfl: 2   naproxen (NAPROSYN) 500 MG tablet, TAKE 1 TABLET EVERY DAY AS NEEDED FOR MODERATE PAIN, Disp: 30 tablet, Rfl: 0   rosuvastatin (CRESTOR) 20 MG tablet, TAKE 1 TABLET (20 MG TOTAL) BY MOUTH DAILY., Disp: 90 tablet, Rfl: 3   sitaGLIPtin (JANUVIA) 100 MG tablet, Take 1 tablet (100 mg total) by mouth daily., Disp: 90 tablet, Rfl: 2   tamsulosin (FLOMAX) 0.4 MG CAPS capsule, TAKE 2 CAPSULES EVERY DAY, Disp: 180 capsule, Rfl: 3   TRUE METRIX BLOOD GLUCOSE TEST test strip, TEST  5  TIMES  DAILY, Disp: 450 strip, Rfl: 1   TRUEplus Lancets 33G MISC, Please use to check blood sugar up to two times daily. E11.9, Disp: 100 each, Rfl: 12   Turmeric POWD, Take 5 mLs by mouth daily. Mixes 1 teaspoon in water every day and drinks it. Uses for arthritis., Disp: , Rfl:   Social History   Tobacco Use  Smoking Status Never  Smokeless Tobacco Never  Tobacco Comments   Never smoker; no plans to start    Allergies  Allergen Reactions   Metoprolol Succinate Other (See Comments)    Bradycardia (HR to 42)   Penicillins Itching    Has patient had a PCN reaction causing immediate rash, facial/tongue/throat swelling, SOB or lightheadedness with hypotension: No Has patient had a PCN reaction causing severe rash involving mucus membranes or skin necrosis: No Has patient had a PCN reaction that required hospitalization No Has patient had a PCN reaction occurring within the last 10 years: No If all of the above answers are "NO", then may proceed with Cephalosporin use.   Empagliflozin Other (See Comments)    Dizzy , Dysphoria,  Patient preference to avoid use.    Objective:  There were no vitals filed for this visit. There is no height or weight on file to calculate BMI. Constitutional Well developed. Well nourished.  Vascular Dorsalis pedis pulses palpable bilaterally. Posterior tibial pulses palpable  bilaterally. Capillary refill normal to all digits.  No cyanosis or clubbing noted. Pedal hair growth normal.  Neurologic Normal speech. Oriented to person, place, and time. Epicritic sensation to light touch grossly present bilaterally.  Dermatologic Nails well groomed and normal in appearance. No open wounds. No skin lesions.  Orthopedic:  Pain on palpation to the left first metatarsophalangeal joint.  Pain with range of motion of the first MPJ.  Mild intra-articular pain noted.  Pain on palpation of the right first metatarsophalangeal joint mild pain on palpation to the joint. Very mild  pain on palpation to the left second toe at the level of the DIPJ joint.  No pain with range of motion of either joints.   Radiographs: 3 views of skeletally mature adult bilateral foot: Osteoarthritic changes noted to the midfoot bilaterally.  Osteoarthrosis noted to the first metatarsophalangeal joint bilaterally with left greater than right side.  Generalized osteopenia noted.  No other bony abnormalities noted.  Severe pes planus foot structure noted as well. Assessment:   No diagnosis found.  Plan:  Patient was evaluated and treated and all questions answered.  Left first metatarsophalangeal joint capsulitis with underlying osteoarthritis left greater than right -I explained to the patient the etiology of osteoarthritis and various treatment options were discussed.  I believe patient will benefit from a steroid injection given that this is worse than right side.  This will help decrease the acute inflammatory component associated with pain.  Patient agrees with the plan would like to proceed with a steroid injection.  I will hold off on a steroid injection to the right side as it is not acutely painful. -Asteroid injection was performed at left first metatarsophalangeal joint using 1% plain Lidocaine and 10 mg of Kenalog. This was well tolerated. -Tramadol was dispensed for pain control.  Left second  digit mild contusion -I explained to the patient the etiology of mild contusion various treatment options were discussed.  Given that this is very mild in nature without any acute pain I discussed with him that he can just benefit from anti-inflammatory.  Mobic was dispensed to help with mild pain that he is having to the second digit.  Patient agrees with the plan.  At this time I am not concerned for any kind of fracture as he has not had any kind of trauma to that toe.   No follow-ups on file.

## 2021-02-17 ENCOUNTER — Other Ambulatory Visit: Payer: Self-pay

## 2021-02-17 ENCOUNTER — Ambulatory Visit (INDEPENDENT_AMBULATORY_CARE_PROVIDER_SITE_OTHER): Payer: Medicare HMO | Admitting: Family Medicine

## 2021-02-17 VITALS — BP 128/65 | HR 69 | Ht 69.0 in | Wt 218.4 lb

## 2021-02-17 DIAGNOSIS — E119 Type 2 diabetes mellitus without complications: Secondary | ICD-10-CM | POA: Diagnosis not present

## 2021-02-17 DIAGNOSIS — Z23 Encounter for immunization: Secondary | ICD-10-CM

## 2021-02-17 LAB — POCT GLYCOSYLATED HEMOGLOBIN (HGB A1C): HbA1c, POC (controlled diabetic range): 7.7 % — AB (ref 0.0–7.0)

## 2021-02-17 NOTE — Progress Notes (Signed)
    SUBJECTIVE:   CHIEF COMPLAINT / HPI:   Andrew [name] is a 65 yo who presents to follow up on his diabetes. He states he is concerned about low blood sugars. Reports feeling sleepy and tired and will wake up and go to the bathroom and feels dizzy and then will check his sugar in the middle of night and ranges from 126-140. Lowest he has seen is 127. Feels ok when its 160 but feels bad when under 150. Denies any syncopal episodes.   Last A1c on 5/11 was 7.3. Has been on Metformin '1000mg'$  BID and Januvia '100mg'$ . Previuosly tried GLP-1/SGLT2 but did not tolerate.   Denies smoking. Does not add salt to diet. Denies drinking sugary beverages. Does eat a lot of carbs including breads and potatoes.   Eye exam scheduled at end of this month   OBJECTIVE:   BP 128/65   Pulse 69   Ht '5\' 9"'$  (1.753 m)   Wt 218 lb 6.4 oz (99.1 kg)   SpO2 98%   BMI 32.25 kg/m    Physical exam General: well appearing, NAD Cardiovascular: RRR, no murmurs Lungs: CTAB. Normal WOB Abdomen: soft, non-distended, non-tender Skin: warm, dry. No edema  ASSESSMENT/PLAN:   No problem-specific Assessment & Plan notes found for this encounter.   Type 2 diabetes A1c 7.7 up from 7.3 on 5/11. Patient wanting to take less of his Metformin, so discussed taking '1000mg'$  Metformin daily instead of twice daily. Will continue Januvia '100mg'$ . Discussed at length importance of nutrition and how his carbs are converted to sugar and that if we are decreasing his medication it will be important to monitor nutrition. Discussed that his fasting sugars are at goal and not to be worried about those numbers.   HLD  Hx CVA. Continue Crestor for secondary prevention   HTN BP 128/65. Well controlled on current regimen of Enalapril and Norvasc   F/u in 3 months for next diabetes check   [name], [place]

## 2021-02-17 NOTE — Patient Instructions (Addendum)
It was great seeing you today! You are doing a great job in keeping up with your health  We followed up on your diabetes and your A1c increased a little to 7.7. You are requesting to cut down on metformin so we will go from 1000 twice a day to 1000 once a day.  But in doing this, it is very important to watch your diet and limit your starch intake (bagels, potatoes, etc). Continue to limit sugary beverages and sweets.  Since we are cutting down your medication it is important to really watch your nutrition. Also try to get in physical activity and walking especially as it starts to cool down.   Today we also gave you the pneumonia vaccine.    Please check-out at the front desk before leaving the clinic. I'd like to see you back in 3 months for your next follow up, but if you need to be seen earlier than that for any new issues we're happy to fit you in, just give [place] a call!  Visit Reminders: - Continue to work on your healthy eating habits and incorporating exercise into your daily life.   Feel free to call with any questions or concerns at any time, at [phone].   Take care,  Dr. [name] [facility]    Diabetes Mellitus and Nutrition, Adult When you have diabetes, or diabetes mellitus, it is very important to have healthy eating habits because your blood sugar (glucose) levels are greatly affected by what you eat and drink. Eating healthy foods in the right amounts, at about the same times every day, can help you: Control your blood glucose. Lower your risk of heart disease. Improve your blood pressure. Reach or maintain a healthy weight. What can affect my meal plan? Every person with diabetes is different, and each person has different needs for a meal plan. Your health care provider may recommend that you work with a dietitian to make a meal plan that is best for you. Your meal plan may vary depending on factors such as: The calories you need. The  medicines you take. Your weight. Your blood glucose, blood pressure, and cholesterol levels. Your activity level. Other health conditions you have, such as heart or kidney disease. How do carbohydrates affect me? Carbohydrates, also called carbs, affect your blood glucose level more than any other type of food. Eating carbs naturally raises the amount of glucose in your blood. Carb counting is a method for keeping track of how many carbs you eat. Counting carbs is important to keep your blood glucose at a healthy level,especially if you use insulin or take certain oral diabetes medicines. It is important to know how many carbs you can safely have in each meal. This is different for every person. Your dietitian can help you calculate how manycarbs you should have at each meal and for each snack. How does alcohol affect me? Alcohol can cause a sudden decrease in blood glucose (hypoglycemia), especially if you use insulin or take certain oral diabetes medicines. Hypoglycemia can be a life-threatening condition. Symptoms of hypoglycemia, such as sleepiness, dizziness, and confusion, are similar to symptoms of having too much alcohol. Do not drink alcohol if: Your health care provider tells you not to drink. You are pregnant, may be pregnant, or are planning to become pregnant. If you drink alcohol: Do not drink on an empty stomach. Limit how much you use to: 0-1 drink a day for women. 0-2 drinks a day  for men. Be aware of how much alcohol is in your drink. In the [place] one drink equals one 12 oz bottle of beer (355 mL), one 5 oz glass of wine (148 mL), or one 1 oz glass of hard liquor (44 mL). Keep yourself hydrated with water, diet soda, or unsweetened iced tea. Keep in mind that regular soda, juice, and other mixers may contain a lot of sugar and must be counted as carbs. What are tips for following this plan?  Reading food labels Start by checking the serving size on the "Nutrition Facts"  label of packaged foods and drinks. The amount of calories, carbs, fats, and other nutrients listed on the label is based on one serving of the item. Many items contain more than one serving per package. Check the total grams (g) of carbs in one serving. You can calculate the number of servings of carbs in one serving by dividing the total carbs by 15. For example, if a food has 30 g of total carbs per serving, it would be equal to 2 servings of carbs. Check the number of grams (g) of saturated fats and trans fats in one serving. Choose foods that have a low amount or none of these fats. Check the number of milligrams (mg) of salt (sodium) in one serving. Most people should limit total sodium intake to less than 2,300 mg per day. Always check the nutrition information of foods labeled as "low-fat" or "nonfat." These foods may be higher in added sugar or refined carbs and should be avoided. Talk to your dietitian to identify your daily goals for nutrients listed on the label. Shopping Avoid buying canned, pre-made, or processed foods. These foods tend to be high in fat, sodium, and added sugar. Shop around the outside edge of the grocery store. This is where you will most often find fresh fruits and vegetables, bulk grains, fresh meats, and fresh dairy. Cooking Use low-heat cooking methods, such as baking, instead of high-heat cooking methods like deep frying. Cook using healthy oils, such as olive, canola, or sunflower oil. Avoid cooking with butter, cream, or high-fat meats. Meal planning Eat meals and snacks regularly, preferably at the same times every day. Avoid going long periods of time without eating. Eat foods that are high in fiber, such as fresh fruits, vegetables, beans, and whole grains. Talk with your dietitian about how many servings of carbs you can eat at each meal. Eat 4-6 oz (112-168 g) of lean protein each day, such as lean meat, chicken, fish, eggs, or tofu. One ounce (oz) of lean  protein is equal to: 1 oz (28 g) of meat, chicken, or fish. 1 egg.  cup (62 g) of tofu. Eat some foods each day that contain healthy fats, such as avocado, nuts, seeds, and fish. What foods should I eat? Fruits Berries. Apples. Oranges. Peaches. Apricots. Plums. Grapes. Mango. Papaya.Pomegranate. Kiwi. Cherries. Vegetables Lettuce. Spinach. Leafy greens, including kale, chard, collard greens, and mustard greens. Beets. Cauliflower. Cabbage. Broccoli. Carrots. Green beans.Tomatoes. Peppers. Onions. Cucumbers. Brussels sprouts. Grains Whole grains, such as whole-wheat or whole-grain bread, crackers, tortillas,cereal, and pasta. Unsweetened oatmeal. Quinoa. Brown or wild rice. Meats and other proteins Seafood. Poultry without skin. Lean cuts of poultry and beef. Tofu. Nuts. Seeds. Dairy Low-fat or fat-free dairy products such as milk, yogurt, and cheese. The items listed above may not be a complete list of foods and beverages you can eat. Contact a dietitian for more information. What foods should I avoid? Fruits  Fruits canned with syrup. Vegetables Canned vegetables. Frozen vegetables with butter or cream sauce. Grains Refined white flour and flour products such as bread, pasta, snack foods, andcereals. Avoid all processed foods. Meats and other proteins Fatty cuts of meat. Poultry with skin. Breaded or fried meats. Processed meat.Avoid saturated fats. Dairy Full-fat yogurt, cheese, or milk. Beverages Sweetened drinks, such as soda or iced tea. The items listed above may not be a complete list of foods and beverages you should avoid. Contact a dietitian for more information. Questions to ask a health care provider Do I need to meet with a diabetes educator? Do I need to meet with a dietitian? What number can I call if I have questions? When are the best times to check my blood glucose? Where to find more information: American Diabetes Association: diabetes.org Academy of  Nutrition and Dietetics: www.eatright.[employer] of Diabetes and Digestive and Kidney Diseases: [employer] Association of Diabetes Care and Education Specialists: www.diabeteseducator.org Summary It is important to have healthy eating habits because your blood sugar (glucose) levels are greatly affected by what you eat and drink. A healthy meal plan will help you control your blood glucose and maintain a healthy lifestyle. Your health care provider may recommend that you work with a dietitian to make a meal plan that is best for you. Keep in mind that carbohydrates (carbs) and alcohol have immediate effects on your blood glucose levels. It is important to count carbs and to use alcohol carefully. This information is not intended to replace advice given to you by your health care provider. Make sure you discuss any questions you have with your healthcare provider. Document Revised: 06/02/2019 Document Reviewed: 06/02/2019 Elsevier Patient Education  2021 [employer].

## 2021-03-08 DIAGNOSIS — H401131 Primary open-angle glaucoma, bilateral, mild stage: Secondary | ICD-10-CM | POA: Diagnosis not present

## 2021-03-08 DIAGNOSIS — H52203 Unspecified astigmatism, bilateral: Secondary | ICD-10-CM | POA: Diagnosis not present

## 2021-03-08 DIAGNOSIS — Z7984 Long term (current) use of oral hypoglycemic drugs: Secondary | ICD-10-CM | POA: Diagnosis not present

## 2021-03-08 DIAGNOSIS — H524 Presbyopia: Secondary | ICD-10-CM | POA: Diagnosis not present

## 2021-03-08 DIAGNOSIS — H5213 Myopia, bilateral: Secondary | ICD-10-CM | POA: Diagnosis not present

## 2021-03-08 DIAGNOSIS — E119 Type 2 diabetes mellitus without complications: Secondary | ICD-10-CM | POA: Diagnosis not present

## 2021-03-08 DIAGNOSIS — Z961 Presence of intraocular lens: Secondary | ICD-10-CM | POA: Diagnosis not present

## 2021-03-08 LAB — HM DIABETES EYE EXAM

## 2021-03-14 ENCOUNTER — Other Ambulatory Visit: Payer: Self-pay | Admitting: Family Medicine

## 2021-03-14 DIAGNOSIS — E1122 Type 2 diabetes mellitus with diabetic chronic kidney disease: Secondary | ICD-10-CM

## 2021-03-14 DIAGNOSIS — N183 Chronic kidney disease, stage 3 unspecified: Secondary | ICD-10-CM

## 2021-03-14 DIAGNOSIS — M1990 Unspecified osteoarthritis, unspecified site: Secondary | ICD-10-CM

## 2021-03-16 ENCOUNTER — Other Ambulatory Visit: Payer: Self-pay

## 2021-03-16 MED ORDER — TRUE METRIX BLOOD GLUCOSE TEST VI STRP: 1.0000 | ORAL_STRIP | Freq: Every day | 1 refills | Status: DC

## 2021-03-20 ENCOUNTER — Other Ambulatory Visit: Payer: Self-pay | Admitting: Podiatry

## 2021-03-20 ENCOUNTER — Telehealth: Payer: Self-pay | Admitting: Podiatry

## 2021-03-20 MED ORDER — TRAMADOL HCL 50 MG PO TABS: 50.0000 mg | ORAL_TABLET | Freq: Three times a day (TID) | ORAL | 0 refills | Status: AC | PRN

## 2021-03-20 NOTE — Telephone Encounter (Signed)
Patient called the office wanting a refill on his pain medication.

## 2021-03-21 MED ORDER — MELOXICAM 15 MG PO TABS: 15.0000 mg | ORAL_TABLET | Freq: Every day | ORAL | 3 refills | Status: DC

## 2021-03-21 NOTE — Telephone Encounter (Signed)
Received phone call from pharmacy tech regarding this rx.  Please advise.   Talbot Grumbling, RN

## 2021-03-23 ENCOUNTER — Ambulatory Visit (INDEPENDENT_AMBULATORY_CARE_PROVIDER_SITE_OTHER): Payer: Medicare HMO

## 2021-03-23 ENCOUNTER — Other Ambulatory Visit: Payer: Self-pay

## 2021-03-23 DIAGNOSIS — Z23 Encounter for immunization: Secondary | ICD-10-CM | POA: Diagnosis not present

## 2021-03-23 NOTE — Progress Notes (Signed)
Patient presents to nurse clinic for flu vaccination. Administered in LD, site unremarkable, tolerated injection well.   Blakely Maranan C Mykaela Arena, RN   

## 2021-03-31 ENCOUNTER — Other Ambulatory Visit: Payer: Self-pay

## 2021-03-31 ENCOUNTER — Encounter: Payer: Self-pay | Admitting: Family Medicine

## 2021-03-31 ENCOUNTER — Ambulatory Visit (INDEPENDENT_AMBULATORY_CARE_PROVIDER_SITE_OTHER): Payer: Medicare HMO | Admitting: Family Medicine

## 2021-03-31 VITALS — BP 158/66 | HR 65 | Wt 221.8 lb

## 2021-03-31 DIAGNOSIS — Z79899 Other long term (current) drug therapy: Secondary | ICD-10-CM | POA: Diagnosis not present

## 2021-03-31 DIAGNOSIS — M5430 Sciatica, unspecified side: Secondary | ICD-10-CM

## 2021-03-31 DIAGNOSIS — I10 Essential (primary) hypertension: Secondary | ICD-10-CM | POA: Diagnosis not present

## 2021-03-31 NOTE — Patient Instructions (Addendum)
It was great seeing you today!  Today you came in to discuss medications and the switching of your prescriptions to [facility] on 10/1. I have documented this.   We are not currently able to give Boosters, but should have them available in the next couple of weeks. You can call the clinic and see about coming in to see a nurse just for the shot.  Your blood pressure today was a little elevated, and I would like you to please record your blood pressure checks in the morning and at night for at least 3 days.  Please check-out at the front desk before leaving the clinic. I'd like to see you back in 2 months for your diabetes follow up, but if you need to be seen earlier than that for any new issues we're happy to fit you in, just give [place] a call!  Feel free to call with any questions or concerns at any time, at [phone].   Take care,  Dr. [name] [facility]

## 2021-03-31 NOTE — Progress Notes (Signed)
Patient informs that he will begin using [facility] Delivery315 [phone] as of April 08, 2021.  Going forward all medication RX's should be sent to [facility].  Patient will bring new insurance card in when he receives it.  Ozella Almond, [place]

## 2021-03-31 NOTE — Progress Notes (Signed)
    SUBJECTIVE:   CHIEF COMPLAINT / HPI:   Mr. Hoogendoorn is a 65 yo who presents to discuss his medications because his pharmacy will be switched over to [place] on 10/1. He requests a 90 day supply of all medications once it switches.   HTN: BP elevated today 158/66. Denies CP, headaches, change in vision.   Sciatica: controlled on as needed Meloxicam. Also takes Tylenol arthritis which does not help him so he takes the Meloxicam   OBJECTIVE:   BP (!) 158/66   Pulse 65   Wt 221 lb 12.8 oz (100.6 kg)   SpO2 100%   BMI 32.75 kg/m    Physical exam  General: well appearing, NAD Cardiovascular: RRR, no murmurs Lungs: CTAB. Normal WOB Abdomen: soft, non-distended, non-tender Skin: warm, dry. No edema  ASSESSMENT/PLAN:   No problem-specific Assessment & Plan notes found for this encounter.  Medication management Verified medication list matches all of patient's current medications which he brought to the visit. Pharmacy will change 10/1 and switched to [facility]. He states he will need a 90 day supply of his medications after the switch.   HTN BP elevated today at 158/66. Currently on Amlodipine 10mg  and Enalapril 20mg . He states he has been eating a lot of sausage and thinks that is why it is elevated. Advised patient to take his BP at home in the morning and at night for several days and let me know the results. May need to make adjustments to medication if BP still elevated at home.   Sciatica  Controlled on Meloxicam which he uses as needed. He states Tylenol and Voltaren gel are not helpful. Discussed avoiding NSAIDs as much as possible given his kidney function.   F/u in 1-2 months to check on diabetes and HTN   [name], [place]

## 2021-04-04 ENCOUNTER — Other Ambulatory Visit: Payer: Self-pay

## 2021-04-04 ENCOUNTER — Encounter: Payer: Self-pay | Admitting: Podiatry

## 2021-04-04 ENCOUNTER — Ambulatory Visit: Payer: Medicare HMO | Admitting: Podiatry

## 2021-04-04 DIAGNOSIS — M19072 Primary osteoarthritis, left ankle and foot: Secondary | ICD-10-CM | POA: Diagnosis not present

## 2021-04-04 DIAGNOSIS — E1142 Type 2 diabetes mellitus with diabetic polyneuropathy: Secondary | ICD-10-CM | POA: Diagnosis not present

## 2021-04-04 MED ORDER — GABAPENTIN 100 MG PO CAPS: 100.0000 mg | ORAL_CAPSULE | Freq: Every day | ORAL | 5 refills | Status: DC

## 2021-04-04 MED ORDER — TRAMADOL HCL 50 MG PO TABS: 50.0000 mg | ORAL_TABLET | Freq: Three times a day (TID) | ORAL | 0 refills | Status: DC | PRN

## 2021-04-04 NOTE — Progress Notes (Signed)
He presents today for a follow-up of pain to the first metatarsophalangeal joint of the left foot.  He states that is really throbbing at nighttime but during the day is really not as painful.  States that his blood sugar has been running pretty well at a 7.7 hemoglobin A1c.  States that he has only numbness around the great toe left foot but denies numbness in his hands and feet otherwise.  Denies any trauma denies any back problems.  Objective: Vital signs are stable alert oriented x3 pulses are barely palpable but radiographs reviewed from previous evaluation demonstrate medial calcific sclerosis.  Toes are warm to the touch and capillary fill time is immediate.  He has hypopigmentation around the first metatarsophalangeal joint for this African-American male with streaking most likely associated with steroid injections.  He does have no tenderness on range of motion or palpation of the joint.  Assessment: Capsulitis probable osteoarthritis and possible mononeuropathy associated with either a history of trauma or diabetic neuropathy.  Also has a history of gout.  Plan: Recommend that he continue his allopurinol regularly.  I did refill his tramadol for Dr. Posey Pronto.  I also started him on gabapentin 100 mg at nighttime.  Did not want to add a NSAID because his creatinine was increased.  He will follow-up with Dr. Posey Pronto in a month for med check to see if the gabapentin did anything for him.

## 2021-04-11 ENCOUNTER — Other Ambulatory Visit: Payer: Self-pay

## 2021-04-12 MED ORDER — ALLOPURINOL 100 MG PO TABS: 200.0000 mg | ORAL_TABLET | Freq: Every day | ORAL | 3 refills | Status: DC

## 2021-04-17 ENCOUNTER — Other Ambulatory Visit: Payer: Self-pay

## 2021-04-17 DIAGNOSIS — E1122 Type 2 diabetes mellitus with diabetic chronic kidney disease: Secondary | ICD-10-CM

## 2021-04-18 ENCOUNTER — Other Ambulatory Visit: Payer: Self-pay

## 2021-04-18 ENCOUNTER — Ambulatory Visit (INDEPENDENT_AMBULATORY_CARE_PROVIDER_SITE_OTHER): Payer: Medicare HMO

## 2021-04-18 DIAGNOSIS — Z23 Encounter for immunization: Secondary | ICD-10-CM

## 2021-04-18 MED ORDER — TRUE METRIX BLOOD GLUCOSE TEST VI STRP: 1.0000 | ORAL_STRIP | Freq: Every day | 1 refills | Status: DC

## 2021-05-05 ENCOUNTER — Ambulatory Visit: Payer: Medicare HMO | Admitting: Podiatry

## 2021-05-09 ENCOUNTER — Telehealth: Payer: Self-pay | Admitting: Podiatry

## 2021-05-09 MED ORDER — GABAPENTIN 100 MG PO CAPS: 100.0000 mg | ORAL_CAPSULE | Freq: Every day | ORAL | 5 refills | Status: DC

## 2021-05-09 MED ORDER — TRAMADOL HCL 50 MG PO TABS: 50.0000 mg | ORAL_TABLET | Freq: Three times a day (TID) | ORAL | 0 refills | Status: AC | PRN

## 2021-05-09 NOTE — Addendum Note (Signed)
Addended by: Boneta Lucks on: 05/09/2021 08:49 AM   Modules accepted: Orders

## 2021-05-09 NOTE — Telephone Encounter (Signed)
Patient called and lvm on the general mail box and stated that he is having some pain and would like Dr. Posey Pronto to refill his gabapentin.  Please Advise

## 2021-05-09 NOTE — Telephone Encounter (Signed)
Pt called office and requested tramadol refill. Thanks

## 2021-05-09 NOTE — Addendum Note (Signed)
Addended by: Boneta Lucks on: 05/09/2021 09:56 AM   Modules accepted: Orders

## 2021-05-12 ENCOUNTER — Ambulatory Visit: Payer: Medicare HMO | Admitting: Podiatry

## 2021-05-12 ENCOUNTER — Other Ambulatory Visit: Payer: Self-pay

## 2021-05-12 DIAGNOSIS — M7752 Other enthesopathy of left foot: Secondary | ICD-10-CM | POA: Diagnosis not present

## 2021-05-12 DIAGNOSIS — M19072 Primary osteoarthritis, left ankle and foot: Secondary | ICD-10-CM | POA: Diagnosis not present

## 2021-05-12 DIAGNOSIS — E1142 Type 2 diabetes mellitus with diabetic polyneuropathy: Secondary | ICD-10-CM

## 2021-05-12 NOTE — Progress Notes (Signed)
Subjective:  Patient ID: Andrew [name], male    DOB: 04-07-1956,  MRN: [id number]  Chief Complaint  Patient presents with   Foot Pain    Med check     65 y.o. male presents with the above complaint.  Patient presents with a follow-up of left first metatarsophalangeal joint pain.  Patient states is doing well.  He states the tramadol helped with his pain.  He denies any other acute issues.   Review of Systems: Negative except as noted in the HPI. Denies N/V/F/Ch.  Past Medical History:  Diagnosis Date   Arthritis    Chronic left shoulder pain 09/20/2017   CKD (chronic kidney disease) stage 3, GFR 30-59 ml/min (HCC) 09/05/2006   CRD (chronic renal disease), stage II    Baseline Cr 1.2   CVA (cerebral vascular accident) ([place])    x4 last one in 2007, R sided residual weakness   DSORD, ADJST W/MIXED ANXIETY/DEPRESSED MOOD 10/18/2006   Qualifier: Diagnosis of  By: Dalbert Mayotte     ERECTILE DYSFUNCTION 10/18/2006   Qualifier: Diagnosis of  By: Dalbert Mayotte     HAMMER TOE 07/25/2010   HLD (hyperlipidemia)    HTN (hypertension)    Hypothyroidism    Leg swelling    Lipoma of back 03/18/2011   Personal history of colonic adenomas 04/01/2013   Primary osteoarthritis of right knee 05/21/2018   Prostatitis    hx of x2   PSEUDOFOLLICULITIS BARBAE 09/12/1060   Qualifier: Diagnosis of  By: Danise Mina  MD, Garlon Hatchet     Sarcoidosis    Status post right knee replacement 05/15/2017   T2DM (type 2 diabetes mellitus) ([place])     Current Outpatient Medications:    Acetaminophen (TYLENOL ARTHRITIS EXT RELIEF PO), Take 650 mg by mouth 1 day or 1 dose., Disp: , Rfl:    Alcohol Swabs PADS, Please use prior to checking blood sugar levels up to two times daily., Disp: 120 each, Rfl: 3   allopurinol (ZYLOPRIM) 100 MG tablet, Take 2 tablets (200 mg total) by mouth daily., Disp: 180 tablet, Rfl: 3   amLODipine (NORVASC) 10 MG tablet, Take 1 tablet (10 mg total) by mouth daily., Disp: 90 tablet, Rfl:  2   aspirin EC 81 MG tablet, Take 1 tablet (81 mg total) by mouth daily., Disp: 90 tablet, Rfl: 2   Blood Glucose Calibration (TRUE METRIX LEVEL 1) Low SOLN, Please use to calibrate glucometer., Disp: 1 each, Rfl: 3   Blood Glucose Calibration (TRUE METRIX LEVEL 2) Normal SOLN, Please use to calibrate glucometer., Disp: 1 each, Rfl: 3   Blood Glucose Calibration (TRUE METRIX LEVEL 3) High SOLN, Please use to calibrate glucometer., Disp: 1 each, Rfl: 3   Blood Glucose Monitoring Suppl (TRUE METRIX AIR GLUCOSE METER) w/Device KIT, 1 each by Does not apply route 2 (two) times daily. Use to test blood sugar two times a day.  DX code: E11.9, Disp: 1 kit, Rfl: 0   cholecalciferol (VITAMIN D) 25 MCG (1000 UNIT) tablet, TAKE 1 TABLET EVERY DAY, Disp: 90 tablet, Rfl: 3   Continuous Blood Gluc Sensor (DEXCOM G6 SENSOR) MISC, Inject 1 applicator into the skin as directed. Change sensor every 10 days., Disp: 3 each, Rfl: 11   Continuous Blood Gluc Transmit (DEXCOM G6 TRANSMITTER) MISC, Inject 1 Device into the skin as directed. Reuse 8 times with sensor changes., Disp: 1 each, Rfl: 3   enalapril (VASOTEC) 20 MG tablet, TAKE 2 TABLETS EVERY DAY, Disp: 180 tablet, Rfl: 3  gabapentin (NEURONTIN) 100 MG capsule, Take 1 capsule (100 mg total) by mouth at bedtime., Disp: 30 capsule, Rfl: 5   Ginger, Zingiber officinalis, (GINGER PO), Take 1 each by mouth daily. Boils fresh ginger and drinks as tea., Disp: , Rfl:    glucose blood (TRUE METRIX BLOOD GLUCOSE TEST) test strip, 1 each by Other route 5 (five) times daily. for testing, Disp: 450 strip, Rfl: 1   latanoprost (XALATAN) 0.005 % ophthalmic solution, Place 1 drop into both eyes at bedtime., Disp: , Rfl:    levothyroxine (SYNTHROID) 75 MCG tablet, TAKE 1 TABLET EVERY DAY BEFORE BREAKFAST, Disp: 90 tablet, Rfl: 3   meloxicam (MOBIC) 15 MG tablet, Take 1 tablet (15 mg total) by mouth daily., Disp: 30 tablet, Rfl: 3   metFORMIN (GLUCOPHAGE-XR) 500 MG 24 hr tablet,  Take 2 tablets (1,000 mg total) by mouth 2 (two) times daily., Disp: 360 tablet, Rfl: 2   naproxen (NAPROSYN) 500 MG tablet, TAKE 1 TABLET EVERY DAY AS NEEDED FOR MODERATE PAIN, Disp: 30 tablet, Rfl: 0   rosuvastatin (CRESTOR) 20 MG tablet, TAKE 1 TABLET (20 MG TOTAL) BY MOUTH DAILY., Disp: 90 tablet, Rfl: 3   sitaGLIPtin (JANUVIA) 100 MG tablet, Take 1 tablet (100 mg total) by mouth daily., Disp: 90 tablet, Rfl: 2   tamsulosin (FLOMAX) 0.4 MG CAPS capsule, TAKE 2 CAPSULES EVERY DAY, Disp: 180 capsule, Rfl: 3   traMADol (ULTRAM) 50 MG tablet, Take 1 tablet (50 mg total) by mouth every 8 (eight) hours as needed., Disp: 15 tablet, Rfl: 0   traMADol (ULTRAM) 50 MG tablet, Take 1 tablet (50 mg total) by mouth every 8 (eight) hours as needed for up to 5 days., Disp: 15 tablet, Rfl: 0   TRUEplus Lancets 33G MISC, Please use to check blood sugar up to two times daily. E11.9, Disp: 100 each, Rfl: 12   Turmeric POWD, Take 5 mLs by mouth daily. Mixes 1 teaspoon in water every day and drinks it. Uses for arthritis., Disp: , Rfl:   Social History   Tobacco Use  Smoking Status Never  Smokeless Tobacco Never  Tobacco Comments   Never smoker; no plans to start    Allergies  Allergen Reactions   Metoprolol Succinate Other (See Comments)    Bradycardia (HR to 42)   Penicillins Itching    Has patient had a PCN reaction causing immediate rash, facial/tongue/throat swelling, SOB or lightheadedness with hypotension: No Has patient had a PCN reaction causing severe rash involving mucus membranes or skin necrosis: No Has patient had a PCN reaction that required hospitalization No Has patient had a PCN reaction occurring within the last 10 years: No If all of the above answers are "NO", then may proceed with Cephalosporin use.   Empagliflozin Other (See Comments)    Dizzy , Dysphoria,  Patient preference to avoid use.    Objective:  There were no vitals filed for this visit. There is no height or weight on  file to calculate BMI. Constitutional Well developed. Well nourished.  Vascular Dorsalis pedis pulses palpable bilaterally. Posterior tibial pulses palpable bilaterally. Capillary refill normal to all digits.  No cyanosis or clubbing noted. Pedal hair growth normal.  Neurologic Normal speech. Oriented to person, place, and time. Epicritic sensation to light touch grossly present bilaterally.  Dermatologic Nails well groomed and normal in appearance. No open wounds. No skin lesions.  Orthopedic: Very mild pain on palpation to the left first metatarsophalangeal joint.  Mild pain with range of motion  of the first MPJ.  Mild intra-articular pain noted.  Mild pain on palpation of the right first metatarsophalangeal joint mild pain on palpation to the joint. Very mild pain on palpation to the left second toe at the level of the DIPJ joint.  No pain with range of motion of either joints.   Radiographs: 3 views of skeletally mature adult bilateral foot: Osteoarthritic changes noted to the midfoot bilaterally.  Osteoarthrosis noted to the first metatarsophalangeal joint bilaterally with left greater than right side.  Generalized osteopenia noted.  No other bony abnormalities noted.  Severe pes planus foot structure noted as well. Assessment:   1. Diabetic polyneuropathy associated with type 2 diabetes mellitus ([place])   2. Osteoarthritis of first metatarsophalangeal (MTP) joint of left foot   3. Capsulitis of metatarsophalangeal (MTP) joint of left foot     Plan:  Patient was evaluated and treated and all questions answered.  Left first metatarsophalangeal joint capsulitis with underlying osteoarthritis left greater than right -Clinically doing better today.  At this time I will hold off on further steroid injection as he is doing better. -Tramadol was dispensed for pain control. -Gabapentin for pain control  Left second digit mild contusion -I explained to the patient the etiology of mild  contusion various treatment options were discussed.  Given that this is very mild in nature without any acute pain I discussed with him that he can just benefit from anti-inflammatory.  Mobic was dispensed to help with mild pain that he is having to the second digit.  Patient agrees with the plan.  At this time I am not concerned for any kind of fracture as he has not had any kind of trauma to that toe.   No follow-ups on file.

## 2021-05-18 ENCOUNTER — Other Ambulatory Visit: Payer: Self-pay

## 2021-05-18 MED ORDER — ONETOUCH VERIO VI STRP: ORAL_STRIP | 12 refills | Status: DC

## 2021-05-18 MED ORDER — ONETOUCH DELICA LANCETS 33G MISC: 12 refills | Status: DC

## 2021-05-18 MED ORDER — ONETOUCH VERIO W/DEVICE KIT: PACK | 0 refills | Status: DC

## 2021-05-18 MED ORDER — ACCU-CHEK GUIDE ME W/DEVICE KIT
PACK | 0 refills | Status: DC
Start: 2021-05-18 — End: 2021-05-18

## 2021-05-18 NOTE — Telephone Encounter (Signed)
Patient returns call to nurse line. Informed of a new number to call for benefits.   Called provided number. Spoke with representative that states meter needs to be billed through medicare part B. Covered meter would be Onetouch.   I have sent over preferred glucometer supplies to local pharmacy to be ran through part B.   Patient called and informed.   Talbot Grumbling, RN

## 2021-05-18 NOTE — Telephone Encounter (Signed)
Patient calls nurse line reporting that new insurance does not cover true metrix glucometer supplies.   I called [facility] and [employer] directly. They were unable to provide name of covered supplies. Spoke with [employer], regarding supplies and insurance. Recommended sending in accu-chek guide me meter.   Sent to local pharmacy. Called to check coverage. Pharmacist reports that they are receiving rejection on Medicare Part D, saying to bill through part B. Then when ran through part B, rejection says to run through part D.   Pharmacist recommends that patient call insurance company to discuss coverage and what supplies they will cover.   Called patient and informed of above. Patient will call insurance company and return call to nurse line with updated information.   Talbot Grumbling, RN

## 2021-05-26 ENCOUNTER — Other Ambulatory Visit: Payer: Self-pay

## 2021-05-26 ENCOUNTER — Encounter: Payer: Self-pay | Admitting: Family Medicine

## 2021-05-26 ENCOUNTER — Ambulatory Visit (INDEPENDENT_AMBULATORY_CARE_PROVIDER_SITE_OTHER): Payer: Medicare HMO | Admitting: Family Medicine

## 2021-05-26 ENCOUNTER — Other Ambulatory Visit: Payer: Self-pay | Admitting: *Deleted

## 2021-05-26 VITALS — BP 145/79 | HR 83 | Ht 73.0 in | Wt 219.6 lb

## 2021-05-26 DIAGNOSIS — E119 Type 2 diabetes mellitus without complications: Secondary | ICD-10-CM | POA: Diagnosis not present

## 2021-05-26 DIAGNOSIS — I10 Essential (primary) hypertension: Secondary | ICD-10-CM

## 2021-05-26 DIAGNOSIS — L710 Perioral dermatitis: Secondary | ICD-10-CM | POA: Diagnosis not present

## 2021-05-26 LAB — POCT GLYCOSYLATED HEMOGLOBIN (HGB A1C): HbA1c, POC (controlled diabetic range): 8.3 % — AB (ref 0.0–7.0)

## 2021-05-26 MED ORDER — SEMAGLUTIDE(0.25 OR 0.5MG/DOS) 2 MG/1.5ML ~~LOC~~ SOPN: 0.2500 mg | PEN_INJECTOR | SUBCUTANEOUS | 0 refills | Status: DC

## 2021-05-26 MED ORDER — ALLOPURINOL 100 MG PO TABS: 200.0000 mg | ORAL_TABLET | Freq: Every day | ORAL | 3 refills | Status: DC

## 2021-05-26 MED ORDER — CLONIDINE HCL 0.1 MG PO TABS
0.1000 mg | ORAL_TABLET | Freq: Two times a day (BID) | ORAL | 11 refills | Status: DC
Start: 2021-05-26 — End: 2022-05-02

## 2021-05-26 MED ORDER — BENZOYL PEROXIDE WASH 5 % EX LIQD: Freq: Two times a day (BID) | CUTANEOUS | 12 refills | Status: DC

## 2021-05-26 NOTE — Progress Notes (Signed)
    SUBJECTIVE:   CHIEF COMPLAINT / HPI:   Andrew [name] presents to follow up on diabetes and blood pressure. He also reports he has been breaking out on his face around his mouth occurring for the past week. Has not tried anything for the acne. Denies pain or itching.  HTN: Last visit was advised to check his BP at home. He did not bring in a log today. Currently his BP is 145/79. Last clinic visit on 9/23 was also elevated at 158/66. Currently on amlodipine 10 mg and enalapril 40 mg daily  DM: last a1c 7.7  in August . Today 8.3.  Follows with podiatry who takes care of his foot exam  Currently on Metformin 1000mg  BID and Januvia 100mg  daily  Requests to substitute Januvia for a different medication    OBJECTIVE:   BP (!) 145/79   Pulse 83   Ht 6\' 1"  (1.854 m)   Wt 219 lb 9.6 oz (99.6 kg)   SpO2 100%   BMI 28.97 kg/m    Physical exam General: well appearing, NAD Cardiovascular: RRR, no murmurs Lungs: CTAB. Normal WOB Abdomen: soft, non-distended, non-tender Skin: warm, dry. No edema  ASSESSMENT/PLAN:   No problem-specific Assessment & Plan notes found for this encounter.  DM2 A1c 8.3 up from 7.7  in August. Discussed healthy eating with balanced meals - continue metformin 1000 mg BID - start Ozempic 0.25mg  weekly ( per chart review patient wants to avoid empaglifozin due to side effefcts)  - will forward to pharmacy team for assistance with teaching patient how to use Ozempic    HTN Currently his BP is 145/79. Last clinic visit on 9/23 was also elevated at 158/66. Did not bring in log from home. Currently on amlodipine 10 mg and enalapril 40 mg daily. Unable to tolerate Metoprolol due to low HR. Not starting thiazide due to gout. Will start Clonidine 01mg  BID - f/u or nurse BP check in a week  Perioral dermatitis  non erythematous papular rash likely due to to wearing his mask. No concern for infection. Recommended Benzoyl peroxide daily until It resolves    Andrew [name], [place]

## 2021-05-26 NOTE — Patient Instructions (Addendum)
It was great seeing you today!  Today you came in to check on your blood pressure and diabetes.  For your diabetes I want you to continue taking the metformin, and I am adding Ozempic which is a once weekly injectable medication.  I have also going to have pharmacy follow-up with you to help you with giving this injection.  For your blood pressure we are adding clonidine 0.1 mg twice a day  For your acne I recommend benzoyl peroxide daily until your break out resolves.   The name of my attending you can use for the medications is Dr. Chrisandra Netters, MD.   Please check-out at the front desk before leaving the clinic.  Schedule to see the nurse in 1 week for blood pressure check, and I'd like to see you back in about 3-4 weeks to check on new medication, but if you need to be seen earlier than that for any new issues we're happy to fit you in, just give [place] a call!  Visit Reminders: - Stop by the pharmacy to pick up your prescriptions  - Continue to work on your healthy eating habits and incorporating exercise into your daily life.   Feel free to call with any questions or concerns at any time, at [phone].   Take care,  Dr. [name] [facility]

## 2021-05-29 ENCOUNTER — Telehealth: Payer: Self-pay | Admitting: Podiatry

## 2021-05-29 NOTE — Telephone Encounter (Signed)
Patient called requesting refill on traMADol (ULTRAM) 50 MG tablet. States he 's experiencing a lot of pain from gout and his great toe. Please advise.

## 2021-05-30 ENCOUNTER — Telehealth: Payer: Self-pay

## 2021-05-30 MED ORDER — TRAMADOL HCL 50 MG PO TABS: 50.0000 mg | ORAL_TABLET | Freq: Three times a day (TID) | ORAL | 0 refills | Status: AC | PRN

## 2021-05-30 NOTE — Addendum Note (Signed)
Addended by: Boneta Lucks on: 05/30/2021 07:57 AM   Modules accepted: Orders

## 2021-05-30 NOTE — Telephone Encounter (Signed)
Patient calls nurse line regarding refill request for meloxicam. Patient reports that he will not be re established with [facility] until January and [facility] [address]. Pended request to this encounter.   Patient also wants to make provider aware that he will only be doing ozempic for this month. Patient reports that cost of medication is too expensive at $240.   Talbot Grumbling, RN

## 2021-06-05 ENCOUNTER — Ambulatory Visit: Payer: Medicare HMO

## 2021-06-05 ENCOUNTER — Other Ambulatory Visit: Payer: Self-pay

## 2021-06-05 ENCOUNTER — Other Ambulatory Visit: Payer: Self-pay | Admitting: Family Medicine

## 2021-06-05 VITALS — BP 118/62 | HR 71

## 2021-06-05 DIAGNOSIS — I10 Essential (primary) hypertension: Secondary | ICD-10-CM

## 2021-06-05 MED ORDER — MELOXICAM 15 MG PO TABS: 15.0000 mg | ORAL_TABLET | Freq: Every day | ORAL | 3 refills | Status: DC

## 2021-06-05 NOTE — Progress Notes (Signed)
Patient here today for BP check.      Last BP was on 05/26/2021 and was 145/79.  BP today is 118/62 with a pulse of 71.    Checked BP in left arm with large cuff.    Symptoms present: None.   Patient last took BP meds this morning. Amlodipine, Enalapril and Clonidine.  Patient has a FU apt scheduled for 12/23 with PCP.   Patient advised to call our office between now and then if he BPs at home are low or high.

## 2021-06-14 ENCOUNTER — Telehealth: Payer: Self-pay

## 2021-06-14 NOTE — Telephone Encounter (Signed)
Patient calls nurse line regarding side effects of Ozempic. Patient reports that he is experiencing GI upset. Patient reports diarrhea for the last four days and decreased appetite. Patient denies fever or flu symptoms.   Patient is requesting anti diarrheal to be sent to pharmacy.   Please advise.   Talbot Grumbling, RN

## 2021-06-16 NOTE — Telephone Encounter (Signed)
Called patient and informed of below. Patient reports that diarrhea has now stopped.   Patient will follow up with PCP on 12/23.  Talbot Grumbling, RN

## 2021-06-26 ENCOUNTER — Telehealth: Payer: Self-pay | Admitting: Podiatry

## 2021-06-26 MED ORDER — TRAMADOL HCL 50 MG PO TABS: 50.0000 mg | ORAL_TABLET | Freq: Three times a day (TID) | ORAL | 0 refills | Status: AC | PRN

## 2021-06-26 NOTE — Telephone Encounter (Signed)
Patient requesting a refill on Tramadol   [facility] [address]   Patient states he is having a problem with his broken toe and gout flare up

## 2021-06-30 ENCOUNTER — Ambulatory Visit: Payer: Medicare HMO | Admitting: Family Medicine

## 2021-07-03 ENCOUNTER — Other Ambulatory Visit: Payer: Self-pay | Admitting: Family Medicine

## 2021-07-09 ENCOUNTER — Other Ambulatory Visit: Payer: Self-pay | Admitting: Family Medicine

## 2021-07-13 ENCOUNTER — Other Ambulatory Visit: Payer: Self-pay | Admitting: Family Medicine

## 2021-07-13 DIAGNOSIS — E119 Type 2 diabetes mellitus without complications: Secondary | ICD-10-CM

## 2021-07-17 ENCOUNTER — Telehealth: Payer: Self-pay | Admitting: Podiatry

## 2021-07-17 ENCOUNTER — Other Ambulatory Visit: Payer: Self-pay | Admitting: Family Medicine

## 2021-07-17 ENCOUNTER — Telehealth: Payer: Self-pay

## 2021-07-17 MED ORDER — TRAMADOL HCL 50 MG PO TABS: 50.0000 mg | ORAL_TABLET | Freq: Three times a day (TID) | ORAL | 0 refills | Status: AC | PRN

## 2021-07-17 MED ORDER — SITAGLIPTIN PHOSPHATE 100 MG PO TABS: 100.0000 mg | ORAL_TABLET | Freq: Every day | ORAL | 0 refills | Status: DC

## 2021-07-17 NOTE — Telephone Encounter (Signed)
Patient calls nurse line requesting a refill on Januvia. Patient advised this is not on his current med list. Patient reports he has never stopped taking this medication. Patient reports he is no longer taking Ozempic due to GI upset. Patient is completely out and asking for a [facility] supply [facility] and a 90 day supply to [place]. Patient reports he is taking (1) 100mg  tab daily.   Patient has an upcoming apt with PCP on 1/16.

## 2021-07-17 NOTE — Telephone Encounter (Signed)
Patient called and stated that he needed a refill on Tramadol. He is still having pain/gout.

## 2021-07-17 NOTE — Addendum Note (Signed)
Addended by: Boneta Lucks on: 07/17/2021 02:29 PM   Modules accepted: Orders

## 2021-07-24 ENCOUNTER — Encounter: Payer: Self-pay | Admitting: Family Medicine

## 2021-07-24 ENCOUNTER — Other Ambulatory Visit: Payer: Self-pay

## 2021-07-24 ENCOUNTER — Ambulatory Visit (INDEPENDENT_AMBULATORY_CARE_PROVIDER_SITE_OTHER): Payer: Medicare HMO | Admitting: Family Medicine

## 2021-07-24 VITALS — BP 135/73 | HR 64 | Ht 73.0 in | Wt 215.4 lb

## 2021-07-24 DIAGNOSIS — E119 Type 2 diabetes mellitus without complications: Secondary | ICD-10-CM | POA: Diagnosis not present

## 2021-07-24 DIAGNOSIS — N182 Chronic kidney disease, stage 2 (mild): Secondary | ICD-10-CM | POA: Diagnosis not present

## 2021-07-24 MED ORDER — DAPAGLIFLOZIN PROPANEDIOL 5 MG PO TABS: 5.0000 mg | ORAL_TABLET | Freq: Every day | ORAL | 0 refills | Status: DC

## 2021-07-24 NOTE — Progress Notes (Signed)
° ° °  SUBJECTIVE:   CHIEF COMPLAINT / HPI:   Andrew [name] is a 66 yo who presents to discuss medications.  At last visit on 11/18 he was started on Ozempic for his diabetes to take in addition to the Metformin. I thought at the time he stopped the Januvia but he states he continued the Januvia as well. Ultimately he stopped taking the Ozempic due to side effects of nausea and diarrhea. His a1c 2 months ago was 8.3. He also reports checking his sugars daily. States that he feels tired in the middle of the night so he checks is sugars. Reports his fasting sugars at that time around 100 and he feels that is too low.   Additionally, admits to eating sweets and carb. Will typically have a roll or bagel in the morning.  States he really enjoys his cherry pies.    OBJECTIVE:   BP 135/73    Pulse 64    Ht 6\' 1"  (1.854 m)    Wt 215 lb 6.4 oz (97.7 kg)    SpO2 100%    BMI 28.42 kg/m    Physical exam  General: well appearing, NAD Cardiovascular: RRR, no murmurs Lungs: CTAB. Normal WOB Abdomen: soft, non-distended, non-tender Skin: warm, dry. No edema  ASSESSMENT/PLAN:   No problem-specific Assessment & Plan notes found for this encounter.  DM2 A1c 8.3 2 months ago. Did not check today. He is currently taking Metformin 1000mg  BID and Januvia  100mg  daily. He started Ozempic in November but could not tolerate it due to nausea and diarrhea. He essentially has been asymptomatic from his diabetes though does say he feels tired in the middle of the night and things its his blood sugar. I reassured him that his sugar of 100 is not concerning and that he is likely tired because he is waking up in the middle of his sleep. We also discussed not needing to check his sugar every day, and really doesn't need to check it at all unless he is symptomatic since he is not on insulin. Also discussed healthy eating, watching the desserts and simple carbs, eating those in moderation. Gave him the option to restart a GLP1  agonist and we could ensure he is giving himself the right dose, or add [place] and he opted for the latter.  - added [place] given he does have CKD - BMP - follow up in about 2 weeks to check on addition of med   Health maintenance  - sent in Shingrix to pharmacy - up to date on [place], [place]

## 2021-07-24 NOTE — Patient Instructions (Signed)
It was great seeing you today!  Today we added Farxiga 5mg  daily to your diabetes regimen. Continue taking Metformin twice a day and Januvia. Also remember you do not need to check your blood sugars daily.   Please check-out at the front desk before leaving the clinic. I'd like to see you back in 2-4 weeks to check on medication, but if you need to be seen earlier than that for any new issues we're happy to fit you in, just give [place] a call!  Feel free to call with any questions or concerns at any time, at [phone].   Take care,  Dr. [name] [facility]

## 2021-07-25 LAB — BASIC METABOLIC PANEL
BUN/Creatinine Ratio: 14 (ref 10–24)
BUN: 16 mg/dL (ref 8–27)
CO2: 21 mmol/L (ref 20–29)
Calcium: 8.9 mg/dL (ref 8.6–10.2)
Chloride: 95 mmol/L — ABNORMAL LOW (ref 96–106)
Creatinine, Ser: 1.16 mg/dL (ref 0.76–1.27)
Glucose: 250 mg/dL — ABNORMAL HIGH (ref 70–99)
Potassium: 4.5 mmol/L (ref 3.5–5.2)
Sodium: 131 mmol/L — ABNORMAL LOW (ref 134–144)
eGFR: 70 mL/min/{1.73_m2} (ref >59)

## 2021-07-25 MED ORDER — ZOSTER VAC RECOMB ADJUVANTED 50 MCG/0.5ML IM SUSR: 0.5000 mL | Freq: Once | INTRAMUSCULAR | 0 refills | Status: AC

## 2021-07-26 ENCOUNTER — Other Ambulatory Visit: Payer: Self-pay

## 2021-07-27 MED ORDER — SITAGLIPTIN PHOSPHATE 100 MG PO TABS: 100.0000 mg | ORAL_TABLET | Freq: Every day | ORAL | 0 refills | Status: DC

## 2021-08-07 ENCOUNTER — Telehealth: Payer: Self-pay

## 2021-08-07 NOTE — Telephone Encounter (Signed)
Patient calls nurse line reporting concerns with Farixga. Patient reports he started this medication on Thursday, however his sugars have been running high. Patient was unable to give me values. Patient reports he also read about the medication and has concerns for his kidney function. Patient denied any feelings of hyperglycemic episodes over there weekend.   Patient scheduled for 2/7 with PCP to discuss. Red flags discussed with patient in the meantime. Patient declined to see another provider sooner.

## 2021-08-08 ENCOUNTER — Ambulatory Visit: Payer: Medicare HMO | Admitting: Student

## 2021-08-15 ENCOUNTER — Other Ambulatory Visit: Payer: Self-pay

## 2021-08-15 ENCOUNTER — Other Ambulatory Visit: Payer: Self-pay | Admitting: Family Medicine

## 2021-08-15 ENCOUNTER — Ambulatory Visit (INDEPENDENT_AMBULATORY_CARE_PROVIDER_SITE_OTHER): Payer: Medicare HMO | Admitting: Family Medicine

## 2021-08-15 ENCOUNTER — Telehealth: Payer: Self-pay | Admitting: Family Medicine

## 2021-08-15 ENCOUNTER — Encounter: Payer: Self-pay | Admitting: Family Medicine

## 2021-08-15 VITALS — BP 138/78 | HR 89 | Ht 73.0 in | Wt 206.6 lb

## 2021-08-15 DIAGNOSIS — E119 Type 2 diabetes mellitus without complications: Secondary | ICD-10-CM | POA: Diagnosis not present

## 2021-08-15 DIAGNOSIS — R0981 Nasal congestion: Secondary | ICD-10-CM

## 2021-08-15 LAB — POCT GLYCOSYLATED HEMOGLOBIN (HGB A1C): HbA1c, POC (controlled diabetic range): 10.5 % — AB (ref 0.0–7.0)

## 2021-08-15 MED ORDER — DAPAGLIFLOZIN PROPANEDIOL 10 MG PO TABS: 10.0000 mg | ORAL_TABLET | Freq: Every day | ORAL | 3 refills | Status: DC

## 2021-08-15 MED ORDER — FLUTICASONE PROPIONATE 50 MCG/ACT NA SUSP: 2.0000 | Freq: Every day | NASAL | 6 refills | Status: DC

## 2021-08-15 NOTE — Progress Notes (Signed)
° ° °  SUBJECTIVE:   CHIEF COMPLAINT / HPI:   Andrew [name] is a 66 yo who presents for follow up to discuss diabetes medication.   He was seen on 1/17, and was started on Farxiga, and was advised to follow-up in about 2 weeks to check kidney function. A1c 8.3 3 months ago, he has still been taking metformin 1000 mg daily. Stopped taking Januvia for 2 weeks, is still taking [place].  He feels that the Januvia may be causing his increased congestion, though he has been stable on his Januvia for a while. Has been urinating and feeling more thirsty. States sugars at home have been in the 300s, 400s and even reached 500. Did not bring his log in today. He endorses walking around [place] for exercise   He requests medication for his stuffy nose. Has been having congestion for the past 2 weeks. Denies coughing, fever.     OBJECTIVE:   Ht 5\' 9"  (1.753 m)    BMI 31.81 kg/m    Physical exam General: well appearing, NAD Cardiovascular: RRR, no murmurs Lungs: CTAB. Normal WOB Abdomen: soft, non-distended, non-tender Skin: warm, dry. No edema  ASSESSMENT/PLAN:   No problem-specific Assessment & Plan notes found for this encounter.   T2DM A1c today elevated at 10.5, up from 8.3 in November.  He stopped taking his Januvia, and has still been taking his metformin and was started on Farxiga 5 mg in January of this year.  Today he states his blood sugars have been elevated at home up to 500. Endorses polyuria and polydipsia.  Advised patient to continue taking his Januvia, metformin, and we increased Farxiga to 10 mg.  So discussed decreasing the amount of sweets and carbs he is eating, and incorporating more vegetables with each meal.  Also encouraged continued walking.  We will check his kidney function today after starting [place].  We will follow-up with him in 3 months to recheck A1c.  Nasal congestion Patient with 2 weeks of increased nasal congestion, without fevers, coughing or any other symptoms.   Potentially allergies versus a viral infection. Prescribed Flonase to use daily.  [place]

## 2021-08-15 NOTE — Telephone Encounter (Signed)
Patient dropped off form at front desk for [place] Provider Form.  Verified that patient section of form has been completed.  Last DOS/WCC with PCP was 08/15/21.  Placed form in red team folder to be completed by clinical staff.  Creig Hines

## 2021-08-15 NOTE — Patient Instructions (Addendum)
It was great seeing you today!  You came in to follow-up on your diabetes, and since your A1c has increased to 10.5, I want you to start back your Januvia, we also increased her [place] Glade from 5 mg to 10 mg, and continue taking your metformin.  Also be sure to cut down on your desserts, carbs, and incorporate vegetables with each meal.    We are also checking your kidney function today to if anything is abnormal I will call you, otherwise I will send you a message.  For your congestion I have prescribed Flonase to use in each nostril daily.  Please check-out at the front desk before leaving the clinic. I'd like to see you back in 3 months to follow-up on your diabetes, but if you need to be seen earlier than that for any new issues we're happy to fit you in, just give [place] a call!  Feel free to call with any questions or concerns at any time, at [phone].   Take care,  Dr. [name] [facility]

## 2021-08-16 LAB — BASIC METABOLIC PANEL
BUN/Creatinine Ratio: 13 (ref 10–24)
BUN: 23 mg/dL (ref 8–27)
CO2: 22 mmol/L (ref 20–29)
Calcium: 9.5 mg/dL (ref 8.6–10.2)
Chloride: 95 mmol/L — ABNORMAL LOW (ref 96–106)
Creatinine, Ser: 1.75 mg/dL — ABNORMAL HIGH (ref 0.76–1.27)
Glucose: 365 mg/dL — ABNORMAL HIGH (ref 70–99)
Potassium: 4 mmol/L (ref 3.5–5.2)
Sodium: 133 mmol/L — ABNORMAL LOW (ref 134–144)
eGFR: 43 mL/min/{1.73_m2} — ABNORMAL LOW (ref >59)

## 2021-08-16 NOTE — Telephone Encounter (Signed)
Reviewed forms and placed in PCP's box for completion.   .Jaice Digioia R Johonna Binette, CMA  

## 2021-08-22 ENCOUNTER — Telehealth: Payer: Self-pay | Admitting: Family Medicine

## 2021-08-22 NOTE — Telephone Encounter (Signed)
Reviewed forms and placed in PCP's box for completion.   .Akima Slaugh R Dannell Raczkowski, CMA  

## 2021-08-22 NOTE — Telephone Encounter (Signed)
Patient dropped off medication assistance for to be completed. Last DOS was 08/15/21. Placed in [employer].

## 2021-08-23 NOTE — Telephone Encounter (Signed)
Form faxed to provided number. Copy made and placed in batch scanning.   Talbot Grumbling, RN

## 2021-08-29 ENCOUNTER — Other Ambulatory Visit: Payer: Self-pay | Admitting: Family Medicine

## 2021-08-29 DIAGNOSIS — N182 Chronic kidney disease, stage 2 (mild): Secondary | ICD-10-CM

## 2021-08-30 ENCOUNTER — Other Ambulatory Visit: Payer: Self-pay

## 2021-08-30 ENCOUNTER — Telehealth: Payer: Self-pay | Admitting: *Deleted

## 2021-08-30 ENCOUNTER — Other Ambulatory Visit: Payer: Medicare HMO

## 2021-08-30 ENCOUNTER — Telehealth: Payer: Self-pay | Admitting: Podiatry

## 2021-08-30 DIAGNOSIS — N182 Chronic kidney disease, stage 2 (mild): Secondary | ICD-10-CM

## 2021-08-30 NOTE — Telephone Encounter (Signed)
Pt called again to see if his medication was called in. I did not see it.

## 2021-08-30 NOTE — Telephone Encounter (Signed)
-----   Message from [name], DO sent at 08/29/2021  5:26 PM EST ----- Hey can we get this patient scheduled for a lab visit to repeat his BMP to check his kidney function? Thank you!  ----- Message ----- From: [place] Pink, CMA Sent: 08/15/2021   9:13 AM EST To: [name], DO

## 2021-08-30 NOTE — Telephone Encounter (Signed)
Pt called asking for a refill on his tramadol send [facility] to [facility] [address].

## 2021-08-30 NOTE — Telephone Encounter (Signed)
Pt informed and scheduled for today. Sherilyn Windhorst Kennon Holter, CMA

## 2021-08-31 ENCOUNTER — Telehealth: Payer: Self-pay | Admitting: Family Medicine

## 2021-08-31 DIAGNOSIS — E875 Hyperkalemia: Secondary | ICD-10-CM

## 2021-08-31 LAB — BASIC METABOLIC PANEL
BUN/Creatinine Ratio: 10 (ref 10–24)
BUN: 16 mg/dL (ref 8–27)
CO2: 24 mmol/L (ref 20–29)
Calcium: 9.5 mg/dL (ref 8.6–10.2)
Chloride: 93 mmol/L — ABNORMAL LOW (ref 96–106)
Creatinine, Ser: 1.67 mg/dL — ABNORMAL HIGH (ref 0.76–1.27)
Glucose: 406 mg/dL — ABNORMAL HIGH (ref 70–99)
Potassium: 5.4 mmol/L — ABNORMAL HIGH (ref 3.5–5.2)
Sodium: 131 mmol/L — ABNORMAL LOW (ref 134–144)
eGFR: 45 mL/min/{1.73_m2} — ABNORMAL LOW (ref >59)

## 2021-08-31 NOTE — Telephone Encounter (Signed)
Left message for pt to call to schedule an appt so we can refill his medication.

## 2021-08-31 NOTE — Telephone Encounter (Signed)
Called patient to discuss lab results. Potassium slightly elevated. Possibly spurious due to lab errors in our clinic.  I have placed future order for BMP to be collected at [facility] at [address]. He will plan to go tomorrow morning.  Jarrett Soho, can you please make sure order gets sent to [facility]?

## 2021-09-01 ENCOUNTER — Other Ambulatory Visit: Payer: Self-pay | Admitting: Family Medicine

## 2021-09-02 LAB — BASIC METABOLIC PANEL
BUN/Creatinine Ratio: 12 (ref 10–24)
BUN: 21 mg/dL (ref 8–27)
CO2: 26 mmol/L (ref 20–29)
Calcium: 9.5 mg/dL (ref 8.6–10.2)
Chloride: 94 mmol/L — ABNORMAL LOW (ref 96–106)
Creatinine, Ser: 1.82 mg/dL — ABNORMAL HIGH (ref 0.76–1.27)
Glucose: 388 mg/dL — ABNORMAL HIGH (ref 70–99)
Potassium: 4.7 mmol/L (ref 3.5–5.2)
Sodium: 135 mmol/L (ref 134–144)
eGFR: 41 mL/min/{1.73_m2} — ABNORMAL LOW (ref >59)

## 2021-09-03 ENCOUNTER — Other Ambulatory Visit: Payer: Self-pay | Admitting: Family Medicine

## 2021-09-06 ENCOUNTER — Other Ambulatory Visit: Payer: Self-pay | Admitting: Family Medicine

## 2021-09-12 NOTE — Progress Notes (Signed)
Received notification from [employer] regarding approval for [facility]. Patient assistance approved until 07/08/21. ? ?Phone: [phone] ? ?

## 2021-09-13 ENCOUNTER — Ambulatory Visit: Payer: Medicare PPO | Admitting: Podiatry

## 2021-09-13 ENCOUNTER — Other Ambulatory Visit: Payer: Self-pay

## 2021-09-13 DIAGNOSIS — E1142 Type 2 diabetes mellitus with diabetic polyneuropathy: Secondary | ICD-10-CM | POA: Diagnosis not present

## 2021-09-13 DIAGNOSIS — M7752 Other enthesopathy of left foot: Secondary | ICD-10-CM | POA: Diagnosis not present

## 2021-09-13 DIAGNOSIS — M19072 Primary osteoarthritis, left ankle and foot: Secondary | ICD-10-CM

## 2021-09-13 MED ORDER — TRAMADOL HCL 50 MG PO TABS: 50.0000 mg | ORAL_TABLET | Freq: Three times a day (TID) | ORAL | 0 refills | Status: AC | PRN

## 2021-09-14 NOTE — Progress Notes (Signed)
Subjective:  Patient ID: Andrew [name], male    DOB: 02/13/1956,  MRN: [id number]  Chief Complaint  Patient presents with   Foot Pain    66 y.o. male presents with the above complaint.  Patient presents with a follow-up of left first metatarsophalangeal joint pain.  He states he still occasionally gets pain.  He states that he had to complete his tramadol course.  He just would like to know if he can get more tramadol for as needed pain.  Review of Systems: Negative except as noted in the HPI. Denies N/V/F/Ch.  Past Medical History:  Diagnosis Date   Arthritis    Chronic left shoulder pain 09/20/2017   CKD (chronic kidney disease) stage 3, GFR 30-59 ml/min (HCC) 09/05/2006   CRD (chronic renal disease), stage II    Baseline Cr 1.2   CVA (cerebral vascular accident) ([place])    x4 last one in 2007, R sided residual weakness   DSORD, ADJST W/MIXED ANXIETY/DEPRESSED MOOD 10/18/2006   Qualifier: Diagnosis of  By: Dalbert Mayotte     ERECTILE DYSFUNCTION 10/18/2006   Qualifier: Diagnosis of  By: Dalbert Mayotte     HAMMER TOE 07/25/2010   HLD (hyperlipidemia)    HTN (hypertension)    Hypothyroidism    Leg swelling    Lipoma of back 03/18/2011   Personal history of colonic adenomas 04/01/2013   Primary osteoarthritis of right knee 05/21/2018   Prostatitis    hx of x2   PSEUDOFOLLICULITIS BARBAE 4/0/3709   Qualifier: Diagnosis of  By: Danise Mina  MD, Garlon Hatchet     Sarcoidosis    Status post right knee replacement 05/15/2017   T2DM (type 2 diabetes mellitus) ([place])     Current Outpatient Medications:    traMADol (ULTRAM) 50 MG tablet, Take 1 tablet (50 mg total) by mouth every 8 (eight) hours as needed for up to 5 days., Disp: 15 tablet, Rfl: 0   Acetaminophen (TYLENOL ARTHRITIS EXT RELIEF PO), Take 650 mg by mouth 1 day or 1 dose., Disp: , Rfl:    Alcohol Swabs PADS, Please use prior to checking blood sugar levels up to two times daily., Disp: 120 each, Rfl: 3   allopurinol  (ZYLOPRIM) 100 MG tablet, Take 2 tablets (200 mg total) by mouth daily., Disp: 180 tablet, Rfl: 3   amLODipine (NORVASC) 10 MG tablet, Take 1 tablet (10 mg total) by mouth daily., Disp: 90 tablet, Rfl: 2   aspirin EC 81 MG tablet, Take 1 tablet (81 mg total) by mouth daily., Disp: 90 tablet, Rfl: 2   benzoyl peroxide 5 % external liquid, Apply topically 2 (two) times daily., Disp: 142 g, Rfl: 12   Blood Glucose Calibration (TRUE METRIX LEVEL 1) Low SOLN, Please use to calibrate glucometer., Disp: 1 each, Rfl: 3   Blood Glucose Calibration (TRUE METRIX LEVEL 2) Normal SOLN, Please use to calibrate glucometer., Disp: 1 each, Rfl: 3   Blood Glucose Calibration (TRUE METRIX LEVEL 3) High SOLN, Please use to calibrate glucometer., Disp: 1 each, Rfl: 3   Blood Glucose Monitoring Suppl (ONETOUCH VERIO) w/Device KIT, Please use to check blood sugar up to 5 times daily. E11.9, Disp: 1 kit, Rfl: 0   cholecalciferol (VITAMIN D) 25 MCG (1000 UNIT) tablet, TAKE 1 TABLET EVERY DAY, Disp: 90 tablet, Rfl: 3   cloNIDine (CATAPRES) 0.1 MG tablet, Take 1 tablet (0.1 mg total) by mouth 2 (two) times daily., Disp: 60 tablet, Rfl: 11   Continuous Blood Gluc Sensor (DEXCOM G6  SENSOR) MISC, Inject 1 applicator into the skin as directed. Change sensor every 10 days., Disp: 3 each, Rfl: 11   Continuous Blood Gluc Transmit (DEXCOM G6 TRANSMITTER) MISC, Inject 1 Device into the skin as directed. Reuse 8 times with sensor changes., Disp: 1 each, Rfl: 3   dapagliflozin propanediol (FARXIGA) 10 MG TABS tablet, Take 1 tablet (10 mg total) by mouth daily., Disp: 90 tablet, Rfl: 3   enalapril (VASOTEC) 20 MG tablet, TAKE 2 TABLETS EVERY DAY, Disp: 180 tablet, Rfl: 3   fluticasone (FLONASE) 50 MCG/ACT nasal spray, Place 2 sprays into both nostrils daily., Disp: 16 g, Rfl: 6   gabapentin (NEURONTIN) 100 MG capsule, Take 1 capsule (100 mg total) by mouth at bedtime., Disp: 30 capsule, Rfl: 5   Ginger, Zingiber officinalis, (GINGER PO),  Take 1 each by mouth daily. Boils fresh ginger and drinks as tea., Disp: , Rfl:    glucose blood (ONETOUCH VERIO) test strip, Please use to check blood sugar up to 5 times daily. E11.9, Disp: 200 each, Rfl: 12   JANUVIA 100 MG tablet, TAKE 1 TABLET BY MOUTH EVERY DAY, Disp: 30 tablet, Rfl: 3   latanoprost (XALATAN) 0.005 % ophthalmic solution, Place 1 drop into both eyes at bedtime., Disp: , Rfl:    levothyroxine (SYNTHROID) 75 MCG tablet, TAKE 1 TABLET EVERY DAY BEFORE BREAKFAST, Disp: 90 tablet, Rfl: 3   meloxicam (MOBIC) 15 MG tablet, TAKE 1 TABLET EVERY DAY, Disp: 30 tablet, Rfl: 0   metFORMIN (GLUCOPHAGE-XR) 500 MG 24 hr tablet, Take 2 tablets (1,000 mg total) by mouth 2 (two) times daily., Disp: 360 tablet, Rfl: 2   OneTouch Delica Lancets 99M MISC, Please use to check blood sugar up to 5 times daily. E11.9, Disp: 200 each, Rfl: 12   rosuvastatin (CRESTOR) 20 MG tablet, TAKE 1 TABLET (20 MG TOTAL) BY MOUTH DAILY., Disp: 90 tablet, Rfl: 3   tamsulosin (FLOMAX) 0.4 MG CAPS capsule, TAKE 2 CAPSULES EVERY DAY, Disp: 180 capsule, Rfl: 3   traMADol (ULTRAM) 50 MG tablet, Take 1 tablet (50 mg total) by mouth every 8 (eight) hours as needed., Disp: 15 tablet, Rfl: 0   Turmeric POWD, Take 5 mLs by mouth daily. Mixes 1 teaspoon in water every day and drinks it. Uses for arthritis., Disp: , Rfl:   Social History   Tobacco Use  Smoking Status Never  Smokeless Tobacco Never  Tobacco Comments   Never smoker; no plans to start    Allergies  Allergen Reactions   Metoprolol Succinate Other (See Comments)    Bradycardia (HR to 42)   Penicillins Itching    Has patient had a PCN reaction causing immediate rash, facial/tongue/throat swelling, SOB or lightheadedness with hypotension: No Has patient had a PCN reaction causing severe rash involving mucus membranes or skin necrosis: No Has patient had a PCN reaction that required hospitalization No Has patient had a PCN reaction occurring within the last  10 years: No If all of the above answers are "NO", then may proceed with Cephalosporin use.   Empagliflozin Other (See Comments)    Dizzy , Dysphoria,  Patient preference to avoid use.    Objective:  There were no vitals filed for this visit. There is no height or weight on file to calculate BMI. Constitutional Well developed. Well nourished.  Vascular Dorsalis pedis pulses palpable bilaterally. Posterior tibial pulses palpable bilaterally. Capillary refill normal to all digits.  No cyanosis or clubbing noted. Pedal hair growth normal.  Neurologic Normal  speech. Oriented to person, place, and time. Epicritic sensation to light touch grossly present bilaterally.  Dermatologic Nails well groomed and normal in appearance. No open wounds. No skin lesions.  Orthopedic: Very mild pain on palpation to the left first metatarsophalangeal joint.  Mild pain with range of motion of the first MPJ.  Mild intra-articular pain noted.  Mild pain on palpation of the right first metatarsophalangeal joint mild pain on palpation to the joint. Very mild pain on palpation to the left second toe at the level of the DIPJ joint.  No pain with range of motion of either joints.   Radiographs: 3 views of skeletally mature adult bilateral foot: Osteoarthritic changes noted to the midfoot bilaterally.  Osteoarthrosis noted to the first metatarsophalangeal joint bilaterally with left greater than right side.  Generalized osteopenia noted.  No other bony abnormalities noted.  Severe pes planus foot structure noted as well. Assessment:   1. Diabetic polyneuropathy associated with type 2 diabetes mellitus ([place])   2. Osteoarthritis of first metatarsophalangeal (MTP) joint of left foot   3. Capsulitis of metatarsophalangeal (MTP) joint of left foot      Plan:  Patient was evaluated and treated and all questions answered.  Left first metatarsophalangeal joint capsulitis with underlying osteoarthritis left greater than  right -Clinically doing better today.  At this time I will hold off on further steroid injection as he is doing better. -Tramadol was dispensed for pain control. -Gabapentin for pain control  Left second digit mild contusion -I explained to the patient the etiology of mild contusion various treatment options were discussed.  Given that this is very mild in nature without any acute pain I discussed with him that he can just benefit from anti-inflammatory.  Mobic was dispensed to help with mild pain that he is having to the second digit.  Patient agrees with the plan.  At this time I am not concerned for any kind of fracture as he has not had any kind of trauma to that toe.   No follow-ups on file.

## 2021-09-15 ENCOUNTER — Other Ambulatory Visit: Payer: Self-pay | Admitting: Family Medicine

## 2021-09-18 ENCOUNTER — Ambulatory Visit: Payer: Medicare HMO | Admitting: Family Medicine

## 2021-09-20 ENCOUNTER — Other Ambulatory Visit: Payer: Self-pay | Admitting: Family Medicine

## 2021-09-20 DIAGNOSIS — I10 Essential (primary) hypertension: Secondary | ICD-10-CM

## 2021-09-26 ENCOUNTER — Ambulatory Visit (INDEPENDENT_AMBULATORY_CARE_PROVIDER_SITE_OTHER): Payer: Medicare PPO | Admitting: Family Medicine

## 2021-09-26 ENCOUNTER — Other Ambulatory Visit: Payer: Self-pay

## 2021-09-26 VITALS — BP 124/64 | HR 77 | Ht 73.0 in | Wt 203.2 lb

## 2021-09-26 DIAGNOSIS — M79672 Pain in left foot: Secondary | ICD-10-CM | POA: Diagnosis not present

## 2021-09-26 DIAGNOSIS — M1A9XX0 Chronic gout, unspecified, without tophus (tophi): Secondary | ICD-10-CM

## 2021-09-26 NOTE — Patient Instructions (Addendum)
Get your foot xray:  ? ?A ?[facility] ?[place] [facility] ? [phone] ?Open ? Closes 5:30PM ? ?B ?[facility] ?[place] ? [phone] ?Open ? Closes 5PM ? ?It was great seeing you today! ? ?Continue taking Mobic and Tramadol for pain as needed  ? ?Visit Remembers: ?- Continue to work on your healthy eating habits and incorporating exercise into your daily life.  ?- Follow up with Dr. Posey Pronto  ? ? ?Regarding lab work today:  ?Due to recent changes in healthcare laws, you may see the results of your imaging and laboratory studies on MyChart before your provider has had a chance to review them.  I understand that in some cases there may be results that are confusing or concerning to you. Not all laboratory results come back in the same time frame and you may be waiting for multiple results in order to interpret others.  Please give [place] 72 hours in order for your provider to thoroughly review all the results before contacting the office for clarification of your results. If everything is normal, you will get a letter in the mail or a message in My Chart. Please give [place] a call if you do not hear from [place] after 2 weeks. ? ?Please bring all of your medications with you to each visit.  ? ?Feel free to call with any questions or concerns at any time, at [phone]. ?  ?Take care,  ?Dr. Susa Simmonds ?[place]  ? ?

## 2021-09-26 NOTE — Progress Notes (Signed)
? ?  SUBJECTIVE:  ? ?CHIEF COMPLAINT / HPI:  ? ? ? ?Andrew [name] is a 66 y.o. male here for left lateral foot pain that radiates to his toes.  States he stubbed his toe 2 weeks ago.  Scraped his big toe in the garden.  Notes pain acutely worsened this morning after getting up.  He is concerned that he may have gout.  Has history of gout not been eating red meat, seafood, or drinking alcohol.  Notes he has been consuming large amounts of mixed nuts this past weekend.  Initially stated he is taking nothing for this however reports taking tramadol and Mobic during his med rec.  Says this is not helping his pain.  He has seen his podiatrist last week.  He would like to know what is going on with his foot. ? ?PERTINENT  PMH / PSH: reviewed and updated as appropriate  ? ?OBJECTIVE:  ? ?BP 124/64   Pulse 77   Ht '6\' 1"'$  (1.854 m)   Wt 203 lb 4 oz (92.2 kg)   SpO2 98%   BMI 26.82 kg/m?   ? ?GEN: well appearing male in no acute distress  ?CVS: well perfused  ?RESP: speaking in full sentences without pause, no respiratory distress  ?MSK: left foot ?Inspection: Mild erythema of great toe with abrasion (see image below), ?Mild edema 1st great toe, no ecchymosis, no tophi, 2nd DIP joint claw deformity ?Palpation: Tenderness to the base of the fifth metatarsal and along the shaft, no medial calcaneal tuberosity tenderness, ?ROM: Full active and passive range of motion ?Strength: 5/5 in all directions ?No ligamentous laxity ?Antalgic gait ?-Neurovascularly intact, no instability noted ? ? ? ?ASSESSMENT/PLAN:  ? ?Left Foot Pain  ?Patient is a poor historian. Reported no history of gout.  On med rec he is taking allopurinol.  On chart review, has history of chronic gout. Exam less concerning for gout though will obtain uric acid.  Has history of left 1st MTP osteoarthritis however pain is lateral and not medial. Given recent history of injury in a garden. Obtain CBC to assess of infection.  ? ?Denied history of foot pain  but per podiatry notes, has history of subacute appearing comminuted non displaced fracture proximal left second toe and left first metatarsophalangeal joint capsulitis. This could be a source of pain though not lateral near the base of the 5th metatarsal base. Obtain foot xray to better evaluate fracture. Advised to start Tylenol for additional pain relief. Continue Tramadol prn and Mobic daily. Recommended he follow-up with his podiatrist, Dr. Posey Pronto. ? ? ?Lyndee Hensen, DO ?PGY-3, [facility] ?09/26/2021  ? ? ? ? ? ? ? ? ?

## 2021-09-27 ENCOUNTER — Ambulatory Visit
Admission: RE | Admit: 2021-09-27 | Discharge: 2021-09-27 | Disposition: A | Discharge status: Home / Self Care | Payer: Medicare PPO | Source: Ambulatory Visit | Attending: Family Medicine | Admitting: Family Medicine

## 2021-09-27 DIAGNOSIS — M79672 Pain in left foot: Secondary | ICD-10-CM

## 2021-09-27 LAB — CBC
Hematocrit: 43.1 % (ref 37.5–51.0)
Hemoglobin: 14.1 g/dL (ref 13.0–17.7)
MCH: 30.9 pg (ref 26.6–33.0)
MCHC: 32.7 g/dL (ref 31.5–35.7)
MCV: 94 fL (ref 79–97)
Platelets: 244 10*3/uL (ref 150–450)
RBC: 4.57 x10E6/uL (ref 4.14–5.80)
RDW: 13.2 % (ref 11.6–15.4)
WBC: 7.8 10*3/uL (ref 3.4–10.8)

## 2021-09-27 LAB — URIC ACID: Uric Acid: 5.3 mg/dL (ref 3.8–8.4)

## 2021-10-02 ENCOUNTER — Telehealth: Payer: Self-pay | Admitting: Podiatry

## 2021-10-02 ENCOUNTER — Other Ambulatory Visit: Payer: Self-pay | Admitting: Podiatry

## 2021-10-02 MED ORDER — TRAMADOL HCL 50 MG PO TABS: 50.0000 mg | ORAL_TABLET | Freq: Three times a day (TID) | ORAL | 0 refills | Status: DC | PRN

## 2021-10-02 MED ORDER — GABAPENTIN 100 MG PO CAPS: 100.0000 mg | ORAL_CAPSULE | Freq: Every day | ORAL | 1 refills | Status: DC

## 2021-10-02 NOTE — Telephone Encounter (Signed)
Ph[facility] : [facility] [address]    Medication traMADol (ULTRAM) 50 MG tablet gabapentin (NEURONTIN) 100 MG capsule  Patient called for refills of his medication . They did xray on his left foot and found 2 fractures , he would like you to review xrays

## 2021-10-02 NOTE — Telephone Encounter (Signed)
Pt needs to come in for appt w/ Dr. Posey Pronto as soon as possible to review xrays. Refills sent. - Dr. Amalia Hailey

## 2021-10-03 ENCOUNTER — Telehealth: Payer: Self-pay | Admitting: Podiatry

## 2021-10-03 NOTE — Telephone Encounter (Signed)
Pt states he has a fracture on his left side of foot per xrays. He states he can walk on it with little to no pain and taking tramadol pain medication. He would like to know if it is safe to walk on it and can leave the house to go out to the store. He has a follow up appt scheduled on 4/14 with Dr. Posey Pronto. ? ?Please advise. ?

## 2021-10-03 NOTE — Telephone Encounter (Signed)
Based on xrays he really should not put any weight on the foot.  It could get worse if he walks all over it.  Recommend minimal weightbearing in a cam boot until follow-up appointment on 4/14 with Dr. Posey Pronto and at that time he will get follow-up x-rays.  Please notify patient.  Thanks, Dr. Amalia Hailey

## 2021-10-03 NOTE — Telephone Encounter (Signed)
Left message on voice mail for patient to call back for an appointment.

## 2021-10-04 NOTE — Telephone Encounter (Signed)
Pt was notified and made aware of recommendations per Dr Amalia Hailey. I advise him of purchasing a cam boot here in the office, he decline at this time due to financial status. He states he has a hard shoe boot that he can possibly wear in the meantime but will try not to put a lot of weight on his foot. He confirms that he will follow up at his next appt with Dr Posey Pronto. ?

## 2021-10-11 ENCOUNTER — Other Ambulatory Visit: Payer: Self-pay

## 2021-10-11 ENCOUNTER — Ambulatory Visit: Payer: Medicare Other | Admitting: Family Medicine

## 2021-10-11 ENCOUNTER — Encounter: Payer: Self-pay | Admitting: Family Medicine

## 2021-10-11 DIAGNOSIS — S99192D Other physeal fracture of left metatarsal, subsequent encounter for fracture with routine healing: Secondary | ICD-10-CM

## 2021-10-11 DIAGNOSIS — N529 Male erectile dysfunction, unspecified: Secondary | ICD-10-CM | POA: Diagnosis not present

## 2021-10-11 NOTE — Patient Instructions (Addendum)
It was great seeing you today! ? ?Im glad to see your foot is improving, make sure to follow up with Ortho at your scheduled appointment ? ?Please check-out at the front desk before leaving the clinic. I'd like to see you back in May to check you a1c and follow up on medications,  but if you need to be seen earlier than that for any new issues we're happy to fit you in, just give [place] a call! ? ?Please make sure to bring your meds next visit.  ? ?Feel free to call with any questions or concerns at any time, at [phone]. ?  ?Take care,  ?Dr. [name] ?[place]  ?

## 2021-10-11 NOTE — Progress Notes (Signed)
? ? ?  SUBJECTIVE:  ? ?CHIEF COMPLAINT / HPI:  ? ?Andrew [name] is a 66 yo who presents for follow up. He states he does not have any particular concerns and wasn't quite sure why this appt was scheduled   ? ?He was seen on 3/21 for left lateral foot pain and was found to have jones fracture at base of 5th metatarsal, comminuted fracture of the proximal phalanx of the great toe with ?extension to the articular surface at the interphalangeal joint, extensive atherosclerotic vascular calcifications in the medium and small vessels. ? ?Today he states his pain and swelling have both improved. Has been taking Tramadol as needed for his pain. He was seen by Ortho for his fracture and he has follow up scheduled with them ? ?Additionally, he states he wants Cialis but will hold off until new insurance comes in  ?Able to walk up and down steps without issues, walks  without getting out of breath  ? ? ?OBJECTIVE:  ? ?BP (!) 141/81   Pulse 85   Wt 204 lb 9.6 oz (92.8 kg)   SpO2 100%   BMI 26.99 kg/m?   ? ?General: alert, pleasant, NAD ?CV: RRR no murmurs ?Resp: CTAB normal WOB ?GI: soft, non distended  ?Derm: Warm, dry. No LE edema ? ?ASSESSMENT/PLAN:  ? ?Erectile dysfunction ?Interested in [place] prescription. Has used both Viagra and Cialis in the past and has tolerated it well. Does not seem to have contraindications. Discussed risks and not using with other nitrates. He expressed understanding but decided to wait until his insurance changes to get the Rx. Will let me know if he wants me to send in Rx.  ? ?Jones fracture ?Currently being followed by Ortho. Pain and swelling improved. Advised to follow up with [place] as needed ?  ?Recommended patient return end of May to check A1c and advised him to bring all his meds with him at this time so we can do a detailed med rec ? ?[name], DO ?[place]   ?

## 2021-10-12 ENCOUNTER — Telehealth: Payer: Self-pay | Admitting: *Deleted

## 2021-10-12 NOTE — Telephone Encounter (Signed)
Patient calls to let Dr. Arby Barrette know that he needs 2 cialis pills called into his pharmacy.  ? ?If the patient needs: ? ? An appointment - route response to [facility] admin ? A callback from clinic staff - route response to your team ? ? ?Only route to RN Team: form completions or if RN team directly requests response sent to them ? ? ?Christen Bame, CMA ? ?

## 2021-10-13 DIAGNOSIS — N529 Male erectile dysfunction, unspecified: Secondary | ICD-10-CM | POA: Insufficient documentation

## 2021-10-13 NOTE — Assessment & Plan Note (Signed)
Currently being followed by Ortho. Pain and swelling improved. Advised to follow up with [place] as needed ?

## 2021-10-13 NOTE — Assessment & Plan Note (Signed)
Interested in [place] prescription. Has used both Viagra and Cialis in the past and has tolerated it well. Does not seem to have contraindications. Discussed risks and not using with other nitrates. He expressed understanding but decided to wait until his insurance changes to get the Rx. Will let me know if he wants me to send in Rx.  ?

## 2021-10-17 NOTE — Progress Notes (Signed)
Received notification from [facility] regarding approval for [place]. Patient assistance approved from 10/13/21 to 07/08/22. ? ?30-DAY SUPPLY OF MEDICATION ESTIMATED TO DELIVER TO PT 10/18/21. ?PT MAY CALL COMPANY FOR REFILLS ? ?Phone: [phone] ? ?

## 2021-10-18 ENCOUNTER — Other Ambulatory Visit: Payer: Self-pay

## 2021-10-18 ENCOUNTER — Telehealth: Payer: Self-pay | Admitting: Podiatry

## 2021-10-18 MED ORDER — TRUEPLUS LANCETS 28G MISC
12 refills | Status: DC
Start: 2021-10-18 — End: 2022-02-26

## 2021-10-18 MED ORDER — TRAMADOL HCL 50 MG PO TABS: 50.0000 mg | ORAL_TABLET | Freq: Three times a day (TID) | ORAL | 0 refills | Status: AC | PRN

## 2021-10-18 MED ORDER — TRUE METRIX BLOOD GLUCOSE TEST VI STRP: ORAL_STRIP | 12 refills | Status: DC

## 2021-10-18 MED ORDER — TRUE METRIX METER DEVI: 0 refills | Status: DC

## 2021-10-18 NOTE — Telephone Encounter (Signed)
Patient called and states he need another refill on his tramadol for pain. He has two fractures and is still in pain. He took the last prescription of qty 15 pills and is currently out. He is still taking the gabapentin. ? ?[id number] [place] [facility] [id number] ?Phone:  [phone] Fax:  [phone] ? ?Please advise ?

## 2021-10-18 NOTE — Addendum Note (Signed)
Addended by: Boneta Lucks on: 10/18/2021 10:21 AM ? ? Modules accepted: Orders ? ?

## 2021-10-19 NOTE — Telephone Encounter (Signed)
Patient called in confirmed appointment for May  -  and confirmed that his medication was called in .

## 2021-10-20 ENCOUNTER — Ambulatory Visit: Payer: Medicare PPO | Admitting: Podiatry

## 2021-10-23 ENCOUNTER — Other Ambulatory Visit: Payer: Self-pay | Admitting: Family Medicine

## 2021-10-23 MED ORDER — TADALAFIL 20 MG PO TABS: 10.0000 mg | ORAL_TABLET | ORAL | 0 refills | Status: DC | PRN

## 2021-11-06 ENCOUNTER — Encounter: Payer: Self-pay | Admitting: Family Medicine

## 2021-11-06 ENCOUNTER — Ambulatory Visit (INDEPENDENT_AMBULATORY_CARE_PROVIDER_SITE_OTHER): Payer: Medicare Other | Admitting: Family Medicine

## 2021-11-06 ENCOUNTER — Other Ambulatory Visit: Payer: Self-pay

## 2021-11-06 VITALS — BP 113/71 | HR 76 | Wt 204.6 lb

## 2021-11-06 DIAGNOSIS — E119 Type 2 diabetes mellitus without complications: Secondary | ICD-10-CM | POA: Diagnosis not present

## 2021-11-06 DIAGNOSIS — N183 Chronic kidney disease, stage 3 unspecified: Secondary | ICD-10-CM | POA: Diagnosis not present

## 2021-11-06 LAB — POCT GLYCOSYLATED HEMOGLOBIN (HGB A1C): HbA1c, POC (controlled diabetic range): 10.6 % — AB (ref 0.0–7.0)

## 2021-11-06 MED ORDER — OZEMPIC (0.25 OR 0.5 MG/DOSE) 2 MG/1.5ML ~~LOC~~ SOPN: 0.2500 mg | PEN_INJECTOR | SUBCUTANEOUS | 0 refills | Status: DC

## 2021-11-06 NOTE — Progress Notes (Signed)
? ? ?  SUBJECTIVE:  ? ?CHIEF COMPLAINT / HPI:  ? ?[name] is a 66 yo who presents for diabetes follow up. Currently taking Januvia'100mg'$ , Farxiga '10mg'$ . Metformin 1000 twice a day.  A1c 10.6 today similar to 3 months ago. He initially stated he cut out starches though he does eat a morning roll, raisin bran, etc. Will also eat some cake. Open to starting back Ozempic, does not want to start insulin.  ? ?Patient brought all meds in with him. Requested Januvia 90 day supply faxed [phone] ? ?States he has seen nephrology in the past, open to seeing them again  ? ? ? ?OBJECTIVE:  ? ?BP 113/71   Pulse 76   Wt 204 lb 9.6 oz (92.8 kg)   SpO2 100%   BMI 26.99 kg/m?   ? ?Physical exam ?General: well appearing, NAD ?Cardiovascular: RRR, no murmurs ?Lungs: CTAB. Normal WOB ?Abdomen: soft, non-distended, non-tender ?Skin: warm, dry. No edema ? ?ASSESSMENT/PLAN:  ? ?Type 2 diabetes mellitus without complications ([place]) ?A1c today 10.6. Was 10.5 2 months ago. Endorses feeling well despite his elevated sugars. Asymptomatic. Currently on Januvia'100mg'$ , Farxiga '10mg'$ . Metformin 1098m twice a day. Recommended insulin but patient would like to avoid. Will start back Ozempic at a low dose and titrate up. If his A1c does not improve at follow up will likely need to start insulin. Also discussed with patient the importance of his nutrition on his diabetes. I think he could benefit from additional guidance with his diet. Recommended nutrition consult which he agreed with.  ? ?CKD (chronic kidney disease) ?Cr 1.82 2 months ago. Progressively worsening likely due to diabetic kidney disease. Discussed the importance of getting his diabetes under better control. Patient  fearful of the idea of dialysis as his brother was on dialysis.  Placed nephrology consult for him to get re connected with to help maximize kidney function.  ? ? ?VShary Key DO ?[place]  ?

## 2021-11-06 NOTE — Patient Instructions (Signed)
It was great seeing you today! ? ?You came in for diabetes follow-up and your A1c is still elevated at 10.6.  We are starting Ozempic 0.25 mg to take on the same day every week.  We will increase the dose next month.  It will be very important to pay attention to your nutrition, so I am referring you to our nutritionist [name].  You will need to call her to set up that appointment.  Her number is [phone] ? ?Please check-out at the front desk before leaving the clinic. I'd like to see you back in 3 months for your next check up, but if you need to be seen earlier than that for any new issues we're happy to fit you in, just give [place] a call! ? ?Feel free to call with any questions or concerns at any time, at [phone]. ?  ?Take care,  ?Dr. [name] ?[place]  ?

## 2021-11-07 NOTE — Assessment & Plan Note (Signed)
Cr 1.82 2 months ago. Progressively worsening likely due to diabetic kidney disease. Discussed the importance of getting his diabetes under better control. Patient  fearful of the idea of dialysis as his brother was on dialysis.  Placed nephrology consult for him to get re connected with to help maximize kidney function.  ?

## 2021-11-07 NOTE — Addendum Note (Signed)
Addended by: [name] on: 11/07/2021 09:34 AM ? ? Modules accepted: Orders ? ?

## 2021-11-07 NOTE — Assessment & Plan Note (Signed)
A1c today 10.6. Was 10.5 2 months ago. Endorses feeling well despite his elevated sugars. Asymptomatic. Currently on Januvia'100mg'$ , Farxiga '10mg'$ . Metformin 1020m twice a day. Recommended insulin but patient would like to avoid. Will start back Ozempic at a low dose and titrate up. If his A1c does not improve at follow up will likely need to start insulin. Also discussed with patient the importance of his nutrition on his diabetes. I think he could benefit from additional guidance with his diet. Recommended nutrition consult which he agreed with.  ?

## 2021-11-09 ENCOUNTER — Ambulatory Visit: Payer: Medicare Other | Admitting: Family Medicine

## 2021-11-09 VITALS — Ht 73.0 in | Wt 203.2 lb

## 2021-11-09 DIAGNOSIS — E119 Type 2 diabetes mellitus without complications: Secondary | ICD-10-CM

## 2021-11-09 DIAGNOSIS — N183 Chronic kidney disease, stage 3 unspecified: Secondary | ICD-10-CM

## 2021-11-09 NOTE — Progress Notes (Signed)
What do you want to get out of meeting with me today? Don't want kidneys to get worse and wants to control sugar. Wants to get himself better.  ? ?Relevant history/background: Hx uncontrolled diabetes (a1c 10.6 on 5/1), HLD, HTN, CKD ? ?Assessment:  ?On a normal day, how many meals do you have? 2 meals.  ?How many snacks do you eat per day? No snacks. ?What beverages do you drink most days of the week? Water, green tea, coffee (stopped today).   ?What foods do you eat most days of the week? Cucumber salad and piece of meat (fried chicken, fried shrimp or fried fish).   ?Usual physical activity: cleaning, yard work. ?Sleep: Patient estimates average of 7-8 hours of sleep/night.  ?24-hr recall:  ?(Up at 4/5 AM) ?B ( 5/6 AM)-  [place] (1 cup) on a tortilla, green tea, water  ?Snk ( AM)-  --- ?L ( 12 PM)-  Cucumber salad (1.5 cups) with french dressing (1 tbs), 1 piece chicken thigh, water  ?Snk ( PM)-  --- ?D ( 5PM)-  Popcorn, water  ?Snk ( 6PM)-  Chicken, rice, broccoli (1 cup)  ?Typical day? Yes.    ? ?Intervention: Completed diet and exercise history, and established: ?- SMART behavioral goals (Specific, Measurable, Action-oriented, Realistic, Time-based.)  ?  1. Eat at least 3 balanced meals a day- breakfast: carb (whole grain) protein. Lunch and Dinner: carb (whole grain), protein (incorporate more grilled/baked protein and less fried), vegetable ?  2. Incorporate exercise into your daily routine- walk after dinner for at least 20 minutes 5x a week  ? ?- How will you document progress on goals?  Patient will record exercise time on his blood sugar record.  ? ?For recommendations and goals, see Patient Instructions.   ? ?Follow-up: 4 weeks.   ? ?[name]  ?

## 2021-11-09 NOTE — Patient Instructions (Addendum)
?. ?  Diet Recommendations for Diabetes  ?Carbohydrate includes starch, sugar, and fiber.  Of these, only sugar and starch raise blood glucose.  (Fiber is found in fruits, vegetables [especially skin, seeds, and stalks], whole grains, and beans.)   ?Starchy (carb) foods: Bread, rice, pasta, potatoes, corn, cereal, grits, crackers, bagels, muffins, all baked goods.  (Fruit, milk, and yogurt also have carbohydrate, but most of these foods will not spike your blood sugar as most starchy or sweet foods will.)  A few fruits do cause high blood sugars; use small portions of bananas (limit to 1/2 at a time), grapes, watermelon, and oranges.   ?Protein foods: Meat, fish, poultry, eggs, dairy foods, and beans such as pinto and kidney beans (beans also provide carbohydrate).  ? ?1. Eat at least 3 balanced meals a day- breakfast: carb (whole grain) protein. Lunch and Dinner: carb (whole grain), protein (incorporate more grilled/baked protein and less fried), vegetable  ?2. For exercise we discussed walking for at least 20 minutes after dinner 5x a week  ?   ? ?   ? ? ?Feel free to call with any questions or concerns at any time, at [phone]. ? ?Reminders:  ?Write down fasting blood sugars on form provided when you do check them ?Write down number of minutes of exercise ?  ?Take care,  ?Dr. [name] ?[place] ?

## 2021-11-16 ENCOUNTER — Telehealth: Payer: Self-pay

## 2021-11-16 NOTE — Telephone Encounter (Signed)
Patient calls nurse line reporting difficulties picking up Ozempic.  ? ?Patient reports with insurance his copy is ~220 dollars.  ? ?Patient is requesting a cheaper change in drug.  ? ?Patient advised he can reach out to his insurance for assistance.  ? ?Also advised him I can reach out to our pharmacy team as well for any assistance.  ?

## 2021-11-17 ENCOUNTER — Telehealth: Payer: Self-pay

## 2021-11-17 NOTE — Telephone Encounter (Signed)
Spoke with pt regarding patient assistance for ozempic. Pt will come Tuesday to sign application & bring proof of income. ?

## 2021-11-21 ENCOUNTER — Other Ambulatory Visit: Payer: Self-pay

## 2021-11-21 DIAGNOSIS — H401131 Primary open-angle glaucoma, bilateral, mild stage: Secondary | ICD-10-CM | POA: Diagnosis not present

## 2021-11-21 MED ORDER — LEVOTHYROXINE SODIUM 75 MCG PO TABS: ORAL_TABLET | ORAL | 3 refills | Status: DC

## 2021-11-22 ENCOUNTER — Other Ambulatory Visit: Payer: Self-pay | Admitting: Family Medicine

## 2021-11-28 NOTE — Telephone Encounter (Signed)
Submitted application for OZEMPIC to [employer] for patient assistance.   Phone: [phone]  

## 2021-12-01 ENCOUNTER — Ambulatory Visit: Payer: Medicare PPO | Admitting: Podiatry

## 2021-12-07 NOTE — Telephone Encounter (Signed)
Received notification from [place] regarding approval for [place]. Patient assistance approved from 12/06/21 to 06/07/22.  MEDICATION WILL SHIP TO OFFICE  Phone: [phone]

## 2021-12-12 ENCOUNTER — Encounter: Payer: Self-pay | Admitting: *Deleted

## 2021-12-15 ENCOUNTER — Ambulatory Visit (INDEPENDENT_AMBULATORY_CARE_PROVIDER_SITE_OTHER): Payer: Medicare Other | Admitting: Family Medicine

## 2021-12-15 ENCOUNTER — Encounter: Payer: Self-pay | Admitting: Family Medicine

## 2021-12-15 DIAGNOSIS — Z713 Dietary counseling and surveillance: Secondary | ICD-10-CM | POA: Insufficient documentation

## 2021-12-15 DIAGNOSIS — R413 Other amnesia: Secondary | ICD-10-CM | POA: Diagnosis not present

## 2021-12-15 DIAGNOSIS — E119 Type 2 diabetes mellitus without complications: Secondary | ICD-10-CM | POA: Diagnosis not present

## 2021-12-15 NOTE — Patient Instructions (Addendum)
It was great seeing you today!  Keep up the good work with your nutrition and your physical activity.  Continue to eat more lean meats and vegetables, and less sugars and juice.  Remember we want your fasting blood sugars to be under 140 and ideally in the low 100s.  For your memory at next visit we will do the test to check on how your memory is.  Please check-out at the front desk before leaving the clinic. I'd like to see you back in 2 months but if you need to be seen earlier than that for any new issues we're happy to fit you in, just give [place] a call!  Feel free to call with any questions or concerns at any time, at [phone].   Take care,  Dr. [name] [facility]

## 2021-12-15 NOTE — Assessment & Plan Note (Signed)
Patient was seen in nutrition clinic on 5/4.  We had discussed eating balanced meals, reducing fried food intake and incorporating vegetables patient states he has been doing.  Has been grilling his meats instead of frying.  Is drinking mainly water, and has been drinking apple juice if he feels his sugars are low but we discussed not needing that.  He has been walking 6 days a week and I encouraged him to continue doing this.  Also continue working on his ensuring balanced meals we discussed instead of eating just a half a bagel to eat it with his egg and bacon.  We will also work on cutting out the juice intake.

## 2021-12-15 NOTE — Assessment & Plan Note (Signed)
Last A1c 10.6 48-monthago.  Patient brought in a list of his blood sugars, and fasting sugars seem to be averaging in the mid 200s.  He feels that when his sugars hit low 200s that he is hypoglycemic, and will then drink apple juice or eat a banana and I continue to provide reassurance that that is not low, and confirm that he feels well during these episodes.  Discussed the goal of keeping fasting blood sugars under 140 and ideally in the low 100s.  Currently on metformin, Jardiance, Farxiga.  Ozempic was started at last visit but patient has not received a call from pharmacy that it has been delivered.  [facility] and hopefully patient can get started on this for additional glucose lowering and weight loss.  We will follow-up in 2 months for next diabetes follow-up

## 2021-12-15 NOTE — Assessment & Plan Note (Signed)
Expressed concern that he is losing memory lately more so in the past month where he is not remembering names as well and had an episode of forgetting where he was going.  Discussed that memory loss is something that happens as we get older, but we can formal evaluation at next visit in check MMSE for baseline.  Recommended daily multivitamin in the meantime.

## 2021-12-15 NOTE — Progress Notes (Signed)
SUBJECTIVE:   CHIEF COMPLAINT / HPI:   Andrew [name] presents for nutrition follow up.   He was seen on 5/4 nutrition clinic and we did a 24-hour recall, and discussed goals of eating 3 balanced meals a day including whole-grain carb, protein, vegetable.  Also discussed incorporating exercise into his routine and had a goal of walking for at least 20 minutes 5 times a week.  Today he states that he has been walking 6 days a week at the [facility] and will walk around 4 times.  As far as his nutrition he has been working on eating balanced meals states that he eats meat and vegetables with each meal, and has cut down on the fried chicken and instead grosses chicken and will have some ribs.  Drinks water, but will have apple juice when he feels that sugars getting lower.  He brought in his sugar log today and fasting blood sugars are averaging in the mid 200s.  He feels that his sugars get too low in the 200s and so he drinks apple juice.  Feels well during these episodes and denies feelings of hypoglycemia.  He also expresses concerns regarding his memory- feels he  cant remember names.  He also had an episode where he was driving and forgot where he was going.  Will fall asleep and dream and then names start popping up.  He states his father had these issues as he got older.  Wondering if he should take a multivitamin versus anything else he can do.  PERTINENT  PMH / PSH: Reviewed   OBJECTIVE:   BP 132/72   Pulse 74   Ht '6\' 1"'$  (1.854 m)   Wt 203 lb (92.1 kg)   SpO2 98%   BMI 26.78 kg/m    Physical exam General: well appearing, NAD Cardiovascular: RRR, no murmurs Lungs: CTAB. Normal WOB Abdomen: soft, non-distended, non-tender Skin: warm, dry. No edema  ASSESSMENT/PLAN:   Memory changes Expressed concern that he is losing memory lately more so in the past month where he is not remembering names as well and had an episode of forgetting where he was going.  Discussed that memory loss is  something that happens as we get older, but we can formal evaluation at next visit in check MMSE for baseline.  Recommended daily multivitamin in the meantime.    Type 2 diabetes mellitus without complications (HCC) Last A1c 10.6 77-monthago.  Patient brought in a list of his blood sugars, and fasting sugars seem to be averaging in the mid 200s.  He feels that when his sugars hit low 200s that he is hypoglycemic, and will then drink apple juice or eat a banana and I continue to provide reassurance that that is not low, and confirm that he feels well during these episodes.  Discussed the goal of keeping fasting blood sugars under 140 and ideally in the low 100s.  Currently on metformin, Jardiance, Farxiga.  Ozempic was started at last visit but patient has not received a call from pharmacy that it has been delivered.  [facility] and hopefully patient can get started on this for additional glucose lowering and weight loss.  We will follow-up in 2 months for next diabetes follow-up  Nutritional counseling Patient was seen in nutrition clinic on 5/4.  We had discussed eating balanced meals, reducing fried food intake and incorporating vegetables patient states he has been doing.  Has been grilling  his meats instead of frying.  Is drinking mainly water, and has been drinking apple juice if he feels his sugars are low but we discussed not needing that.  He has been walking 6 days a week and I encouraged him to continue doing this.  Also continue working on his ensuring balanced meals we discussed instead of eating just a half a bagel to eat it with his egg and bacon.  We will also work on cutting out the juice intake.   [place]

## 2021-12-19 ENCOUNTER — Telehealth: Payer: Self-pay

## 2021-12-19 NOTE — Telephone Encounter (Signed)
Informed pt 2 boxes of ozempic are ready for pickup. Pt will come around 1:30.  Meds are labeled and ready in med room fridge.  (0.'25mg'$  weekly - 4 month supply)

## 2021-12-25 ENCOUNTER — Ambulatory Visit: Payer: Medicare Other | Admitting: Family Medicine

## 2021-12-29 ENCOUNTER — Telehealth: Payer: Self-pay | Admitting: Podiatry

## 2021-12-29 MED ORDER — TRAMADOL HCL 50 MG PO TABS
50.0000 mg | ORAL_TABLET | Freq: Three times a day (TID) | ORAL | 0 refills | Status: AC | PRN
Start: 2021-12-29 — End: 2022-01-03

## 2022-01-05 ENCOUNTER — Other Ambulatory Visit: Payer: Self-pay | Admitting: Family Medicine

## 2022-01-05 ENCOUNTER — Telehealth: Payer: Self-pay

## 2022-01-05 MED ORDER — JANUVIA 100 MG PO TABS
100.0000 mg | ORAL_TABLET | Freq: Every day | ORAL | 3 refills | Status: DC
Start: 2022-01-05 — End: 2022-02-09

## 2022-01-05 NOTE — Telephone Encounter (Signed)
Patient calls nurse line regarding request for Januvia prescription.   Patient reports that he spoke with someone at [facility] that stated that they need a new prescription for 90 days.   Will forward to [place].

## 2022-01-10 ENCOUNTER — Other Ambulatory Visit: Payer: Self-pay

## 2022-01-10 DIAGNOSIS — I10 Essential (primary) hypertension: Secondary | ICD-10-CM

## 2022-01-10 MED ORDER — AMLODIPINE BESYLATE 10 MG PO TABS: 10.0000 mg | ORAL_TABLET | Freq: Every day | ORAL | 2 refills | Status: DC

## 2022-01-17 ENCOUNTER — Ambulatory Visit: Payer: Medicare Other | Admitting: Podiatry

## 2022-01-17 DIAGNOSIS — M19072 Primary osteoarthritis, left ankle and foot: Secondary | ICD-10-CM | POA: Diagnosis not present

## 2022-01-17 DIAGNOSIS — E1142 Type 2 diabetes mellitus with diabetic polyneuropathy: Secondary | ICD-10-CM | POA: Diagnosis not present

## 2022-01-17 NOTE — Progress Notes (Signed)
Subjective:  Patient ID: Andrew [name], male    DOB: [birth year],  MRN: [id number]  Chief Complaint  Patient presents with   Gout    66 y.o. male presents with the above complaint.  Patient presently follows up with left first MPJ joint pain.  He states he does not any pain he is doing a lot better.  The pain injection helped as well as the tramadol. Review of Systems: Negative except as noted in the HPI. Denies N/V/F/Ch.  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   Qualifier: Diagnosis of  By: Dalbert Mayotte     ERECTILE DYSFUNCTION 10/18/2006   Qualifier: Diagnosis of  By: Dalbert Mayotte     HAMMER TOE 07/25/2010   HLD (hyperlipidemia)    HTN (hypertension)    Hypothyroidism    Leg swelling    Lipoma of back 03/18/2011   Personal history of colonic adenomas 04/01/2013   Primary osteoarthritis of right knee 05/21/2018   Prostatitis    hx of x2   PSEUDOFOLLICULITIS BARBAE 10/15/1854   Qualifier: Diagnosis of  By: Danise Mina  MD, Garlon Hatchet     Sarcoidosis    Status post right knee replacement 05/15/2017   T2DM (type 2 diabetes mellitus) ([place])     Current Outpatient Medications:    Acetaminophen (TYLENOL ARTHRITIS EXT RELIEF PO), Take 650 mg by mouth 1 day or 1 dose., Disp: , Rfl:    Alcohol Swabs PADS, Please use prior to checking blood sugar levels up to two times daily., Disp: 120 each, Rfl: 3   allopurinol (ZYLOPRIM) 100 MG tablet, Take 2 tablets (200 mg total) by mouth daily., Disp: 180 tablet, Rfl: 3   amLODipine (NORVASC) 10 MG tablet, Take 1 tablet (10 mg total) by mouth daily., Disp: 90 tablet, Rfl: 2   aspirin EC 81 MG tablet, Take 1 tablet  (81 mg total) by mouth daily., Disp: 90 tablet, Rfl: 2   benzoyl peroxide 5 % external liquid, Apply topically 2 (two) times daily., Disp: 142 g, Rfl: 12   Blood Glucose Calibration (TRUE METRIX LEVEL 1) Low SOLN, Please use to calibrate glucometer., Disp: 1 each, Rfl: 3   Blood Glucose Calibration (TRUE METRIX LEVEL 2) Normal SOLN, Please use to calibrate glucometer., Disp: 1 each, Rfl: 3   Blood Glucose Calibration (TRUE METRIX LEVEL 3) High SOLN, Please use to calibrate glucometer., Disp: 1 each, Rfl: 3   Blood Glucose Monitoring Suppl (TRUE METRIX METER) DEVI, User to check blood sugar 5x per day DX code E11.9, Disp: 1 each, Rfl: 0   cholecalciferol (VITAMIN D) 25 MCG (1000 UNIT) tablet, TAKE 1 TABLET EVERY DAY, Disp: 90 tablet, Rfl: 3   cloNIDine (CATAPRES) 0.1 MG tablet, Take 1 tablet (0.1 mg total) by mouth 2 (two) times daily., Disp: 60 tablet, Rfl: 11   Continuous Blood Gluc Sensor (DEXCOM G6 SENSOR) MISC, Inject 1 applicator into the skin as directed. Change sensor every 10 days., Disp: 3 each, Rfl: 11   Continuous Blood Gluc Transmit (DEXCOM G6 TRANSMITTER) MISC, Inject 1 Device into the skin as directed. Reuse 8 times with sensor changes., Disp: 1 each,  Rfl: 3   dapagliflozin propanediol (FARXIGA) 10 MG TABS tablet, Take 1 tablet (10 mg total) by mouth daily., Disp: 90 tablet, Rfl: 3   enalapril (VASOTEC) 20 MG tablet, TAKE 2 TABLETS EVERY DAY, Disp: 180 tablet, Rfl: 3   fluticasone (FLONASE) 50 MCG/ACT nasal spray, Place 2 sprays into both nostrils daily., Disp: 16 g, Rfl: 6   gabapentin (NEURONTIN) 100 MG capsule, Take 1 capsule (100 mg total) by mouth at bedtime., Disp: 90 capsule, Rfl: 1   Ginger, Zingiber officinalis, (GINGER PO), Take 1 each by mouth daily. Boils fresh ginger and drinks as tea., Disp: , Rfl:    glucose blood (TRUE METRIX BLOOD GLUCOSE TEST) test strip, User to check blood sugar 5x per day DX code E11.9, Disp: 100 each, Rfl: 12   JANUVIA 100 MG tablet, Take 1 tablet  (100 mg total) by mouth daily., Disp: 90 tablet, Rfl: 3   latanoprost (XALATAN) 0.005 % ophthalmic solution, Place 1 drop into both eyes at bedtime., Disp: , Rfl:    levothyroxine (SYNTHROID) 75 MCG tablet, TAKE 1 TABLET EVERY DAY BEFORE BREAKFAST, Disp: 90 tablet, Rfl: 3   meloxicam (MOBIC) 15 MG tablet, TAKE 1 TABLET EVERY DAY, Disp: 30 tablet, Rfl: 0   metFORMIN (GLUCOPHAGE-XR) 500 MG 24 hr tablet, TAKE 2 TABLETS TWICE DAILY, Disp: 360 tablet, Rfl: 2   rosuvastatin (CRESTOR) 20 MG tablet, TAKE 1 TABLET EVERY DAY, Disp: 90 tablet, Rfl: 3   Semaglutide,0.25 or 0.'5MG'$ /DOS, (OZEMPIC, 0.25 OR 0.5 MG/DOSE,) 2 MG/1.5ML SOPN, Inject 0.25 mg into the skin once a week., Disp: 1.5 mL, Rfl: 0   tadalafil (CIALIS) 20 MG tablet, Take 0.5 tablets (10 mg total) by mouth every other day as needed for erectile dysfunction., Disp: 5 tablet, Rfl: 0   tamsulosin (FLOMAX) 0.4 MG CAPS capsule, TAKE 2 CAPSULES EVERY DAY, Disp: 180 capsule, Rfl: 3   traMADol (ULTRAM) 50 MG tablet, Take 1 tablet (50 mg total) by mouth every 8 (eight) hours as needed., Disp: 15 tablet, Rfl: 0   TRUEplus Lancets 28G MISC, User to check blood sugar 5x per day DX code E11.9, Disp: 100 each, Rfl: 12   Turmeric POWD, Take 5 mLs by mouth daily. Mixes 1 teaspoon in water every day and drinks it. Uses for arthritis., Disp: , Rfl:   Social History   Tobacco Use  Smoking Status Never  Smokeless Tobacco Never  Tobacco Comments   Never smoker; no plans to start    Allergies  Allergen Reactions   Metoprolol Succinate Other (See Comments)    Bradycardia (HR to 42)   Penicillins Itching    Has patient had a PCN reaction causing immediate rash, facial/tongue/throat swelling, SOB or lightheadedness with hypotension: No Has patient had a PCN reaction causing severe rash involving mucus membranes or skin necrosis: No Has patient had a PCN reaction that required hospitalization No Has patient had a PCN reaction occurring within the last 10 years:  No If all of the above answers are "NO", then may proceed with Cephalosporin use.   Empagliflozin Other (See Comments)    Dizzy , Dysphoria,  Patient preference to avoid use.    Objective:  There were no vitals filed for this visit. There is no height or weight on file to calculate BMI. Constitutional Well developed. Well nourished.  Vascular Dorsalis pedis pulses palpable bilaterally. Posterior tibial pulses palpable bilaterally. Capillary refill normal to all digits.  No cyanosis or clubbing noted. Pedal hair growth normal.  Neurologic Normal speech.  Oriented to person, place, and time. Epicritic sensation to light touch grossly present bilaterally.  Dermatologic Nails well groomed and normal in appearance. No open wounds. No skin lesions.  Orthopedic: Very mild pain on palpation to the left first metatarsophalangeal joint.  Mild pain with range of motion of the first MPJ.  Mild intra-articular pain noted.  Mild pain on palpation of the right first metatarsophalangeal joint mild pain on palpation to the joint. Very mild pain on palpation to the left second toe at the level of the DIPJ joint.  No pain with range of motion of either joints.   Radiographs: 3 views of skeletally mature adult bilateral foot: Osteoarthritic changes noted to the midfoot bilaterally.  Osteoarthrosis noted to the first metatarsophalangeal joint bilaterally with left greater than right side.  Generalized osteopenia noted.  No other bony abnormalities noted.  Severe pes planus foot structure noted as well. Assessment:   1. Osteoarthritis of first metatarsophalangeal (MTP) joint of left foot   2. Diabetic polyneuropathy associated with type 2 diabetes mellitus ([place])       Plan:  Patient was evaluated and treated and all questions answered.  Left first metatarsophalangeal joint capsulitis with underlying osteoarthritis left greater than right -Clinically doing better today.  At this time I will hold off on  further steroid injection as he is doing better. -Tramadol was dispensed for pain contro as needed -Gabapentin for pain control as needed     No follow-ups on file.

## 2022-01-22 ENCOUNTER — Other Ambulatory Visit: Payer: Self-pay | Admitting: Family Medicine

## 2022-01-23 ENCOUNTER — Telehealth: Payer: Self-pay | Admitting: Podiatry

## 2022-01-23 MED ORDER — TRAMADOL HCL 50 MG PO TABS: 50.0000 mg | ORAL_TABLET | Freq: Three times a day (TID) | ORAL | 0 refills | Status: AC | PRN

## 2022-01-23 NOTE — Telephone Encounter (Signed)
Patient called requesting refill on traMADol (ULTRAM) 50 MG tablet  for his broken toe .   Pharmacy  [facility] on [address]

## 2022-01-30 ENCOUNTER — Telehealth: Payer: Self-pay

## 2022-01-30 NOTE — Telephone Encounter (Signed)
Patient calls nurse line requesting refill on Ozempic.   Patient reports he "picks prescription up at [facility]."   Previous note states that he picked up a 4 month supply on 6/13.  Will forward to [place].

## 2022-01-31 NOTE — Telephone Encounter (Signed)
Left message requesting call back regarding novo nordisk shipment.  Call back [phone]

## 2022-02-07 NOTE — Telephone Encounter (Signed)
Spoke with pt regarding ozempic.  Pt only receives 6 pen needles per box and needs 8 for each pen to complete all his doses of 0.'25mg'$ . Informed pt he could buy a box OTC for around $10 but he refused due to fixed income. Offered to give him a sample box of pen needles if we have any in stock. Pt agreed. (Comes with NovoFine Plus 32G). I will call him back if available.  Pt also says the ozempic makes his sugar really low sometimes. He drinks less than a half cup of apple juice and eats a 1/2 banana as told when he feels this. Sometimes it happens in the middle of the night. He also keeps sugar pills in his car for this. Told pt I would let his pcp know and someone will get back with him.

## 2022-02-09 ENCOUNTER — Telehealth: Payer: Self-pay | Admitting: Pharmacist

## 2022-02-09 MED ORDER — OZEMPIC (0.25 OR 0.5 MG/DOSE) 2 MG/1.5ML ~~LOC~~ SOPN: 0.2500 mg | PEN_INJECTOR | SUBCUTANEOUS | Status: DC

## 2022-02-09 NOTE — Telephone Encounter (Signed)
Patient contacted for follow/up of request for additional needle tips AND also low readings during the night.   Current regimen for Diabetes report: Metformin Farxiga (dapagliflozin) Januvia (sitagliptin) Ozempic 0.'25mg'$  weekly  I agreed to provide him with additional samples of needle tips when returns to the office 8/18 to see PCP, Dr. Arby Barrette.  With low nocturnal readings, following discussion we agreed to make two small changes to minimize the low nocturnal readings.    - Stop Januvia (sitagliptin) - Decrease dose of Ozempic (semaglutide) from 0.25 to a lower dose of 0.'25mg'$  minus 4 clicks Patient verbalized understanding of new treatment plan.  Reassess frequency of low blood glucose readings at PCP visit in 2 weeks.   Total time with patient call and documentation of interaction: 13 minutes.

## 2022-02-09 NOTE — Telephone Encounter (Signed)
Noted and agree. 

## 2022-02-12 ENCOUNTER — Other Ambulatory Visit: Payer: Self-pay | Admitting: Family Medicine

## 2022-02-23 ENCOUNTER — Ambulatory Visit: Payer: Medicare Other | Admitting: Family Medicine

## 2022-02-25 ENCOUNTER — Other Ambulatory Visit: Payer: Self-pay | Admitting: Family Medicine

## 2022-02-26 DIAGNOSIS — Z961 Presence of intraocular lens: Secondary | ICD-10-CM | POA: Diagnosis not present

## 2022-02-26 DIAGNOSIS — H5213 Myopia, bilateral: Secondary | ICD-10-CM | POA: Diagnosis not present

## 2022-02-26 DIAGNOSIS — H401131 Primary open-angle glaucoma, bilateral, mild stage: Secondary | ICD-10-CM | POA: Diagnosis not present

## 2022-02-26 DIAGNOSIS — H52203 Unspecified astigmatism, bilateral: Secondary | ICD-10-CM | POA: Diagnosis not present

## 2022-02-26 DIAGNOSIS — H524 Presbyopia: Secondary | ICD-10-CM | POA: Diagnosis not present

## 2022-02-26 DIAGNOSIS — E119 Type 2 diabetes mellitus without complications: Secondary | ICD-10-CM | POA: Diagnosis not present

## 2022-03-01 ENCOUNTER — Telehealth: Payer: Self-pay

## 2022-03-01 NOTE — Telephone Encounter (Addendum)
Pt called about pen needle samples.   Pt never came to office 8/18 for appt. He was unable to get his pen needle samples needed for ozempic. He came by office today but was told no samples were available for him. (Being that I'm remote today), told pt to let the front desk know to contact Dr Valentina Lucks so he can provide pt with needle samples.  Says he can come back by around 11:30am

## 2022-03-02 MED ORDER — OZEMPIC (0.25 OR 0.5 MG/DOSE) 2 MG/1.5ML ~~LOC~~ SOPN
0.5000 mg | PEN_INJECTOR | SUBCUTANEOUS | Status: DC
Start: 2022-03-02 — End: 2022-05-25

## 2022-03-02 NOTE — Addendum Note (Signed)
Addended by: Leavy Cella on: 03/02/2022 09:54 AM   Modules accepted: Orders

## 2022-03-02 NOTE — Telephone Encounter (Signed)
Phone call to patient in follow-up of needle request.   Patient states he was able to obtain needle tips yesterday.  He has adequate supply for current dosing.   Patient self-reported blood sugar readings were 165-220 fasting.   He reports continued use of Farxiga and Metformin XR at this time.  He has only taken Ozempic (semaglutide) 0.'25mg'$  weekly (takes on Saturday)   We discussed dose escalation from 0.'25mg'$  to 0.'5mg'$   Following our interaction/discussion patient agreed to take higher dose of Ozempic.  He will increase dose tormorrow - Saturday.    I shared with him that I would ask Kelly Splinter, CPhT to increase his dose of Ozempic with the Med assistance plan - incresae to 0.'5mg'$  weekly.

## 2022-03-02 NOTE — Telephone Encounter (Signed)
Noted and agree. 

## 2022-03-06 ENCOUNTER — Telehealth: Payer: Self-pay

## 2022-03-06 NOTE — Telephone Encounter (Signed)
Faxed ozempic dose increase (0.'5mg'$  weekly) to [facility].  Refill signed by Dr. Thompson Grayer

## 2022-03-07 LAB — HM DIABETES EYE EXAM

## 2022-03-08 ENCOUNTER — Telehealth: Payer: Self-pay | Admitting: Podiatry

## 2022-03-08 MED ORDER — TRAMADOL HCL 50 MG PO TABS: 50.0000 mg | ORAL_TABLET | Freq: Three times a day (TID) | ORAL | 0 refills | Status: AC | PRN

## 2022-03-08 NOTE — Telephone Encounter (Signed)
Medication Tramadol   Ph[facility]acy  [facility] [address]   Patient called and requested refill of pain medication .  Please advise

## 2022-03-08 NOTE — Telephone Encounter (Signed)
Notified patient that medication has been sent to pharmacy,

## 2022-03-20 ENCOUNTER — Telehealth: Payer: Self-pay

## 2022-03-20 NOTE — Telephone Encounter (Signed)
Informed pt his shipment from [facility] is ready for pickup  2 boxes of ozempic (0.'5mg'$  weekly) are labeled and ready in fridge

## 2022-03-29 ENCOUNTER — Ambulatory Visit (INDEPENDENT_AMBULATORY_CARE_PROVIDER_SITE_OTHER): Payer: Medicare Other | Admitting: Family Medicine

## 2022-03-29 ENCOUNTER — Encounter: Payer: Self-pay | Admitting: Family Medicine

## 2022-03-29 VITALS — BP 135/74 | HR 60 | Ht 73.0 in | Wt 201.8 lb

## 2022-03-29 DIAGNOSIS — E119 Type 2 diabetes mellitus without complications: Secondary | ICD-10-CM | POA: Diagnosis not present

## 2022-03-29 LAB — POCT GLYCOSYLATED HEMOGLOBIN (HGB A1C): HbA1c, POC (controlled diabetic range): 11.3 % — AB (ref 0.0–7.0)

## 2022-03-29 NOTE — Progress Notes (Signed)
    SUBJECTIVE:   CHIEF COMPLAINT / HPI:   Patient presents for diabetes follow-up. Endorses feeling well without any questions or concerns  Last A1c 10.6 in May. Brought in home blood sugar log in which fasting sugars have ranged in low 200s. States he has been eating sugary snacks if in the high 100s. No hypoglycemic episodes.  Currently on metformin '1000mg'$  daily, Farxiga.  Recently started Ozempic and has been on 0.25 mg weekly   States since he has been taking his vitamins feels his memory has improved. Does not want cognitive testing at this time.   PERTINENT  PMH / PSH: Reviewed   OBJECTIVE:   BP 135/74   Pulse 60   Ht '6\' 1"'$  (1.854 m)   Wt 201 lb 12.8 oz (91.5 kg)   SpO2 100%   BMI 26.62 kg/m    Physical exam General: well appearing, NAD Cardiovascular: RRR, no murmurs Lungs: CTAB. Normal WOB Abdomen: soft, non-distended, non-tender Skin: warm, dry. No edema  ASSESSMENT/PLAN:   No problem-specific Assessment & Plan notes found for this encounter.   DM2 A1C 11.3 up from 10.6 in May.  He has been checking his sugars daily and believes that under 200 is low, so has often been eating sweets when he feels that his sugars are low.  Reviewed logs and his fasting sugars have averaged in the low 200s.  Provided a lot of education today.  Recommended not checking sugars anymore and advised to Increase metformin to 2000 mg a day, and increased Ozempic 2.5 mg weekly. Has an eye appointment scheduled for November. Will follow up with patient in 1-2 months.    [place]

## 2022-03-29 NOTE — Patient Instructions (Signed)
It was great seeing you today!  You came in for diabetes follow up and we are going to increase your metformin to 2 tablets twice a day and increase your Ozempic to .'5mg'$  weekly. Also you can stop checking your sugars  Please check-out at the front desk before leaving the clinic. I'd like to see you back in 2 months for follow up, but if you need to be seen earlier than that for any new issues we're happy to fit you in, just give [place] a call!  Visit Reminders: - Continue to work on your healthy eating habits and incorporating exercise into your daily life.   Feel free to call with any questions or concerns at any time, at [phone].   Take care,  Dr. [name] [facility]

## 2022-04-02 ENCOUNTER — Other Ambulatory Visit: Payer: Self-pay | Admitting: *Deleted

## 2022-04-02 ENCOUNTER — Ambulatory Visit (INDEPENDENT_AMBULATORY_CARE_PROVIDER_SITE_OTHER): Payer: Medicare Other

## 2022-04-02 DIAGNOSIS — Z23 Encounter for immunization: Secondary | ICD-10-CM

## 2022-04-02 NOTE — Progress Notes (Signed)
Patient presents to nurse clinic for flu vaccination. Administered in RD, site unremarkable, tolerated injection well.   Shemicka Cohrs C Denielle Bayard, RN  

## 2022-04-03 MED ORDER — ROSUVASTATIN CALCIUM 20 MG PO TABS: 20.0000 mg | ORAL_TABLET | Freq: Every day | ORAL | 3 refills | Status: DC

## 2022-04-05 ENCOUNTER — Other Ambulatory Visit: Payer: Self-pay | Admitting: Family Medicine

## 2022-04-12 ENCOUNTER — Telehealth: Payer: Self-pay | Admitting: Podiatry

## 2022-04-12 MED ORDER — TRAMADOL HCL 50 MG PO TABS: 50.0000 mg | ORAL_TABLET | Freq: Three times a day (TID) | ORAL | 0 refills | Status: AC | PRN

## 2022-04-12 NOTE — Telephone Encounter (Signed)
Patient called and stated that his gout has flared up and would like Dr. Posey Pronto to send in a prescription for Tramadol

## 2022-04-16 ENCOUNTER — Other Ambulatory Visit: Payer: Self-pay | Admitting: Family Medicine

## 2022-04-19 ENCOUNTER — Telehealth: Payer: Self-pay

## 2022-04-19 NOTE — Telephone Encounter (Signed)
Received notification from [employer] regarding RE-ENROLLMENT approval for FARXIGA. Patient assistance approved from 07/09/22 to 07/09/23.  Phone: [phone]  

## 2022-04-21 ENCOUNTER — Other Ambulatory Visit: Payer: Self-pay | Admitting: Family Medicine

## 2022-05-02 ENCOUNTER — Other Ambulatory Visit: Payer: Self-pay | Admitting: Family Medicine

## 2022-05-03 ENCOUNTER — Other Ambulatory Visit: Payer: Self-pay

## 2022-05-04 MED ORDER — ENALAPRIL MALEATE 20 MG PO TABS: 40.0000 mg | ORAL_TABLET | Freq: Every day | ORAL | 3 refills | Status: DC

## 2022-05-08 ENCOUNTER — Other Ambulatory Visit: Payer: Self-pay | Admitting: Family Medicine

## 2022-05-08 DIAGNOSIS — I10 Essential (primary) hypertension: Secondary | ICD-10-CM

## 2022-05-14 ENCOUNTER — Telehealth: Payer: Self-pay | Admitting: Podiatry

## 2022-05-14 MED ORDER — TRAMADOL HCL 50 MG PO TABS: 50.0000 mg | ORAL_TABLET | Freq: Three times a day (TID) | ORAL | 0 refills | Status: AC | PRN

## 2022-05-14 NOTE — Telephone Encounter (Signed)
Pt called in to request a Rx refill on Tramadol. [facility] uses the [facility] on [address]. Please advise.

## 2022-05-17 NOTE — Telephone Encounter (Signed)
Disregard previous note - patient eligible for 2024 re-enrollment.

## 2022-05-22 NOTE — Telephone Encounter (Signed)
Called patient and gave physician's recommendations, will schedule f/u appointment.

## 2022-05-25 ENCOUNTER — Encounter: Payer: Self-pay | Admitting: Family Medicine

## 2022-05-25 ENCOUNTER — Ambulatory Visit (INDEPENDENT_AMBULATORY_CARE_PROVIDER_SITE_OTHER): Payer: Medicare Other | Admitting: Family Medicine

## 2022-05-25 ENCOUNTER — Other Ambulatory Visit: Payer: Self-pay

## 2022-05-25 VITALS — BP 135/76 | HR 69 | Wt 201.0 lb

## 2022-05-25 DIAGNOSIS — E119 Type 2 diabetes mellitus without complications: Secondary | ICD-10-CM | POA: Diagnosis not present

## 2022-05-25 LAB — POCT GLYCOSYLATED HEMOGLOBIN (HGB A1C): HbA1c, POC (controlled diabetic range): 9.9 % — AB (ref 0.0–7.0)

## 2022-05-25 MED ORDER — OZEMPIC (0.25 OR 0.5 MG/DOSE) 2 MG/1.5ML ~~LOC~~ SOPN: 0.5000 mg | PEN_INJECTOR | SUBCUTANEOUS | 0 refills | Status: DC

## 2022-05-25 NOTE — Patient Instructions (Signed)
It was great seeing you today!  We followed up on your diabetes and good job getting it down from 11.3 to 9.9. Continue taking your medication, and I have given you an Ozempic sample to hold you off until the following month. Continue .'5mg'$  weekly and next month we will increase to '1mg'$ .   Also remember not to take your glucose tabs unless your sugars fall below 70. You dont need to check you sugars daily   Lets follow up in 3 months for your next check up, but if you need to be seen earlier than that for any new issues we're happy to fit you in, just give [place] a call!  Feel free to call with any questions or concerns at any time, at [phone].   Take care,  Dr. [name] [facility]

## 2022-05-25 NOTE — Progress Notes (Unsigned)
    SUBJECTIVE:   CHIEF COMPLAINT / HPI:   Andrew [name] presents to follow up on diabetes  A1c 2 months ago was 11.3. His Metformin was increased to '2000mg'$  a day and his Ozempic was increased to .'5mg'$  weekly. Was told to stop checking sugars because he often felt like it was low and would then eat foods with sugar. Today endorses feeling well but does still check his sugars daily and keeps a glucose tab in his pocket. He states he is running low on his Ozempic and states Rosendo Gros working on getting his medication he is frustruated that he hasn't heard back about it from her. Denies any other questions or concerns today.   PERTINENT  PMH / PSH: Reviewed   OBJECTIVE:   BP (!) 140/74   Pulse 69   Wt 201 lb (91.2 kg)   SpO2 99%   BMI 26.52 kg/m    Physical exam General: well appearing, NAD Cardiovascular: RRR, no murmurs Lungs: CTAB. Normal WOB Abdomen: soft, non-distended, non-tender Skin: warm, dry. No edema  ASSESSMENT/PLAN:   No problem-specific Assessment & Plan notes found for this encounter.   Type 2 Diabetes  A1c 9.9 down from 11.3 two months ago. Has been on Metformin '2000mg'$  daily and Ozempic.'5mg'$  daily. Discussed not checking his sugars unless he fees ill. Discussed letting me know if his sugars go below 70. I gave him a sample of Ozempic .'5mg'$  to hold him until he gets his Rx from [place]. He also gave me a form to fill out for Ozempic from the manufacturer.  - Continue Metformin '1000mg'$  BID - Continue Ozempic .'5mg'$  weekly. Will increase to '1mg'$  for next refill  - follow up in 3 months for next A1c check    [name], [place]

## 2022-05-27 NOTE — Assessment & Plan Note (Signed)
A1c 9.9 down from 11.3 two months ago. Has been on Metformin '2000mg'$  daily and Ozempic.'5mg'$  daily. Discussed not checking his sugars unless he fees ill. Discussed letting me know if his sugars go below 70. I gave him a sample of Ozempic .'5mg'$  to hold him until he gets his Rx from [place]. He also gave me a form to fill out for Ozempic from the manufacturer.  - Continue Metformin '1000mg'$  BID - Continue Ozempic .'5mg'$  weekly. Will increase to '1mg'$  for next refill  - follow up in 3 months for next A1c check

## 2022-05-29 DIAGNOSIS — H401131 Primary open-angle glaucoma, bilateral, mild stage: Secondary | ICD-10-CM | POA: Diagnosis not present

## 2022-06-08 ENCOUNTER — Telehealth: Payer: Self-pay

## 2022-06-08 NOTE — Telephone Encounter (Signed)
Pt left message regarding being almost out of ozempic medication.   Called pt back and left message informing him that his novo nordisk shipment just processed on 11/30 and should be at the office in a few weeks, however it is for the 0.'5mg'$  dose. Dose increase of '1mg'$  will be faxed to company once renewal is approved. Informed pt I will be in office on Tuesday 12/5 and can check for samples. I will give him a call back then.

## 2022-06-12 NOTE — Telephone Encounter (Signed)
Medication Samples have been provided to the patient.  Drug name: OZEMPIC       Strength: '4MG'$ Fayne Mediate        Qty: 1 PEN  LOT: [id number]  Exp.Date: 06/07/24  Dosing instructions: INJECT '1MG'$  ONCE WEEKLY   Talbert Cage [place] 11:21 AM 06/12/2022

## 2022-06-19 NOTE — Telephone Encounter (Signed)
Replaced samples to stock.  Called to inform patient his 0.'5mg'$  dose ozempic pens are ready for pickup (5 boxes). Pt aware to to do 0.'5mg'$  until next shipment/re-enrollment comes in, which is written for the '1mg'$  dose.

## 2022-06-19 NOTE — Telephone Encounter (Signed)
**  correction**  Unsure if patient picked up samples that were available before actual novo nordisk order arrived. samples no longer in fridge.

## 2022-06-19 NOTE — Telephone Encounter (Signed)
Patient presents to clinic to pick up medication. Provided per note from [place].   Talbot Grumbling, RN

## 2022-06-21 ENCOUNTER — Other Ambulatory Visit: Payer: Self-pay | Admitting: Podiatry

## 2022-06-21 ENCOUNTER — Other Ambulatory Visit: Payer: Self-pay | Admitting: Family Medicine

## 2022-06-29 ENCOUNTER — Ambulatory Visit: Payer: Medicare Other | Admitting: Podiatry

## 2022-06-29 NOTE — Telephone Encounter (Signed)
Received notification from [place] regarding approval for Andrew [name]. Patient assistance approved from 06/25/22 to 07/09/23.  MEDICATION WILL CONTINUE TO SHIP TO OFFICE  Phone: [phone]

## 2022-07-03 ENCOUNTER — Telehealth: Payer: Self-pay | Admitting: Podiatry

## 2022-07-03 MED ORDER — TRAMADOL HCL 50 MG PO TABS: 50.0000 mg | ORAL_TABLET | Freq: Three times a day (TID) | ORAL | 0 refills | Status: AC | PRN

## 2022-07-03 NOTE — Telephone Encounter (Signed)
Pt called stating he's in pain wanting a Rx for Tramadol. Pt has an appointment 12/27 to see Posey Pronto  Please advise

## 2022-07-04 ENCOUNTER — Ambulatory Visit: Payer: Medicare Other | Admitting: Podiatry

## 2022-07-04 DIAGNOSIS — M19072 Primary osteoarthritis, left ankle and foot: Secondary | ICD-10-CM | POA: Diagnosis not present

## 2022-07-04 DIAGNOSIS — M7752 Other enthesopathy of left foot: Secondary | ICD-10-CM | POA: Diagnosis not present

## 2022-07-04 NOTE — Progress Notes (Signed)
Subjective:  Patient ID: Andrew [name], male    DOB: 03-31-1956,  MRN: [id number]  Chief Complaint  Patient presents with   Gout    66 y.o. male presents with the above complaint.  Patient presents with a follow-up of left first metatarsophalangeal joint pain.  Patient states doing well but his big toe started to hurt again.  Is been a while since he received a steroid injection he would like another 1 he denies any other acute complaints  Review of Systems: Negative except as noted in the HPI. Denies N/V/F/Ch.  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   Qualifier: Diagnosis of  By: Dalbert Mayotte     ERECTILE DYSFUNCTION 10/18/2006   Qualifier: Diagnosis of  By: Dalbert Mayotte     HAMMER TOE 07/25/2010   HLD (hyperlipidemia)    HTN (hypertension)    Hypothyroidism    Leg swelling    Lipoma of back 03/18/2011   Personal history of colonic adenomas 04/01/2013   Primary osteoarthritis of right knee 05/21/2018   Prostatitis    hx of x2   PSEUDOFOLLICULITIS BARBAE 0/07/6008   Qualifier: Diagnosis of  By: Danise Mina  MD, Garlon Hatchet     Sarcoidosis    Status post right knee replacement 05/15/2017   T2DM (type 2 diabetes mellitus) ([place])     Current Outpatient Medications:    ACCU-CHEK GUIDE test strip, USE TO CHECK BLOOD SUGAR 5 TIMES DAILY, Disp: 500 strip, Rfl: 2   Accu-Chek Softclix Lancets lancets, USE TO TEST BLOOD SUGAR FIVE  TIMES PER DAY, Disp: 500 each, Rfl: 2   Acetaminophen (TYLENOL ARTHRITIS EXT RELIEF PO), Take 650 mg by mouth 1 day or 1 dose., Disp: , Rfl:    Alcohol Swabs PADS, Please use prior to checking blood sugar levels up to two times  daily., Disp: 120 each, Rfl: 3   allopurinol (ZYLOPRIM) 100 MG tablet, TAKE 2 TABLETS BY MOUTH EVERY DAY, Disp: 180 tablet, Rfl: 3   amLODipine (NORVASC) 10 MG tablet, TAKE 1 TABLET BY MOUTH DAILY, Disp: 100 tablet, Rfl: 2   aspirin EC 81 MG tablet, Take 1 tablet (81 mg total) by mouth daily., Disp: 90 tablet, Rfl: 2   benzoyl peroxide 5 % external liquid, Apply topically 2 (two) times daily., Disp: 142 g, Rfl: 12   Blood Glucose Calibration (TRUE METRIX LEVEL 1) Low SOLN, Please use to calibrate glucometer., Disp: 1 each, Rfl: 3   Blood Glucose Calibration (TRUE METRIX LEVEL 2) Normal SOLN, Please use to calibrate glucometer., Disp: 1 each, Rfl: 3   Blood Glucose Calibration (TRUE METRIX LEVEL 3) High SOLN, Please use to calibrate glucometer., Disp: 1 each, Rfl: 3   Blood Glucose Monitoring Suppl (TRUE METRIX METER) DEVI, User to check blood sugar 5x per day DX code E11.9, Disp: 1 each, Rfl: 0   cholecalciferol (VITAMIN D) 25 MCG (1000 UNIT) tablet, TAKE 1 TABLET EVERY DAY, Disp: 90 tablet, Rfl: 3   cloNIDine (CATAPRES) 0.1 MG tablet, TAKE 1 TABLET BY MOUTH 2 TIMES DAILY., Disp: 180 tablet, Rfl: 3   Continuous Blood Gluc Sensor (DEXCOM G6 SENSOR) MISC, Inject  1 applicator into the skin as directed. Change sensor every 10 days., Disp: 3 each, Rfl: 11   Continuous Blood Gluc Transmit (DEXCOM G6 TRANSMITTER) MISC, Inject 1 Device into the skin as directed. Reuse 8 times with sensor changes., Disp: 1 each, Rfl: 3   dapagliflozin propanediol (FARXIGA) 10 MG TABS tablet, Take 1 tablet (10 mg total) by mouth daily., Disp: 90 tablet, Rfl: 3   enalapril (VASOTEC) 20 MG tablet, Take 2 tablets (40 mg total) by mouth daily., Disp: 180 tablet, Rfl: 3   fluticasone (FLONASE) 50 MCG/ACT nasal spray, SPRAY 2 SPRAYS INTO EACH NOSTRIL EVERY DAY, Disp: 48 mL, Rfl: 2   gabapentin (NEURONTIN) 100 MG capsule, Take 1 capsule (100 mg total) by mouth at bedtime., Disp: 90 capsule, Rfl: 1   Ginger, Zingiber officinalis,  (GINGER PO), Take 1 each by mouth daily. Boils fresh ginger and drinks as tea., Disp: , Rfl:    latanoprost (XALATAN) 0.005 % ophthalmic solution, Place 1 drop into both eyes at bedtime., Disp: , Rfl:    levothyroxine (SYNTHROID) 75 MCG tablet, TAKE 1 TABLET EVERY DAY BEFORE BREAKFAST, Disp: 90 tablet, Rfl: 3   meloxicam (MOBIC) 15 MG tablet, TAKE 1 TABLET BY MOUTH DAILY, Disp: 90 tablet, Rfl: 3   metFORMIN (GLUCOPHAGE-XR) 500 MG 24 hr tablet, TAKE 2 TABLETS BY MOUTH TWICE  DAILY, Disp: 400 tablet, Rfl: 2   rosuvastatin (CRESTOR) 20 MG tablet, Take 1 tablet (20 mg total) by mouth daily., Disp: 90 tablet, Rfl: 3   Semaglutide,0.25 or 0.'5MG'$ /DOS, (OZEMPIC, 0.25 OR 0.5 MG/DOSE,) 2 MG/1.5ML SOPN, Inject 0.5 mg into the skin once a week. 0.'25mg'$  minus 4 clicks weekly, Disp: 2 mL, Rfl: 0   tadalafil (CIALIS) 20 MG tablet, Take 0.5 tablets (10 mg total) by mouth every other day as needed for erectile dysfunction., Disp: 5 tablet, Rfl: 0   tamsulosin (FLOMAX) 0.4 MG CAPS capsule, TAKE 2 CAPSULES EVERY DAY, Disp: 180 capsule, Rfl: 3   traMADol (ULTRAM) 50 MG tablet, Take 1 tablet (50 mg total) by mouth every 8 (eight) hours as needed., Disp: 15 tablet, Rfl: 0   traMADol (ULTRAM) 50 MG tablet, Take 1 tablet (50 mg total) by mouth every 8 (eight) hours as needed for up to 5 days., Disp: 15 tablet, Rfl: 0   Turmeric POWD, Take 5 mLs by mouth daily. Mixes 1 teaspoon in water every day and drinks it. Uses for arthritis., Disp: , Rfl:   Social History   Tobacco Use  Smoking Status Never  Smokeless Tobacco Never  Tobacco Comments   Never smoker; no plans to start    Allergies  Allergen Reactions   Metoprolol Succinate Other (See Comments)    Bradycardia (HR to 42)   Penicillins Itching    Has patient had a PCN reaction causing immediate rash, facial/tongue/throat swelling, SOB or lightheadedness with hypotension: No Has patient had a PCN reaction causing severe rash involving mucus membranes or skin  necrosis: No Has patient had a PCN reaction that required hospitalization No Has patient had a PCN reaction occurring within the last 10 years: No If all of the above answers are "NO", then may proceed with Cephalosporin use.   Empagliflozin Other (See Comments)    Dizzy , Dysphoria,  Patient preference to avoid use.    Objective:  There were no vitals filed for this visit. There is no height or weight on file to calculate BMI. Constitutional Well developed. Well nourished.  Vascular Dorsalis pedis pulses palpable bilaterally. Posterior  tibial pulses palpable bilaterally. Capillary refill normal to all digits.  No cyanosis or clubbing noted. Pedal hair growth normal.  Neurologic Normal speech. Oriented to person, place, and time. Epicritic sensation to light touch grossly present bilaterally.  Dermatologic Nails well groomed and normal in appearance. No open wounds. No skin lesions.  Orthopedic: pain on palpation to the left first metatarsophalangeal joint.  Mild pain with range of motion of the first MPJ.  Mild intra-articular pain noted.  Mild pain on palpation of the right first metatarsophalangeal joint mild pain on palpation to the joint. Very mild pain on palpation to the left second toe at the level of the DIPJ joint.  No pain with range of motion of either joints.   Radiographs: 3 views of skeletally mature adult bilateral foot: Osteoarthritic changes noted to the midfoot bilaterally.  Osteoarthrosis noted to the first metatarsophalangeal joint bilaterally with left greater than right side.  Generalized osteopenia noted.  No other bony abnormalities noted.  Severe pes planus foot structure noted as well. Assessment:   No diagnosis found.    Plan:  Patient was evaluated and treated and all questions answered.  Left first metatarsophalangeal joint capsulitis with underlying osteoarthritis left greater than right -A steroid injection was performed at left first MTP using 1%  plain Lidocaine and 10 mg of Kenalog. This was well tolerated. -Discussed prevention options. -Tramadol was dispensed for pain control. -     No follow-ups on file.

## 2022-07-05 NOTE — Telephone Encounter (Signed)
Patient was seen yesterday with Dr. Posey Pronto.

## 2022-07-06 ENCOUNTER — Other Ambulatory Visit: Payer: Self-pay | Admitting: Family Medicine

## 2022-07-10 ENCOUNTER — Other Ambulatory Visit: Payer: Self-pay | Admitting: Family Medicine

## 2022-07-17 ENCOUNTER — Other Ambulatory Visit: Payer: Self-pay | Admitting: *Deleted

## 2022-07-17 MED ORDER — TAMSULOSIN HCL 0.4 MG PO CAPS: 0.8000 mg | ORAL_CAPSULE | Freq: Every day | ORAL | 3 refills | Status: DC

## 2022-07-24 ENCOUNTER — Other Ambulatory Visit: Payer: Self-pay

## 2022-07-24 NOTE — Telephone Encounter (Signed)
Patient calls nurse line requesting a refill on Farxiga.   He reports he is on his last bottle. He reports he gets help from medication assistance.   I see where patient was approved for 07/09/2022-07/09/2023.  Will forward to pharmacy to see a refill is obtained.

## 2022-07-26 MED ORDER — DAPAGLIFLOZIN PROPANEDIOL 10 MG PO TABS: 10.0000 mg | ORAL_TABLET | Freq: Every day | ORAL | 3 refills | Status: DC

## 2022-07-30 ENCOUNTER — Telehealth: Payer: Self-pay | Admitting: *Deleted

## 2022-07-30 NOTE — Telephone Encounter (Signed)
error 

## 2022-08-06 NOTE — Progress Notes (Deleted)
  SUBJECTIVE:   CHIEF COMPLAINT / HPI:   Constipation Fam Hx of IBD or CRC?  Blood in stools?  Diet/nutrition/mobillity?  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/2006   Qualifier: Diagnosis of  By: Dalbert Mayotte     ERECTILE DYSFUNCTION 10/18/2006   Qualifier: Diagnosis of  By: Dalbert Mayotte     HAMMER TOE 07/25/2010   HLD (hyperlipidemia)    HTN (hypertension)    Hypothyroidism    Leg swelling    Lipoma of back 03/18/2011   Personal history of colonic adenomas 04/01/2013   Primary osteoarthritis of right knee 05/21/2018   Prostatitis    hx of x2   PSEUDOFOLLICULITIS BARBAE [id number]   Qualifier: Diagnosis of  By: Danise Mina  MD, Garlon Hatchet     Sarcoidosis    Status post right knee replacement 05/15/2017   T2DM (type 2 diabetes mellitus) ([place])     OBJECTIVE:  There were no vitals taken for this visit. Physical Exam   ASSESSMENT/PLAN:  There are no diagnoses linked to this encounter. No follow-ups on file. Holley Bouche, MD 08/06/2022, 3:11 PM PGY-***, [facility] {    This will disappear when note is signed, click to select method of visit    :1}

## 2022-08-06 NOTE — Patient Instructions (Incomplete)
It was great to see you! Thank you for allowing me to participate in your care!  I recommend that you always bring your medications to each appointment as this makes it easy to ensure we are on the correct medications and helps us not miss when refills are needed.  Our plans for today:  - *** -   We are checking some labs today, I will call you if they are abnormal will send you a MyChart message or a letter if they are normal.  If you do not hear about your labs in the next 2 weeks please let us know.***  Take care and seek immediate care sooner if you develop any concerns.   Dr. Jinan Biggins, MD [facility]  

## 2022-08-07 ENCOUNTER — Ambulatory Visit: Payer: Medicare Other | Admitting: Student

## 2022-08-15 ENCOUNTER — Other Ambulatory Visit: Payer: Self-pay

## 2022-08-15 MED ORDER — ACCU-CHEK GUIDE VI STRP: ORAL_STRIP | 2 refills | Status: DC

## 2022-08-15 MED ORDER — ACCU-CHEK GUIDE W/DEVICE KIT: PACK | 0 refills | Status: DC

## 2022-08-15 NOTE — Telephone Encounter (Signed)
Patient calls nurse line requesting a new order for DM supplies.   He reports he needs accu chek gudie sent to his local pharmacy. He reports [facility] does not carry DM supplies.   Will send to PCP.

## 2022-08-22 ENCOUNTER — Other Ambulatory Visit: Payer: Self-pay

## 2022-08-24 ENCOUNTER — Encounter: Payer: Self-pay | Admitting: Family Medicine

## 2022-08-24 ENCOUNTER — Ambulatory Visit (INDEPENDENT_AMBULATORY_CARE_PROVIDER_SITE_OTHER): Payer: Medicare Other | Admitting: Family Medicine

## 2022-08-24 VITALS — BP 124/75 | HR 77 | Wt 200.0 lb

## 2022-08-24 DIAGNOSIS — E119 Type 2 diabetes mellitus without complications: Secondary | ICD-10-CM | POA: Diagnosis not present

## 2022-08-24 LAB — POCT GLYCOSYLATED HEMOGLOBIN (HGB A1C): HbA1c, POC (controlled diabetic range): 10 % — AB (ref 0.0–7.0)

## 2022-08-24 MED ORDER — DAPAGLIFLOZIN PROPANEDIOL 10 MG PO TABS: 10.0000 mg | ORAL_TABLET | Freq: Every day | ORAL | 3 refills | Status: DC

## 2022-08-24 MED ORDER — SEMAGLUTIDE (1 MG/DOSE) 4 MG/3ML ~~LOC~~ SOPN: 1.0000 mg | PEN_INJECTOR | SUBCUTANEOUS | 0 refills | Status: DC

## 2022-08-24 NOTE — Progress Notes (Signed)
    SUBJECTIVE:   CHIEF COMPLAINT / HPI:   Patient presents for diabetes follow up  Has been on ozempic .10m weekly and Metforming 10010mBID Does not believe he is taking [place] diet has been good- has been reducing carbs and sugar  Has been walking around [employer] for exercise  Open to hearing about the diabetes liberate study from pharmcy   Does endorse polyuria and polydipsia over the past month. Denies abdominal pain or nausea.   PERTINENT  PMH / PSH: Reviewed   OBJECTIVE:   BP 124/75   Pulse 77   Wt 200 lb (90.7 kg)   SpO2 97%   BMI 26.39 kg/m    Physical exam General: well appearing, NAD Cardiovascular: RRR, no murmurs Lungs: CTAB. Normal WOB Abdomen: soft, non-distended, non-tender Skin: warm, dry. No edema  ASSESSMENT/PLAN:   Type 2 diabetes mellitus without complications (HCC) [id number] from 9.9 three months ago. Currently on Ozempic .36m83meekly and Metforming 1000m34mD.  Increased Ozempic to 1 mg weekly continue metformin, and we started Farxiga 10 mg daily.  Patient has had side effects in the past but willing to restart.  He was also connected with pharmacy team after the visit to discuss delivery study which.  Discussed with pharmacy team to also follow-up with him with regards to diabetes management which they were agreeable with. Will check BMP to assess kidney function and lipid panel.    Misc Note sent to clinic to schedule Medicare annual exam   [place]

## 2022-08-24 NOTE — Patient Instructions (Signed)
It was great seeing you today!  A1c is up a bit 10 from 9.9 previously.  We are increasing her Ozempic to 1 mg weekly, continue metformin 1000 mg twice a day, and restarting you on Farxiga 10 mg daily.  Pharmacy team will also follow up with you about the diabetes study that was discussed with you today, and will adjust any medications as well.  We are getting blood work today also to check your kidney function and cholesterol.   I will see you back in 3 months for your next follow up, but if you need to be seen earlier than that for any new issues we're happy to fit you in, just give [place] a call!  Feel free to call with any questions or concerns at any time, at [phone].   Take care,  Dr. [name] [facility]

## 2022-08-24 NOTE — Assessment & Plan Note (Signed)
A1c 10 from 9.9 three months ago. Currently on Ozempic .67m weekly and Metforming 10035mBID.  Increased Ozempic to 1 mg weekly continue metformin, and we started Farxiga 10 mg daily.  Patient has had side effects in the past but willing to restart.  He was also connected with pharmacy team after the visit to discuss delivery study which.  Discussed with pharmacy team to also follow-up with him with regards to diabetes management which they were agreeable with. Will check BMP to assess kidney function and lipid panel.

## 2022-08-25 LAB — LIPID PANEL
Chol/HDL Ratio: 2.1 ratio (ref 0.0–5.0)
Cholesterol, Total: 99 mg/dL — ABNORMAL LOW (ref 100–199)
HDL: 48 mg/dL (ref >39)
LDL Chol Calc (NIH): 29 mg/dL (ref 0–99)
Triglycerides: 122 mg/dL (ref 0–149)
VLDL Cholesterol Cal: 22 mg/dL (ref 5–40)

## 2022-08-25 LAB — BASIC METABOLIC PANEL
BUN/Creatinine Ratio: 9 — ABNORMAL LOW (ref 10–24)
BUN: 13 mg/dL (ref 8–27)
CO2: 24 mmol/L (ref 20–29)
Calcium: 9.3 mg/dL (ref 8.6–10.2)
Chloride: 97 mmol/L (ref 96–106)
Creatinine, Ser: 1.45 mg/dL — ABNORMAL HIGH (ref 0.76–1.27)
Glucose: 261 mg/dL — ABNORMAL HIGH (ref 70–99)
Potassium: 4.6 mmol/L (ref 3.5–5.2)
Sodium: 136 mmol/L (ref 134–144)
eGFR: 53 mL/min/{1.73_m2} — ABNORMAL LOW (ref >59)

## 2022-08-27 ENCOUNTER — Other Ambulatory Visit: Payer: Self-pay | Admitting: Family Medicine

## 2022-08-27 ENCOUNTER — Other Ambulatory Visit: Payer: Self-pay

## 2022-08-27 MED ORDER — TRAMADOL HCL 50 MG PO TABS: 50.0000 mg | ORAL_TABLET | Freq: Three times a day (TID) | ORAL | 0 refills | Status: DC | PRN

## 2022-08-27 MED ORDER — DAPAGLIFLOZIN PROPANEDIOL 10 MG PO TABS: 10.0000 mg | ORAL_TABLET | Freq: Every day | ORAL | 3 refills | Status: DC

## 2022-08-27 MED ORDER — LEVOTHYROXINE SODIUM 75 MCG PO TABS: 75.0000 ug | ORAL_TABLET | Freq: Every day | ORAL | 2 refills | Status: DC

## 2022-08-27 NOTE — Telephone Encounter (Signed)
Patient calls nurse line regarding levothyroxine prescription. He reports that this needs to be sent to [facility].    Called and canceled at [facility]. Last fill 05/03/2022.  Resent refills to [facility].   Talbot Grumbling, RN

## 2022-08-27 NOTE — Progress Notes (Signed)
[facility] Rx resent to [facility] pharmacy for cost reasons per request from [place].

## 2022-08-30 ENCOUNTER — Ambulatory Visit (INDEPENDENT_AMBULATORY_CARE_PROVIDER_SITE_OTHER): Payer: Medicare Other | Admitting: Pharmacist

## 2022-08-30 ENCOUNTER — Encounter: Payer: Self-pay | Admitting: Pharmacist

## 2022-08-30 VITALS — BP 127/63 | HR 68 | Wt 201.6 lb

## 2022-08-30 DIAGNOSIS — E785 Hyperlipidemia, unspecified: Secondary | ICD-10-CM

## 2022-08-30 DIAGNOSIS — E1169 Type 2 diabetes mellitus with other specified complication: Secondary | ICD-10-CM

## 2022-08-30 DIAGNOSIS — I10 Essential (primary) hypertension: Secondary | ICD-10-CM

## 2022-08-30 DIAGNOSIS — E119 Type 2 diabetes mellitus without complications: Secondary | ICD-10-CM

## 2022-08-30 LAB — POCT GLYCOSYLATED HEMOGLOBIN (HGB A1C): HbA1c, POC (controlled diabetic range): 10.2 % — AB (ref 0.0–7.0)

## 2022-08-30 NOTE — Assessment & Plan Note (Signed)
ASCVD risk - secondary prevention in patient with diabetes. Last LDL is close to goal at 72; but not quite at goal of <70 mg/dL. ASCVD risk factors include h/o stroke, T2DM, HTN, and HLD. High intensity statin indicated.  -Continued rosuvastatin 20 mg.  -Continued aspirin 81 mg daily.

## 2022-08-30 NOTE — Assessment & Plan Note (Signed)
LIBERATE Study:  -Patient provided verbal consent to participate in the study. Consent documented in electronic medical record.  -Provided education on Libre 3 CGM. Collaborated to ensure Elenor Legato 3 app was downloaded on patient's phone. Educated on how to place sensor every 14 days, patient placed first sensor correctly and verbalized understanding of use, removal, and how to place next sensor. Discussed alarms. 8 sensors provided for a 3 month supply. Educated to contact the office if the sensor falls off early and replacements are needed before their next [employer].   Diabetes longstanding currently uncontrolled with an A1c of 10.2 Patient is able to verbalize appropriate hypoglycemia management plan. Medication adherence appears adequate. Control is suboptimal due to polypharmacy and confusion with dose changes. -Continue GLP-1 Ozempic (semaglutide) at 0.5 mg weekly. Patient has not yet received 1 mg pen from PAP. Should be mailed to the clinic within the next month. Upon delivery patient will increase to 1 mg weekly.  -Continued SGLT2-I Farxiga (dapagliflozin) to 10 mg. Counseled on sick day rules. -Continued metformin 500 mg XR 2 tablets BID.  -Patient educated on purpose, proper use, and potential adverse effects.  -Extensively discussed pathophysiology of diabetes, recommended lifestyle interventions, dietary effects on blood sugar control.  -Counseled on s/sx of and management of hypoglycemia.  -Next A1c anticipated may 2024.

## 2022-08-30 NOTE — Patient Instructions (Signed)
Your new apple password and FreeStyle Elenor Legato password is [id number]!  It was a pleasure seeing you today!  The sensor is small waterproof disc that is placed on the back of the upper arm.  There is a very thin filament that is inserted under the surface of the skin and measures the amount of glucose in the interstitial fluid.  This system collects your sugar levels for up to 14 days and it automatically records the glucose level every 15 minutes. This will show your provider any patterns in your glucose levels.  Please remember... 1. Sensor will last 14 days 2. Sensor should be applied to area away from scarring, tattoos, irritation, and bones. 3. Starting the sensor: 1 hour warm up before BG readings available   4. Scan the sensor at least every 8 hours for the FreeStyle Libre 2. Libre 3 does not require scanning.  5. Hold reader within 1.5 inches of sensor to scan 6. When the blood drop and magnifying glass symbol appears, test fingerstick blood glucose prior to making treatment decisions 7. Do a fingerstick blood glucose test if the sensor readings do not match how you feel 8. Remove sensor prior to magnetic resonance imaging (MRI), computed tomography (CT) scan, or high-frequency electrical heat (diathermy) treatment. 9. Freestyle Libre may be worn through a Environmental education officer. It may not be exposed to an advanced Imaging Technology (AIT) body scanner (also called a millimeter wave scanner) or the baggage x-ray machine. Instead, ask for hand-wanding or full-body pat-down and visual inspection.  10. Doses of vitamin C (ascorbic acid) >500 mg every day may cause false high readings. 11. Do not submerge more than 3 feet or keep underwater longer than 30 minutes at a time. Gently pat to dry.  12. Store sensor kit between 39 and 77 degrees Farenheit. Can be refrigerated within this temperature range.  Problems with Freestyle Libre sticking? 1. Order Tegaderm I.V. films to place directly  over [facility] sensor on arm. 2. May also order Skin Tac from [employer]. Alcohol swab area you plan to administer Freestyle Libre then let dry. Once dry, apply Skin Tac in a circular motion (with a spot in the middle for sensor without skin tac) and let dry. Once dry you can apply Freestyle Libre   Problems taking off Freestyle [place]? 1. Remember to try to shower before removing Freestyle Libre 2. Order Tac Away to help remove any extra adhesive left on your skin once you remove Freestyle Libre 3. May also try baby oil to loosen adhesive  [place] Phone number: [phone] Available 7 days a week; excluding holidays 8 AM to 8PM EST  [email or web address]  Please do the following:  Continue checking blood sugars at home. It's really important that you record these and bring these in to your next doctor's appointment.  Continue making the lifestyle changes we've discussed together during our visit. Diet and exercise play a significant role in improving your blood sugars.  Follow-up with me/PCP in .   Hypoglycemia or low blood sugar:   Low blood sugar can happen quickly and may become an emergency if not treated right away.   While this shouldn't happen often, it can be brought upon if you skip a meal or do not eat enough. Also, if your insulin or other diabetes medications are dosed too high, this can cause your blood sugar to go to low.   Warning signs of low blood sugar include: Feeling shaky or dizzy Feeling weak or  tired  Excessive hunger Feeling anxious or upset  Sweating even when you aren't exercising  What to do if I experience low blood sugar? Follow the Rule of 15 Check your blood sugar. If lower than 70, proceed to step 2.  Treat with 15 grams of fast acting carbs which is found in 3-4 glucose tablets. If none are available you can try hard candy, 1 tablespoon of sugar or honey,4 ounces of fruit juice, or 6 ounces of REGULAR soda.  Re-check your  sugar in 15 minutes. If it is still below 70, do what you did in step 2 again. If your blood sugar has come back up, go ahead and eat a snack or small meal made up of complex carbs (ex. Whole grains) and protein at this time to avoid recurrence of low blood sugar.

## 2022-08-30 NOTE — Assessment & Plan Note (Signed)
Hypertension longstanding currently controlled. Blood pressure goal of <130/80 mmHg. Medication adherence appears good. Reading today upon re-check was 127/63 mmHg. -Continue enalapril 40 mg daily -Continue clonidine 0.1 mg BID -Continue amlodipine 10 mg daily

## 2022-08-30 NOTE — Progress Notes (Signed)
S:     Chief Complaint  Patient presents with   Medication Management    Liberate Study - initial visit   67 y.o. male who presents for diabetes evaluation, education, and management in the context of the LIBERATE Study.   PMH is significant for T2DM, hyperlipidemia, HTN and h/o stroke.  Patient was referred and last seen by Primary Care Provider, Dr. Arby Barrette, on 08/24/2022.  At last visit, patient was titrated on Ozempic (semaglutide) from 0.5 mg to 1 mg weekly however, patient continues on 0.33m once weekly and has > 4 doses remaining. Patient was also started on Farxiga (dapagliflozin) 10 mg daily.   Today, patient arrives in good spirits and presents without any assistance.   Patient reports Diabetes was diagnosed in 2008.   Current diabetes medications include: Farxiga (dapagliflozin) 10 gm daily, Metformin 500 mg XR 2 tablets BID, Ozempic (semaglutide) 0.5 mg weekly.  Current hypertension medications include: amlodipine 10 mg daily, clonidine 0.1 mg tablet BID, enalapril 40 mg daily Current hyperlipidemia medications include: rosuvastatin 20 mg daily, aspirin 81 mg daily  Patient denies adherence with all medications. He has not titrated up on his Ozempic (semaglutide) from 0.5 mg to 1 mg yet. Patient reports adherence to everything else.  [facility]  Patient denies hypoglycemic events.  Patient denies nocturia (nighttime urination).  Patient denies neuropathy (nerve pain). Patient reports visual changes in left eye. Patient reports self foot exams.   O:   Review of Systems  All other systems reviewed and are negative.   Physical Exam Constitutional:      Appearance: Normal appearance.  Pulmonary:     Effort: Pulmonary effort is normal.     Breath sounds: Normal breath sounds.  Neurological:     Mental Status: He is alert.  Psychiatric:        Mood and Affect: Mood normal.        Behavior: Behavior normal.        Thought Content: Thought  content normal.        Judgment: Judgment normal.   Lab Results  Component Value Date   HGBA1C 10.0 (A) 08/24/2022    POC A1c Today: 10.2  Vitals:   08/30/22 1001  BP: (!) 142/71  Pulse: 68  SpO2: 99%     Lipid Panel     Component Value Date/Time   CHOL 99 (L) 08/24/2022 1056   TRIG 122 08/24/2022 1056   HDL 48 08/24/2022 1056   CHOLHDL 2.1 08/24/2022 1056   CHOLHDL 2.5 10/26/2014 1118   VLDL 20 10/26/2014 1118   LDLCALC 29 08/24/2022 1056   LDLDIRECT 72 06/04/2012 0948    Clinical Atherosclerotic Cardiovascular Disease (ASCVD): Yes  The ASCVD Risk score (Arnett DK, et al., 2019) failed to calculate for the following reasons:   The valid total cholesterol range is 130 to 320 mg/dL   A/P:  LIBERATE Study:  -Patient provided verbal consent to participate in the study. Consent documented in electronic medical record.  -Provided education on Libre 3 CGM. Collaborated to ensure LElenor Legato3 app was downloaded on patient's phone. Educated on how to place sensor every 14 days, patient placed first sensor correctly and verbalized understanding of use, removal, and how to place next sensor. Discussed alarms. 8 sensors provided for a 3 month supply. Educated to contact the office if the sensor falls off early and replacements are needed [facility]   Diabetes longstanding currently uncontrolled with an A1c of  10.2 Patient is able to verbalize appropriate hypoglycemia management plan. Medication adherence appears adequate. Control is suboptimal due to polypharmacy and confusion with dose changes. -Continue GLP-1 Ozempic (semaglutide) at 0.5 mg weekly. Patient has not yet received 1 mg pen from [facility]. Should be mailed to the clinic within the next month. Upon delivery patient will increase to 1 mg weekly.  -Continued SGLT2-I Farxiga (dapagliflozin) to 10 mg. Counseled on sick day rules. -Continued metformin 500 mg XR 2 tablets BID.  -Patient educated on purpose,  proper use, and potential adverse effects.  -Extensively discussed pathophysiology of diabetes, recommended lifestyle interventions, dietary effects on blood sugar control.  -Counseled on s/sx of and management of hypoglycemia.  -Next A1c anticipated may 2024.   ASCVD risk - secondary prevention in patient with diabetes. Last LDL is close to goal at 72; but not quite at goal of <70 mg/dL. ASCVD risk factors include h/o stroke, T2DM, HTN, and HLD. High intensity statin indicated.  -Continued rosuvastatin 20 mg.  -Continued aspirin 81 mg daily.  Hypertension longstanding currently controlled. Blood pressure goal of <130/80 mmHg. Medication adherence appears good. Reading today upon re-check was 127/63 mmHg. -Continue enalapril 40 mg daily. -Continue clonidine 0.1 mg BID -Continue amlodipine 10 mg daily  Written patient instructions provided. Patient verbalized understanding of treatment plan.  Total time in face to face counseling 35 minutes.    Follow-up:  Pharmacist 10/11/2022.  Patient seen with Dixon Boos, PharmD Candidate, Francena Hanly, PharmD, PGY1 Pharmacy Resident, Joseph Art, PharmD, PGY2 Pharmacy Resident.

## 2022-09-03 ENCOUNTER — Telehealth: Payer: Self-pay

## 2022-09-03 MED ORDER — LEVOTHYROXINE SODIUM 75 MCG PO TABS: 75.0000 ug | ORAL_TABLET | Freq: Every day | ORAL | 0 refills | Status: DC

## 2022-09-03 NOTE — Telephone Encounter (Signed)
Patient calls nurse line in regards to Levothyroxine.   He reports he spoke with [facility] and they report not reviewing medication.   Will resend.

## 2022-09-10 ENCOUNTER — Telehealth: Payer: Self-pay | Admitting: Pharmacist

## 2022-09-10 ENCOUNTER — Telehealth: Payer: Self-pay

## 2022-09-10 MED ORDER — LEVOTHYROXINE SODIUM 75 MCG PO TABS: 75.0000 ug | ORAL_TABLET | Freq: Every day | ORAL | 0 refills | Status: DC

## 2022-09-10 NOTE — Telephone Encounter (Signed)
Patient calls nurse line reporting continued difficulty getting Levothyroxine from [facility].   Patient reports he has ~ 3 days left. He asks we [facility] to [facility].   He reports going forward he will no longer being using [facility]. He reports he will be switching to [facility]. I have updated his preferred pharmacy.   Will send in a 30 day supply of [facility] to [facility].

## 2022-09-10 NOTE — Telephone Encounter (Signed)
Contacted patient for 7 day follow-up of CGM LIBERATE study.   All readings on CGM are "high" Reports taking Metformin, dapagliflozin and (Ozempic) semaglutide.  Patient reports taking Ozempic 0.'25mg'$  weekly (I clarified that the 0.'25mg'$  dose is lower than the 0.'5mg'$  dose).   He agreed to increase his Ozempic (semaglutide) dose gradually.  I asked him to increase by 5 clicks (higher than [id number]) when he give his next shot on Sunday.   I will plan to contact him again next week to interpret the results and plan to continue dose titration.

## 2022-09-11 ENCOUNTER — Telehealth: Payer: Self-pay

## 2022-09-11 NOTE — Telephone Encounter (Signed)
Reviewed and agree with Dr Graylin Shiver plan.

## 2022-09-11 NOTE — Telephone Encounter (Signed)
Left message informing patient his az&me shipment is ready for pickup.  3 bottles of farxiga are labeled and ready in med room - on counter

## 2022-09-12 NOTE — Telephone Encounter (Signed)
Patient presents to nurse clinic. Provided with samples per note from [place].   Talbot Grumbling, RN

## 2022-09-21 ENCOUNTER — Other Ambulatory Visit: Payer: Self-pay

## 2022-09-21 MED ORDER — LEVOTHYROXINE SODIUM 75 MCG PO TABS: 75.0000 ug | ORAL_TABLET | Freq: Every day | ORAL | 0 refills | Status: DC

## 2022-09-21 NOTE — Telephone Encounter (Signed)
Patient calls nurse line requesting refill on levothyroxine. He reports that he was told to call back in to request 90 day supply.   He is requesting that [facility] day supply [facility].   Talbot Grumbling, RN

## 2022-09-25 NOTE — Telephone Encounter (Signed)
Called to f/u with patients ozempic p/u.  Patient picked up ozempic on same date he presented to [place].   Rec'd 5 boxes of ozempic 0.5mg  dose pens.  Will fax dose increase for 1mg  dose pens.

## 2022-09-28 ENCOUNTER — Telehealth: Payer: Self-pay | Admitting: Family Medicine

## 2022-09-28 NOTE — Telephone Encounter (Signed)
Saw a note in my box about patient needing a refill of his Januvia and asked me to call him. Called to get more info on what he is needing but he did not answer. Left voicemail.

## 2022-09-28 NOTE — Telephone Encounter (Signed)
Patient calls nurse line reporting he just missed a call.   Will forward to PCP.

## 2022-09-28 NOTE — Telephone Encounter (Signed)
Called patient back. States blood sugars have been going high - states he had a fasting sugar in the 300s. Taking Ozempic .5mg , Januvia, Metformin twice daily. States when his sugars are high he takes an extra Januvia or Metofrmin. Discussed not taking extra of those medications and told him to start checking fasting sugars in the morning and bring those numbers in to our follow up appointment next week. Discussed he may need to start insulin. Will follow up at our appt on 3/26

## 2022-09-28 NOTE — Telephone Encounter (Signed)
Patient returns call to nurse line. He is asking to speak with Dr. Arby Barrette regarding continued elevated blood sugar levels, despite taking all of his medications.   Requesting returned call at [phone].  Talbot Grumbling, RN

## 2022-10-02 ENCOUNTER — Ambulatory Visit (INDEPENDENT_AMBULATORY_CARE_PROVIDER_SITE_OTHER): Payer: Medicare HMO

## 2022-10-02 ENCOUNTER — Other Ambulatory Visit: Payer: Self-pay

## 2022-10-02 VITALS — Ht 73.0 in | Wt 201.0 lb

## 2022-10-02 DIAGNOSIS — Z0000 Encounter for general adult medical examination without abnormal findings: Secondary | ICD-10-CM | POA: Diagnosis not present

## 2022-10-02 MED ORDER — LEVOTHYROXINE SODIUM 75 MCG PO TABS: 75.0000 ug | ORAL_TABLET | Freq: Every day | ORAL | 0 refills | Status: DC

## 2022-10-02 NOTE — Progress Notes (Addendum)
Subjective:   Andrew [name] is a 67 y.o. male who presents for Medicare Annual/Subsequent preventive examination.  Review of Systems    Virtual Visit via Telephone Note  I connected with  Andrew [name] on 10/02/22 at  9:15 AM EDT by telephone and verified that I am speaking with the correct person using two identifiers.  Location: Patient: Home Provider: Office Persons participating in the virtual visit: patient/Nurse Health Advisor   I discussed the limitations, risks, security and privacy concerns of performing an evaluation and management service by telephone and the availability of in person appointments. The patient expressed understanding and agreed to proceed.  Interactive audio and video telecommunications were attempted between this nurse and patient, however failed, due to patient having technical difficulties OR patient did not have access to video capability.  We continued and completed visit with audio only.  Some vital signs may be absent or patient reported.   Criselda Peaches, LPN  Cardiac Risk Factors include: advanced age (>14men, >66 women);diabetes mellitus;male gender;hypertension     Objective:    Today's Vitals   10/02/22 0905 10/02/22 0906  Weight: 201 lb (91.2 kg)   Height: 6\' 1"  (1.854 m)   PainSc:  0-No pain   Body mass index is 26.52 kg/m.     10/02/2022    9:13 AM 05/25/2022    1:33 PM 03/29/2022    2:52 PM 12/15/2021    8:28 AM 11/06/2021   10:02 AM 10/11/2021    3:32 PM 09/26/2021    1:58 PM  Advanced Directives  Does Patient Have a Medical Advance Directive? No No No No No No No  Would patient like information on creating a medical advance directive? No - Patient declined No - Patient declined  No - Patient declined No - Patient declined No - Patient declined No - Patient declined    Current Medications (verified) Outpatient Encounter Medications as of 10/02/2022  Medication Sig   Accu-Chek Softclix Lancets lancets USE TO TEST  BLOOD SUGAR FIVE  TIMES PER DAY   Acetaminophen (TYLENOL ARTHRITIS EXT RELIEF PO) Take 650 mg by mouth 1 day or 1 dose.   Alcohol Swabs PADS Please use prior to checking blood sugar levels up to two times daily.   allopurinol (ZYLOPRIM) 100 MG tablet TAKE 2 TABLETS BY MOUTH EVERY DAY   amLODipine (NORVASC) 10 MG tablet TAKE 1 TABLET BY MOUTH DAILY   aspirin EC 81 MG tablet Take 1 tablet (81 mg total) by mouth daily.   benzoyl peroxide 5 % external liquid Apply topically 2 (two) times daily. (Patient not taking: Reported on 08/30/2022)   Blood Glucose Calibration (TRUE METRIX LEVEL 1) Low SOLN Please use to calibrate glucometer.   Blood Glucose Calibration (TRUE METRIX LEVEL 2) Normal SOLN Please use to calibrate glucometer.   Blood Glucose Calibration (TRUE METRIX LEVEL 3) High SOLN Please use to calibrate glucometer.   Blood Glucose Monitoring Suppl (ACCU-CHEK GUIDE) w/Device KIT Use to check blood sugar 3x per day. E11.9   cholecalciferol (VITAMIN D) 25 MCG (1000 UNIT) tablet TAKE 1 TABLET EVERY DAY   cloNIDine (CATAPRES) 0.1 MG tablet TAKE 1 TABLET BY MOUTH 2 TIMES DAILY.   dapagliflozin propanediol (FARXIGA) 10 MG TABS tablet Take 1 tablet (10 mg total) by mouth daily.   enalapril (VASOTEC) 20 MG tablet Take 2 tablets (40 mg total) by mouth daily.   fluticasone (FLONASE) 50 MCG/ACT nasal spray SPRAY 2 SPRAYS INTO EACH NOSTRIL EVERY DAY  gabapentin (NEURONTIN) 100 MG capsule Take 1 capsule (100 mg total) by mouth at bedtime.   Ginger, Zingiber officinalis, (GINGER PO) Take 1 each by mouth daily. Boils fresh ginger and drinks as tea.   glucose blood (ACCU-CHEK GUIDE) test strip Use to check blood sugar 3x per day. E11.9   latanoprost (XALATAN) 0.005 % ophthalmic solution Place 1 drop into both eyes at bedtime.   levothyroxine (SYNTHROID) 75 MCG tablet Take 1 tablet (75 mcg total) by mouth daily before breakfast. Take 1 tablet (75 mcg total) by mouth daily before breakfast   meloxicam (MOBIC)  15 MG tablet TAKE 1 TABLET BY MOUTH DAILY   metFORMIN (GLUCOPHAGE-XR) 500 MG 24 hr tablet TAKE 2 TABLETS BY MOUTH TWICE  DAILY   rosuvastatin (CRESTOR) 20 MG tablet Take 1 tablet (20 mg total) by mouth daily.   Semaglutide, 1 MG/DOSE, 4 MG/3ML SOPN Inject 1 mg into the skin once a week.   tadalafil (CIALIS) 20 MG tablet Take 0.5 tablets (10 mg total) by mouth every other day as needed for erectile dysfunction. (Patient not taking: Reported on 08/30/2022)   tamsulosin (FLOMAX) 0.4 MG CAPS capsule Take 2 capsules (0.8 mg total) by mouth daily.   traMADol (ULTRAM) 50 MG tablet Take 1 tablet (50 mg total) by mouth every 8 (eight) hours as needed.   Turmeric POWD Take 5 mLs by mouth daily. Mixes 1 teaspoon in water every day and drinks it. Uses for arthritis.   No facility-administered encounter medications on file as of 10/02/2022.    Allergies (verified) Metoprolol succinate, Penicillins, and Empagliflozin   History: Past Medical History:  Diagnosis Date   Arthritis    Chronic left shoulder pain 09/20/2017   CKD (chronic kidney disease) stage 3, GFR 30-59 ml/min (HCC) 09/05/2006   CRD (chronic renal disease), stage II    Baseline Cr 1.2   CVA (cerebral vascular accident) ([place])    x4 last one in 2007, R sided residual weakness   DSORD, ADJST W/MIXED ANXIETY/DEPRESSED MOOD 10/18/2006   Qualifier: Diagnosis of  By: Dalbert Mayotte     ERECTILE DYSFUNCTION 10/18/2006   Qualifier: Diagnosis of  By: Dalbert Mayotte     HAMMER TOE 07/25/2010   HLD (hyperlipidemia)    HTN (hypertension)    Hypothyroidism    Leg swelling    Lipoma of back 03/18/2011   Personal history of colonic adenomas 04/01/2013   Primary osteoarthritis of right knee 05/21/2018   Prostatitis    hx of x2   PSEUDOFOLLICULITIS BARBAE [id number]   Qualifier: Diagnosis of  By: Danise Mina  MD, Javier     Sarcoidosis    Status post right knee replacement 05/15/2017   T2DM (type 2 diabetes mellitus) ([place])    Past Surgical History:   Procedure Laterality Date   COLONOSCOPY  03/24/2013   COLONOSCOPY W/ POLYPECTOMY     ERCP  2006   Normal   ESOPHAGOGASTRODUODENOSCOPY     EYE SURGERY Bilateral    cataracts   HERNIA REPAIR  1985, 1995   x2 'inguinal   LIPOMA EXCISION  06/22/2011   Procedure: EXCISION LIPOMA;  Surgeon: Rolm Bookbinder, MD;  Location: [place];  Service: General;  Laterality: N/A;   TOOTH EXTRACTION     TOTAL KNEE ARTHROPLASTY Left 05/15/2017   Procedure: LEFT TOTAL KNEE ARTHROPLASTY;  Surgeon: Leandrew Koyanagi, MD;  Location: [place];  Service: Orthopedics;  Laterality: Left;   TOTAL KNEE ARTHROPLASTY Right 06/09/2018   Procedure: RIGHT TOTAL KNEE ARTHROPLASTY;  Surgeon: Leandrew Koyanagi, MD;  Location: [place];  Service: Orthopedics;  Laterality: Right;   TRANSTHORACIC ECHOCARDIOGRAM  2005   EF 60%   Family History  Problem Relation Age of Onset   Bone cancer Father    Cancer Father 68       bone   Uterine cancer Mother    Cancer Mother    Heart attack Brother    Kidney disease Brother    Diabetes Brother    Arthritis Brother    Heart disease Sister    Congestive Heart Failure Sister    Congestive Heart Failure Sister    Congestive Heart Failure Sister    Cancer Sister    Diabetes Sister    Arthritis Sister    Breast cancer Sister    Colon cancer Neg Hx    Rectal cancer Neg Hx    Stomach cancer Neg Hx    Colon polyps Neg Hx    Esophageal cancer Neg Hx    Social History   Socioeconomic History   Marital status: Divorced    Spouse name: Not on file   Number of children: 1   Years of education: 14   Highest education level: Associate degree: academic program  Occupational History   Occupation: retired    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 Remote etoh and tobacco history, stopped 20 years ago. 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      Lives in home with son, and dog. Home is one level with 3 steps in go in. Has hand rails. No smoke alarms. Will contact fire department to seek assistance on smoke alarms. No grab-bars in bathroom. Throw rugs are flat and attached. Walks the dog daily for exercise.   Always wears seat belt in car.      Patient does state some financial insecurity around paying for his medications, including co-pays. Had to stop insulin due to cost.    Patient needs to have his dentures modified but has not dental coverage. Will ask LCSW to reach out with resources.     Social Determinants of Health   Financial Resource Strain: Low Risk  (10/02/2022)   Overall Financial Resource Strain (CARDIA)    Difficulty of Paying Living Expenses: Not hard at all  Food Insecurity: No Food Insecurity (10/02/2022)   Hunger Vital Sign    Worried About Running Out of Food in the Last Year: Never true    Ran Out of Food in the Last Year: Never true  Transportation Needs: No Transportation Needs (10/02/2022)   PRAPARE - Hydrologist (Medical): No    Lack of Transportation (Non-Medical): No  Physical Activity: Insufficiently Active (10/02/2022)   Exercise Vital Sign    Days of Exercise per Week: 5 days    Minutes of Exercise per Session: 20 min  Stress: No Stress Concern Present (10/02/2022)   [place]    Feeling of Stress : Not at all  Social Connections:  Moderately Integrated (10/02/2022)   Social Connection and Isolation Panel [NHANES]    Frequency of Communication with Friends and Family: More than three times a week    Frequency of Social Gatherings with  Friends and Family: More than three times a week    Attends Religious Services: More than 4 times per year    Active Member of [employer] or Organizations: Yes    Attends Music therapist: More than 4 times per year    Marital Status: Divorced    Tobacco Counseling Counseling given: Not Answered Tobacco comments: Never smoker; no plans to start   Clinical Intake:  Pre-visit preparation completed: No  Pain : No/denies pain Pain Score: 0-No pain     BMI - recorded: 26.52 Nutritional Status: BMI 25 -29 Overweight Nutritional Risks: None Diabetes: No  How often do you need to have someone help you when you read instructions, pamphlets, or other written materials from your doctor or pharmacy?: 1 - Never  Diabetic? Yes  Interpreter Needed?: No  Information entered by :: Rolene Arbour LPNNutrition Risk Assessment:  Has the patient had any N/V/D within the last 2 months?  No  Does the patient have any non-healing wounds?  No  Has the patient had any unintentional weight loss or weight gain?  No   Diabetes:  Is the patient diabetic?  Yes  If diabetic, was a CBG obtained today?  Yes CBG 242 Taken by patient Did the patient bring in their glucometer from home?  No  How often do you monitor your CBG's? Daily.   Financial Strains and Diabetes Management:  Are you having any financial strains with the device, your supplies or your medication? No .  Does the patient want to be seen by Chronic Care Management for management of their diabetes?  No  Would the patient like to be referred to a Nutritionist or for Diabetic Management?  No   Diabetic Exams:  Diabetic Eye Exam: Completed Yes. Overdue for diabetic eye exam. Pt has been advised about the importance in completing this exam. A referral has been placed today. Message sent to referral coordinator for scheduling purposes. Advised pt to expect a call from office referred to regarding appt.  Diabetic Foot Exam:  Completed Yes. Pt has been advised about the importance in completing this exam. Pt is scheduled for diabetic foot exam on Followed by PCP.     Activities of Daily Living    10/02/2022    9:12 AM  In your present state of health, do you have any difficulty performing the following activities:  Hearing? 0  Vision? 0  Difficulty concentrating or making decisions? 0  Walking or climbing stairs? 0  Dressing or bathing? 0  Doing errands, shopping? 0  Preparing Food and eating ? N  Using the Toilet? N  In the past six months, have you accidently leaked urine? N  Do you have problems with loss of bowel control? N  Managing your Medications? N  Managing your Finances? N  Housekeeping or managing your Housekeeping? N    Patient Care Team: [name], DO as PCP - General (Family Medicine) Rutherford Guys, MD as Consulting Physician (Ophthalmology) Gatha Mayer, MD as Consulting Physician (Gastroenterology) Leandrew Koyanagi, MD as Attending Physician (Orthopedic Surgery) Kennith Center, RD as Dietitian (Family Medicine)  Indicate any recent Medical Services you may have received from other than [facility] providers in the past year (date may be approximate).     Assessment:  This is a routine wellness examination for [place].  Hearing/Vision screen Hearing Screening - Comments:: Denies hearing difficulties   Vision Screening - Comments:: Wears rx glasses - up to date with routine eye exams with  Dr Gershon Crane  Dietary issues and exercise activities discussed: Current Exercise Habits: Home exercise routine, Type of exercise: strength training/weights, Time (Minutes): 20, Frequency (Times/Week): 5, Weekly Exercise (Minutes/Week): 100, Intensity: Mild, Exercise limited by: None identified   Goals Addressed               This Visit's Progress     No current goals (pt-stated)         Depression Screen    10/02/2022    9:09 AM 05/25/2022    1:33 PM 03/29/2022    2:51 PM 12/15/2021     8:28 AM 11/06/2021   10:01 AM 10/11/2021    3:32 PM 09/26/2021    1:58 PM  PHQ 2/9 Scores  PHQ - 2 Score 0 0 0 0 0 0 0  PHQ- 9 Score  0 0 0 0 0 0    Fall Risk    10/02/2022    9:13 AM 05/25/2022    1:33 PM 03/29/2022    2:52 PM 12/15/2021    8:28 AM 10/11/2021    3:32 PM  Fall Risk   Falls in the past year? 0 0 0 0 0  Number falls in past yr: 0 0 0 0 0  Injury with Fall? 0 0  0 0  Risk for fall due to : No Fall Risks      Follow up Falls prevention discussed  Falls evaluation completed Falls evaluation completed     [place]:  Any stairs in or around the home? Yes  If so, are there any without handrails? No  Home free of loose throw rugs in walkways, pet beds, electrical cords, etc? Yes  Adequate lighting in your home to reduce risk of falls? Yes   ASSISTIVE DEVICES UTILIZED TO PREVENT FALLS:  Life alert? No  Use of a cane, walker or w/c? No  Grab bars in the bathroom? No  Shower chair or bench in shower? No  Elevated toilet seat or a handicapped toilet? No   TIMED UP AND GO:  Was the test performed? No . Audio Visit   Cognitive Function:    04/10/2018    4:30 PM  MMSE - Mini Mental State Exam  Orientation to time 5  Orientation to Place 5  Registration 3  Attention/ Calculation 5  Recall 2  Language- name 2 objects 2  Language- repeat 1  Language- follow 3 step command 3  Language- read & follow direction 1  Write a sentence 1  Copy design 1  Total score 29        10/02/2022    9:13 AM 04/10/2018    4:31 PM  6CIT Screen  What Year? 0 points 0 points  What month? 0 points 0 points  What time? 0 points 0 points  Count back from 20 0 points 0 points  Months in reverse 2 points 2 points  Repeat phrase 4 points 0 points  Total Score 6 points 2 points    Immunizations Immunization History  Administered Date(s) Administered   Fluad Quad(high Dose 65+) 03/23/2021, 04/02/2022   Influenza Split 04/30/2011, 03/21/2012    Influenza Whole 05/12/2007, 04/28/2009, 07/25/2010   Influenza,inj,Quad PF,6+ Mos 03/20/2013, 04/14/2014, 03/24/2015, 04/03/2016, 03/15/2017, 03/05/2018, 03/05/2019, 04/01/2020   PFIZER  Comirnaty(Gray Top)Covid-19 Tri-Sucrose Vaccine 10/12/2020   PFIZER(Purple Top)SARS-COV-2 Vaccination 09/26/2019, 10/23/2019, 04/28/2020   PNEUMOCOCCAL CONJUGATE-20 02/17/2021   Pfizer Covid-19 Vaccine Bivalent Booster 16yrs & up 04/18/2021   Pneumococcal Conjugate-13 05/10/2015   Pneumococcal Polysaccharide-23 04/14/2014   RSV,unspecified 07/25/2022   Td 05/09/2004   Zoster Recombinat (Shingrix) 03/20/2021, 07/11/2022   Zoster, Live 04/30/2016   Zoster, Unspecified 04/30/2016    TDAP status: Due, Education has been provided regarding the importance of this vaccine. Advised may receive this vaccine at local pharmacy or Health Dept. Aware to provide a copy of the vaccination record if obtained from local pharmacy or Health Dept. Verbalized acceptance and understanding.  Flu Vaccine status: Up to date  Pneumococcal vaccine status: Up to date  Covid-19 vaccine status: Completed vaccines  Qualifies for Shingles Vaccine? Yes   Zostavax completed Yes   Shingrix Completed?: Yes  Screening Tests Health Maintenance  Topic Date Due   DTaP/Tdap/Td (2 - Tdap) 05/09/2014   Diabetic kidney evaluation - Urine ACR  10/03/2022 (Originally 01/29/1974)   FOOT EXAM  10/03/2022 (Originally 04/28/2021)   COVID-19 Vaccine (6 - 2023-24 season) 10/18/2022 (Originally 03/09/2022)   HEMOGLOBIN A1C  02/28/2023   OPHTHALMOLOGY EXAM  03/08/2023   Diabetic kidney evaluation - eGFR measurement  08/25/2023   Medicare Annual Wellness (AWV)  10/02/2023   COLONOSCOPY (Pts 45-42yrs Insurance coverage will need to be confirmed)  05/20/2028   Pneumonia Vaccine 3+ Years old  Completed   INFLUENZA VACCINE  Completed   Hepatitis C Screening  Completed   Zoster Vaccines- Shingrix  Completed   HPV VACCINES  Aged Out    Health  Maintenance  Health Maintenance Due  Topic Date Due   DTaP/Tdap/Td (2 - Tdap) 05/09/2014    Colorectal cancer screening: Type of screening: Colonoscopy. Completed 05/20/18. Repeat every 10 years  Lung Cancer Screening: (Low Dose CT Chest recommended if Age 75-80 years, 30 pack-year currently smoking OR have quit w/in 15years.) does not qualify.     Additional Screening:  Hepatitis C Screening: does qualify; Completed 12/19/15  Vision Screening: Recommended annual ophthalmology exams for early detection of glaucoma and other disorders of the eye. Is the patient up to date with their annual eye exam?  Yes  Who is the provider or what is the name of the office in which the patient attends annual eye exams? Dr Gershon Crane If pt is not established with a provider, would they like to be referred to a provider to establish care? No .   Dental Screening: Recommended annual dental exams for proper oral hygiene  Community Resource Referral / Chronic Care Management:  CRR required this visit?  No   CCM required this visit?  No      Plan:     I have personally reviewed and noted the following in the patient's chart:   Medical and social history Use of alcohol, tobacco or illicit drugs  Current medications and supplements including opioid prescriptions. Patient is not currently taking opioid prescriptions. Functional ability and status Nutritional status Physical activity Advanced directives List of other physicians Hospitalizations, surgeries, and ER visits in previous 12 months Vitals Screenings to include cognitive, depression, and falls Referrals and appointments  In addition, I have reviewed and discussed with patient certain preventive protocols, quality metrics, and best practice recommendations. A written personalized care plan for preventive services as well as general preventive health recommendations were provided to patient.     Criselda Peaches, LPN   [id number]    Nurse Notes:  Patient due Diabetic kidney evaluation-Urine ACR    I have reviewed this visit and agree with the documentation.  [place]

## 2022-10-02 NOTE — Patient Instructions (Addendum)
[name] , Thank you for taking time to come for your Medicare Wellness Visit. I appreciate your ongoing commitment to your health goals. Please review the following plan we discussed and let me know if I can assist you in the future.   These are the goals we discussed:  Goals       Blood Pressure < 130/80      HEMOGLOBIN A1C < 6.5      No current goals (pt-stated)      Weight (lb) < 200 lb (90.7 kg) (pt-stated)      Did get to 197 lbs but back to 202.        This is a list of the screening recommended for you and due dates:  Health Maintenance  Topic Date Due   DTaP/Tdap/Td vaccine (2 - Tdap) 05/09/2014   Yearly kidney health urinalysis for diabetes  10/03/2022*   Complete foot exam   10/03/2022*   COVID-19 Vaccine (6 - 2023-24 season) 10/18/2022*   Hemoglobin A1C  02/28/2023   Eye exam for diabetics  03/08/2023   Yearly kidney function blood test for diabetes  08/25/2023   Medicare Annual Wellness Visit  10/02/2023   Colon Cancer Screening  05/20/2028   Pneumonia Vaccine  Completed   Flu Shot  Completed   Hepatitis C Screening: USPSTF Recommendation to screen - Ages 18-79 yo.  Completed   Zoster (Shingles) Vaccine  Completed   HPV Vaccine  Aged Out  *Topic was postponed. The date shown is not the original due date.    Advanced directives: Advance directive discussed with you today. Even though you declined this today, please call our office should you change your mind, and we can give you the proper paperwork for you to fill out.   Conditions/risks identified: None  Next appointment: Follow up in one year for your annual wellness visit.   Preventive Care 67 Years and Older, Male  Preventive care refers to lifestyle choices and visits with your health care provider that can promote health and wellness. What does preventive care include? A yearly physical exam. This is also called an annual well check. Dental exams once or twice a year. Routine eye exams. Ask your  health care provider how often you should have your eyes checked. Personal lifestyle choices, including: Daily care of your teeth and gums. Regular physical activity. Eating a healthy diet. Avoiding tobacco and drug use. Limiting alcohol use. Practicing safe sex. Taking low doses of aspirin every day. Taking vitamin and mineral supplements as recommended by your health care provider. What happens during an annual well check? The services and screenings done by your health care provider during your annual well check will depend on your age, overall health, lifestyle risk factors, and family history of disease. Counseling  Your health care provider may ask you questions about your: Alcohol use. Tobacco use. Drug use. Emotional well-being. Home and relationship well-being. Sexual activity. Eating habits. History of falls. Memory and ability to understand (cognition). Work and work Statistician. Screening  You may have the following tests or measurements: Height, weight, and BMI. Blood pressure. Lipid and cholesterol levels. These may be checked every 5 years, or more frequently if you are over 54 years old. Skin check. Lung cancer screening. You may have this screening every year starting at age 34 if you have a 30-pack-year history of smoking and currently smoke or have quit within the past 15 years. Fecal occult blood test (FOBT) of the stool. You may have  this test every year starting at age 17. Flexible sigmoidoscopy or colonoscopy. You may have a sigmoidoscopy every 5 years or a colonoscopy every 10 years starting at age 15. Prostate cancer screening. Recommendations will vary depending on your family history and other risks. Hepatitis C blood test. Hepatitis B blood test. Sexually transmitted disease (STD) testing. Diabetes screening. This is done by checking your blood sugar (glucose) after you have not eaten for a while (fasting). You may have this done every 1-3  years. Abdominal aortic aneurysm (AAA) screening. You may need this if you are a current or former smoker. Osteoporosis. You may be screened starting at age 52 if you are at high risk. Talk with your health care provider about your test results, treatment options, and if necessary, the need for more tests. Vaccines  Your health care provider may recommend certain vaccines, such as: Influenza vaccine. This is recommended every year. Tetanus, diphtheria, and acellular pertussis (Tdap, Td) vaccine. You may need a Td booster every 10 years. Zoster vaccine. You may need this after age 33. Pneumococcal 13-valent conjugate (PCV13) vaccine. One dose is recommended after age 39. Pneumococcal polysaccharide (PPSV23) vaccine. One dose is recommended after age 1. Talk to your health care provider about which screenings and vaccines you need and how often you need them. This information is not intended to replace advice given to you by your health care provider. Make sure you discuss any questions you have with your health care provider. Document Released: 07/22/2015 Document Revised: 03/14/2016 Document Reviewed: 04/26/2015 Elsevier Interactive Patient Education  2017 [place] Prevention in the Home Falls can cause injuries. They can happen to people of all ages. There are many things you can do to make your home safe and to help prevent falls. What can I do on the outside of my home? Regularly fix the edges of walkways and driveways and fix any cracks. Remove anything that might make you trip as you walk through a door, such as a raised step or threshold. Trim any bushes or trees on the path to your home. Use bright outdoor lighting. Clear any walking paths of anything that might make someone trip, such as rocks or tools. Regularly check to see if handrails are loose or broken. Make sure that both sides of any steps have handrails. Any raised decks and porches should have guardrails on the  edges. Have any leaves, snow, or ice cleared regularly. Use sand or salt on walking paths during winter. Clean up any spills in your garage right away. This includes oil or grease spills. What can I do in the bathroom? Use night lights. Install grab bars by the toilet and in the tub and shower. Do not use towel bars as grab bars. Use non-skid mats or decals in the tub or shower. If you need to sit down in the shower, use a plastic, non-slip stool. Keep the floor dry. Clean up any water that spills on the floor as soon as it happens. Remove soap buildup in the tub or shower regularly. Attach bath mats securely with double-sided non-slip rug tape. Do not have throw rugs and other things on the floor that can make you trip. What can I do in the bedroom? Use night lights. Make sure that you have a light by your bed that is easy to reach. Do not use any sheets or blankets that are too big for your bed. They should not hang down onto the floor. Have a firm chair  that has side arms. You can use this for support while you get dressed. Do not have throw rugs and other things on the floor that can make you trip. What can I do in the kitchen? Clean up any spills right away. Avoid walking on wet floors. Keep items that you use a lot in easy-to-reach places. If you need to reach something above you, use a strong step stool that has a grab bar. Keep electrical cords out of the way. Do not use floor polish or wax that makes floors slippery. If you must use wax, use non-skid floor wax. Do not have throw rugs and other things on the floor that can make you trip. What can I do with my stairs? Do not leave any items on the stairs. Make sure that there are handrails on both sides of the stairs and use them. Fix handrails that are broken or loose. Make sure that handrails are as long as the stairways. Check any carpeting to make sure that it is firmly attached to the stairs. Fix any carpet that is loose or  worn. Avoid having throw rugs at the top or bottom of the stairs. If you do have throw rugs, attach them to the floor with carpet tape. Make sure that you have a light switch at the top of the stairs and the bottom of the stairs. If you do not have them, ask someone to add them for you. What else can I do to help prevent falls? Wear shoes that: Do not have high heels. Have rubber bottoms. Are comfortable and fit you well. Are closed at the toe. Do not wear sandals. If you use a stepladder: Make sure that it is fully opened. Do not climb a closed stepladder. Make sure that both sides of the stepladder are locked into place. Ask someone to hold it for you, if possible. Clearly mark and make sure that you can see: Any grab bars or handrails. First and last steps. Where the edge of each step is. Use tools that help you move around (mobility aids) if they are needed. These include: Canes. Walkers. Scooters. Crutches. Turn on the lights when you go into a dark area. Replace any light bulbs as soon as they burn out. Set up your furniture so you have a clear path. Avoid moving your furniture around. If any of your floors are uneven, fix them. If there are any pets around you, be aware of where they are. Review your medicines with your doctor. Some medicines can make you feel dizzy. This can increase your chance of falling. Ask your doctor what other things that you can do to help prevent falls. This information is not intended to replace advice given to you by your health care provider. Make sure you discuss any questions you have with your health care provider. Document Released: 04/21/2009 Document Revised: 12/01/2015 Document Reviewed: 07/30/2014 Elsevier Interactive Patient Education  2017 [employer].

## 2022-10-11 ENCOUNTER — Ambulatory Visit: Payer: Medicare Other | Admitting: Pharmacist

## 2022-10-14 ENCOUNTER — Other Ambulatory Visit: Payer: Self-pay | Admitting: Family Medicine

## 2022-10-15 ENCOUNTER — Encounter: Payer: Self-pay | Admitting: Pharmacist

## 2022-10-15 ENCOUNTER — Ambulatory Visit (INDEPENDENT_AMBULATORY_CARE_PROVIDER_SITE_OTHER): Payer: Medicare HMO | Admitting: Pharmacist

## 2022-10-15 VITALS — BP 125/63 | HR 63 | Wt 197.4 lb

## 2022-10-15 DIAGNOSIS — I10 Essential (primary) hypertension: Secondary | ICD-10-CM | POA: Diagnosis not present

## 2022-10-15 DIAGNOSIS — E119 Type 2 diabetes mellitus without complications: Secondary | ICD-10-CM

## 2022-10-15 NOTE — Patient Instructions (Signed)
It was great seeing you today! Congrats on the weight loss, keep up the good work! Here are the medication changes we are making for you today:  STOP Januvia (sitagliptin) 100 mg daily  INCREASE dose of Ozempic (semaglutide) to 0.25 mg + 20 clicks weekly

## 2022-10-15 NOTE — Assessment & Plan Note (Signed)
Hypertension . Blood pressure goal of <130/80 mmHg. Medication adherence good. Blood pressure control is optimal due to in-office readings under goal. -Continued enalapril 20 mg BID -Continued amlodipine 10 mg daily -Continued clonidine 0.1 mg BID

## 2022-10-15 NOTE — Progress Notes (Signed)
S:     Chief Complaint  Patient presents with   Medication Management    T2DM   67 y.o. male who presents for diabetes evaluation, education, and management.  PMH is significant for T2DM.  Patient was referred and last seen by Primary Care Provider, Dr. Idalia Needle, on 08/24/22.  At last visit, Ozempic (semaglutide) was increased to 0.25 + 10 clicks weekly   Today, patient arrives in good spirits and presents without any assistance.  Patient reports Diabetes was diagnosed in 2008.   Current diabetes medications include: Farxiga (dapagliflozin) 10 mg daily, metformin-XR 1000 mg BID, Ozempic (semaglutide) 0.25 mg weekly Current hypertension medications include: Enalapril 20 mg BID, amlodipine 10 mg daily, clonidine 0.1 mg BID, tamsulosin 0.4 mg BID Current hyperlipidemia medications include: Rosuvastatin 20 mg daily  Patient reports adherence to taking all medications as prescribed.   Do you feel that your medications are working for you? yes Have you been experiencing any side effects to the medications prescribed? no Do you have any problems obtaining medications due to transportation or finances? no Insurance coverage: [employer]  Patient denies hypoglycemic events.  Patient denies nocturia (nighttime urination).  Patient denies neuropathy (nerve pain). Patient reports visual changes. Patient denies self foot exams.   Patient reported dietary habits: Eating smaller meals due to effects of Ozempic (semaglutide).  Within the past 12 months, did you worry whether your food would run out before you got money to buy more? no Within the past 12 months, did the food you bought run out, and you didn't have money to get more? no  O:   Review of Systems  All other systems reviewed and are negative.   Physical Exam Constitutional:      Appearance: Normal appearance.  Neurological:     Mental Status: He is alert.  Psychiatric:        Mood and Affect: Mood  normal.        Behavior: Behavior normal.        Thought Content: Thought content normal.        Judgment: Judgment normal.    Freestyle Libre 3 CGM Download:  % Time CGM is active: 96% Average Glucose: 272 mg/dL Glucose Management Indicator: 9.8%  Glucose Variability: 13.5% (goal <36%) Time in Goal:  - Time in range 70-180: 1% - Time above range: 99% (26% High 181-250 mg/dL, 03% Very High >250mg /dL) - Time below range: 0% Observed patterns: Average is consistently above 250 mg/dL   Lab Results  Component Value Date   HGBA1C 10.2 (A) 08/30/2022   Vitals:   10/15/22 0926  BP: 125/63  Pulse: 63  SpO2: 100%    Lipid Panel     Component Value Date/Time   CHOL 99 (L) 08/24/2022 1056   TRIG 122 08/24/2022 1056   HDL 48 08/24/2022 1056   CHOLHDL 2.1 08/24/2022 1056   CHOLHDL 2.5 10/26/2014 1118   VLDL 20 10/26/2014 1118   LDLCALC 29 08/24/2022 1056   LDLDIRECT 72 06/04/2012 0948    Clinical Atherosclerotic Cardiovascular Disease (ASCVD): Yes The ASCVD Risk score (Arnett DK, et al., 2019) failed to calculate for the following reasons:   The patient has a prior MI or stroke diagnosis   A/P: Diabetes diagnosed in 2008. Patient is able to verbalize appropriate hypoglycemia management plan. Medication adherence appears good. Patient is losing weight from Ozempic (semaglutide) use. Control is suboptimal due to elevated readings consistently >250 mg/dL from CGM.  -Increased dose of GLP-1 Ozempic (  semaglutide) from 0.25 mg + 10 clicks to 0.25 mg + 20 clicks weekly. Patient educated on potential adverse effects from titration (nausea). -Continued SGLT2-I Farxiga (dapagliflozin) 10 mg. Counseled on sick day rules. -Continued metformin XR 1000 mg BID.  -Discontinued DPP-4 Inhibitor Januvia (sitagliptin) 100 mg daily -Extensively discussed pathophysiology of diabetes, recommended lifestyle interventions, dietary effects on blood sugar control.  -Counseled on s/sx of and management  of hypoglycemia.  -Next A1c anticipated 11/28/22.   Hypertension . Blood pressure goal of <130/80 mmHg. Medication adherence good. Blood pressure control is optimal due to in-office readings under goal. -Continued enalapril 20 mg BID -Continued amlodipine 10 mg daily -Continued clonidine 0.1 mg BID   Written patient instructions provided. Patient verbalized understanding of treatment plan.  Total time in face to face counseling 25 minutes.    Follow-up:  Pharmacist 11/05/22. Patient seen with Revonda Standard, PharmD Candidate.

## 2022-10-15 NOTE — Assessment & Plan Note (Signed)
Diabetes diagnosed in 2008. Patient is able to verbalize appropriate hypoglycemia management plan. Medication adherence appears good. Patient is losing weight from Ozempic (semaglutide) use. Control is suboptimal due to elevated readings consistently >250 mg/dL from CGM.  -Increased dose of GLP-1 Ozempic (semaglutide) from 0.25 mg + 10 clicks to 0.25 mg + 20 clicks weekly. Patient educated on potential adverse effects from titration (nausea). -Continued SGLT2-I Farxiga (dapagliflozin) 10 mg. Counseled on sick day rules. -Continued metformin XR 1000 mg BID.  -Discontinued DPP-4 Inhibitor Januvia (sitagliptin) 100 mg daily -Extensively discussed pathophysiology of diabetes, recommended lifestyle interventions, dietary effects on blood sugar control.

## 2022-10-15 NOTE — Progress Notes (Signed)
Reviewed and agree with Dr Koval's plan.   

## 2022-10-17 ENCOUNTER — Telehealth: Payer: Self-pay | Admitting: Family Medicine

## 2022-10-17 NOTE — Telephone Encounter (Signed)
Patient dropped off verification of chronic condition form to be completed. Last DOS was 08/24/22. Placed in [employer].

## 2022-10-23 ENCOUNTER — Telehealth: Payer: Self-pay | Admitting: Podiatry

## 2022-10-23 MED ORDER — TRAMADOL HCL 50 MG PO TABS: 50.0000 mg | ORAL_TABLET | Freq: Three times a day (TID) | ORAL | 0 refills | Status: AC | PRN

## 2022-10-23 NOTE — Telephone Encounter (Signed)
Pt called asking if you could call him in some tramadol to [facility] on [address] in [place] for his left great toe the gout has flared up again.

## 2022-10-31 NOTE — Telephone Encounter (Signed)
Placed a form in your box that the pt needs completed.  Thank you!

## 2022-11-01 NOTE — Telephone Encounter (Signed)
Received completed form in RN box. Faxed to provided number at [phone].  Veronda Prude, RN

## 2022-11-05 ENCOUNTER — Ambulatory Visit (INDEPENDENT_AMBULATORY_CARE_PROVIDER_SITE_OTHER): Payer: Medicare HMO | Admitting: Pharmacist

## 2022-11-05 ENCOUNTER — Encounter: Payer: Self-pay | Admitting: Pharmacist

## 2022-11-05 VITALS — BP 127/62 | HR 77 | Wt 178.6 lb

## 2022-11-05 DIAGNOSIS — E119 Type 2 diabetes mellitus without complications: Secondary | ICD-10-CM

## 2022-11-05 DIAGNOSIS — I10 Essential (primary) hypertension: Secondary | ICD-10-CM | POA: Diagnosis not present

## 2022-11-05 MED ORDER — LOSARTAN POTASSIUM 100 MG PO TABS: 100.0000 mg | ORAL_TABLET | Freq: Every day | ORAL | 3 refills | Status: DC

## 2022-11-05 MED ORDER — INSULIN GLARGINE 100 UNIT/ML SOLOSTAR PEN: 10.0000 [IU] | PEN_INJECTOR | Freq: Every day | SUBCUTANEOUS | 11 refills | Status: DC

## 2022-11-05 NOTE — Patient Instructions (Addendum)
It was great seeing you today!  Here are the changes we are making to your medications today:  START Lantus (insulin glargine) 10 units daily before breakfast  DISCONTINUE enalapril  START Losartan 100 mg daily

## 2022-11-05 NOTE — Assessment & Plan Note (Signed)
Diabetes longstanding currently uncontrolled. Patient is able to verbalize appropriate hypoglycemia management plan. Medication adherence appears good. Control is suboptimal due to elevated glucose and GMI values from CGM report that are above goal. -Started basal insulin Lantus (insulin glargine) 10 units daily before breakfast. -Continued GLP-1 Ozempic (semaglutide) 0.25 mg + 20 clicks weekly -Continued SGLT2-I Farxiga (dapagliflozin) 10 mg. Counseled on sick day rules. -Continued metformin-XR 1000 mg BID.  -Patient educated on purpose, proper use, and potential adverse effects of Lantus (low blood sugar).  -Extensively discussed pathophysiology of diabetes, recommended lifestyle interventions, dietary effects on blood sugar control.  -Counseled on s/sx of and management of hypoglycemia.  -Next A1c anticipated 11/28/22.

## 2022-11-05 NOTE — Progress Notes (Signed)
Reviewed and agree with Dr Koval's plan.   

## 2022-11-05 NOTE — Progress Notes (Signed)
S:     Chief Complaint  Patient presents with   Medication Management    T2DM management   67 y.o. male who presents for diabetes evaluation, education, and management.  PMH is significant for T2DM, Gout.  Patient was referred and last seen by Primary Care Provider, Dr. Idalia Needle, on 08/24/22.  At last visit, patient's Ozempic (semaglutide) was increased to 0.25 mg + 20 clicks and DPP-4 inhibitor Januvia (sitagliptin) was discontinued.   Today, patient arrives in good spirits and presents without any assistance.  Patient reports Diabetes was diagnosed about 15-20 years ago.   Current diabetes medications include: Ozempic (semaglutide) 0.25 mg + 20 clicks (has been on this dose since last Sunday), metformin 1000 mg BID, Farxiga (dapagliflozin) 10 mg daily Current hypertension medications include: Amlodipine 10 mg daily, Enalapril 40 mg daily, clonidine 0.1 mg BID Current hyperlipidemia medications include: Rosuvastatin 20 mg daily  Patient reports adherence to taking all medications as prescribed.   Do you feel that your medications are working for you? yes Have you been experiencing any side effects to the medications prescribed? no Do you have any problems obtaining medications due to transportation or finances? no Insurance coverage: [employer]are  Patient denies hypoglycemic events.  Patient denies nocturia (nighttime urination).  Patient denies neuropathy (nerve pain). Patient denies visual changes. Patient denies self foot exams.   Patient reported dietary habits:  Breakfast: Boiled eggs and 1 piece of bacon Drinks: half a glass of apple juice  Within the past 12 months, did you worry whether your food would run out before you got money to buy more? no Within the past 12 months, did the food you bought run out, and you didn't have money to get more? no  O:   Review of Systems  All other systems reviewed and are negative.   Physical Exam Constitutional:       Appearance: Normal appearance.  Pulmonary:     Effort: Pulmonary effort is normal.     Breath sounds: Normal breath sounds.  Neurological:     Mental Status: He is alert.  Psychiatric:        Mood and Affect: Mood normal.        Behavior: Behavior normal.        Thought Content: Thought content normal.        Judgment: Judgment normal.      Freestyle Libre 3 CGM Download:  % Time CGM is active: 96% Average Glucose: 269 mg/dL Glucose Management Indicator: 9.7%  Glucose Variability: 13.3% (goal <36%) Time in Goal:  - Time in range 70-180: 0% - Time above range: 100% (29% High 181-250 mg/dL, 34% Very High >742 mg/dL) - Time below range: 0% Observed patterns: Low variability but glucose is consistently high. Insulin addition will help bring down entire line evenly   Lab Results  Component Value Date   HGBA1C 10.2 (A) 08/30/2022   Vitals:   11/05/22 0914  BP: 127/62  Pulse: 77  SpO2: 99%    Lipid Panel     Component Value Date/Time   CHOL 99 (L) 08/24/2022 1056   TRIG 122 08/24/2022 1056   HDL 48 08/24/2022 1056   CHOLHDL 2.1 08/24/2022 1056   CHOLHDL 2.5 10/26/2014 1118   VLDL 20 10/26/2014 1118   LDLCALC 29 08/24/2022 1056   LDLDIRECT 72 06/04/2012 0948    Clinical Atherosclerotic Cardiovascular Disease (ASCVD): Yes  The ASCVD Risk score (Arnett DK, et al., 2019) failed to calculate for the following  reasons:   The patient has a prior MI or stroke diagnosis   A/P: Diabetes longstanding currently uncontrolled. Patient is able to verbalize appropriate hypoglycemia management plan. Medication adherence appears good. Control is suboptimal due to elevated glucose and GMI values from CGM report that are above goal. -Started basal insulin Lantus (insulin glargine) 10 units daily before breakfast. -Continued GLP-1 Ozempic (semaglutide) 0.25 mg + 20 clicks weekly -Continued SGLT2-I Farxiga (dapagliflozin) 10 mg. Counseled on sick day rules. -Continued metformin-XR 1000  mg BID.  -Patient educated on purpose, proper use, and potential adverse effects of Lantus (low blood sugar).  -Extensively discussed pathophysiology of diabetes, recommended lifestyle interventions, dietary effects on blood sugar control.  -Counseled on s/sx of and management of hypoglycemia.  Plan to call patient in two weeks for diabetes control review and potential medication adjustment -Next A1c anticipated 11/28/22.   Hypertension longstanding currently controlled. Blood pressure goal of <130/80 mmHg. Medication adherence good. Blood pressure control is optimal due to in-office BP under goal. Patient has history of gout and flare ups that would benefit from uric acid lowering property of losartan treatment. -Discontinued enalapril 40 mg daily -Started losartan 100 mg daily -Continued amlodipine 10 mg daily -Continued clonidine 0.1 mg BID  Written patient instructions provided. Patient verbalized understanding of treatment plan.  Total time in face to face counseling 30 minutes.    Follow-up:  Pharmacist 11/26/22. Patient seen with Jerry Caras, PharmD PGY-1 Pharmacy Resident and Revonda Standard, PharmD Candidate.

## 2022-11-05 NOTE — Assessment & Plan Note (Signed)
Hypertension longstanding currently controlled. Blood pressure goal of <130/80 mmHg. Medication adherence good. Blood pressure control is optimal due to in-office BP under goal. Patient has history of gout and flare ups that would benefit from uric acid lowering property of losartan treatment. -Discontinued enalapril 40 mg daily -Started losartan 100 mg daily -Continued amlodipine 10 mg daily -Continued clonidine 0.1 mg BID

## 2022-11-06 ENCOUNTER — Telehealth: Payer: Self-pay

## 2022-11-06 DIAGNOSIS — H401131 Primary open-angle glaucoma, bilateral, mild stage: Secondary | ICD-10-CM | POA: Diagnosis not present

## 2022-11-06 NOTE — Telephone Encounter (Signed)
Left voicemail informing patient his novo nordisk shipment is ready for pickup.  4 boxes of ozempic 1mg  dose pens are labeled and ready in med room fridge.

## 2022-11-12 ENCOUNTER — Telehealth: Payer: Self-pay

## 2022-11-12 NOTE — Telephone Encounter (Signed)
Patient calls nurse line in regards [employer].   He reports he is down to his last box. He reports he gets these from Dr. Raymondo Band.   I do see he has an upcoming apt with him on 5/20.  Will forward to [place].

## 2022-11-13 ENCOUNTER — Telehealth: Payer: Self-pay

## 2022-11-13 NOTE — Telephone Encounter (Signed)
Left message regarding az&me shipment being ready for pickup   3 bottles of Farxiga 10mg  are labeled and ready in med room.

## 2022-11-14 ENCOUNTER — Telehealth: Payer: Self-pay | Admitting: Pharmacist

## 2022-11-14 DIAGNOSIS — E119 Type 2 diabetes mellitus without complications: Secondary | ICD-10-CM

## 2022-11-14 MED ORDER — INSULIN GLARGINE 100 UNIT/ML SOLOSTAR PEN: 16.0000 [IU] | PEN_INJECTOR | Freq: Every day | SUBCUTANEOUS | 11 refills | Status: DC

## 2022-11-14 NOTE — Telephone Encounter (Signed)
Contacted patient RE glucose control since starting insulin.   Slight improvement in control but majority of readings remain > 200  Patient verbalized concern for low readings including any readings less than 150mg /dl.   After conversation we agreed to make slight dose increase in lantus.  Patient verbalized understanding of need to increase insulin from 10 to 16 units once daily - starting tomorrow.   Follow-up in Rx clinic 5/20

## 2022-11-19 NOTE — Telephone Encounter (Signed)
Reviewed and agree with Dr Koval's plan.   

## 2022-11-23 ENCOUNTER — Ambulatory Visit (INDEPENDENT_AMBULATORY_CARE_PROVIDER_SITE_OTHER): Payer: Medicare HMO | Admitting: Family Medicine

## 2022-11-23 ENCOUNTER — Other Ambulatory Visit: Payer: Self-pay

## 2022-11-23 ENCOUNTER — Encounter: Payer: Self-pay | Admitting: Family Medicine

## 2022-11-23 VITALS — BP 153/73 | HR 66 | Ht 73.0 in | Wt 200.0 lb

## 2022-11-23 DIAGNOSIS — E119 Type 2 diabetes mellitus without complications: Secondary | ICD-10-CM

## 2022-11-23 DIAGNOSIS — I10 Essential (primary) hypertension: Secondary | ICD-10-CM

## 2022-11-23 LAB — POCT GLYCOSYLATED HEMOGLOBIN (HGB A1C): HbA1c, POC (controlled diabetic range): 9.7 % — AB (ref 0.0–7.0)

## 2022-11-23 MED ORDER — DAPAGLIFLOZIN PROPANEDIOL 10 MG PO TABS: 10.0000 mg | ORAL_TABLET | Freq: Every day | ORAL | 3 refills | Status: DC

## 2022-11-23 MED ORDER — CLONIDINE HCL 0.1 MG PO TABS: 0.1000 mg | ORAL_TABLET | Freq: Two times a day (BID) | ORAL | 3 refills | Status: DC

## 2022-11-23 MED ORDER — ROSUVASTATIN CALCIUM 20 MG PO TABS: 20.0000 mg | ORAL_TABLET | Freq: Every day | ORAL | 3 refills | Status: DC

## 2022-11-23 MED ORDER — AMLODIPINE BESYLATE 10 MG PO TABS: 10.0000 mg | ORAL_TABLET | Freq: Every day | ORAL | 2 refills | Status: DC

## 2022-11-23 MED ORDER — ALLOPURINOL 100 MG PO TABS: 200.0000 mg | ORAL_TABLET | Freq: Every day | ORAL | 3 refills | Status: DC

## 2022-11-23 MED ORDER — SEMAGLUTIDE (1 MG/DOSE) 4 MG/3ML ~~LOC~~ SOPN: 1.0000 mg | PEN_INJECTOR | SUBCUTANEOUS | 0 refills | Status: DC

## 2022-11-23 MED ORDER — LEVOTHYROXINE SODIUM 75 MCG PO TABS: 75.0000 ug | ORAL_TABLET | Freq: Every day | ORAL | 0 refills | Status: DC

## 2022-11-23 MED ORDER — GABAPENTIN 100 MG PO CAPS: 100.0000 mg | ORAL_CAPSULE | Freq: Every day | ORAL | 1 refills | Status: DC

## 2022-11-23 MED ORDER — METFORMIN HCL ER 500 MG PO TB24: 1000.0000 mg | ORAL_TABLET | Freq: Two times a day (BID) | ORAL | 2 refills | Status: DC

## 2022-11-23 NOTE — Progress Notes (Unsigned)
    SUBJECTIVE:   CHIEF COMPLAINT / HPI:   Patient presents for medication refills- would like all his meds sent to [facility] pharmacy   Diabetes- following with Dr. Raymondo Band has appointment next week Has continuous blood glucose monitor. States fasting sugars have been in the 200s Currently on Lanutus 30units, Metformin 1000mg  BID, Farxiga 10mg , Ozempic 1mg   HTN: BP elevated. Took medication today. Amlodipine 10mg , Losartan 100mg , Enalapril, 40mg , Clonidine .01mg  BID   Has been taking Meloxicam daily for arthritis, discussed using in moderation and using Tylenol first   PERTINENT  PMH / PSH: Reviewed   OBJECTIVE:   BP (!) 153/73   Pulse 66   Ht 6\' 1"  (1.854 m)   Wt 200 lb (90.7 kg)   SpO2 100%   BMI 26.39 kg/m    General: alert, pleasant, NAD CV: RRR no murmurs Resp: CTAB normal WOB GI: soft, non distended   ASSESSMENT/PLAN:   Type 2 diabetes mellitus without complications (HCC) A1c 9.7. Currently on Lanutus 30units, Metformin 1000mg  BID, Farxiga 10mg , Ozempic 1mg . Following with Dr. Raymondo Band has appointment next week. Will continue current regimen for now. Can increase Ozempic to 2mg  at next refill. Next A1c check in 3 months   Essential hypertension BP 153/73. States he has been taking his medication. Current on Amlodipine 10mg , Losartan 100mg , Enalapril, 40mg , Clonidine .01mg  BID. Will continue to monitor and at follow up can consider switching to combination pill.   Hypothyroidism  Refilled Synthroid but will need to check TSH at follow up. Overdue for repeat labs   Refilled medications per patient's request to [facility]   Cora Collum, DO [facility]

## 2022-11-23 NOTE — Patient Instructions (Signed)
It was great seeing you today!  I have refilled your medications!  We will check your A1c and will discuss at your pharmacy appointment next week.   Be sure to follow up with Dr. Macky Lower team next week and they can check on your diabetes and blood pressure as well.   Feel free to call with any questions or concerns at any time, at [phone].   Take care,  Dr. Cora Collum [facility]

## 2022-11-24 NOTE — Assessment & Plan Note (Signed)
A1c 9.7. Currently on Lanutus 30units, Metformin 1000mg  BID, Farxiga 10mg , Ozempic 1mg . Following with Dr. Raymondo Band has appointment next week. Will continue current regimen for now. Can increase Ozempic to 2mg  at next refill. Next A1c check in 3 months

## 2022-11-24 NOTE — Assessment & Plan Note (Signed)
BP 153/73. States he has been taking his medication. Current on Amlodipine 10mg , Losartan 100mg , Enalapril, 40mg , Clonidine .01mg  BID. Will continue to monitor and at follow up can consider switching to combination pill.

## 2022-11-26 ENCOUNTER — Ambulatory Visit (INDEPENDENT_AMBULATORY_CARE_PROVIDER_SITE_OTHER): Payer: Medicare HMO | Admitting: Pharmacist

## 2022-11-26 ENCOUNTER — Encounter: Payer: Self-pay | Admitting: Pharmacist

## 2022-11-26 DIAGNOSIS — E119 Type 2 diabetes mellitus without complications: Secondary | ICD-10-CM

## 2022-11-26 MED ORDER — INSULIN GLARGINE 100 UNIT/ML SOLOSTAR PEN
40.0000 [IU] | PEN_INJECTOR | Freq: Every day | SUBCUTANEOUS | 11 refills | Status: DC
Start: 2022-11-26 — End: 2023-03-04

## 2022-11-26 MED ORDER — SEMAGLUTIDE(0.25 OR 0.5MG/DOS) 2 MG/3ML ~~LOC~~ SOPN: 0.5000 mg | PEN_INJECTOR | SUBCUTANEOUS | 1 refills | Status: DC

## 2022-11-26 NOTE — Assessment & Plan Note (Signed)
Diabetes longstanding currently uncontrolled with most recent A1c 9.7%. Improved control (GMI 8.8%) over the last 2 weeks with start of basal insulin. Patient is able to verbalize appropriate hypoglycemia management plan. Medication adherence appears good. Control is suboptimal due to slow insulin and GLP-1 titration. -Increased dose of basal insulin Lantus (insulin glargine) from 30 units to 40 units daily in the morning. -Increased dose of GLP-1 Ozempic (semaglutide) from 0.25 mg + 20 clicks to 0.5 mg .  -Provided next 3 months of sensors for LIBERATE study. -Continued SGLT2-I Farxiga (dapagliflozin) 10 mg. Counseled on sick day rules.  Medication supply from manufacturer provided.  -Continued metformin 1000 mg BID.  -Patient educated on purpose, proper use, and potential adverse effects of medications.

## 2022-11-26 NOTE — Patient Instructions (Addendum)
It was nice to see you today!  Your goal blood sugar is 80-130 before eating and less than 180 after eating.  Medication Changes: Continue all other medications.  Increase insulin glargine (Lantus) to 40 units daily starting TOMORROW Increase semaglutide (Ozempic) to 0.5 mg weekly starting SUNDAY  Monitor blood sugars at home and keep a log (glucometer or piece of paper) to bring with you to your next visit.  Keep up the good work with diet and exercise. Aim for a diet full of vegetables, fruit and lean meats (chicken, [place], fish). Try to limit salt intake by eating fresh or frozen vegetables (instead of canned), rinse canned vegetables prior to cooking and do not add any additional salt to meals.

## 2022-11-26 NOTE — Progress Notes (Signed)
Reviewed and agree with Dr Koval's plan.   

## 2022-11-26 NOTE — Progress Notes (Signed)
S:     Chief Complaint  Patient presents with   Medication Management    Dm follow up   67 y.o. male who presents for diabetes evaluation, education, and management. LIBERATE study 3 mo follow up. PMH is significant for T2DM, gout. Patient was last seen by Primary Care Provider, Dr. Idalia Needle, on 11/23/2022. Last seen by pharmacy on 11/05/2022, where insulin glargine (Lantus) was started at 10 units. Since last office visit, Lantus has been increased to 30 units daily.  Today, patient arrives in good spirits and presents without any assistance.  Diabetes was diagnosed in 2008 per problem list.  Current diabetes medications include: Lantus (insulin glargine) 30 units daily, semaglutide (Ozempic) 0.25 mg + 20 clicks weekly on Sundays, Farxiga (dapagliflozin) 10 mg daily. Metformin XR 1000 mg BID. Current hypertension medications include: losartan 100 mg daily, amlodipine 10 mg daily, clonidine 0.1 mg BID Current hyperlipidemia medications include: rosuvastatin 20 mg daily  Patient reports adherence to taking all medications as prescribed.   Do you feel that your medications are working for you? yes Have you been experiencing any side effects to the medications prescribed? no Do you have any problems obtaining medications due to transportation or finances? no Insurance coverage: [employer]  Patient denies hypoglycemic events.  Patient denies nocturia (nighttime urination) and improved sleep. Patient denies neuropathy (nerve pain).  O:   Review of Systems  All other systems reviewed and are negative.   Physical Exam Constitutional:      Appearance: Normal appearance.  Pulmonary:     Effort: Pulmonary effort is normal.  Neurological:     Mental Status: He is alert.  Psychiatric:        Mood and Affect: Mood normal.        Behavior: Behavior normal.        Thought Content: Thought content normal.    5/202/2024 CGM Download:  % Time CGM is active: 96% Average Glucose: 231  mg/dL Glucose Management Indicator: 8.8%  Glucose Variability: 18.2% (goal <36%) Time in Goal:  - Time in range 70-180: 11% - Time above range: 89% - 29% >250 mg/dL - Time below range: 0% Observed patterns: Overall low variability still above target range.   Lab Results  Component Value Date   HGBA1C 9.7 (A) 11/23/2022   Vitals:   11/26/22 0939  BP: 130/64  Pulse: (!) 58  SpO2: 100%    Lipid Panel     Component Value Date/Time   CHOL 99 (L) 08/24/2022 1056   TRIG 122 08/24/2022 1056   HDL 48 08/24/2022 1056   CHOLHDL 2.1 08/24/2022 1056   CHOLHDL 2.5 10/26/2014 1118   VLDL 20 10/26/2014 1118   LDLCALC 29 08/24/2022 1056   LDLDIRECT 72 06/04/2012 0948    Clinical Atherosclerotic Cardiovascular Disease (ASCVD): Yes  The ASCVD Risk score (Arnett DK, et al., 2019) failed to calculate for the following reasons:   The patient has a prior MI or stroke diagnosis    A/P: Diabetes longstanding currently uncontrolled with most recent A1c 9.7%. Improved control (GMI 8.8%) over the last 2 weeks with start of basal insulin. Patient is able to verbalize appropriate hypoglycemia management plan. Medication adherence appears good. Control is suboptimal due to slow insulin and GLP-1 titration. -Increased dose of basal insulin Lantus (insulin glargine) from 30 units to 40 units daily in the morning. -Increased dose of GLP-1 Ozempic (semaglutide) from 0.25 mg + 20 clicks to 0.5 mg .  -Provided next 3 months of sensors  for LIBERATE study. -Continued SGLT2-I Farxiga (dapagliflozin) 10 mg. Counseled on sick day rules.  Medication supply from manufacturer provided.  -Continued metformin 1000 mg BID.  -Patient educated on purpose, proper use, and potential adverse effects of medications.  -Extensively discussed pathophysiology of diabetes, recommended lifestyle interventions, dietary effects on blood sugar control.  DDS completed - total score: 17 - 3 month supply of Libre 3 sensors supllied  (8 sensors total).  -Counseled on s/sx of and management of hypoglycemia.  -Next A1c anticipated August 2024.  -UACR due at next visit.  ASCVD risk - secondary prevention in patient with diabetes. Last LDL is 29 at goal of <55 mg/dL. -Continued Rosuvastatin 20 mg.   Hypertension longstanding currently slightly above goal 130/64. Systolic blood pressure goal of <130 mmHg. Medication adherence good. Tolerating losartan well since switch from ACE inhibitor. -Continued all blood pressure medications.  Written patient instructions provided. Patient verbalized understanding of treatment plan.  Total time in face to face counseling 34 minutes.    Follow-up:  Pharmacist 11/04/2022 Patient seen with Haze Boyden PharmD Candidate.

## 2022-12-07 ENCOUNTER — Other Ambulatory Visit: Payer: Self-pay | Admitting: Podiatry

## 2022-12-07 MED ORDER — TRAMADOL HCL 50 MG PO TABS: 50.0000 mg | ORAL_TABLET | Freq: Three times a day (TID) | ORAL | 0 refills | Status: AC | PRN

## 2022-12-07 NOTE — Progress Notes (Signed)
Received request from [facility] to send patient's tramadol Rx to them.  Pharmacy was updated in the system and Rx sent on Dr. Eliane Decree behalf today.

## 2022-12-27 ENCOUNTER — Ambulatory Visit (INDEPENDENT_AMBULATORY_CARE_PROVIDER_SITE_OTHER): Payer: Medicare HMO | Admitting: Pharmacist

## 2022-12-27 ENCOUNTER — Encounter: Payer: Self-pay | Admitting: Pharmacist

## 2022-12-27 VITALS — BP 127/66 | HR 70 | Wt 209.2 lb

## 2022-12-27 DIAGNOSIS — E119 Type 2 diabetes mellitus without complications: Secondary | ICD-10-CM | POA: Diagnosis not present

## 2022-12-27 DIAGNOSIS — N529 Male erectile dysfunction, unspecified: Secondary | ICD-10-CM

## 2022-12-27 MED ORDER — SEMAGLUTIDE (1 MG/DOSE) 4 MG/3ML ~~LOC~~ SOPN
1.0000 mg | PEN_INJECTOR | SUBCUTANEOUS | 3 refills | Status: DC
Start: 2022-12-27 — End: 2023-05-17

## 2022-12-27 MED ORDER — TADALAFIL 20 MG PO TABS
10.0000 mg | ORAL_TABLET | ORAL | 0 refills | Status: DC | PRN
Start: 2022-12-27 — End: 2023-02-11

## 2022-12-27 NOTE — Progress Notes (Signed)
S:     Chief Complaint  Patient presents with   Medication Management    Diabetes Management   67 y.o. male who presents for diabetes evaluation, education, and management.  PMH is significant for T2DM, HLD, CVA, and gout.  Patient was last seen by Primary Care Provider, Dr. Idalia Needle, on 11/23/2022.   At last visit, his Lantus was increased to 40 units daily and Ozempic was increased to 0.5 mg weekly.   Today, patient arrives in good spirits and presents without any assistance. He appears to be especially concerned with hypoglycemia.    Diabetes was diagnosed in 2008  Current diabetes medications include: Lantus (insulin glargine) 40 units daily (taking 30 units), semaglutide (Ozempic) 0.5 mg weekly on Sundays, Farxiga (dapagliflozin) 10 mg daily. Metformin XR 1000 mg BID. Current hypertension medications include: losartan 100 mg daily, amlodipine 10 mg daily, clonidine 0.1 mg BID Current hyperlipidemia medications include: rosuvastatin 20 mg daily  Patient reports skipping his Lantus dose on Ozempic injection days. He also states that he did not increase his Lantus from 40 to 30 units as directed at last visit. Patient reports adherence to taking all other medications as prescribed.   Insurance coverage: [facility]  Patient reports a single hypoglycemic event, however this is not seen on his LibreView report.   Patient denies nocturia (nighttime urination).  Patient denies neuropathy (nerve pain). Patient denies visual changes. Patient reports self foot exams.    O:   Review of Systems  All other systems reviewed and are negative.   Physical Exam Vitals reviewed.  Constitutional:      Appearance: Normal appearance.  Pulmonary:     Effort: Pulmonary effort is normal.     Breath sounds: Normal breath sounds.  Neurological:     Mental Status: He is alert.  Psychiatric:        Mood and Affect: Mood normal.        Behavior: Behavior normal.        Thought  Content: Thought content normal.        Judgment: Judgment normal.    CGM Download:  % Time CGM is active: 96% Average Glucose: 225 mg/dL Glucose Management Indicator: 8.7  Glucose Variability: 14.9 (goal <36%) Time in Goal:  - Time in range 70-180: 8% - Time above range: 92% - Time below range: 0% Observed patterns: consistent hyperglycemia throughout the day   Lab Results  Component Value Date   HGBA1C 9.7 (A) 11/23/2022   Vitals:   12/27/22 0923  BP: 127/66  Pulse: 70  SpO2: 100%    Lipid Panel     Component Value Date/Time   CHOL 99 (L) 08/24/2022 1056   TRIG 122 08/24/2022 1056   HDL 48 08/24/2022 1056   CHOLHDL 2.1 08/24/2022 1056   CHOLHDL 2.5 10/26/2014 1118   VLDL 20 10/26/2014 1118   LDLCALC 29 08/24/2022 1056   LDLDIRECT 72 06/04/2012 0948    Clinical Atherosclerotic Cardiovascular Disease (ASCVD): Yes  The ASCVD Risk score (Arnett DK, et al., 2019) failed to calculate for the following reasons:   The patient has a prior MI or stroke diagnosis    A/P: Diabetes longstanding currently uncontrolled. Patient is able to verbalize appropriate hypoglycemia management plan. Medication adherence appears suboptimal. Control is suboptimal due to not taking medications as instructed. -Increased dose of basal insulin Lantus (insulin glargine) to 40 units, as instructed at previous visit. Thoroughly educated on the importance of daily adherence and hypoglycemia. -Increased dose of  GLP-1  Ozempic (semaglutide) to 1 mg weekly with instructions to subtract 15 clicks for the first 2 weeks then subtract 10 clicks thereafter (MAP supply provided - 4 pens) -Continued SGLT2-I Farxiga (dapagliflozin) 10 mg daily -Continued metformin 1000 mg BID.  -Patient educated on purpose, proper use, and potential adverse effects of medications.  -Extensively discussed pathophysiology of diabetes, recommended lifestyle interventions, dietary effects on blood sugar control.  -Counseled on  s/sx of and management of hypoglycemia.  -Next A1c anticipated 02/2023.   ASCVD risk - secondary prevention in patient with diabetes. Last LDL is 29 at goal of <55 mg/dL. -Continued rosuvastatin 20 mg.    Hypertension longstanding currently at goal of <130 mmHg. Medication adherence good.  -Continued all blood pressure medications.  Medication refill - requested refill of his tadalafil today. - Refill sent to pharmacy  Written patient instructions provided. Patient verbalized understanding of treatment plan.  Total time in face to face counseling 36 minutes.    Follow-up:  Pharmacist 01/31/23 Patient seen with Lily Peer, PharmD, PGY-1 resident.

## 2022-12-27 NOTE — Patient Instructions (Addendum)
It was nice to see you today!  Your goal blood sugar is 80-130 before eating and less than 180 after eating.  Medication Changes: Continue metformin 1000 mg (2 tablets) twice daily and Farxiga 10 mg daily  Adjust Lantus to 40 units daily  Increase Ozempic to 1 mg once weekly. Start at 15 clicks below 1 mg for the first two weeks. Then increase to 10 clicks below 1 mg thereafter  Monitor blood sugars at home and keep a log (glucometer or piece of paper) to bring with you to your next visit.  Keep up the good work with diet and exercise. Aim for a diet full of vegetables, fruit and lean meats (chicken, [place], fish). Try to limit salt intake by eating fresh or frozen vegetables (instead of canned), rinse canned vegetables prior to cooking and do not add any additional salt to meals.

## 2022-12-27 NOTE — Assessment & Plan Note (Signed)
Diabetes longstanding currently uncontrolled. Patient is able to verbalize appropriate hypoglycemia management plan. Medication adherence appears suboptimal. Control is suboptimal due to not taking medications as instructed. -Increased dose of basal insulin Lantus (insulin glargine) to 40 units, as instructed at previous visit. Thoroughly educated on the importance of daily adherence and hypoglycemia. -Increased dose of GLP-1  Ozempic (semaglutide) to 1 mg weekly with instructions to subtract 15 clicks for the first 2 weeks then subtract 10 clicks thereafter (MAP supply provided - 4 pens) -Continued SGLT2-I Farxiga (dapagliflozin) 10 mg daily -Continued metformin 1000 mg BID.  -Patient educated on purpose, proper use, and potential adverse effects of medications.

## 2022-12-28 NOTE — Progress Notes (Signed)
Reviewed and agree with Dr Koval's plan.   

## 2023-01-03 ENCOUNTER — Ambulatory Visit (INDEPENDENT_AMBULATORY_CARE_PROVIDER_SITE_OTHER): Payer: Medicare HMO | Admitting: Family Medicine

## 2023-01-03 ENCOUNTER — Encounter: Payer: Self-pay | Admitting: Family Medicine

## 2023-01-03 ENCOUNTER — Other Ambulatory Visit: Payer: Self-pay

## 2023-01-03 VITALS — BP 123/79 | HR 75 | Ht 71.0 in | Wt 211.2 lb

## 2023-01-03 DIAGNOSIS — R195 Other fecal abnormalities: Secondary | ICD-10-CM

## 2023-01-03 DIAGNOSIS — E039 Hypothyroidism, unspecified: Secondary | ICD-10-CM

## 2023-01-03 DIAGNOSIS — M5440 Lumbago with sciatica, unspecified side: Secondary | ICD-10-CM | POA: Diagnosis not present

## 2023-01-03 DIAGNOSIS — M5430 Sciatica, unspecified side: Secondary | ICD-10-CM

## 2023-01-03 MED ORDER — GABAPENTIN 100 MG PO CAPS
100.0000 mg | ORAL_CAPSULE | Freq: Three times a day (TID) | ORAL | 1 refills | Status: DC | PRN
Start: 2023-01-03 — End: 2023-04-23

## 2023-01-03 NOTE — Patient Instructions (Signed)
Good to see you today - Thank you for coming in  Things we discussed today:  1) For your Sciatica pain,  - Take Tylenol 1000mg  every 6 hours as needed for pain - Take Gabapentin 100mg  every 8 hours as needed for pain - Apply a Lidocaine patch every 24 hours to your lower back - Continue to do the exercises and stretches that you learned from Physical Therapy  2) For the vomiting and diarrhea - We'll check your blood level today - Stop taking the Vitamin that might be making your stools turn dark  Come back to see me if: - your stools continue to stay dark for the next week - you start to see blood in your stool or vomit - you start to have stomach pain or heartburn

## 2023-01-03 NOTE — Progress Notes (Signed)
    SUBJECTIVE:   CHIEF COMPLAINT / HPI:   SL is a 67yo M w/ hx of sciatic back pain, T2DM, HTN, HLD p/f back pain management.  Sciatic Back Pain - Pt is requesting further pain management for chronic sciatica. This is chronic, left-sided lower back pain that radiates down her L leg. - Was taking Meloxicam, but stopped due to concern for long-term NSAID use. - Has tried Tylenol, but it wears off quickly (after 1-2hrs) and pt is worried about taking too much. - Worked with PT. Stretching exercises help to decrease the pain, but does not completely alleviate. - Pain is daily. Not associated with any particular time of day.    Dark Vomit and Stool - Saturday night, started vomiting. Vomit was black and dark, no blood. Vomit looked like "flecks of skin". Does not think it looked like coffee grounds. This happened from ~6pm to 3am, then stopped. But then pt started to have diarrhea. - Diarrhea was dark and black. Denies any pain or heartburn sensation. Stool is now formed, but still dark.  - Pt reports he started to take a dark supplement that is similar colored ~2wks ago. Takes 1 for memory and another that is a bigger pill that is a multivitamin. Unsure if it contains iron.   OBJECTIVE:   BP 123/79   Pulse 75   Ht 5\' 11"  (1.803 m)   Wt 211 lb 3.2 oz (95.8 kg)   SpO2 100%   BMI 29.46 kg/m   Gen: Alert, friendly older man. NAD. HEENT: NCAT. MMM. CV: RRR Resp: Normal WOB on RA. Abm: Soft, nontender, nondistended. Normal BS. Msk: Tender to palpation along L lateral lumbar back. Normal hip flexion and extension ROM.  ASSESSMENT/PLAN:   Dark stools C/f acute episode of dark vomit and stool ~6days. Nonbloody, not "coffee ground" appearance, not "sticky" stool. No abm pain or heartburn. Vomiting has since resolved, but stool still dark. Colonoscopy 2019 was unremarkable, recommended next colonoscopy in 2029. In setting of chronic NSAID use (no longer using) and ASA use, GI bleed is  considered. Hgb today is 12.8 (baseline 12.8-14), thus GI bleed less likely. Pt also started taking dark-colored supplements recently; could potentially contain iron. - Stop taking supplements - Follow-up in 2-4 wks to see if stools have returned to normal - Discussed pt to bring in supplements at next visit for provider to review - If still persistent, consider GI referral for EGD and/or colonoscopy  Back pain of lumbosacral region with sciatica Chronic L-sided back pain w/ sciatica. Previously taking Meloxicam chronically and was helpful, but had to stop given c/f chronic NSAID use. Tylenol helps, but wears off quickly. Recommended multi-modal pain approach.  - Continue tylenol 100mg  q6h prn for pain - Start gabapentin 100mg  TID prn for pain - Start lidocaine patch daily prn  - Continue exercises from physical therapy  Hypothyroidism TSH at goal.  - Continue Levothyroxine daily   Lincoln Brigham, MD [facility]

## 2023-01-04 DIAGNOSIS — R195 Other fecal abnormalities: Secondary | ICD-10-CM | POA: Insufficient documentation

## 2023-01-04 DIAGNOSIS — M5440 Lumbago with sciatica, unspecified side: Secondary | ICD-10-CM | POA: Insufficient documentation

## 2023-01-04 LAB — CBC
Hematocrit: 39.6 % (ref 37.5–51.0)
Hemoglobin: 12.8 g/dL — ABNORMAL LOW (ref 13.0–17.7)
MCH: 30.3 pg (ref 26.6–33.0)
MCHC: 32.3 g/dL (ref 31.5–35.7)
MCV: 94 fL (ref 79–97)
Platelets: 205 10*3/uL (ref 150–450)
RBC: 4.23 x10E6/uL (ref 4.14–5.80)
RDW: 13.3 % (ref 11.6–15.4)
WBC: 5.7 10*3/uL (ref 3.4–10.8)

## 2023-01-04 LAB — TSH: TSH: 0.805 u[IU]/mL (ref 0.450–4.500)

## 2023-01-04 NOTE — Assessment & Plan Note (Signed)
Chronic L-sided back pain w/ sciatica. Previously taking Meloxicam chronically and was helpful, but had to stop given c/f chronic NSAID use. Tylenol helps, but wears off quickly. Recommended multi-modal pain approach.  - Continue tylenol 100mg  q6h prn for pain - Start gabapentin 100mg  TID prn for pain - Start lidocaine patch daily prn  - Continue exercises from physical therapy

## 2023-01-04 NOTE — Assessment & Plan Note (Signed)
TSH at goal.  - Continue Levothyroxine daily

## 2023-01-04 NOTE — Assessment & Plan Note (Signed)
C/f acute episode of dark vomit and stool ~6days. Nonbloody, not "coffee ground" appearance, not "sticky" stool. No abm pain or heartburn. Vomiting has since resolved, but stool still dark. Colonoscopy 2019 was unremarkable, recommended next colonoscopy in 2029. In setting of chronic NSAID use (no longer using) and ASA use, GI bleed is considered. Hgb today is 12.8 (baseline 12.8-14), thus GI bleed less likely. Pt also started taking dark-colored supplements recently; could potentially contain iron. - Stop taking supplements - Follow-up in 2-4 wks to see if stools have returned to normal - Discussed pt to bring in supplements at next visit for provider to review - If still persistent, consider GI referral for EGD and/or colonoscopy

## 2023-01-07 ENCOUNTER — Telehealth: Payer: Self-pay | Admitting: Podiatry

## 2023-01-07 NOTE — Telephone Encounter (Signed)
Pt called requesting a refill on Tramadol. Please advise

## 2023-01-08 ENCOUNTER — Other Ambulatory Visit: Payer: Self-pay | Admitting: Podiatry

## 2023-01-08 MED ORDER — TRAMADOL HCL 50 MG PO TABS: 50.0000 mg | ORAL_TABLET | Freq: Three times a day (TID) | ORAL | 0 refills | Status: AC | PRN

## 2023-01-25 ENCOUNTER — Telehealth: Payer: Self-pay

## 2023-01-25 NOTE — Telephone Encounter (Signed)
Late entry*   Patient was contacted earlier this week to inform of Farxiga "shipment" ready for pick.   Medication was handed to me by Raymondo Band to notify patient of pickup.   Patient reports he would come by "sometime soon."  Medication is at my desk in RN room.

## 2023-01-31 ENCOUNTER — Ambulatory Visit: Payer: Medicare PPO | Admitting: Pharmacist

## 2023-02-04 ENCOUNTER — Other Ambulatory Visit: Payer: Self-pay

## 2023-02-04 DIAGNOSIS — N401 Enlarged prostate with lower urinary tract symptoms: Secondary | ICD-10-CM

## 2023-02-05 ENCOUNTER — Telehealth: Payer: Self-pay | Admitting: Pharmacist

## 2023-02-05 DIAGNOSIS — E119 Type 2 diabetes mellitus without complications: Secondary | ICD-10-CM

## 2023-02-05 MED ORDER — INSULIN PEN NEEDLE 32G X 4 MM MISC
1.0000 | 11 refills | Status: DC | PRN
Start: 2023-02-05 — End: 2023-06-04

## 2023-02-05 NOTE — Telephone Encounter (Signed)
Reviewed and agree with Dr Koval's plan.   

## 2023-02-05 NOTE — Telephone Encounter (Signed)
Patient contacted for follow-up of request for insulin needle tips.  Shared with patient that I had no sample or excess supply at this time.  I offered to send prescription to his pharmacy.  He accepted that plan.   New prescription for insulin pen needle tips provided.      Total time with patient call and documentation of interaction: 9 minutes.

## 2023-02-07 MED ORDER — TAMSULOSIN HCL 0.4 MG PO CAPS: 0.8000 mg | ORAL_CAPSULE | Freq: Every day | ORAL | 3 refills | Status: DC

## 2023-02-08 ENCOUNTER — Other Ambulatory Visit: Payer: Self-pay | Admitting: Family Medicine

## 2023-02-08 DIAGNOSIS — N529 Male erectile dysfunction, unspecified: Secondary | ICD-10-CM

## 2023-02-11 ENCOUNTER — Other Ambulatory Visit: Payer: Self-pay

## 2023-02-11 DIAGNOSIS — E039 Hypothyroidism, unspecified: Secondary | ICD-10-CM

## 2023-02-11 MED ORDER — LEVOTHYROXINE SODIUM 75 MCG PO TABS
75.0000 ug | ORAL_TABLET | Freq: Every day | ORAL | 3 refills | Status: DC
Start: 2023-02-11 — End: 2023-08-27

## 2023-02-12 ENCOUNTER — Telehealth: Payer: Self-pay

## 2023-02-12 ENCOUNTER — Other Ambulatory Visit (HOSPITAL_COMMUNITY): Payer: Self-pay

## 2023-02-12 NOTE — Telephone Encounter (Signed)
Patient LVM on nurse line requesting that message be sent to Dr. Raymondo Band regarding the cost of Lantus.   Called pharmacy. Pharmacist reports that Lantus is $93 with insurance. Patient is unable to afford this. Pharmacy was unable to tell me if this was insurance preferred or if there is an insurance preferred alternative.   Patient is requesting possible alternative.   Please advise.   Veronda Prude, RN

## 2023-02-13 NOTE — Telephone Encounter (Signed)
Attempted to call patient twice. No ring and voicemail is full.  Suggest patient asks the pharmacy to fill the medication for a 30 day supply if applicable. Im assuming his copay may be high due to them billing the box of insulin for a 37 day supply (possibly paying a 2 month copay).  Pt's novo nordisk shipment of ozempic is also ready in the med room fridge - 4 boxes of Ozempic 1mg  dose pens.

## 2023-02-13 NOTE — Telephone Encounter (Signed)
Patient returns call to nurse line. Advised of message per [place].   Patient is asking that I call pharmacy to ask about 30 day supply. I was told by pharmacist that they cannot break a box and that the cost will not be less that the $93.   Veronda Prude, RN

## 2023-02-15 NOTE — Telephone Encounter (Signed)
Patient contacted for follow-up of insulin supply and cost issue.  Patient reports he purchased the insulin recently at his local pharmacy which is why he believes it is higher cost.   Review of his formulary may require [place] to use Tresiba, Toujeo MAX (Not glargine vial which is what may be covered with glargine U-100)  Patient has supply of insulin and Libre 3 sensors adequate to cover until his next appointment - scheduled for 8/26 (6 month Liberate F/U)   Total time with patient call and documentation of interaction: 14 minutes.

## 2023-02-28 ENCOUNTER — Telehealth: Payer: Self-pay

## 2023-02-28 NOTE — Telephone Encounter (Signed)
Attempted to call pt regarding novo nordisk shipment pickup. Voicemail full.  Sent patient MyChart message.   4 boxes of Ozempic 1mg  dose pens are ready in med room fridge.

## 2023-03-04 ENCOUNTER — Encounter: Payer: Self-pay | Admitting: Pharmacist

## 2023-03-04 ENCOUNTER — Encounter (INDEPENDENT_AMBULATORY_CARE_PROVIDER_SITE_OTHER): Payer: Medicare PPO | Admitting: Pharmacist

## 2023-03-04 VITALS — BP 137/85 | HR 65 | Ht 70.0 in | Wt 208.2 lb

## 2023-03-04 DIAGNOSIS — E119 Type 2 diabetes mellitus without complications: Secondary | ICD-10-CM

## 2023-03-04 LAB — POCT GLYCOSYLATED HEMOGLOBIN (HGB A1C): HbA1c, POC (controlled diabetic range): 8.8 % — AB (ref 0.0–7.0)

## 2023-03-04 MED ORDER — INSULIN GLARGINE 100 UNIT/ML SOLOSTAR PEN
14.0000 [IU] | PEN_INJECTOR | Freq: Every day | SUBCUTANEOUS | Status: DC
Start: 2023-03-04 — End: 2023-05-17

## 2023-03-04 MED ORDER — FREESTYLE LIBRE 3 SENSOR MISC
11 refills | Status: DC
Start: 2023-03-04 — End: 2023-03-20

## 2023-03-04 NOTE — Addendum Note (Signed)
Addended by: Kathrin Ruddy on: 03/04/2023 02:36 PM   Modules accepted: Orders

## 2023-03-04 NOTE — Research (Signed)
S:    Chief Complaint  Patient presents with   Medication Management    Liberate 6 month follow-up   67 y.o. male who presents for diabetes evaluation, education, and management in the context of the LIBERATE Study.  PMH is significant for T2DM, stroke, CKD, HTN, HLD, gout.   Patient was referred and last seen by Primary Care Provider, Dr. Lincoln Brigham, on 01/03/23.  Patient was last seen in pharmacy clinic on 12/27/22. At last visit, education was provided on proper medication regimen and administration.  Today, patient arrives in good spirits and presents without any assistance.   Patient reports Diabetes was diagnosed in 2008.   Current diabetes medications include: 15 clicks of Ozempic (semaglutide) on the 1mg  pen once weekly (Sundays), Lantus (insulin glargine) 10 units once daily, metformin 1,000 units twice daily, Farxiga (dapagliflozin) 10mg  once daily  - prescription and instructions at prior appointment told patient to take 40 units of Lantus (insulin glargine), but patient has been taking 10 units daily since that is what the instructions on one of his boxes of insulin stated. He said he "planned to start the 40 units daily (instructions on new box) once he ran out of the insulin from the current box".   Current hypertension medications include: amlodipine 10mg  once daily, clonidine 0.1mg  twice daily, losartan 100mg  once daily Current hyperlipidemia medications include: rosuvastatin 20mg  once daily  Patient reports adherence to taking all medications "as written on the box" - be cautious of this when adjusting dose of medication regimen as patient may wait to titrate until finishing supply of current dose due to instructions written on that prescription.  Have you been experiencing any side effects to the medications prescribed? no Do you have any problems obtaining medications due to transportation or finances? no Insurance coverage: [employer]  O:  Review of  Systems  All other systems reviewed and are negative.  Physical Exam Constitutional:      Appearance: Normal appearance.  Pulmonary:     Effort: Pulmonary effort is normal.  Neurological:     Mental Status: He is alert.  Psychiatric:        Mood and Affect: Mood normal.        Behavior: Behavior normal.        Thought Content: Thought content normal.        Judgment: Judgment normal.    CGM Download:  % Time CGM is active: 96% Average Glucose: 245 mg/dL Glucose Management Indicator: 9.2 (up from 8.7 on last CGM report on 12/27/22)  Glucose Variability: 13.4 (goal <36%) Time in Goal:  - Time in range 70-180: 2% (down from 8% on last CGM report 12/27/22) - Time above range: 98% - Time below range: 0%  Lab Results  Component Value Date   HGBA1C 8.8 (A) 03/04/2023   Vitals:   03/04/23 0942  BP: 137/85  Pulse: 65  SpO2: 100%   Lipid Panel     Component Value Date/Time   CHOL 99 (L) 08/24/2022 1056   TRIG 122 08/24/2022 1056   HDL 48 08/24/2022 1056   CHOLHDL 2.1 08/24/2022 1056   CHOLHDL 2.5 10/26/2014 1118   VLDL 20 10/26/2014 1118   LDLCALC 29 08/24/2022 1056   LDLDIRECT 72 06/04/2012 0948    Clinical Atherosclerotic Cardiovascular Disease (ASCVD): Yes  The ASCVD Risk score (Arnett DK, et al., 2019) failed to calculate for the following reasons:   The patient has a prior MI or stroke diagnosis   Patient  is participating in a Managed Medicaid Plan: No  A/P: LIBERATE Study:  - Patient is on insulin, so insurance [facility]) should cover continued supply of Libre 3 sensors. Sent prescription for sensors to [facility] [employer] [facility].  Diabetes longstanding since 2008 currently uncontrolled. Patient is able to verbalize appropriate hypoglycemia management plan. Medication adherence appears. Control is suboptimal due to patient having difficulty understanding updated medication regimens. Patient follows written instructions on prescriptions, which is a barrier to dose  adjustments when patient is has only taken a portion of a prescription fill. - Increase GLP-1 Ozempic (semaglutide) to 20 clicks starting this Sunday 9/1 - Starting tomorrow (8/27), increase basal insulin Lantus (insulin glargine) to 14 units once daily for one week (8/27-9/1).Then, increase to 18 units once daily for one week (9/2-9/8). Then, increase to 22 units once daily thereafter until follow-up visit. - Continue SGLT-2 Farxiga (dapagliflozin) 10mg  once daily - Continue metformin 1000mg  twice daily. - Provided written instructions with dates to further assist patient with medication titration schedule. Patient expressed he will place instructions next to medications and follow these written instructions.  -Patient educated on purpose, proper use, and potential adverse effects.  -Extensively discussed pathophysiology of diabetes, recommended lifestyle interventions, dietary effects on blood sugar control.  -Counseled on s/sx of and management of hypoglycemia.  -Next A1c anticipated 3 months (end of November 2024).   ASCVD risk - secondary prevention in patient with diabetes. Last LDL is at goal of <55 mg/dL. high intensity statin indicated.  -Continued rosuvastatin 20 mg once daily.   Hypertension longstanding currently above blood pressure goal of <130 mmHg. Blood pressure was at goal at last visit, and no medications were adjusted. Medication adherence optimal. -Continued all blood pressure medications. Re-assess blood pressure at follow-up visit.  Written patient instructions provided. Patient verbalized understanding of treatment plan.  Total time in face to face counseling 27 minutes.    Follow-up:  Pharmacist 3 weeks on 03/25/23. - plan to complete DDS at that visit.  PCP clinic visit to be determined at follow-up pharmacy visit.  Patient seen with Rickey Primus, PharmD Candidate.

## 2023-03-04 NOTE — Assessment & Plan Note (Signed)
LIBERATE Study:  - Patient is on insulin, so insurance [facility]) should cover continued supply of Libre 3 sensors. Sent prescription for sensors to [facility].  Diabetes longstanding since 2008 currently uncontrolled. Patient is able to verbalize appropriate hypoglycemia management plan. Medication adherence appears. Control is suboptimal due to patient having difficulty understanding updated medication regimens. Patient follows written instructions on prescriptions, which is a barrier to dose adjustments when patient is has only taken a portion of a prescription fill. - Increase GLP-1 Ozempic (semaglutide) to 20 clicks starting this Sunday 9/1 - Starting tomorrow (8/27), increase basal insulin Lantus (insulin glargine) to 14 units once daily for one week (8/27-9/1).Then, increase to 18 units once daily for one week (9/2-9/8). Then, increase to 22 units once daily thereafter until follow-up visit. - Continue SGLT-2 Farxiga (dapagliflozin) 10mg  once daily - Continue metformin 1000mg  twice daily. - Provided written instructions with dates to further assist patient with medication titration schedule. Patient expressed he will place instructions next to medications and follow these written instructions.  -Patient educated on purpose, proper use, and potential adverse effects.

## 2023-03-04 NOTE — Patient Instructions (Addendum)
It was nice to see you today!  Your goal blood sugar is 80-130 before eating and less than 180 after eating.  Medication Changes: Increase Ozempic (semaglutide) to 20 clicks starting this Sunday 9/1  Starting tomorrow (8/27), increase Lantus (insulin glargine) to 14 units once daily for one week (8/27-9/1).  Then, increase to 18 units once daily for one week (9/2-9/8).  Then, increase to 22 units once daily thereafter until follow-up visit.  Continue all other medication the same.  Monitor blood sugars at home and keep a log (glucometer or piece of paper) to bring with you to your next visit.  Keep up the good work with diet and exercise. Aim for a diet full of vegetables, fruit and lean meats (chicken, [place], fish). Try to limit salt intake by eating fresh or frozen vegetables (instead of canned), rinse canned vegetables prior to cooking and do not add any additional salt to meals.

## 2023-03-05 NOTE — Telephone Encounter (Signed)
Medication given to patient

## 2023-03-05 NOTE — Progress Notes (Signed)
Reviewed and agree with Dr Koval's plan.   

## 2023-03-14 ENCOUNTER — Ambulatory Visit (INDEPENDENT_AMBULATORY_CARE_PROVIDER_SITE_OTHER): Payer: Medicare HMO

## 2023-03-14 DIAGNOSIS — Z23 Encounter for immunization: Secondary | ICD-10-CM

## 2023-03-14 NOTE — Progress Notes (Signed)
Patient presents to nurse clinic for Flu Vaccine.  See admin for details.  Patient tolerated injection without complication.

## 2023-03-20 ENCOUNTER — Telehealth: Payer: Self-pay

## 2023-03-20 DIAGNOSIS — E119 Type 2 diabetes mellitus without complications: Secondary | ICD-10-CM

## 2023-03-20 MED ORDER — FREESTYLE LIBRE 3 SENSOR MISC
11 refills | Status: DC
Start: 2023-03-20 — End: 2023-08-12

## 2023-03-20 NOTE — Telephone Encounter (Signed)
Patient calls nurse line in regards to [employer].   He reports [facility] has no stock of [employer] at this time.   He asks we send them to [facility] on [address]   Will resend.

## 2023-03-25 ENCOUNTER — Ambulatory Visit: Payer: Medicare PPO | Admitting: Pharmacist

## 2023-03-26 ENCOUNTER — Encounter: Payer: Self-pay | Admitting: Pharmacist

## 2023-03-26 ENCOUNTER — Ambulatory Visit (INDEPENDENT_AMBULATORY_CARE_PROVIDER_SITE_OTHER): Payer: Medicare HMO | Admitting: Pharmacist

## 2023-03-26 VITALS — BP 116/60 | HR 65 | Ht 71.0 in | Wt 208.4 lb

## 2023-03-26 DIAGNOSIS — E119 Type 2 diabetes mellitus without complications: Secondary | ICD-10-CM

## 2023-03-26 DIAGNOSIS — E1169 Type 2 diabetes mellitus with other specified complication: Secondary | ICD-10-CM | POA: Diagnosis not present

## 2023-03-26 DIAGNOSIS — I10 Essential (primary) hypertension: Secondary | ICD-10-CM

## 2023-03-26 DIAGNOSIS — E785 Hyperlipidemia, unspecified: Secondary | ICD-10-CM | POA: Diagnosis not present

## 2023-03-26 NOTE — Progress Notes (Signed)
S:     Chief Complaint  Patient presents with   Medication Management    Diabetes   67 y.o. male who presents for diabetes evaluation, education, and management. Patient arrives in good spirits and presents without any assistance.   Patient was referred and last seen by Primary Care Provider, Dr. Sherrilee Gilles, on 01/03/23.   PMH is significant for T2DM, stroke, CKD, HTN, HLD, gout.  At last visit, increased Ozempic (semaglutide) to 20 clicks, increased Lantus (insulin glargine) to 14 units once daily for 1 week, then instructed to increas to 18 units once daily for 1 week, and then increase to 22 units once daily until follow-up visit.   Patient reports Diabetes was diagnosed in 2008.   Current diabetes medications include: Ozempic (semaglutide) 1 mg once weekly, metformin XR 1000 mg twice daily (patient reports taking differently - takes 1000 mg once daily in the AM), Lantus (insulin glargine) patient reports taking differently than instructed - patient has been taking 14 units once daily and has titrated it up to 18 units once daily "not too long ago" Current hypertension medications include: amlodipine 10 mg once daily, losartan 100 mg once daily Current hyperlipidemia medications include: rosuvastatin 20 mg once daily  Patient reports non-adherence to medications - patient states he forgets to take metformin XR at bedtime. Patient reports not taking any bedtime doses of metformin XR in the last week.  Do you feel that your medications are working for you? yes Have you been experiencing any side effects to the medications prescribed? no Do you have any problems obtaining medications due to transportation or finances? no Insurance coverage: [facility] Medicare  Patient denies hypoglycemic events.  Patient denies nocturia (nighttime urination).  Patient denies neuropathy (nerve pain). Patient denies visual changes. Patient denies self foot exams.   Patient reported dietary habits: Eats  2 meals/day Breakfast: cake or honey & oat wheat bars, cup of tea with sweetener Lunch:  meat, veg, and rice/starch or noodles with meat Dinner: meat, veg, and rice/starch or noodles with meat Snacks: none Drinks: apple juice if sugar is low (BG in 100s), tea with sweetener  Patient-reported exercise habits: weight lifting in the morning and before bedtime   O:   Review of Systems  All other systems reviewed and are negative.   Physical Exam Constitutional:      Appearance: Normal appearance.  Pulmonary:     Effort: Pulmonary effort is normal.  Neurological:     Mental Status: He is alert.  Psychiatric:        Mood and Affect: Mood normal.        Behavior: Behavior normal.        Thought Content: Thought content normal.        Judgment: Judgment normal.     7 day average blood glucose: 247 mg/dL  Libre3 CGM Download today on 03/26/23 % Time CGM is active: 90% Average Glucose: 274 mg/dL Glucose Management Indicator: 9.2%  Glucose Variability: 15.3% (goal <36%) Time in Goal:  - Time in range 70-180: 3% - Time above range: 97% - Time below range: 0%   Lab Results  Component Value Date   HGBA1C 8.8 (A) 03/04/2023   Vitals:   03/26/23 1008  BP: 116/60  Pulse: 65  SpO2: 100%    Lipid Panel     Component Value Date/Time   CHOL 99 (L) 08/24/2022 1056   TRIG 122 08/24/2022 1056   HDL 48 08/24/2022 1056   CHOLHDL 2.1 08/24/2022  1056   CHOLHDL 2.5 10/26/2014 1118   VLDL 20 10/26/2014 1118   LDLCALC 29 08/24/2022 1056   LDLDIRECT 72 06/04/2012 0948    Clinical Atherosclerotic Cardiovascular Disease (ASCVD): Yes  History of stroke  A/P: Diabetes longstanding currently uncontrolled with latest GMI of 9.2%. Patient is able to verbalize appropriate hypoglycemia management plan. Medication adherence appears suboptimal. Control is suboptimal due to patient taking less Lantus (insulin glargine) than prescribed and non-adherence with bedtime dose of metformin  XR. - Increased Lantus (insulin glargine) to 22 units once daily. - Instructed patient to take 4 tablets of metformin XR (500 mg tablet) in the morning.  -Patient educated on purpose, proper use, and potential adverse effects.  -Extensively discussed pathophysiology of diabetes, recommended lifestyle interventions, dietary effects on blood sugar control.  -Counseled on s/sx of and management of hypoglycemia.   ASCVD risk - secondary prevention in patient with diabetes. Last LDL is 29 at goal of <70 mg/dL.  -Continued rosuvastatin 20 mg once daily.   Hypertension longstanding currently controlled with in-office reading of 116/60 mm Hg. Blood pressure goal of <130/80 mmHg. Medication adherence good.  -Continued amlodipine 10 mg once daily. - Continued losartan 100 mg once daily. Patient was initially confused as to whether he takes this medication. Instructed patient to double check to ensure he has this medication at home.  Instructed patient to bring all medications to next appointment.  Written patient instructions provided. Patient verbalized understanding of treatment plan.  Total time in face to face counseling 39 minutes.    Follow-up:  Pharmacist visit with Dr. Raymondo Band on 04/09/23 PCP clinic visit PRN Patient seen with Alesia Banda, PharmD Candidate and Andee Poles, PharmD Candidate.

## 2023-03-26 NOTE — Assessment & Plan Note (Signed)
Diabetes longstanding currently uncontrolled with latest GMI of 9.2%. Patient is able to verbalize appropriate hypoglycemia management plan. Medication adherence appears suboptimal. Control is suboptimal due to patient taking less Lantus (insulin glargine) than prescribed and non-adherence with bedtime dose of metformin XR. - Increased Lantus (insulin glargine) to 22 units once daily. - Instructed patient to take 4 tablets of metformin XR (500 mg tablet) in the morning.  -Patient educated on purpose, proper use, and potential adverse effects.  -Extensively discussed pathophysiology of diabetes, recommended lifestyle interventions, dietary effects on blood sugar control.

## 2023-03-26 NOTE — Assessment & Plan Note (Signed)
Hypertension longstanding currently controlled with in-office reading of 116/60 mm Hg. Blood pressure goal of <130/80 mmHg. Medication adherence good.  -Continued amlodipine 10 mg once daily. - Continued losartan 100 mg once daily. Patient was initially confused as to whether he takes this medication. Instructed patient to double check to ensure he has this medication at home.

## 2023-03-26 NOTE — Assessment & Plan Note (Signed)
ASCVD risk - secondary prevention in patient with diabetes. Last LDL is 29 at goal of <70 mg/dL.  -Continued rosuvastatin 20 mg once daily.

## 2023-03-26 NOTE — Patient Instructions (Signed)
It was nice to see you today!  Your goal blood sugar is 80-130 before eating and less than 180 after eating.  Medication Changes:  Increase Lantus (insulin glargine) to 22 units once daily in the morning.  Start taking 4 tablet of metformin XR in the morning.  Double check that you have the Cozaar (losartan) blood pressure medication at home.  BRING ALL MEDICATIONS TO NEXT APPOINTMENT.  Continue all other medication the same.   Keep up the good work with diet and exercise. Aim for a diet full of vegetables, fruit and lean meats (chicken, [place], fish). Try to limit salt intake by eating fresh or frozen vegetables (instead of canned), rinse canned vegetables prior to cooking and do not add any additional salt to meals.

## 2023-03-28 NOTE — Progress Notes (Signed)
Reviewed and agree with Dr Koval's plan.   

## 2023-04-09 ENCOUNTER — Encounter: Payer: Self-pay | Admitting: Pharmacist

## 2023-04-09 ENCOUNTER — Ambulatory Visit: Payer: Medicare HMO | Admitting: Pharmacist

## 2023-04-09 VITALS — BP 121/53 | HR 66 | Wt 205.0 lb

## 2023-04-09 DIAGNOSIS — E11649 Type 2 diabetes mellitus with hypoglycemia without coma: Secondary | ICD-10-CM | POA: Diagnosis not present

## 2023-04-09 DIAGNOSIS — Z7985 Long-term (current) use of injectable non-insulin antidiabetic drugs: Secondary | ICD-10-CM

## 2023-04-09 DIAGNOSIS — E119 Type 2 diabetes mellitus without complications: Secondary | ICD-10-CM

## 2023-04-09 NOTE — Progress Notes (Signed)
    S:     Chief Complaint  Patient presents with   Medication Management    Diabetes    67 yo male who presents for diabetes evaluation, education, and management. Patient arrives in good spirits and presents without any assistance.    Patient was referred and last seen by Primary Care Provider, Dr. Sherrilee Gilles, on 01/03/23.    PMH is significant for T2DM, stroke, CKD, HTN, HLD, gout.  At last visit, increased Ozempic (semaglutide) to 20 clicks, increased Lantus (insulin glargine) at 22 units once daily.  Patient reports adherence to taking all medications as prescribed.     O:   Review of Systems  Musculoskeletal:  Positive for joint pain (left shoulder).  All other systems reviewed and are negative.   Physical Exam Vitals reviewed.  Constitutional:      Appearance: Normal appearance.  Pulmonary:     Effort: Pulmonary effort is normal.  Neurological:     Mental Status: He is alert.  Psychiatric:        Mood and Affect: Mood normal.        Behavior: Behavior normal.        Thought Content: Thought content normal.        Judgment: Judgment normal.    7 day average blood glucose:  Libre3 CGM Download today on 04/09/2023 % Time CGM is active: 99% Average Glucose: 208 mg/dL Glucose Management Indicator: 8.3  Glucose Variability: 21.1% (goal <36%) Time in Goal:  - Time in range 70-180: 29% - Time above range: 71% - Time below range: 0% Observed patterns: Improved since last visit.   Lab Results  Component Value Date   HGBA1C 8.8 (A) 03/04/2023   Vitals:   04/09/23 0956  BP: (!) 121/53  Pulse: 66  SpO2: 100%    Lipid Panel     Component Value Date/Time   CHOL 99 (L) 08/24/2022 1056   TRIG 122 08/24/2022 1056   HDL 48 08/24/2022 1056   CHOLHDL 2.1 08/24/2022 1056   CHOLHDL 2.5 10/26/2014 1118   VLDL 20 10/26/2014 1118   LDLCALC 29 08/24/2022 1056   LDLDIRECT 72 06/04/2012 0948    Clinical Atherosclerotic Cardiovascular Disease (ASCVD): Yes  Hx of  Stroke.   A/P: Diabetes longstanding currently uncontrolled with latest A1c of 8.8%. Patient is able to verbalize appropriate hypoglycemia management plan. Medication adherence appears suboptimal. Control is improved with use of Lantus (insulin glargine) 22 units once daily in the AM and 2000mg  daily of metformin XR (4 of the 500mg  tablets daily). - Continued Lantus (insulin glargine) to 22 units once daily. - Continued 3 tablets of metformin XR (500 mg tablet) in the morning and 1 tablet at lunch.  - Continued Ozempic (semaglutide) - same dose.  -Patient educated on purpose, proper use, and potential adverse effects.  -Extensively discussed pathophysiology of diabetes, recommended lifestyle interventions, dietary effects on blood sugar control.  -Counseled on s/sx of and management of hypoglycemia.   Left Shoulder pain - decreased range of motion with moderate pain.  - Evaluation planned 10/11 with Dr. Velna Ochs.   Written patient instructions provided. Patient verbalized understanding of treatment plan.  Total time in face to face counseling 21 minutes.    Follow-up:  Pharmacist 06/11/2023 PCP clinic visit in 10/11 for shoulder evaluation. Patient seen with Caprice Beaver, PharmD Candidate and Shona Simpson, PharmD Candidate.

## 2023-04-09 NOTE — Assessment & Plan Note (Signed)
Diabetes longstanding currently uncontrolled with latest A1c of 8.8%. Patient is able to verbalize appropriate hypoglycemia management plan. Medication adherence appears suboptimal. Control is improved with use of Lantus (insulin glargine) 22 units once daily in the AM and 2000mg  daily of metformin XR (4 of the 500mg  tablets daily). - Continued Lantus (insulin glargine) to 22 units once daily. - Continued 3 tablets of metformin XR (500 mg tablet) in the morning and 1 tablet at lunch.  - Continued Ozempic (semaglutide) - same dose.  -Patient educated on purpose, proper use, and potential adverse effects.  -Extensively discussed pathophysiology of diabetes, recommended lifestyle interventions, dietary effects on blood sugar control.  -Counseled on s/sx of and management of hypoglycemia.

## 2023-04-09 NOTE — Patient Instructions (Addendum)
It was nice to see you today!  Your goal blood sugar is 80-130 before eating and less than 180 after eating.  Medication Changes: Continue all other medication the same.   Keep up the good work with diet and exercise. Aim for a diet full of vegetables, fruit and lean meats (chicken, [place], fish). Try to limit salt intake by eating fresh or frozen vegetables (instead of canned), rinse canned vegetables prior to cooking and do not add any additional salt to meals.   Next visit for your shoulder.   Plan to come back to see me in the pharmacist clinic in ~ 8 weeks (early December).   12/3 at 9:00 AM

## 2023-04-15 NOTE — Progress Notes (Signed)
Reviewed and agree with Dr Koval's plan.   

## 2023-04-19 ENCOUNTER — Ambulatory Visit: Payer: Medicare HMO | Admitting: Family Medicine

## 2023-04-19 ENCOUNTER — Encounter: Payer: Self-pay | Admitting: Family Medicine

## 2023-04-19 VITALS — BP 121/74 | HR 77 | Ht 71.0 in | Wt 210.1 lb

## 2023-04-19 DIAGNOSIS — M7542 Impingement syndrome of left shoulder: Secondary | ICD-10-CM | POA: Diagnosis not present

## 2023-04-19 NOTE — Progress Notes (Signed)
    SUBJECTIVE:   CHIEF COMPLAINT / HPI: shoulder pain  80-month gradual onset left shoulder pain [facility] Located at the [facility] joint Nonradiating, no numbness or tingling Worse with any motion above 90 degrees flexion and abduction Has significantly limited his range of motion Has history of osteoarthritis in both his knees requiring 2 knee replacements Has tried Tylenol with mild relief  Reviewed previous left shoulder x-ray with mild glenohumeral degenerative changes  PERTINENT  PMH / PSH: HTN, T2DM, Hypothyroidism, Arthritis  OBJECTIVE:   BP 121/74   Pulse 77   Ht 5\' 11"  (1.803 m)   Wt 210 lb 2 oz (95.3 kg)   SpO2 100%   BMI 29.31 kg/m   General: NAD, well appearing Neuro: A&O Respiratory: normal WOB on RA Extremities: Moving all 4 extremities equally  Left shoulder Inspection: No obvious deformity, erythema, swelling, ecchymoses Palpation: NTTP at clavicle, AC joint, acromion, scapular ridge, biceps tendon ROM: Active range of motion limited to 90 degrees in shoulder flexion and abduction due to pain, passive range of motion full in flexion abduction, internal and external rotation full Special Tests: Positive Hawkins, positive Neer's, positive crossarm's, negative empty can Strength: Resisted shoulder flexion and supination 5/5 but reproduces pain, internal/external rotation 5/5,    ASSESSMENT/PLAN:   Assessment & Plan Subacromial impingement of left shoulder Left shoulder pain likely due to subacromial impingement secondary to [facility] joint osteoarthritis.  Exam reassuring against frozen shoulder and rotator cuff partial/complete tear.  Start conservative therapy as below, discussed with the patient who is agreeable to plan. -Rotator cuff home exercise regimen provided -Tylenol as needed for pain -Voltaren 2-3 times per day -Scheduled for left subacromial injection with Dr. Barb Merino on 10/15 -Follow-up 6 weeks -Consider repeat left shoulder x-rays  Return in about 6  weeks (around 05/31/2023).  Celine Mans, MD [facility]

## 2023-04-19 NOTE — Patient Instructions (Signed)
It was great to see you! Thank you for allowing me to participate in your care!  Our plans for today:  - Please do your home exercises to strengthen your rotator cuff. Please do these twice per day 3-4 times per week. - I will see you again in 6 weeks. - You are scheduled with Dr. Barb Merino next Tuesday for a left shoulder injection. - You may use Tylenol as needed for pain, and Voltaren Gel 2-3 times per day.   Please arrive 15 minutes PRIOR to your next scheduled appointment time! If you do not, this affects OTHER patients' care.  Take care and seek immediate care sooner if you develop any concerns.   Celine Mans, MD, PGY-2 [facility] 9:10 AM 04/19/2023  [facility]

## 2023-04-22 ENCOUNTER — Telehealth: Payer: Self-pay | Admitting: Family Medicine

## 2023-04-22 NOTE — Telephone Encounter (Signed)
Form has been placed in your box to be completed, please finish when you have time. Once you are done, please place the form in the RN box near the front office. Thanks! Penni Bombard CMA

## 2023-04-22 NOTE — Telephone Encounter (Signed)
Patient is dropping off paper work for [employer] for Dr. Sherrilee Gilles to completed. LDOS 04/19/23.  Form is to be faxed. ROI completed.   I have placed form in red team folder.

## 2023-04-23 ENCOUNTER — Other Ambulatory Visit: Payer: Self-pay

## 2023-04-23 ENCOUNTER — Ambulatory Visit: Payer: Medicare HMO | Admitting: Family Medicine

## 2023-04-23 DIAGNOSIS — M5430 Sciatica, unspecified side: Secondary | ICD-10-CM

## 2023-04-23 MED ORDER — GABAPENTIN 100 MG PO CAPS
100.0000 mg | ORAL_CAPSULE | Freq: Three times a day (TID) | ORAL | 3 refills | Status: DC | PRN
Start: 2023-04-23 — End: 2023-07-23

## 2023-04-24 ENCOUNTER — Ambulatory Visit (INDEPENDENT_AMBULATORY_CARE_PROVIDER_SITE_OTHER): Payer: Medicare HMO | Admitting: Student

## 2023-04-24 VITALS — BP 116/60 | HR 61 | Ht 71.0 in | Wt 211.4 lb

## 2023-04-24 DIAGNOSIS — M25512 Pain in left shoulder: Secondary | ICD-10-CM | POA: Diagnosis not present

## 2023-04-24 LAB — HM DIABETES EYE EXAM

## 2023-04-24 MED ORDER — METHYLPREDNISOLONE ACETATE 40 MG/ML IJ SUSP
40.0000 mg | Freq: Once | INTRAMUSCULAR | Status: AC
Start: 2023-04-24 — End: 2023-04-24
  Administered 2023-04-24: 40 mg via INTRAMUSCULAR

## 2023-04-24 NOTE — Telephone Encounter (Signed)
Form faxed to provided number. Copy made and placed in batch scanning.   Veronda Prude, RN

## 2023-04-24 NOTE — Patient Instructions (Signed)
It was great to see you! Thank you for allowing me to participate in your care!   Our plans for today:  - call if fevers/chills, redness, drainage, or persistent bleeding.  - Let us know if sugars get higher and we need to adjust insulin  Take care and seek immediate care sooner if you develop any concerns.  Levin Erp, MD

## 2023-04-24 NOTE — Progress Notes (Signed)
    SUBJECTIVE:   CHIEF COMPLAINT / HPI: [place] injections  Was seen on 10/11 for left shoulder subacromial impingement.  Told to follow-up for shoulder injection.  PERTINENT  PMH / PSH: T2DM, HTN OBJECTIVE:   BP 116/60   Pulse 61   Ht 5\' 11"  (1.803 m)   Wt 211 lb 6.4 oz (95.9 kg)   SpO2 100%   BMI 29.48 kg/m   Procedure performed: left subacromial corticosteroid injection; palpation guided  Consent obtained and verified. Time-out conducted. Noted no overlying erythema, induration, or other signs of local infection. The left posterior subacromial space was palpated and marked. The overlying skin was prepped in a sterile fashion. Topical analgesic spray: Ethyl chloride. Joint: left subacromial Needle: 25 gauge, 1.5 inch Completed without difficulty. Meds: 3 cc 1% lidocaine without epinephrine, 1 cc 40 mg depo-medrol   Advised to call if fevers/chills, erythema, induration, drainage, or persistent bleeding.   ASSESSMENT/PLAN:   Assessment & Plan Left shoulder pain, unspecified chronicity Left shoulder pain due to impingement.  Subacromial injection performed without issue. -Return precautions discussed -Discussed possibility of worsening pain for the next 1 to 2 days with improvement after this -Discussed possibility of increased glucose with steroid injection however less likely given in joint space but to message [place] if sugars start to increase     Levin Erp, MD [facility]

## 2023-05-07 ENCOUNTER — Other Ambulatory Visit: Payer: Self-pay | Admitting: *Deleted

## 2023-05-08 MED ORDER — CLONIDINE HCL 0.1 MG PO TABS: 0.1000 mg | ORAL_TABLET | Freq: Two times a day (BID) | ORAL | 3 refills | Status: DC

## 2023-05-16 ENCOUNTER — Telehealth: Payer: Self-pay | Admitting: Family Medicine

## 2023-05-16 DIAGNOSIS — E119 Type 2 diabetes mellitus without complications: Secondary | ICD-10-CM

## 2023-05-16 NOTE — Telephone Encounter (Signed)
Patient walked in to request [employer] document be given to Dr. Raymondo Band.  Document place in Dr. Macky Lower box.  Patient request a call from Dr. Raymondo Band; [phone]

## 2023-05-17 MED ORDER — SEMAGLUTIDE (1 MG/DOSE) 4 MG/3ML ~~LOC~~ SOPN
1.0000 mg | PEN_INJECTOR | SUBCUTANEOUS | Status: DC
Start: 2023-05-17 — End: 2023-08-27

## 2023-05-17 MED ORDER — LANTUS SOLOSTAR 100 UNIT/ML ~~LOC~~ SOPN: 22.0000 [IU] | PEN_INJECTOR | Freq: Every day | SUBCUTANEOUS | 99 refills | Status: DC

## 2023-05-17 NOTE — Assessment & Plan Note (Signed)
Patient contacted for follow-up of request for formulary adjustment as Insulin Glargine - YFGN is no longer preferred.   Since last contact patient reports his blood sugars are doing well.  Review of CGM report shows he has  GMI 7.8 Avg. Glucose 189 Variability 16.1% and  TIR of 43% with NO lows or symptoms of low glucose.   Current Medications include: Semaglutide 18 clicks weekly with 1mg  pen AND Insulin glargine at 22 units once daily in the AM.  Patient denies any significant medication related side effects.  Medication Plan: - Increase dose Insulin Glargine from 22 units to 24 units once daily in AM with New prescription - formulary switch for [facility] insurance  - Increase dose Ozempic (semaglutide) from 18 clicks to 20 clicks once weekly using 1mg  pen.

## 2023-05-17 NOTE — Telephone Encounter (Signed)
Patient contacted for follow-up of request for formulary adjustment as Insulin Glargine - YFGN is no longer preferred.   Since last contact patient reports his blood sugars are doing well.  Review of CGM report shows he has  GMI 7.8 Avg. Glucose 189 Variability 16.1% and  TIR of 43% with NO lows or symptoms of low glucose.   Current Medications include: Semaglutide 18 clicks weekly with 1mg  pen AND Insulin glargine at 22 units once daily in the AM.  Patient denies any significant medication related side effects.  Medication Plan: - Increase dose Insulin Glargine from 22 units to 24 units once daily in AM with New prescription - formulary switch for [facility] insurance  - Increase dose Ozempic (semaglutide) from 18 clicks to 20 clicks once weekly using 1mg  pen.    Patient verbalized and repeated back above treatment plan X2  Total time with patient call and documentation of interaction: 19 minutes.  Clarified next visit is with Dr. Velna Ochs 11/25 THEN follow-up with me on 12/3 Anticipate next A1C will be much improved.

## 2023-05-20 NOTE — Telephone Encounter (Signed)
Reviewed and agree with Dr Koval's plan.   

## 2023-05-24 ENCOUNTER — Telehealth: Payer: Self-pay | Admitting: Family Medicine

## 2023-05-24 NOTE — Telephone Encounter (Signed)
Shipment given to patient.

## 2023-05-24 NOTE — Telephone Encounter (Signed)
Patient dropped off [employer] form to be completed. Last DOS was 04/24/23. Placed in [employer].

## 2023-05-27 NOTE — Telephone Encounter (Signed)
Placed in MDs box to be filled out. Andrew [name], CMA  

## 2023-05-28 ENCOUNTER — Telehealth: Payer: Self-pay

## 2023-05-28 NOTE — Progress Notes (Signed)
Pharmacy Medication Assistance Program Note    06/24/2023  Patient ID: Andrew [name], male   DOB: 05-Nov-1955, 67 y.o.   MRN: [id number]     05/28/2023  Outreach Medication One  Manufacturer Medication One Nurse, adult Drugs Farxiga  Dose of Farxiga 10MG   Type of Radiographer, therapeutic Assistance  Date Application Sent to Prescriber 05/28/2023  Name of Prescriber TODD MCDIARMID  Date Application Received From Patient 05/28/2023  Date Application Received From Provider 06/04/2023  Date Application Submitted to Manufacturer 06/04/2023  Method Application Sent to Manufacturer Fax  Patient Assistance Determination Approved  Approval End Date 07/08/2024         05/28/2023  Outreach Medication Two  Manufacturer Medication Two Novo Nordisk  Nordisk Drugs Ozempic  Dose of Ozempic 1MG  DOSE  Type of Radiographer, therapeutic Assistance  Date Application Sent to Prescriber 05/28/2023  Name of Prescriber TODD MCDIARMID  Date Application Received From Patient 05/28/2023  Date Application Received From Provider 06/04/2023  Method Application Sent to Manufacturer Fax  Date Application Submitted to Manufacturer 06/04/2023  Patient Assistance Determination Approved  Approval Start Date 06/20/2023

## 2023-05-29 ENCOUNTER — Other Ambulatory Visit (HOSPITAL_COMMUNITY): Payer: Self-pay

## 2023-06-03 ENCOUNTER — Ambulatory Visit: Payer: Medicare HMO | Admitting: Family Medicine

## 2023-06-03 NOTE — Progress Notes (Deleted)
    SUBJECTIVE:   CHIEF COMPLAINT / HPI: follow-up subacromial impingement?  ***  PERTINENT  PMH / PSH: HTN, T2DM, HLD, Hypothyroidism, CKD, Hx of CVA  OBJECTIVE:   There were no vitals taken for this visit.  ***  ASSESSMENT/PLAN:   Assessment & Plan  No follow-ups on file.  Celine Mans, MD [facility]

## 2023-06-04 ENCOUNTER — Other Ambulatory Visit: Payer: Self-pay

## 2023-06-04 ENCOUNTER — Other Ambulatory Visit (HOSPITAL_COMMUNITY): Payer: Self-pay

## 2023-06-04 ENCOUNTER — Ambulatory Visit: Payer: Medicare HMO | Admitting: Family Medicine

## 2023-06-04 VITALS — BP 122/56 | HR 90 | Wt 208.0 lb

## 2023-06-04 DIAGNOSIS — M545 Low back pain, unspecified: Secondary | ICD-10-CM

## 2023-06-04 DIAGNOSIS — I10 Essential (primary) hypertension: Secondary | ICD-10-CM

## 2023-06-04 DIAGNOSIS — E119 Type 2 diabetes mellitus without complications: Secondary | ICD-10-CM | POA: Diagnosis not present

## 2023-06-04 LAB — POCT GLYCOSYLATED HEMOGLOBIN (HGB A1C): HbA1c, POC (controlled diabetic range): 8.1 % — AB (ref 0.0–7.0)

## 2023-06-04 MED ORDER — LANTUS SOLOSTAR 100 UNIT/ML ~~LOC~~ SOPN: 22.0000 [IU] | PEN_INJECTOR | Freq: Every day | SUBCUTANEOUS | 99 refills | Status: DC

## 2023-06-04 MED ORDER — MELOXICAM 15 MG PO TABS: 15.0000 mg | ORAL_TABLET | Freq: Every day | ORAL | 0 refills | Status: DC | PRN

## 2023-06-04 MED ORDER — LOSARTAN POTASSIUM 100 MG PO TABS: 100.0000 mg | ORAL_TABLET | Freq: Every day | ORAL | 3 refills | Status: DC

## 2023-06-04 MED ORDER — INSULIN PEN NEEDLE 32G X 4 MM MISC: 1.0000 | 11 refills | Status: DC | PRN

## 2023-06-04 MED ORDER — FREESTYLE LIBRE 3 PLUS SENSOR MISC: 3 refills | Status: DC

## 2023-06-04 NOTE — Patient Instructions (Signed)
Good to see you today - Thank you for coming in  Things we discussed today:  1) Congratulations, your A1c continues to get better.  Today your A1c is 8.1.  Our goal is to get it under 7.  Keep up the good work! - Continue taking your metformin, lantus, and ozempic - I will send in a refill for your libre sensors  2) For your back pain, I suspect that you have strained the muscles of the back.  Glad to see that you are getting better. -I am providing a short course of meloxicam.  You can take 1 a day as needed for back pain.  Do not take more than 1 a day.

## 2023-06-04 NOTE — Telephone Encounter (Signed)
Form faxed to number provided.   Copy made for batch scanning.

## 2023-06-04 NOTE — Progress Notes (Signed)
    SUBJECTIVE:   CHIEF COMPLAINT / HPI:   SL is a 67yo F w/ hx of T2DM, HTN, hypothyroidism that p/f low back pain.  Low back pain - Having lower back pain for past 5 days - Having trouble sitting and walking due to the pain - Was taking "Doans" pills, unsure if it was helpful (looks like this is a formulation of OTC aspirin) - Pain improving.    T2DM - has been out of [place] for past month. Tried to get refill of libre 3, but was told he now needs libre 4 orders.  - Needs needles  - needs refill on lantus, CGM - Taking Ozempic (20 clicks weekly), metformin 1000 BID, lantus 22u daily   OBJECTIVE:   BP (!) 122/56   Pulse 90   Wt 208 lb (94.3 kg)   SpO2 98%   BMI 29.01 kg/m    General: Alert, pleasant man. NAD. HEENT: NCAT. MMM. CV: RRR, no murmurs.  Resp: CTAB, no wheezing or crackles. Normal WOB on RA.  Abm: Soft, nontender, nondistended. BS present. Ext: No tenderness of sacral spine and normal ROM of spine. Regular gait. Skin: Warm, well perfused    ASSESSMENT/PLAN:   Assessment & Plan Type 2 diabetes mellitus without complication, without long-term current use of insulin (HCC) A1c 8.1, improving but still uncontrolled.  Encouraged patient on his progress. - Refill provided for [facility] 3 plus (suspect that he couldn't get libre 3 due to back order) - Advised to f/u in 1 month to review CGM and adjust meds as needed - Refill for insulin needle - Cont metformin, ozempic, and lantus. Acute bilateral low back pain without sciatica Improving. Exam reassuring.  - Provided 1 week supply of meloxicam daily prn for back pain      Lincoln Brigham, MD [facility]

## 2023-06-05 ENCOUNTER — Other Ambulatory Visit (HOSPITAL_COMMUNITY): Payer: Self-pay

## 2023-06-05 NOTE — Assessment & Plan Note (Addendum)
A1c 8.1, improving but still uncontrolled.  Encouraged patient on his progress. - Refill provided for [facility] 3 plus (suspect that he couldn't get libre 3 due to back order) - Advised to f/u in m1 month to review CGM and adjust meds as needed - - Cont metformin, ozempic, and lantus.

## 2023-06-11 ENCOUNTER — Ambulatory Visit (INDEPENDENT_AMBULATORY_CARE_PROVIDER_SITE_OTHER): Payer: Medicare HMO | Admitting: Pharmacist

## 2023-06-11 ENCOUNTER — Other Ambulatory Visit (HOSPITAL_COMMUNITY): Payer: Self-pay

## 2023-06-11 ENCOUNTER — Encounter: Payer: Self-pay | Admitting: Pharmacist

## 2023-06-11 VITALS — BP 114/59 | HR 57 | Wt 209.4 lb

## 2023-06-11 DIAGNOSIS — E119 Type 2 diabetes mellitus without complications: Secondary | ICD-10-CM

## 2023-06-11 NOTE — Assessment & Plan Note (Signed)
Diabetes longstanding currently improved with latest A1c of 8.1%. Patient is able to verbalize appropriate hypoglycemia management plan. Medication adherence appears good. Control is improved with use of Lantus (insulin glargine) 24 units once daily in the AM and 2000mg  daily of metformin XR (4 of the 500mg  tablets daily). - Continued Lantus (insulin glargine) to 22 units once daily. - Continued 3 tablets of metformin XR (500 mg tablet) in the morning and 1 tablet at lunch.  - Increased Ozempic (semaglutide) - from 20 to 30 clicks once weekly.  -Patient educated on purpose, proper use, and potential adverse effects.  - GM replaced with sample - plans to use Libre 3 + CGM

## 2023-06-11 NOTE — Patient Instructions (Addendum)
It was nice to see you today!  Your goal blood sugar is 80-130 before eating and less than 180 after eating.  Medication Changes: Increase Ozempic (semaglutide) from 20 clicks to 30 clicks once weekly.   Continue all other medication the same.   Monitor blood sugars at home and keep a log (glucometer or piece of paper) to bring with you to your next visit.  Keep up the good work with diet and exercise. Aim for a diet full of vegetables, fruit and lean meats (chicken, [place], fish). Try to limit salt intake by eating fresh or frozen vegetables (instead of canned), rinse canned vegetables prior to cooking and do not add any additional salt to meals.

## 2023-06-11 NOTE — Progress Notes (Signed)
S:     Chief Complaint  Patient presents with   Medication Management    Diabetes - CGM   67 y.o. male who presents for diabetes evaluation, education, and management. Patient arrives in good spirits and presents without any assistance.   Patient was referred and last seen by Primary Care Provider, Dr. Sherrilee Gilles, on 06/04/2023.   PMH is significant for T2DM, stroke, CKD, HTN, HLD, gout. .   Current diabetes medications include: Ozempic (semaglutide) to 20 clicks of the 1mg  pen. Lantus (insulin glargine) at 24 units once daily, metformin 1000mg  BID  Current hypertension medications include: amlodipine 10mg , losartan 100mg  and clonidine 0.1mg  BID Current hyperlipidemia medications include:  rosuvastatin 20mg  daily  Patient reports adherence to taking all medications as prescribed.   Do you feel that your medications are working for you? yes Have you been experiencing any side effects to the medications prescribed? No Having issues with CGM sensor - has Libre 3 + ordered and in process from [employer].  Patient also reports that he has a letter requesting use of lower cost insulin than lantus (insunlin glargine).   Patient denies hypoglycemic events.  Reported home fasting glucose readings are likely improved as he is feeling well.   Patient denies nocturia (nighttime urination).    O:   Review of Systems  All other systems reviewed and are negative.   Physical Exam Vitals reviewed.  Constitutional:      Appearance: Normal appearance.  Pulmonary:     Effort: Pulmonary effort is normal.  Neurological:     Mental Status: He is alert.  Psychiatric:        Mood and Affect: Mood normal.        Behavior: Behavior normal.        Thought Content: Thought content normal.    Libre3 no recent data - lack of sensor access recently.    Lab Results  Component Value Date   HGBA1C 8.1 (A) 06/04/2023   Vitals:   06/11/23 0919  BP: (!) 114/59  Pulse: (!) 57  SpO2:  100%    Lipid Panel     Component Value Date/Time   CHOL 99 (L) 08/24/2022 1056   TRIG 122 08/24/2022 1056   HDL 48 08/24/2022 1056   CHOLHDL 2.1 08/24/2022 1056   CHOLHDL 2.5 10/26/2014 1118   VLDL 20 10/26/2014 1118   LDLCALC 29 08/24/2022 1056   LDLDIRECT 72 06/04/2012 0948    Clinical Atherosclerotic Cardiovascular Disease (ASCVD): Yes   Stroke  The ASCVD Risk score (Arnett DK, et al., 2019) failed to calculate for the following reasons:   The patient has a prior MI or stroke diagnosis    A/P: Diabetes longstanding currently improved with latest A1c of 8.1%. Patient is able to verbalize appropriate hypoglycemia management plan. Medication adherence appears good. Control is improved with use of Lantus (insulin glargine) 24 units once daily in the AM and 2000mg  daily of metformin XR (4 of the 500mg  tablets daily). - Continued Lantus (insulin glargine) to 22 units once daily.  (Appears that Lantus solostar is the preferred long-acting insulin glargine - covered status).  - Continued 3 tablets of metformin XR (500 mg tablet) in the morning and 1 tablet at lunch.  - Increased Ozempic (semaglutide) - from 20 to 30 clicks once weekly.  -Patient educated on purpose, proper use, and potential adverse effects.  - GM replaced with sample - plans to use Libre 3 + CGM -Extensively discussed pathophysiology of diabetes, recommended  lifestyle interventions, dietary effects on blood sugar control.  -Counseled on s/sx of and management of hypoglycemia.     Written patient instructions provided. Patient verbalized understanding of treatment plan.  Total time in face to face counseling 22 minutes.    Follow-up:  Pharmacist 1 month PCP clinic visit in PRN Patient seen with Dr. Cyndia Skeeters, DO

## 2023-06-12 NOTE — Progress Notes (Signed)
Reviewed and agree with Dr Koval's plan.   

## 2023-06-13 ENCOUNTER — Telehealth: Payer: Self-pay

## 2023-06-13 NOTE — Telephone Encounter (Signed)
Patient LVM on nurse line requesting a refill on pen needles.   Per chart review, pen needles were sent to [facility] on 11/26.  I called [facility] and they report the needles will ship out tomorrow and should arrive at this home in 3-4 business days.   I attempted to call patient back to discuss, however no answer.

## 2023-06-24 ENCOUNTER — Other Ambulatory Visit: Payer: Self-pay | Admitting: *Deleted

## 2023-06-24 ENCOUNTER — Ambulatory Visit (INDEPENDENT_AMBULATORY_CARE_PROVIDER_SITE_OTHER): Payer: Medicare HMO | Admitting: Family Medicine

## 2023-06-24 VITALS — BP 131/80 | HR 83 | Wt 210.4 lb

## 2023-06-24 DIAGNOSIS — Z7189 Other specified counseling: Secondary | ICD-10-CM | POA: Diagnosis not present

## 2023-06-24 DIAGNOSIS — E119 Type 2 diabetes mellitus without complications: Secondary | ICD-10-CM | POA: Diagnosis not present

## 2023-06-24 NOTE — Patient Instructions (Signed)
Good to see you today - Thank you for coming in  Things we discussed today:  1) We will check your blood and urine labs to evaluate your kidneys. We will check this once a year.   2) make sure to check your feet regularly for wounds and infections.  If you notice any infection or wound that is not healing after 5 days, please come in to get evaluated.  Uncontrolled infections can lead to amputation.

## 2023-06-24 NOTE — Progress Notes (Signed)
    SUBJECTIVE:   CHIEF COMPLAINT / HPI:   Andrew [name] is a 67yo M w/ hx of T2DM that p/f T2DM management.   T2DM - reports taking meds consistently - He has a CGM from his visit with Koval, but reports that he has not gotten the order from his pharmacy yet. He plans to call them to recheck. -His insurance is requesting that he have a "kidney evaluation".   OBJECTIVE:   BP 131/80   Pulse 83   Wt 210 lb 6.4 oz (95.4 kg)   SpO2 97%   BMI 29.34 kg/m   General: Alert, pleasant man. NAD. HEENT: NCAT. MMM. CV: RRR, no murmurs.  Resp: CTAB, no wheezing or crackles. Normal WOB on RA.  Abm: Soft, nontender, nondistended. BS present. Ext: Moves all ext spontaneously Skin: Warm, well perfused   ASSESSMENT/PLAN:   Assessment & Plan Type 2 diabetes mellitus without complication, without long-term current use of insulin (HCC) Due for kidney evaluation, will check creatinine and UACR today.  Will have patient call pharmacy to get refill of his CGM's. -Foot exam documented under screening tab.  Reviewed foot health and examining for wounds. Goals of care, counseling/discussion Provided copy of advance directives form.  Patient reports that his son knows his wishes and would make his medical decisions for him, I advised that he should review this form with his son and anyone else who would be involved in his medical decision making.  Advised patient to fill out form and bring back to clinic to get it notarized and scanned into epic.    Lincoln Brigham, MD [facility]

## 2023-06-25 LAB — BASIC METABOLIC PANEL
BUN/Creatinine Ratio: 9 — ABNORMAL LOW (ref 10–24)
BUN: 13 mg/dL (ref 8–27)
CO2: 21 mmol/L (ref 20–29)
Calcium: 9.5 mg/dL (ref 8.6–10.2)
Chloride: 101 mmol/L (ref 96–106)
Creatinine, Ser: 1.37 mg/dL — ABNORMAL HIGH (ref 0.76–1.27)
Glucose: 182 mg/dL — ABNORMAL HIGH (ref 70–99)
Potassium: 4.2 mmol/L (ref 3.5–5.2)
Sodium: 138 mmol/L (ref 134–144)
eGFR: 57 mL/min/{1.73_m2} — ABNORMAL LOW (ref >59)

## 2023-06-25 LAB — MICROALBUMIN / CREATININE URINE RATIO
Creatinine, Urine: 58.4 mg/dL
Microalb/Creat Ratio: 15 mg/g{creat} (ref 0–29)
Microalbumin, Urine: 8.8 ug/mL

## 2023-06-27 MED ORDER — ALLOPURINOL 100 MG PO TABS: 200.0000 mg | ORAL_TABLET | Freq: Every day | ORAL | 3 refills | Status: DC

## 2023-06-27 NOTE — Assessment & Plan Note (Signed)
Due for kidney evaluation, will check creatinine and UACR today.  Will have patient call pharmacy to get refill of his CGM's. -Foot exam documented under screening tab.  Reviewed foot health and examining for wounds.

## 2023-07-05 ENCOUNTER — Other Ambulatory Visit: Payer: Self-pay

## 2023-07-05 MED ORDER — METFORMIN HCL ER 500 MG PO TB24: 1000.0000 mg | ORAL_TABLET | Freq: Two times a day (BID) | ORAL | 2 refills | Status: DC

## 2023-07-08 ENCOUNTER — Telehealth: Payer: Self-pay

## 2023-07-15 ENCOUNTER — Other Ambulatory Visit: Payer: Self-pay

## 2023-07-15 DIAGNOSIS — I10 Essential (primary) hypertension: Secondary | ICD-10-CM

## 2023-07-15 MED ORDER — CLONIDINE HCL 0.1 MG PO TABS: 0.1000 mg | ORAL_TABLET | Freq: Two times a day (BID) | ORAL | 3 refills | Status: DC

## 2023-07-15 MED ORDER — ACCU-CHEK GUIDE W/DEVICE KIT: PACK | 0 refills | Status: DC

## 2023-07-15 MED ORDER — METFORMIN HCL ER 500 MG PO TB24: 1000.0000 mg | ORAL_TABLET | Freq: Two times a day (BID) | ORAL | 2 refills | Status: DC

## 2023-07-15 MED ORDER — ALLOPURINOL 100 MG PO TABS: 200.0000 mg | ORAL_TABLET | Freq: Every day | ORAL | 3 refills | Status: DC

## 2023-07-15 MED ORDER — ROSUVASTATIN CALCIUM 20 MG PO TABS: 20.0000 mg | ORAL_TABLET | Freq: Every day | ORAL | 3 refills | Status: DC

## 2023-07-15 MED ORDER — AMLODIPINE BESYLATE 10 MG PO TABS: 10.0000 mg | ORAL_TABLET | Freq: Every day | ORAL | 2 refills | Status: DC

## 2023-07-22 ENCOUNTER — Ambulatory Visit (INDEPENDENT_AMBULATORY_CARE_PROVIDER_SITE_OTHER): Payer: Medicare HMO | Admitting: Pharmacist

## 2023-07-22 ENCOUNTER — Encounter: Payer: Self-pay | Admitting: Pharmacist

## 2023-07-22 VITALS — BP 122/64 | HR 68 | Wt 216.0 lb

## 2023-07-22 DIAGNOSIS — E119 Type 2 diabetes mellitus without complications: Secondary | ICD-10-CM | POA: Diagnosis not present

## 2023-07-22 DIAGNOSIS — M545 Low back pain, unspecified: Secondary | ICD-10-CM | POA: Diagnosis not present

## 2023-07-22 DIAGNOSIS — Z794 Long term (current) use of insulin: Secondary | ICD-10-CM

## 2023-07-22 MED ORDER — DAPAGLIFLOZIN PROPANEDIOL 10 MG PO TABS: 10.0000 mg | ORAL_TABLET | Freq: Every day | ORAL | 3 refills | Status: DC

## 2023-07-22 MED ORDER — MELOXICAM 15 MG PO TABS: 15.0000 mg | ORAL_TABLET | Freq: Every day | ORAL | 1 refills | Status: DC | PRN

## 2023-07-22 NOTE — Progress Notes (Signed)
    S:     Chief Complaint  Patient presents with   Medication Management    Diabetes   68 y.o. male who presents for diabetes evaluation, education, and management. Patient arrives in good spirits and presents without any assistance.   Patient was referred and last seen by Primary Care Provider, Dr. Elicia on 06/24/2023.   PMH is significant for T2DM, stroke, CKD, HTN, HLD, gout.    Current diabetes medications include:  Lantus  (insulin  glargine) at 24 units once daily, metformin  1000mg  BID, Farxiga  (dapagliflozin ) 10mg  daily.  Patient denies adherence with medications, reports missing Ozempic  (semaglutide ) medications (stopped taking).   Do you feel that your medications are working for you? yes Have you been experiencing any side effects to the medications prescribed? no Do you have any problems obtaining medications due to transportation or finances? no Insurance coverage: [facility] Medicare  Patient denies hypoglycemic events.  O:   Review of Systems  Constitutional:  Positive for malaise/fatigue.  Musculoskeletal:  Positive for joint pain (toe wound).  Neurological:  Positive for dizziness.  All other systems reviewed and are negative.  Toe wound is healing per patient - Seeing PCP- Elicia 1/14 (tomorrow)   Physical Exam Vitals reviewed.  Constitutional:      Appearance: Normal appearance.  Neurological:     Mental Status: He is alert.  Psychiatric:        Mood and Affect: Mood normal.        Behavior: Behavior normal.    Libre3 CGM Download today 07/09/2023 % Time CGM is active: 96% Average Glucose: 236 mg/dL Glucose Management Indicator: 9%  Glucose Variability: 14.7% (goal <36%) Time in Goal:  - Time in range 70-180: 5% - Time above range: 95% - Time below range: 0%  Lab Results  Component Value Date   HGBA1C 8.1 (A) 06/04/2023   Vitals:   07/22/23 1008  BP: 122/64  Pulse: 68  SpO2: 100%    Lipid Panel     Component Value Date/Time   CHOL 99  (L) 08/24/2022 1056   TRIG 122 08/24/2022 1056   HDL 48 08/24/2022 1056   CHOLHDL 2.1 08/24/2022 1056   CHOLHDL 2.5 10/26/2014 1118   VLDL 20 10/26/2014 1118   LDLCALC 29 08/24/2022 1056   LDLDIRECT 72 06/04/2012 0948    Clinical Atherosclerotic Cardiovascular Disease (ASCVD): Yes  Stroke  The ASCVD Risk score (Arnett DK, et al., 2019) failed to calculate for the following reasons:   The patient has a prior MI or stroke diagnosis   A/P: Diabetes longstanding recently worsened controlled due to non-adherence of Ozempic  (semaglutide ). Patient is able to verbalize appropriate hypoglycemia management plan. Medication adherence appears good. -Continued basal insulin  Lantus  (insulin  glargine) 24 units daily in the morning.  -Restarted GLP-1 Ozempic  (semaglutide ) 20 clicks (0.25 mg) this week then 30 clicks (0.4 mg-0.5 mg) starting next week -Continued SGLT2-I Farxiga  (dapagliflozin ) 10 mg 1 tablet by mouth daily -Continued metformin  1000 mg -Patient educated on purpose, proper use, and potential adverse effects.   -Counseled on s/sx of and management of hypoglycemia.    Written patient instructions provided. Patient verbalized understanding of treatment plan.  Total time in face to face counseling 28 minutes.    Follow-up:  Pharmacist on 08/12/2023 with Dr. Amalia PCP clinic visit in 07/23/2023 with Dr. Elicia Patient seen with Owens Cowing, PharmD Candidate and Madelaine Darnel, PharmD Candidate.

## 2023-07-22 NOTE — Patient Instructions (Signed)
 It was nice to see you today!    Your goal blood sugar is 80-130 before eating and less than 180 after eating.  Medication Changes: RESTART - Ozempic  - Take 20 clicks this week THEN increase to 30 clicks once weekly  Continue all other medication the same.   Keep up the good work with diet and exercise. Aim for a diet full of vegetables, fruit and lean meats (chicken, turkey, fish). Try to limit salt intake by eating fresh or frozen vegetables (instead of canned), rinse canned vegetables prior to cooking and do not add any additional salt to meals.

## 2023-07-22 NOTE — Assessment & Plan Note (Signed)
 Diabetes longstanding recently worsened controlled due to non-adherence of Ozempic  (semaglutide ). Patient is able to verbalize appropriate hypoglycemia management plan. Medication adherence appears good. -Continued basal insulin  Lantus  (insulin  glargine) 24 units daily in the morning.  -Restarted GLP-1 Ozempic  (semaglutide ) 20 clicks (0.25 mg) this week then 30 clicks (0.4 mg-0.5 mg) starting next week -Continued SGLT2-I Farxiga  (dapagliflozin ) 10 mg 1 tablet by mouth daily -Continued metformin  1000 mg -Patient educated on purpose, proper use, and potential adverse effects.   -Counseled on s/sx of and management of hypoglycemia.

## 2023-07-23 ENCOUNTER — Encounter: Payer: Self-pay | Admitting: Family Medicine

## 2023-07-23 ENCOUNTER — Ambulatory Visit (INDEPENDENT_AMBULATORY_CARE_PROVIDER_SITE_OTHER): Payer: Self-pay | Admitting: Family Medicine

## 2023-07-23 VITALS — BP 139/75 | HR 76 | Ht 71.0 in | Wt 216.1 lb

## 2023-07-23 DIAGNOSIS — L089 Local infection of the skin and subcutaneous tissue, unspecified: Secondary | ICD-10-CM

## 2023-07-23 DIAGNOSIS — M5430 Sciatica, unspecified side: Secondary | ICD-10-CM | POA: Diagnosis not present

## 2023-07-23 MED ORDER — GABAPENTIN 100 MG PO CAPS: 100.0000 mg | ORAL_CAPSULE | Freq: Three times a day (TID) | ORAL | 3 refills | Status: DC | PRN

## 2023-07-23 NOTE — Patient Instructions (Signed)
 Good to see you today - Thank you for coming in  Things we discussed today:  1) Your toe wound seems to be healing well.  - Continue to keep the wound clean.  You can wash it gently daily with soap and water.  Apply a layer of Neosporin to keep the wound clean and protected -Try to keep socks and shoes off as much as possible while you are walking during the day to prevent the wound being overly moist -With come back to see me if you notice a wound getting more painful, draining pus, swelling in size   2) for your back and sciatica pain: -You can take gabapentin  100 mg up to 3 times a day.  If that is not enough you can take 200 mg for your nighttime dose.  This medication may make you more drowsy. -You can take Tylenol  3 times a day with the gabapentin  as well.  If your pain is not improved after 1 month, come back and see me.

## 2023-07-23 NOTE — Progress Notes (Signed)
    SUBJECTIVE:   CHIEF COMPLAINT / HPI:   SL is a 68 yo M w/ hx of T2DM, chronic back pain w/ sciatica that p/f foot wound and sciatica.   L sciatica - L sided sciatica is flaring up due to the cold weather - Has chronic L lower back pain and shoots down his L leg - Taking gabapentin  and tylenol  together.  Patient is only taking gabapentin  and Tylenol  once a day.  He was unaware that he could take it more often -Not taking NSAIDs  L toe wound - For over 1 month, stumped his L big toe and cut it on furniture - No blood, no puss anymore - Not warm to touch - Only hurt with shoes on, but no pain otherwise - Has been putting Neosporin on it - Trying to air it out more and avoid wearing shoes around the house -Was worried that it was healing slowly so he wanted to get it checked out since he knows he has diabetes.   OBJECTIVE:   There were no vitals taken for this visit.  General: Alert, pleasant man. NAD. HEENT: NCAT. MMM. CV: RRR, no murmurs.  Resp: CTAB, no wheezing or crackles. Normal WOB on RA.  Ext: Moves all ext spontaneously Skin: L foot wound on medial aspect of R toe base, firm thickened hyperpigmented skin, no drainage or bleeding, no fluctuance or tenderness or redness.    ASSESSMENT/PLAN:   Assessment & Plan Sciatica, unspecified laterality Gradual worsening of chronic lower back pain with left-sided sciatica.  No red flag symptoms such as lower extremity weakness or incontinence. -Take gabapentin  100 mg 3 times a day as needed for pain.  Can increase to 200 mg dose at bedtime if needed. -Take Tylenol  3 times daily as needed for pain as well. -Follow-up in 1 month if pain not improved.  He has room to go up on his gabapentin  and Tylenol  if needed. Skin infection Appears to be healing well with residual postinflammatory hyperpigmentation and scar formation.  Reassuringly, no drainage, pain, fluctuance which suggest that wound is not actively infected and is  healing.  Antibiotics are not indicated at this time.  Advised to continue daily wound care including Neosporin daily.  Return precautions discussed.    Twyla Nearing, MD [facility]

## 2023-07-24 NOTE — Progress Notes (Signed)
 Reviewed and agree with Dr Rennis plan.

## 2023-07-25 ENCOUNTER — Other Ambulatory Visit: Payer: Self-pay | Admitting: *Deleted

## 2023-07-25 NOTE — Telephone Encounter (Signed)
Pt needs lancets, test strips and swabs. Trista Ciocca Bruna Potter, CMA

## 2023-07-29 MED ORDER — ACCU-CHEK SOFTCLIX LANCETS MISC: 2 refills | Status: DC

## 2023-08-02 NOTE — Telephone Encounter (Signed)
Left message to follow up about [employer] shipment that's ready at the office. Also asked if patient rec'd a renewal application from [employer].   Requested call back or message back via mychart.

## 2023-08-06 ENCOUNTER — Telehealth: Payer: Self-pay

## 2023-08-06 NOTE — Telephone Encounter (Signed)
Patient calls nurse line in regards to Lantus.   He reports he received a letter from [facility] stating they would no longer pay for this medication.   He reports they gave him "what looks like" a 3 month supply.   He reports he wants Dr. Raymondo Band to know this for next insulin fill.   He reports they did not give any alternatives in the letter.   Advised will forward to pharmacy team.

## 2023-08-12 ENCOUNTER — Encounter: Payer: Self-pay | Admitting: Pharmacist

## 2023-08-12 ENCOUNTER — Ambulatory Visit (INDEPENDENT_AMBULATORY_CARE_PROVIDER_SITE_OTHER): Payer: Medicare HMO | Admitting: Pharmacist

## 2023-08-12 VITALS — BP 124/63 | HR 76 | Wt 211.0 lb

## 2023-08-12 DIAGNOSIS — E119 Type 2 diabetes mellitus without complications: Secondary | ICD-10-CM | POA: Diagnosis not present

## 2023-08-12 DIAGNOSIS — M1A09X0 Idiopathic chronic gout, multiple sites, without tophus (tophi): Secondary | ICD-10-CM

## 2023-08-12 MED ORDER — FREESTYLE LIBRE 3 PLUS SENSOR MISC: 11 refills | Status: DC

## 2023-08-12 MED ORDER — ALLOPURINOL 100 MG PO TABS: 100.0000 mg | ORAL_TABLET | Freq: Every day | ORAL | 3 refills | Status: DC

## 2023-08-12 NOTE — Progress Notes (Signed)
 Reviewed and agree with Dr Macky Lower plan.

## 2023-08-12 NOTE — Assessment & Plan Note (Signed)
Gout longstanding - with intermittent flares managed with allopurinol PRN and meloxicam.  Discussed allopurinol as a preventive medicine rather than PRN flare.  Patient agreed to take daily.  - Restarted allopurinol 100 mg once daily

## 2023-08-12 NOTE — Assessment & Plan Note (Signed)
Diabetes longstanding currently improved after restarted on Ozempic (semaglutide). Patient is able to verbalize appropriate hypoglycemia management plan. Medication adherence appears good with some medications taking only as needed. Control is suboptimal due to low dose of Ozempic -Continued basal insulin Semglee (insulin glargine) 24 units daily in the morning -Increased dose of GLP-1  Ozempic (semaglutide) from 20 clicks to 30 clicks   -Continued SGLT2-I Farxiga (dapagliflozin)  -Continued metformin 500 mg. Patient is taking 1 tablet once daily - patient willing to take 1 tablet daily.  -Extensively discussed pathophysiology of diabetes, recommended lifestyle interventions, dietary effects on blood sugar control.  -Counseled on s/sx of and management of hypoglycemia.

## 2023-08-12 NOTE — Progress Notes (Signed)
S:     Chief Complaint  Patient presents with   Medication Management    DM F/U   68 y.o. male who presents for diabetes evaluation, education, and management. Patient arrives in good spirits and presents without any assistance.  Patient was referred and last seen by Primary Care Provider, Dr. Sherrilee Gilles, on 07/23/2023.   PMH is significant for T2DM, stroke, CKD, HTN, HLD, gout.  .  At last visit, plan was to take GLP-1 Ozempic (semaglutide) 20 clicks (0.25 mg) X1 week then 30 clicks (0.4 mg-0.5 mg) starting 2nd week.   Current diabetes medications include:  Lantus (insulin glargine) at 24 units once daily, metformin 1000 mg BID (taking only 1 X 500mg  daily), Farxiga (dapagliflozin) 10mg  daily, Ozempic (semaglutide) 20 clicks weekly Current hypertension medications include: amlodipine 10 mg once daily, losartan 100 mg once daily,  Current hyperlipidemia medications include: rosuvastatin 20 mg once daily  Patient denies adherence with medications, reports taking some medications only as need (allopurinol and gabapentin), and metformin 500 mg 1 tablet once daily  Do you feel that your medications are working for you? yes Have you been experiencing any side effects to the medications prescribed? no Do you have any problems obtaining medications due to transportation or finances? no Insurance coverage: [employer]  Patient denies hypoglycemic events.  O:   Review of Systems  All other systems reviewed and are negative.   Physical Exam Constitutional:      Appearance: Normal appearance.  Pulmonary:     Effort: Pulmonary effort is normal.  Neurological:     Mental Status: He is alert.  Psychiatric:        Mood and Affect: Mood normal.        Behavior: Behavior normal.        Thought Content: Thought content normal.    Libre3 CGM Download today 08/12/2023 % Time CGM is active: 96% Average Glucose: 170 mg/dL Glucose Management Indicator: 7.4  Glucose Variability: 16.6%  (goal <36%) Time in Goal:  - Time in range 70-180: 66% - Time above range: 34% - Time below range: 0%   Lab Results  Component Value Date   HGBA1C 8.1 (A) 06/04/2023   Vitals:   08/12/23 1010  BP: 124/63  Pulse: 76  SpO2: 100%    Lipid Panel     Component Value Date/Time   CHOL 99 (L) 08/24/2022 1056   TRIG 122 08/24/2022 1056   HDL 48 08/24/2022 1056   CHOLHDL 2.1 08/24/2022 1056   CHOLHDL 2.5 10/26/2014 1118   VLDL 20 10/26/2014 1118   LDLCALC 29 08/24/2022 1056   LDLDIRECT 72 06/04/2012 0948    Clinical Atherosclerotic Cardiovascular Disease (ASCVD): Yes  The ASCVD Risk score (Arnett DK, et al., 2019) failed to calculate for the following reasons:   Risk score cannot be calculated because patient has a medical history suggesting prior/existing ASCVD   A/P: Diabetes longstanding currently improved after restarted on Ozempic (semaglutide). Patient is able to verbalize appropriate hypoglycemia management plan. Medication adherence appears good with some medications taking only as needed. Control is suboptimal due to low dose of Ozempic -Continued basal insulin Semglee (insulin glargine) 24 units daily in the morning -Increased dose of GLP-1  Ozempic (semaglutide) from 20 clicks to 30 clicks   -Continued SGLT2-I Farxiga (dapagliflozin)  -Continued metformin 500 mg. Patient is taking 1 tablet once daily - patient willing to take 1 tablet daily.  -Extensively discussed pathophysiology of diabetes, recommended lifestyle interventions, dietary effects on  blood sugar control.  -Counseled on s/sx of and management of hypoglycemia.   Gout longstanding - with intermittent flares managed with allopurinol PRN and meloxicam.  Discussed allopurinol as a preventive medicine rather than PRN flare.  Patient agreed to take daily.  - Restarted allopurinol 100 mg once daily  Written patient instructions provided. Patient verbalized understanding of treatment plan.  Total time in face to  face counseling 23 minutes.    Follow-up:  Pharmacist 03/24 with Dr. Raymondo Band PCP clinic visit in PRN Patient seen with Lavona Mound, PharmD Candidate and Pearletha Forge, PharmD Candidate.

## 2023-08-12 NOTE — Patient Instructions (Signed)
It was nice to see you today!  Your goal blood sugar is 80-130 before eating and less than 180 after eating.  Medication Changes:  Increase Ozempic (semaglutide) from 20 clicks weekly to 30 clicks weekly  Start allopurinol 100 mg once daily  Continue all other medication the same.   Keep up the good work with diet and exercise. Aim for a diet full of vegetables, fruit and lean meats (chicken, [place], fish). Try to limit salt intake by eating fresh or frozen vegetables (instead of canned), rinse canned vegetables prior to cooking and do not add any additional salt to meals.

## 2023-08-27 ENCOUNTER — Other Ambulatory Visit: Payer: Self-pay

## 2023-08-27 ENCOUNTER — Other Ambulatory Visit: Payer: Self-pay | Admitting: *Deleted

## 2023-08-27 DIAGNOSIS — I10 Essential (primary) hypertension: Secondary | ICD-10-CM

## 2023-08-27 DIAGNOSIS — E119 Type 2 diabetes mellitus without complications: Secondary | ICD-10-CM

## 2023-08-27 DIAGNOSIS — E039 Hypothyroidism, unspecified: Secondary | ICD-10-CM

## 2023-08-27 MED ORDER — LEVOTHYROXINE SODIUM 75 MCG PO TABS: 75.0000 ug | ORAL_TABLET | Freq: Every day | ORAL | 3 refills | Status: DC

## 2023-08-27 MED ORDER — FREESTYLE LIBRE 3 PLUS SENSOR MISC: 11 refills | Status: DC

## 2023-08-27 MED ORDER — ROSUVASTATIN CALCIUM 20 MG PO TABS: 20.0000 mg | ORAL_TABLET | Freq: Every day | ORAL | 3 refills | Status: DC

## 2023-08-27 MED ORDER — AMLODIPINE BESYLATE 10 MG PO TABS: 10.0000 mg | ORAL_TABLET | Freq: Every day | ORAL | 2 refills | Status: DC

## 2023-08-27 MED ORDER — MELOXICAM 15 MG PO TABS: 15.0000 mg | ORAL_TABLET | Freq: Every day | ORAL | 3 refills | Status: DC

## 2023-08-27 MED ORDER — SEMAGLUTIDE (1 MG/DOSE) 4 MG/3ML ~~LOC~~ SOPN: 1.0000 mg | PEN_INJECTOR | SUBCUTANEOUS | Status: DC

## 2023-08-27 MED ORDER — CLONIDINE HCL 0.1 MG PO TABS: 0.1000 mg | ORAL_TABLET | Freq: Two times a day (BID) | ORAL | 3 refills | Status: DC

## 2023-08-27 NOTE — Telephone Encounter (Signed)
Patient calls nurse line regarding Freestyle Libre 3 sensors. He reports that he had a change in his insurance and sensors need to be sent to [facility].   Cancelled at [facility]. Resent to [facility].   Veronda Prude, RN

## 2023-08-27 NOTE — Telephone Encounter (Signed)
Another shipment of Farxiga from [employer] arrived to office 08/2023.  Patient has 2 order ready for pickup.

## 2023-08-29 NOTE — Telephone Encounter (Signed)
error 

## 2023-09-05 ENCOUNTER — Telehealth: Payer: Self-pay

## 2023-09-05 NOTE — Telephone Encounter (Signed)
 Patient calls nurse line in regards to Allopurinol prescription.   He reports his insurance no longer allows him to get medication from [facility].   He reports he has to use [facility].   Please send in Allopurinol to [facility].   His account is closed with [facility].

## 2023-09-06 ENCOUNTER — Other Ambulatory Visit: Payer: Self-pay

## 2023-09-09 MED ORDER — ALLOPURINOL 100 MG PO TABS: 100.0000 mg | ORAL_TABLET | Freq: Every day | ORAL | 3 refills | Status: DC

## 2023-09-12 ENCOUNTER — Other Ambulatory Visit: Payer: Self-pay

## 2023-09-12 DIAGNOSIS — I10 Essential (primary) hypertension: Secondary | ICD-10-CM

## 2023-09-12 DIAGNOSIS — N401 Enlarged prostate with lower urinary tract symptoms: Secondary | ICD-10-CM

## 2023-09-12 DIAGNOSIS — M5430 Sciatica, unspecified side: Secondary | ICD-10-CM

## 2023-09-13 ENCOUNTER — Other Ambulatory Visit: Payer: Self-pay

## 2023-09-13 ENCOUNTER — Other Ambulatory Visit: Payer: Self-pay | Admitting: Family Medicine

## 2023-09-13 DIAGNOSIS — E119 Type 2 diabetes mellitus without complications: Secondary | ICD-10-CM

## 2023-09-13 MED ORDER — LOSARTAN POTASSIUM 100 MG PO TABS: 100.0000 mg | ORAL_TABLET | Freq: Every day | ORAL | 3 refills | Status: DC

## 2023-09-13 MED ORDER — GABAPENTIN 100 MG PO CAPS: 100.0000 mg | ORAL_CAPSULE | Freq: Three times a day (TID) | ORAL | 3 refills | Status: DC | PRN

## 2023-09-13 MED ORDER — INSULIN PEN NEEDLE 32G X 4 MM MISC: 1.0000 | 11 refills | Status: AC | PRN

## 2023-09-13 MED ORDER — ALLOPURINOL 100 MG PO TABS: 100.0000 mg | ORAL_TABLET | Freq: Every day | ORAL | 3 refills | Status: DC

## 2023-09-13 MED ORDER — TAMSULOSIN HCL 0.4 MG PO CAPS: 0.8000 mg | ORAL_CAPSULE | Freq: Every day | ORAL | 3 refills | Status: DC

## 2023-09-13 NOTE — Telephone Encounter (Signed)
 Patient no longer using [facility].  Please send to [facility] on [address].

## 2023-09-18 ENCOUNTER — Ambulatory Visit: Payer: Medicare Other | Admitting: Podiatry

## 2023-09-18 DIAGNOSIS — M19072 Primary osteoarthritis, left ankle and foot: Secondary | ICD-10-CM

## 2023-09-18 MED ORDER — TRAMADOL HCL 50 MG PO TABS: 50.0000 mg | ORAL_TABLET | Freq: Three times a day (TID) | ORAL | 0 refills | Status: AC | PRN

## 2023-09-18 NOTE — Progress Notes (Signed)
 Subjective:  Patient ID: Andrew [name], male    DOB: [birth year],  MRN: [id number]  Chief Complaint  Patient presents with   Gout    Pt stated that his left great toe is bothering he stated that it is gout     68 y.o. male presents with the above complaint.  Patient presents with a follow-up of left first metatarsophalangeal joint pain.  Patient states doing well but his big toe started to hurt again.  Is been a while since he received a steroid injection he would like another 1 he denies any other acute complaints  Review of Systems: Negative except as noted in the HPI. Denies N/V/F/Ch.  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   Qualifier: Diagnosis of  By: Tomasa Rand     ERECTILE DYSFUNCTION 10/18/2006   Qualifier: Diagnosis of  By: Tomasa Rand     HAMMER TOE 07/25/2010   HLD (hyperlipidemia)    HTN (hypertension)    Hypothyroidism    Leg swelling    Lipoma of back 03/18/2011   Personal history of colonic adenomas 04/01/2013   Primary osteoarthritis of right knee 05/21/2018   Prostatitis    hx of x2   PSEUDOFOLLICULITIS BARBAE 03/10/2008   Qualifier: Diagnosis of  By: Sharen Hones  MD, Wynona Canes     Sarcoidosis    Status post right knee replacement 05/15/2017   T2DM (type 2 diabetes mellitus) (HCC)     Current Outpatient Medications:    Accu-Chek Softclix Lancets lancets, USE TO TEST BLOOD SUGAR 5 TIMES  DAILY, Disp: 500 each, Rfl: 2   Acetaminophen (TYLENOL ARTHRITIS EXT RELIEF PO), Take 650 mg by mouth 1 day or 1 dose., Disp: , Rfl:    allopurinol (ZYLOPRIM) 100 MG tablet, Take 1 tablet (100 mg total) by mouth daily., Disp: 90 tablet, Rfl:  3   amLODipine (NORVASC) 10 MG tablet, Take 1 tablet (10 mg total) by mouth daily., Disp: 100 tablet, Rfl: 2   aspirin EC 81 MG tablet, Take 1 tablet (81 mg total) by mouth daily., Disp: 90 tablet, Rfl: 2   Blood Glucose Monitoring Suppl (ACCU-CHEK GUIDE) w/Device KIT, Use to check blood sugar 3x per day. E11.9, Disp: 1 kit, Rfl: 0   cloNIDine (CATAPRES) 0.1 MG tablet, Take 1 tablet (0.1 mg total) by mouth 2 (two) times daily., Disp: 180 tablet, Rfl: 3   Continuous Glucose Sensor (FREESTYLE LIBRE 3 PLUS SENSOR) MISC, Change sensor every 15 days., Disp: 2 each, Rfl: 11   dapagliflozin propanediol (FARXIGA) 10 MG TABS tablet, Take 1 tablet (10 mg total) by mouth daily., Disp: 90 tablet, Rfl: 3   fluticasone (FLONASE) 50 MCG/ACT nasal spray, SPRAY 2 SPRAYS INTO EACH NOSTRIL EVERY DAY, Disp: 48 mL, Rfl: 2   gabapentin (NEURONTIN) 100 MG capsule, Take 1 capsule (100 mg total) by mouth 3 (three) times daily as needed., Disp: 90 capsule, Rfl: 3   glucose blood (ACCU-CHEK GUIDE) test strip, USE TO CHECK BLOOD SUGAR 5 TIMES DAILY, Disp: 500 strip, Rfl: 2   insulin glargine-yfgn (SEMGLEE) 100 UNIT/ML Pen, Inject 24 Units into the skin daily., Disp: , Rfl:  Insulin Pen Needle 32G X 4 MM MISC, 1 Container by Does not apply route as needed., Disp: 100 each, Rfl: 11   latanoprost (XALATAN) 0.005 % ophthalmic solution, Place 1 drop into both eyes at bedtime., Disp: , Rfl:    levothyroxine (SYNTHROID) 75 MCG tablet, Take 1 tablet (75 mcg total) by mouth daily before breakfast. Take 1 tablet (75 mcg total) by mouth daily before breakfast, Disp: 90 tablet, Rfl: 3   losartan (COZAAR) 100 MG tablet, Take 1 tablet (100 mg total) by mouth daily., Disp: 90 tablet, Rfl: 3   meloxicam (MOBIC) 15 MG tablet, Take 1 tablet (15 mg total) by mouth daily., Disp: 30 tablet, Rfl: 3   metFORMIN (GLUCOPHAGE-XR) 500 MG 24 hr tablet, Take 2 tablets (1,000 mg total) by mouth 2 (two) times daily., Disp: 400 tablet, Rfl: 2    rosuvastatin (CRESTOR) 20 MG tablet, Take 1 tablet (20 mg total) by mouth daily., Disp: 90 tablet, Rfl: 3   Semaglutide, 1 MG/DOSE, 4 MG/3ML SOPN, Inject 1 mg into the skin once a week. Inject 20 clicks once weekly, Disp: , Rfl:    tamsulosin (FLOMAX) 0.4 MG CAPS capsule, Take 2 capsules (0.8 mg total) by mouth daily., Disp: 180 capsule, Rfl: 3   Turmeric POWD, Take 5 mLs by mouth daily. Mixes 1 teaspoon in water every day and drinks it. Uses for arthritis., Disp: , Rfl:   Social History   Tobacco Use  Smoking Status Never  Smokeless Tobacco Never  Tobacco Comments   Never smoker; no plans to start    Allergies  Allergen Reactions   Metoprolol Succinate Other (See Comments)    Bradycardia (HR to 42)   Penicillins Itching    Has patient had a PCN reaction causing immediate rash, facial/tongue/throat swelling, SOB or lightheadedness with hypotension: No Has patient had a PCN reaction causing severe rash involving mucus membranes or skin necrosis: No Has patient had a PCN reaction that required hospitalization No Has patient had a PCN reaction occurring within the last 10 years: No If all of the above answers are "NO", then may proceed with Cephalosporin use.   Empagliflozin Other (See Comments)    Dizzy , Dysphoria,  Patient preference to avoid use.    Objective:  There were no vitals filed for this visit. There is no height or weight on file to calculate BMI. Constitutional Well developed. Well nourished.  Vascular Dorsalis pedis pulses palpable bilaterally. Posterior tibial pulses palpable bilaterally. Capillary refill normal to all digits.  No cyanosis or clubbing noted. Pedal hair growth normal.  Neurologic Normal speech. Oriented to person, place, and time. Epicritic sensation to light touch grossly present bilaterally.  Dermatologic Nails well groomed and normal in appearance. No open wounds. No skin lesions.  Orthopedic: pain on palpation to the left first  metatarsophalangeal joint.  Mild pain with range of motion of the first MPJ.  Mild intra-articular pain noted.  Mild pain on palpation of the right first metatarsophalangeal joint mild pain on palpation to the joint. Very mild pain on palpation to the left second toe at the level of the DIPJ joint.  No pain with range of motion of either joints.   Radiographs: 3 views of skeletally mature adult bilateral foot: Osteoarthritic changes noted to the midfoot bilaterally.  Osteoarthrosis noted to the first metatarsophalangeal joint bilaterally with left greater than right side.  Generalized osteopenia noted.  No other bony abnormalities noted.  Severe pes planus foot structure noted as well. Assessment:  No diagnosis found.    Plan:  Patient was evaluated and treated and all questions answered.  Left first metatarsophalangeal joint capsulitis with underlying osteoarthritis left greater than right -A steroid injection was performed at left first MTP using 1% plain Lidocaine and 10 mg of Kenalog. This was well tolerated. -Discussed prevention options. -Tramadol was dispensed for pain control.      No follow-ups on file.

## 2023-09-27 ENCOUNTER — Other Ambulatory Visit: Payer: Self-pay

## 2023-09-27 DIAGNOSIS — E039 Hypothyroidism, unspecified: Secondary | ICD-10-CM

## 2023-09-27 DIAGNOSIS — N401 Enlarged prostate with lower urinary tract symptoms: Secondary | ICD-10-CM

## 2023-09-30 ENCOUNTER — Encounter: Payer: Self-pay | Admitting: Pharmacist

## 2023-09-30 ENCOUNTER — Ambulatory Visit (INDEPENDENT_AMBULATORY_CARE_PROVIDER_SITE_OTHER): Payer: Medicare Other | Admitting: Pharmacist

## 2023-09-30 VITALS — BP 128/76 | HR 71 | Wt 204.6 lb

## 2023-09-30 DIAGNOSIS — E785 Hyperlipidemia, unspecified: Secondary | ICD-10-CM | POA: Diagnosis not present

## 2023-09-30 DIAGNOSIS — R3914 Feeling of incomplete bladder emptying: Secondary | ICD-10-CM | POA: Diagnosis not present

## 2023-09-30 DIAGNOSIS — N401 Enlarged prostate with lower urinary tract symptoms: Secondary | ICD-10-CM | POA: Diagnosis not present

## 2023-09-30 DIAGNOSIS — Z794 Long term (current) use of insulin: Secondary | ICD-10-CM

## 2023-09-30 DIAGNOSIS — M1A09X0 Idiopathic chronic gout, multiple sites, without tophus (tophi): Secondary | ICD-10-CM | POA: Diagnosis not present

## 2023-09-30 DIAGNOSIS — E119 Type 2 diabetes mellitus without complications: Secondary | ICD-10-CM | POA: Diagnosis not present

## 2023-09-30 DIAGNOSIS — E1169 Type 2 diabetes mellitus with other specified complication: Secondary | ICD-10-CM | POA: Diagnosis not present

## 2023-09-30 LAB — POCT GLYCOSYLATED HEMOGLOBIN (HGB A1C): HbA1c, POC (controlled diabetic range): 7.4 % — AB (ref 0.0–7.0)

## 2023-09-30 MED ORDER — TAMSULOSIN HCL 0.4 MG PO CAPS: 0.8000 mg | ORAL_CAPSULE | Freq: Every day | ORAL | 3 refills | Status: DC

## 2023-09-30 MED ORDER — INSULIN GLARGINE-YFGN 100 UNIT/ML ~~LOC~~ SOPN: 18.0000 [IU] | PEN_INJECTOR | Freq: Every day | SUBCUTANEOUS | Status: DC

## 2023-09-30 MED ORDER — TADALAFIL 10 MG PO TABS: 10.0000 mg | ORAL_TABLET | Freq: Every day | ORAL | 3 refills | Status: AC

## 2023-09-30 MED ORDER — LEVOTHYROXINE SODIUM 75 MCG PO TABS: 75.0000 ug | ORAL_TABLET | Freq: Every day | ORAL | 3 refills | Status: DC

## 2023-09-30 NOTE — Progress Notes (Signed)
 S:     Chief Complaint  Patient presents with   Medication Management    Diabetes f/u   68 y.o. male who presents for diabetes evaluation, education, and management. Patient arrives in good spirits and presents without any assistance.   Patient was referred and last seen by Primary Care Provider, Dr. Sherrilee Gilles, on 07/23/23.   PMH is significant for HTN, T2DM, HLD, gout.  At last visit, increased semaglutide (Ozempic) from 20 to 30 clicks.   Current diabetes medications include: metformin 1000 mg BID, dapagliflozin (Farxiga) 10 mg, semaglutide (Ozempic) 30 clicks weekly (not taking), insulin glargine (Semglee) 22 units daily Current hypertension medications include: losartan 100 mg, amlodipine 10 mg Current hyperlipidemia medications include: rosuvastatin 20 mg,   Patient reports adherence to taking most medications as prescribed. Patient reports not taking Ozempic since last visit.  Do you feel that your medications are working for you? yes Have you been experiencing any side effects to the medications prescribed? no Do you have any problems obtaining medications due to transportation or finances? no Insurance coverage: [employer]  Patient denies hypoglycemic events.  O:   Review of Systems  All other systems reviewed and are negative.   Physical Exam Constitutional:      Appearance: Normal appearance.  Pulmonary:     Effort: Pulmonary effort is normal.  Neurological:     Mental Status: He is alert.  Psychiatric:        Mood and Affect: Mood normal.        Behavior: Behavior normal.        Thought Content: Thought content normal.        Judgment: Judgment normal.    Libre3 CGM Download today 09/30/23 % Time CGM is active: 96% Average Glucose: 154 mg/dL Glucose Management Indicator: 7.0  Glucose Variability: 22.7% (goal <36%) Time in Goal:  - Time in range 70-180: 78% - Time above range: 22% - Time below range: 0% Observed patterns: Remains  elevated within upper limit of target range   Lab Results  Component Value Date   HGBA1C 7.4 (A) 09/30/2023   Vitals:   09/30/23 1051  BP: 128/76  Pulse: 71  SpO2: 100%    Lipid Panel     Component Value Date/Time   CHOL 99 (L) 08/24/2022 1056   TRIG 122 08/24/2022 1056   HDL 48 08/24/2022 1056   CHOLHDL 2.1 08/24/2022 1056   CHOLHDL 2.5 10/26/2014 1118   VLDL 20 10/26/2014 1118   LDLCALC 29 08/24/2022 1056   LDLDIRECT 72 06/04/2012 0948    Clinical Atherosclerotic Cardiovascular Disease (ASCVD): Yes  The ASCVD Risk score (Arnett DK, et al., 2019) failed to calculate for the following reasons:   Risk score cannot be calculated because patient has a medical history suggesting prior/existing ASCVD   A/P: Diabetes longstanding currently improved. Patient is able to verbalize appropriate hypoglycemia management plan. Medication adherence appears fair.  Control is suboptimal due to low dose of Ozempic.  Plan to restart Ozempic (semaglutide) in attempt to minimize insulin, while maintaining improved control. -Decreased dose of basal insulin Semglee (insulin glargine) from 22 units to 18 units daily in the morning.   -Restarted GLP-1 Ozempic (semaglutide) from 30 clicks once weekly.  -Continued SGLT2-I Farxiga (dapagliflozin). Counseled on sick day rules. -Continued metformin.  -Patient educated on purpose, proper use, and potential adverse effects.  -Extensively discussed pathophysiology of diabetes, recommended lifestyle interventions, dietary effects on blood sugar control.  -Counseled on s/sx of and management  of hypoglycemia.  -BMET today  -Next A1c anticipated June 2025.   Gout no recent or current exacerbation.  Reports adherence to allopurinol.  -Check Uric Acid today.   Hyperlipidemia - previously at goal < LDL < 55 in patient with history of stroke.  Appears adherent with rosuvastatin 20mg  daily.  -Obtain Lipid panel today.   BPH - stable per patient report -  Refill short-term supply of medications Tamsulosin 0.8mg  daily  Tadalafil 10mg  daily  Written patient instructions provided. Patient verbalized understanding of treatment plan.  Total time in face to face counseling 37 minutes.    Follow-up:  Pharmacist PRN PCP clinic visit in 11/05/23 Patient seen with Threasa Heads, PharmD Candidate.

## 2023-09-30 NOTE — Patient Instructions (Signed)
 It was nice to see you today!  Your goal blood sugar is 80-130 before eating and less than 180 after eating.  Medication Changes: Restart Ozempic 30 clicks once weekly on Sunday.  Decrease Semglee from 22 to 18 units daily.  Continue all other medication the same.   Keep up the good work with diet and exercise. Aim for a diet full of vegetables, fruit and lean meats (chicken, [place], fish). Try to limit salt intake by eating fresh or frozen vegetables (instead of canned), rinse canned vegetables prior to cooking and do not add any additional salt to meals.

## 2023-09-30 NOTE — Assessment & Plan Note (Signed)
 Diabetes longstanding currently improved. Patient is able to verbalize appropriate hypoglycemia management plan. Medication adherence appears fair.  Control is suboptimal due to low dose of Ozempic.  Plan to restart Ozempic (semaglutide) in attempt to minimize insulin, while maintaining improved control. -Decreased dose of basal insulin Semglee (insulin glargine) from 22 units to 18 units daily in the morning.   -Restarted GLP-1 Ozempic (semaglutide) from 30 clicks once weekly.  -Continued SGLT2-I Farxiga (dapagliflozin). Counseled on sick day rules. -Continued metformin.  -Patient educated on purpose, proper use, and potential adverse effects.  -Extensively discussed pathophysiology of diabetes, recommended lifestyle interventions, dietary effects on blood sugar control.

## 2023-09-30 NOTE — Progress Notes (Signed)
 Reviewed and agree with Dr Macky Lower plan.

## 2023-10-01 ENCOUNTER — Telehealth: Payer: Self-pay | Admitting: Pharmacist

## 2023-10-01 LAB — BASIC METABOLIC PANEL
BUN/Creatinine Ratio: 8 — ABNORMAL LOW (ref 10–24)
BUN: 10 mg/dL (ref 8–27)
CO2: 22 mmol/L (ref 20–29)
Calcium: 9.5 mg/dL (ref 8.6–10.2)
Chloride: 99 mmol/L (ref 96–106)
Creatinine, Ser: 1.3 mg/dL — ABNORMAL HIGH (ref 0.76–1.27)
Glucose: 192 mg/dL — ABNORMAL HIGH (ref 70–99)
Potassium: 5 mmol/L (ref 3.5–5.2)
Sodium: 137 mmol/L (ref 134–144)
eGFR: 60 mL/min/{1.73_m2} (ref >59)

## 2023-10-01 LAB — LIPID PANEL
Chol/HDL Ratio: 1.9 ratio (ref 0.0–5.0)
Cholesterol, Total: 102 mg/dL (ref 100–199)
HDL: 53 mg/dL (ref >39)
LDL Chol Calc (NIH): 29 mg/dL (ref 0–99)
Triglycerides: 108 mg/dL (ref 0–149)
VLDL Cholesterol Cal: 20 mg/dL (ref 5–40)

## 2023-10-01 LAB — URIC ACID: Uric Acid: 2.9 mg/dL — ABNORMAL LOW (ref 3.8–8.4)

## 2023-10-01 NOTE — Telephone Encounter (Signed)
 Patient contacted for follow-up of lab results.   Shared that all tests were stable or normal.  Patient verbalized insulin and GLP treatment plan accurately that we discussed yesterday.    Total time with patient call and documentation of interaction: 6 minutes.

## 2023-10-02 NOTE — Telephone Encounter (Signed)
 Reviewed and agree with Dr Macky Lower plan.

## 2023-10-03 ENCOUNTER — Other Ambulatory Visit: Payer: Self-pay | Admitting: *Deleted

## 2023-10-03 DIAGNOSIS — I10 Essential (primary) hypertension: Secondary | ICD-10-CM

## 2023-10-03 MED ORDER — CLONIDINE HCL 0.1 MG PO TABS: 0.1000 mg | ORAL_TABLET | Freq: Two times a day (BID) | ORAL | 3 refills | Status: DC

## 2023-10-24 ENCOUNTER — Ambulatory Visit: Admitting: Podiatry

## 2023-10-29 ENCOUNTER — Other Ambulatory Visit: Payer: Self-pay

## 2023-10-29 DIAGNOSIS — E039 Hypothyroidism, unspecified: Secondary | ICD-10-CM

## 2023-10-30 MED ORDER — LEVOTHYROXINE SODIUM 75 MCG PO TABS: 75.0000 ug | ORAL_TABLET | Freq: Every day | ORAL | 3 refills | Status: DC

## 2023-11-04 ENCOUNTER — Other Ambulatory Visit: Payer: Self-pay | Admitting: Family Medicine

## 2023-11-04 ENCOUNTER — Ambulatory Visit: Payer: Medicare HMO

## 2023-11-04 VITALS — Ht 73.0 in | Wt 235.0 lb

## 2023-11-04 DIAGNOSIS — Z0000 Encounter for general adult medical examination without abnormal findings: Secondary | ICD-10-CM | POA: Diagnosis not present

## 2023-11-04 NOTE — Progress Notes (Signed)
 Because this visit was a virtual/telehealth visit,  certain criteria was not obtained, such a blood pressure, CBG if applicable, and timed get up and go. Any medications not marked as "taking" were not mentioned during the medication reconciliation part of the visit. Any vitals not documented were not able to be obtained due to this being a telehealth visit or patient was unable to self-report a recent blood pressure reading due to a lack of equipment at home via telehealth. Vitals that have been documented are verbally provided by the patient.   Subjective:   Andrew [name] is a 68 y.o. who presents for a Medicare Wellness preventive visit.  Visit Complete: Virtual I connected with  Andrew [name] on 11/04/23 by a audio enabled telemedicine application and verified that I am speaking with the correct person using two identifiers.  Patient Location: Home  Provider Location: Office/Clinic  I discussed the limitations of evaluation and management by telemedicine. The patient expressed understanding and agreed to proceed.  Vital Signs: Because this visit was a virtual/telehealth visit, some criteria may be missing or patient reported. Any vitals not documented were not able to be obtained and vitals that have been documented are patient reported.  VideoDeclined- This patient declined Librarian, academic. Therefore the visit was completed with audio only.  Persons Participating in Visit: Patient.  AWV Questionnaire: No: Patient Medicare AWV questionnaire was not completed prior to this visit.  Cardiac Risk Factors include: advanced age (>32men, >42 women);dyslipidemia;family history of premature cardiovascular disease;diabetes mellitus;hypertension;male gender;obesity (BMI >30kg/m2)     Objective:    Today's Vitals   11/04/23 0915  Weight: 235 lb (106.6 kg)  Height: 6\' 1"  (1.854 m)  PainSc: 0-No pain   Body mass index is 31 kg/m.     11/04/2023     9:17 AM 07/23/2023    9:00 AM 06/24/2023    3:52 PM 04/24/2023    8:33 AM 04/19/2023    8:56 AM 01/03/2023    3:02 PM 10/02/2022    9:13 AM  Advanced Directives  Does Patient Have a Medical Advance Directive? No No No No No No No  Would patient like information on creating a medical advance directive? No - Patient declined No - Patient declined  No - Patient declined No - Patient declined No - Patient declined No - Patient declined    Current Medications (verified) Outpatient Encounter Medications as of 11/04/2023  Medication Sig   Accu-Chek Softclix Lancets lancets USE TO TEST BLOOD SUGAR 5 TIMES  DAILY (Patient not taking: Reported on 09/30/2023)   Acetaminophen  (TYLENOL  ARTHRITIS EXT RELIEF PO) Take 650 mg by mouth 1 day or 1 dose.   allopurinol  (ZYLOPRIM ) 100 MG tablet Take 1 tablet (100 mg total) by mouth daily.   amLODipine  (NORVASC ) 10 MG tablet Take 1 tablet (10 mg total) by mouth daily.   aspirin  EC 81 MG tablet Take 1 tablet (81 mg total) by mouth daily.   Blood Glucose Monitoring Suppl (ACCU-CHEK GUIDE) w/Device KIT Use to check blood sugar 3x per day. E11.9 (Patient not taking: Reported on 09/30/2023)   cloNIDine  (CATAPRES ) 0.1 MG tablet Take 1 tablet (0.1 mg total) by mouth 2 (two) times daily.   Continuous Glucose Sensor (FREESTYLE LIBRE 3 PLUS SENSOR) MISC Change sensor every 15 days.   dapagliflozin  propanediol (FARXIGA ) 10 MG TABS tablet Take 1 tablet (10 mg total) by mouth daily.   fluticasone  (FLONASE ) 50 MCG/ACT nasal spray SPRAY 2 SPRAYS INTO EACH NOSTRIL  EVERY DAY   gabapentin  (NEURONTIN ) 100 MG capsule Take 1 capsule (100 mg total) by mouth 3 (three) times daily as needed.   glucose blood (ACCU-CHEK GUIDE) test strip USE TO CHECK BLOOD SUGAR 5 TIMES DAILY (Patient not taking: Reported on 09/30/2023)   insulin  glargine-yfgn (SEMGLEE ) 100 UNIT/ML Pen Inject 18 Units into the skin daily.   Insulin  Pen Needle 32G X 4 MM MISC 1 Container by Does not apply route as needed.    latanoprost (XALATAN) 0.005 % ophthalmic solution Place 1 drop into both eyes at bedtime.   levothyroxine  (SYNTHROID ) 75 MCG tablet Take 1 tablet (75 mcg total) by mouth daily before breakfast. Take 1 tablet (75 mcg total) by mouth daily before breakfast   losartan  (COZAAR ) 100 MG tablet Take 1 tablet (100 mg total) by mouth daily.   meloxicam  (MOBIC ) 15 MG tablet Take 1 tablet (15 mg total) by mouth daily.   metFORMIN  (GLUCOPHAGE -XR) 500 MG 24 hr tablet Take 2 tablets (1,000 mg total) by mouth 2 (two) times daily.   rosuvastatin  (CRESTOR ) 20 MG tablet Take 1 tablet (20 mg total) by mouth daily.   Semaglutide , 1 MG/DOSE, 4 MG/3ML SOPN Inject 1 mg into the skin once a week. Inject 20 clicks once weekly (Patient not taking: Reported on 09/30/2023)   tadalafil  (CIALIS ) 10 MG tablet Take 1 tablet (10 mg total) by mouth daily.   tamsulosin  (FLOMAX ) 0.4 MG CAPS capsule Take 2 capsules (0.8 mg total) by mouth daily.   Turmeric POWD Take 5 mLs by mouth daily. Mixes 1 teaspoon in water every day and drinks it. Uses for arthritis. (Patient not taking: Reported on 09/30/2023)   No facility-administered encounter medications on file as of 11/04/2023.    Allergies (verified) Metoprolol succinate, Penicillins, and Empagliflozin    History: 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   Qualifier: Diagnosis of  By: VASQUEZ, GRISELDA     ERECTILE DYSFUNCTION 10/18/2006   Qualifier: Diagnosis of  By: VASQUEZ, GRISELDA     HAMMER TOE 07/25/2010   HLD (hyperlipidemia)    HTN (hypertension)    Hypothyroidism    Leg swelling    Lipoma of back 03/18/2011   Personal history of colonic  adenomas 04/01/2013   Primary osteoarthritis of right knee 05/21/2018   Prostatitis    hx of x2   PSEUDOFOLLICULITIS BARBAE 03/10/2008   Qualifier: Diagnosis of  By: Mariam Shingles  MD, Javier     Sarcoidosis    Status post right knee replacement 05/15/2017   T2DM (type 2 diabetes mellitus) (HCC)    Past Surgical History:  Procedure Laterality Date   COLONOSCOPY  03/24/2013   COLONOSCOPY W/ POLYPECTOMY     ERCP  2006   Normal   ESOPHAGOGASTRODUODENOSCOPY     EYE SURGERY Bilateral    cataracts   HERNIA REPAIR  1985, 1995   x2 'inguinal   LIPOMA EXCISION  06/22/2011   Procedure: EXCISION LIPOMA;  Surgeon: Enid Harry, MD;  Location: [facility];  Service: General;  Laterality: N/A;   TOOTH EXTRACTION     TOTAL KNEE ARTHROPLASTY Left 05/15/2017   Procedure: LEFT  TOTAL KNEE ARTHROPLASTY;  Surgeon: Wes Hamman, MD;  Location[facility] OR;  Service: Orthopedics;  Laterality: Left;   TOTAL KNEE ARTHROPLASTY Right 06/09/2018   Procedure: RIGHT TOTAL KNEE ARTHROPLASTY;  Surgeon: Wes Hamman, MD;  Location: [facility] OR;  Service: Orthopedics;  Laterality: Right;   TRANSTHORACIC ECHOCARDIOGRAM  2005   EF 60%   Family History  Problem Relation Age of Onset   Bone cancer Father    Cancer Father 19       bone   Uterine cancer Mother    Cancer Mother    Heart attack Brother    Kidney disease Brother    Diabetes Brother    Arthritis Brother    Heart disease Sister    Congestive Heart Failure Sister    Congestive Heart Failure Sister    Congestive Heart Failure Sister    Cancer Sister    Diabetes Sister    Arthritis Sister    Breast cancer Sister    Colon cancer Neg Hx    Rectal cancer Neg Hx    Stomach cancer Neg Hx    Colon polyps Neg Hx    Esophageal cancer Neg Hx    Social History   Socioeconomic History   Marital status: Divorced    Spouse name: Not on file   Number of children: 1   Years of education: 14   Highest education level: 12th grade  Occupational History    Occupation: retired    Comment: worked as Advertising copywriter  Tobacco Use   Smoking status: Never   Smokeless tobacco: Never   Tobacco comments:    Never smoker; no plans to start  Vaping Use   Vaping status: Never Used  Substance and Sexual Activity   Alcohol  use: No   Drug use: No   Sexual activity: Not Currently  Other Topics Concern   Not on file  Social History Narrative   Patient is a retired Veterinary surgeon at [employer]. Remote etoh and tobacco history, stopped 20 years ago. 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      Lives in home with son, and dog. Home is one level with 3 steps in go in. Has hand rails. No smoke alarms. Will contact fire department to seek assistance on smoke alarms. No grab-bars in bathroom. Throw rugs are flat and attached. Walks the dog daily for exercise.   Always wears seat belt in car.      Patient does state some financial insecurity around paying for his medications, including co-pays. Had to stop insulin  due to cost.    Patient needs to have his dentures modified but has not dental coverage. Will ask LCSW to reach out with resources.     Social Drivers of Corporate investment banker Strain: Low Risk  (11/04/2023)   Overall Financial Resource Strain (CARDIA)    Difficulty of Paying Living Expenses: Not hard at all  Food Insecurity: No Food Insecurity (11/04/2023)   Hunger Vital Sign    Worried About Running Out of Food in the Last Year: Never true    Ran Out of Food in the Last Year: Never true  Transportation Needs: No Transportation Needs (11/04/2023)   PRAPARE - Administrator, Civil Service (Medical): No    Lack of Transportation (Non-Medical): No  Physical Activity: Sufficiently Active (11/04/2023)   Exercise Vital Sign    Days of Exercise per Week: 7 days    Minutes of Exercise per Session: 30  min  Stress: No Stress Concern Present (11/04/2023)   [employer] of Occupational Health -  Occupational Stress Questionnaire    Feeling of Stress : Not at all  Social Connections: Moderately Integrated (11/04/2023)   Social Connection and Isolation Panel [NHANES]    Frequency of Communication with Friends and Family: More than three times a week    Frequency of Social Gatherings with Friends and Family: More than three times a week    Attends Religious Services: More than 4 times per year    Active Member of [employer] or Organizations: Yes    Attends Engineer, structural: More than 4 times per year    Marital Status: Divorced    Tobacco Counseling Counseling given: Not Answered Tobacco comments: Never smoker; no plans to start    Clinical Intake:  Pre-visit preparation completed: Yes  Pain : No/denies pain Pain Score: 0-No pain     BMI - recorded: 31 Nutritional Status: BMI > 30  Obese Nutritional Risks: None Diabetes: Yes CBG done?: No Did pt. bring in CBG monitor from home?: No  Lab Results  Component Value Date   HGBA1C 7.4 (A) 09/30/2023   HGBA1C 8.1 (A) 06/04/2023   HGBA1C 8.8 (A) 03/04/2023     How often do you need to have someone help you when you read instructions, pamphlets, or other written materials from your doctor or pharmacy?: 1 - Never  Interpreter Needed?: No  Information entered by :: Zykeria Laguardia N. Heberto Sturdevant, LPN.   Activities of Daily Living     11/04/2023    9:18 AM  In your present state of health, do you have any difficulty performing the following activities:  Hearing? 0  Vision? 0  Difficulty concentrating or making decisions? 0  Walking or climbing stairs? 0  Dressing or bathing? 0  Doing errands, shopping? 0  Preparing Food and eating ? N  Using the Toilet? N  In the past six months, have you accidently leaked urine? N  Do you have problems with loss of bowel control? N  Managing your Medications? N  Managing your Finances? N  Housekeeping or managing your Housekeeping? N    Patient Care Team: Albin Huh, MD as  PCP - General (Family Medicine) Albert Huff, MD as Consulting Physician (Ophthalmology) Kenney Peacemaker, MD as Consulting Physician (Gastroenterology) Wes Hamman, MD as Attending Physician (Orthopedic Surgery) Dorothe Gaster, RD as Dietitian (Family Medicine)  Indicate any recent Medical Services you may have received from other than [facility] providers in the past year (date may be approximate).     Assessment:   This is a routine wellness examination for Andrew [name].  Hearing/Vision screen Hearing Screening - Comments:: Denies hearing difficulties.  Vision Screening - Comments:: Wears rx glasses - up to date with routine eye exams with Dr. Albert Huff     Goals Addressed               This Visit's Progress     Blood Pressure < 130/80        COMPLETED: Having bone spur fixed on left foot (pt-stated)        HEMOGLOBIN A1C < 6.5        Patient Stated        11/04/2023: "My goal is to keep my weight at 230 lbs, watch what I eat and continue to exercise everyday."       Depression Screen     11/04/2023    9:18 AM 07/23/2023  9:00 AM 04/24/2023    8:33 AM 04/19/2023    8:57 AM 01/03/2023    3:02 PM 11/23/2022    1:47 PM 10/02/2022    9:09 AM  PHQ 2/9 Scores  PHQ - 2 Score 0 0 0 0 0 0 0  PHQ- 9 Score 0 0 0 0 0 0     Fall Risk     11/04/2023    9:18 AM 07/23/2023    9:00 AM 06/04/2023    9:26 AM 04/24/2023    8:33 AM 04/19/2023    8:56 AM  Fall Risk   Falls in the past year? 0 0 0 0 0  Number falls in past yr: 0 0 0 0 0  Injury with Fall? 0 0 0 0 0  Risk for fall due to : No Fall Risks      Follow up Falls prevention discussed;Falls evaluation completed  Falls evaluation completed      MEDICARE RISK AT HOME:  Medicare Risk at Home Any stairs in or around the home?: Yes (front entry) If so, are there any without handrails?: No Home free of loose throw rugs in walkways, pet beds, electrical cords, etc?: Yes Adequate lighting in your home to reduce risk of falls?:  Yes Life alert?: No Use of a cane, walker or w/c?: No Grab bars in the bathroom?: No Shower chair or bench in shower?: No Elevated toilet seat or a handicapped toilet?: No  TIMED UP AND GO:  Was the test performed?  No  Cognitive Function: 6CIT completed    11/04/2023    9:19 AM 04/10/2018    4:30 PM  MMSE - Mini Mental State Exam  Not completed: Unable to complete   Orientation to time  5  Orientation to Place  5  Registration  3  Attention/ Calculation  5  Recall  2  Language- name 2 objects  2  Language- repeat  1  Language- follow 3 step command  3  Language- read & follow direction  1  Write a sentence  1  Copy design  1  Total score  29        11/04/2023    9:19 AM 10/02/2022    9:13 AM 04/10/2018    4:31 PM  6CIT Screen  What Year? 0 points 0 points 0 points  What month? 0 points 0 points 0 points  What time? 0 points 0 points 0 points  Count back from 20 0 points 0 points 0 points  Months in reverse 0 points 2 points 2 points  Repeat phrase 0 points 4 points 0 points  Total Score 0 points 6 points 2 points    Immunizations Immunization History  Administered Date(s) Administered   Fluad Quad(high Dose 65+) 03/23/2021, 04/02/2022   Fluad Trivalent(High Dose 65+) 03/14/2023   Influenza Split 04/30/2011, 03/21/2012   Influenza Whole 05/12/2007, 04/28/2009, 07/25/2010   Influenza,inj,Quad PF,6+ Mos 03/20/2013, 04/14/2014, 03/24/2015, 04/03/2016, 03/15/2017, 03/05/2018, 03/05/2019, 04/01/2020   PFIZER Comirnaty(Gray Top)Covid-19 Tri-Sucrose Vaccine 10/12/2020   PFIZER(Purple Top)SARS-COV-2 Vaccination 09/26/2019, 10/23/2019, 04/28/2020   PNEUMOCOCCAL CONJUGATE-20 02/17/2021   Pfizer Covid-19 Vaccine Bivalent Booster 58yrs & up 04/18/2021   Pfizer(Comirnaty)Fall Seasonal Vaccine 12 years and older 03/15/2023   Pneumococcal Conjugate-13 05/10/2015   Pneumococcal Polysaccharide-23 04/14/2014   RSV,unspecified 07/25/2022   Td 05/09/2004   Zoster  Recombinant(Shingrix) 03/20/2021, 07/11/2022   Zoster, Live 04/30/2016   Zoster, Unspecified 04/30/2016    Screening Tests Health Maintenance  Topic Date Due   DTaP/Tdap/Td (  2 - Tdap) 05/09/2014   COVID-19 Vaccine (7 - Pfizer risk 2024-25 season) 09/12/2023   INFLUENZA VACCINE  02/07/2024   HEMOGLOBIN A1C  04/01/2024   OPHTHALMOLOGY EXAM  04/23/2024   Diabetic kidney evaluation - Urine ACR  06/23/2024   FOOT EXAM  06/23/2024   Diabetic kidney evaluation - eGFR measurement  09/29/2024   Medicare Annual Wellness (AWV)  11/03/2024   Colonoscopy  05/20/2028   Pneumonia Vaccine 57+ Years old  Completed   Hepatitis C Screening  Completed   Zoster Vaccines- Shingrix  Completed   HPV VACCINES  Aged Out   Meningococcal B Vaccine  Aged Out    Health Maintenance  Health Maintenance Due  Topic Date Due   DTaP/Tdap/Td (2 - Tdap) 05/09/2014   COVID-19 Vaccine (7 - Pfizer risk 2024-25 season) 09/12/2023   Health Maintenance Items Addressed: Yes, Patient is due for Dtap and Covid Vaccines.  Additional Screening:  Vision Screening: Recommended annual ophthalmology exams for early detection of glaucoma and other disorders of the eye.  Dental Screening: Recommended annual dental exams for proper oral hygiene  Community Resource Referral / Chronic Care Management: CRR required this visit?  No   CCM required this visit?  No     Plan:     I have personally reviewed and noted the following in the patient's chart:   Medical and social history Use of alcohol , tobacco or illicit drugs  Current medications and supplements including opioid prescriptions. Patient is not currently taking opioid prescriptions. Functional ability and status Nutritional status Physical activity Advanced directives List of other physicians Hospitalizations, surgeries, and ER visits in previous 12 months Vitals Screenings to include cognitive, depression, and falls Referrals and appointments  In  addition, I have reviewed and discussed with patient certain preventive protocols, quality metrics, and best practice recommendations. A written personalized care plan for preventive services as well as general preventive health recommendations were provided to patient.     Margette Sheldon, LPN   1/61/0960   After Visit Summary: (MyChart) Due to this being a telephonic visit, the after visit summary with patients personalized plan was offered to patient via MyChart   Notes: Please refer to Routing Comments.

## 2023-11-04 NOTE — Patient Instructions (Addendum)
 Andrew [name] , Thank you for taking time to come for your Medicare Wellness Visit. I appreciate your ongoing commitment to your health goals. Please review the following plan we discussed and let me know if I can assist you in the future.   Referrals/Orders/Follow-Ups/Clinician Recommendations: Yes, keep maintaining your health by keeping your appointments with Dr. Albin Huh and any specialists that you may see.  Call us  if you need anything.  Have a great year!!!!  This is a list of the screening recommended for you and due dates:  Health Maintenance  Topic Date Due   DTaP/Tdap/Td vaccine (2 - Tdap) 05/09/2014   COVID-19 Vaccine (7 - Pfizer risk 2024-25 season) 09/12/2023   Flu Shot  02/07/2024   Hemoglobin A1C  04/01/2024   Eye exam for diabetics  04/23/2024   Yearly kidney health urinalysis for diabetes  06/23/2024   Complete foot exam   06/23/2024   Yearly kidney function blood test for diabetes  09/29/2024   Medicare Annual Wellness Visit  11/03/2024   Colon Cancer Screening  05/20/2028   Pneumonia Vaccine  Completed   Hepatitis C Screening  Completed   Zoster (Shingles) Vaccine  Completed   HPV Vaccine  Aged Out   Meningitis B Vaccine  Aged Out    Advanced directives: (Declined) Advance directive discussed with you today. Even though you declined this today, please call our office should you change your mind, and we can give you the proper paperwork for you to fill out.  Next Medicare Annual Wellness Visit scheduled for next year: Yes

## 2023-11-05 ENCOUNTER — Encounter: Payer: Self-pay | Admitting: Family Medicine

## 2023-11-05 ENCOUNTER — Ambulatory Visit: Admitting: Family Medicine

## 2023-11-05 VITALS — BP 120/70 | HR 67 | Ht 73.0 in | Wt 202.4 lb

## 2023-11-05 DIAGNOSIS — M1A9XX0 Chronic gout, unspecified, without tophus (tophi): Secondary | ICD-10-CM

## 2023-11-05 DIAGNOSIS — I10 Essential (primary) hypertension: Secondary | ICD-10-CM | POA: Diagnosis not present

## 2023-11-05 NOTE — Progress Notes (Signed)
    SUBJECTIVE:   CHIEF COMPLAINT / HPI:   SL is a 68yo F w/ hx of gout, T2DM, hypothyroid that pf HTN f/u.  HTN - Pt is taking meds consistently. He is motivated to take care of his health since a few of his siblings have passed.   T2DM - Reports taking insulin  and metformin  consistently - Does not need refills at this time  Gout - Continues to take daily allopurinol . - Reports its been a long time since his last flare    OBJECTIVE:   BP 120/70   Pulse 67   Ht 6\' 1"  (1.854 m)   Wt 202 lb 6.4 oz (91.8 kg)   SpO2 98%   BMI 26.70 kg/m   General: Alert, pleasant man. NAD. HEENT: NCAT. MMM. CV: RRR, no murmurs.  Resp: CTAB, no wheezing or crackles. Normal WOB on RA.  Abm: Soft, nontender, nondistended. BS present. Ext: Moves all ext spontaneously Skin: Warm, well perfused   ASSESSMENT/PLAN:   Assessment & Plan Primary hypertension Well controlled, congratulated pt.  - Cont amlo, losartan , clonidine  Chronic gout involving toe without tophus, unspecified cause, unspecified laterality Well controlled. Uric acid wnl. Cont allopurinol .    Due for TSH in June 2025  Albin Huh, MD [facility]

## 2023-11-05 NOTE — Patient Instructions (Signed)
 Good to see you today - Thank you for coming in  Things we discussed today:  1) Your blood pressure and diabetes are doing well.   2) You are due for a thyroid  level check in June 2025. Make an appointment to see me around June to August

## 2023-11-07 ENCOUNTER — Telehealth: Payer: Self-pay

## 2023-11-07 NOTE — Telephone Encounter (Signed)
 Pharmacy Patient Advocate Encounter   Received notification from CoverMyMeds that prior authorization for FreeStyle Libre 3 Plus Sensor is required/requested.   Insurance verification completed.   The patient is insured through [facility] .   PA required; PA submitted to above mentioned insurance via CoverMyMeds Key/confirmation #/EOC [id number]. Status is pending

## 2023-11-11 ENCOUNTER — Other Ambulatory Visit (HOSPITAL_COMMUNITY): Payer: Self-pay

## 2023-11-11 NOTE — Telephone Encounter (Signed)
 PA CLOSED - Member not found.   Insurance investigation shows patient has [employer] coverage.   Test claim shows medication copay $145 with a message from insurance:  PART B MEDICATIONS EXCLUDED FROM TROOP. PREFER ACCU-CHEK OR TRUE METRIX.  Pharmacy may be able to run under Part B for better coverage.

## 2023-11-20 ENCOUNTER — Ambulatory Visit: Admitting: Podiatry

## 2023-11-29 ENCOUNTER — Other Ambulatory Visit: Payer: Self-pay

## 2023-11-29 DIAGNOSIS — E039 Hypothyroidism, unspecified: Secondary | ICD-10-CM

## 2023-11-30 MED ORDER — LEVOTHYROXINE SODIUM 75 MCG PO TABS: 75.0000 ug | ORAL_TABLET | Freq: Every day | ORAL | 3 refills | Status: DC

## 2023-12-09 ENCOUNTER — Other Ambulatory Visit: Payer: Self-pay

## 2023-12-09 MED ORDER — LANTUS SOLOSTAR 100 UNIT/ML ~~LOC~~ SOPN: 18.0000 [IU] | PEN_INJECTOR | Freq: Every day | SUBCUTANEOUS | 6 refills | Status: DC

## 2023-12-09 NOTE — Telephone Encounter (Signed)
 Patient calls nurse line requesting refill on Lantus  pens.   Semglee  was sent in last, however, it appears that Lantus  is now preferred.   Order pended to encounter.   Elsie Halo, RN

## 2023-12-11 ENCOUNTER — Ambulatory Visit: Admitting: Podiatry

## 2023-12-11 DIAGNOSIS — L97522 Non-pressure chronic ulcer of other part of left foot with fat layer exposed: Secondary | ICD-10-CM | POA: Diagnosis not present

## 2023-12-11 DIAGNOSIS — Z794 Long term (current) use of insulin: Secondary | ICD-10-CM | POA: Diagnosis not present

## 2023-12-11 DIAGNOSIS — E119 Type 2 diabetes mellitus without complications: Secondary | ICD-10-CM | POA: Diagnosis not present

## 2023-12-11 MED ORDER — DOXYCYCLINE HYCLATE 100 MG PO TABS: 100.0000 mg | ORAL_TABLET | Freq: Two times a day (BID) | ORAL | 0 refills | Status: DC

## 2023-12-11 NOTE — Progress Notes (Signed)
 Subjective:  Patient ID: Andrew [name], male    DOB: 1955-09-23,  MRN: [id number]  Chief Complaint  Patient presents with   Wound Check    68 y.o. male presents with the above complaint.  Patient presents with left first metatarsophalangeal joint ulceration.  Patient states that it is painful just came out of nowhere.  He does have chronic kidney disease.  He does have diabetes as well.  He currently does not take any antibiotics wanted discuss treatment options for this.   Review of Systems: Negative except as noted in the HPI. Denies N/V/F/Ch.  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   Qualifier: Diagnosis of  By: VASQUEZ, GRISELDA     ERECTILE DYSFUNCTION 10/18/2006   Qualifier: Diagnosis of  By: VASQUEZ, GRISELDA     HAMMER TOE 07/25/2010   HLD (hyperlipidemia)    HTN (hypertension)    Hypothyroidism    Leg swelling    Lipoma of back 03/18/2011   Personal history of colonic adenomas 04/01/2013   Primary osteoarthritis of right knee 05/21/2018   Prostatitis    hx of x2   PSEUDOFOLLICULITIS BARBAE 03/10/2008   Qualifier: Diagnosis of  By: Mariam Shingles  MD, Jody Mura     Sarcoidosis    Status post right knee replacement 05/15/2017   T2DM (type 2 diabetes mellitus) (HCC)     Current Outpatient Medications:    doxycycline  (VIBRA -TABS) 100 MG tablet, Take 1 tablet (100 mg total) by mouth 2 (two) times daily., Disp: 28 tablet, Rfl: 0   Accu-Chek Softclix Lancets lancets, USE TO TEST BLOOD SUGAR 5 TIMES  DAILY (Patient not taking: Reported on 09/30/2023), Disp: 500 each, Rfl: 2   Acetaminophen  (TYLENOL  ARTHRITIS EXT RELIEF PO), Take 650 mg by mouth 1 day or 1  dose., Disp: , Rfl:    allopurinol  (ZYLOPRIM ) 100 MG tablet, Take 1 tablet (100 mg total) by mouth daily., Disp: 90 tablet, Rfl: 3   amLODipine  (NORVASC ) 10 MG tablet, Take 1 tablet (10 mg total) by mouth daily., Disp: 100 tablet, Rfl: 2   aspirin  EC 81 MG tablet, Take 1 tablet (81 mg total) by mouth daily., Disp: 90 tablet, Rfl: 2   Blood Glucose Monitoring Suppl (ACCU-CHEK GUIDE) w/Device KIT, Use to check blood sugar 3x per day. E11.9 (Patient not taking: Reported on 09/30/2023), Disp: 1 kit, Rfl: 0   cloNIDine  (CATAPRES ) 0.1 MG tablet, Take 1 tablet (0.1 mg total) by mouth 2 (two) times daily., Disp: 180 tablet, Rfl: 3   Continuous Glucose Sensor (FREESTYLE LIBRE 3 PLUS SENSOR) MISC, Change sensor every 15 days., Disp: 2 each, Rfl: 11   dapagliflozin  propanediol (FARXIGA ) 10 MG TABS tablet, Take 1 tablet (10 mg total) by mouth daily., Disp: 90 tablet, Rfl: 3   fluticasone  (FLONASE ) 50 MCG/ACT nasal spray, SPRAY 2 SPRAYS INTO EACH NOSTRIL EVERY DAY, Disp: 48 mL, Rfl: 2   gabapentin  (NEURONTIN ) 100 MG capsule, Take 1 capsule (100 mg total) by mouth 3 (three) times daily as needed., Disp: 90 capsule, Rfl: 3   glucose blood (ACCU-CHEK GUIDE) test strip, USE TO CHECK BLOOD SUGAR 5 TIMES DAILY (Patient not  taking: Reported on 09/30/2023), Disp: 500 strip, Rfl: 2   insulin  glargine (LANTUS  SOLOSTAR) 100 UNIT/ML Solostar Pen, Inject 18 Units into the skin daily., Disp: 15 mL, Rfl: 6   Insulin  Pen Needle 32G X 4 MM MISC, 1 Container by Does not apply route as needed., Disp: 100 each, Rfl: 11   latanoprost (XALATAN) 0.005 % ophthalmic solution, Place 1 drop into both eyes at bedtime., Disp: , Rfl:    levothyroxine  (SYNTHROID ) 75 MCG tablet, Take 1 tablet (75 mcg total) by mouth daily before breakfast. Take 1 tablet (75 mcg total) by mouth daily before breakfast, Disp: 90 tablet, Rfl: 3   losartan  (COZAAR ) 100 MG tablet, Take 1 tablet (100 mg total) by mouth daily., Disp: 90 tablet, Rfl: 3   meloxicam  (MOBIC )  15 MG tablet, TAKE 1 TABLET BY MOUTH DAILY, Disp: 100 tablet, Rfl: 2   metFORMIN  (GLUCOPHAGE -XR) 500 MG 24 hr tablet, Take 2 tablets (1,000 mg total) by mouth 2 (two) times daily., Disp: 400 tablet, Rfl: 2   rosuvastatin  (CRESTOR ) 20 MG tablet, Take 1 tablet (20 mg total) by mouth daily., Disp: 90 tablet, Rfl: 3   Semaglutide , 1 MG/DOSE, 4 MG/3ML SOPN, Inject 1 mg into the skin once a week. Inject 20 clicks once weekly (Patient not taking: Reported on 09/30/2023), Disp: , Rfl:    tadalafil  (CIALIS ) 10 MG tablet, Take 1 tablet (10 mg total) by mouth daily., Disp: 90 tablet, Rfl: 3   tamsulosin  (FLOMAX ) 0.4 MG CAPS capsule, Take 2 capsules (0.8 mg total) by mouth daily., Disp: 180 capsule, Rfl: 3   Turmeric POWD, Take 5 mLs by mouth daily. Mixes 1 teaspoon in water every day and drinks it. Uses for arthritis. (Patient not taking: Reported on 09/30/2023), Disp: , Rfl:   Social History   Tobacco Use  Smoking Status Never  Smokeless Tobacco Never  Tobacco Comments   Never smoker; no plans to start    Allergies  Allergen Reactions   Metoprolol Succinate Other (See Comments)    Bradycardia (HR to 42)   Penicillins Itching    Has patient had a PCN reaction causing immediate rash, facial/tongue/throat swelling, SOB or lightheadedness with hypotension: No Has patient had a PCN reaction causing severe rash involving mucus membranes or skin necrosis: No Has patient had a PCN reaction that required hospitalization No Has patient had a PCN reaction occurring within the last 10 years: No If all of the above answers are "NO", then may proceed with Cephalosporin use.   Empagliflozin  Other (See Comments)    Dizzy , Dysphoria,  Patient preference to avoid use.    Objective:  There were no vitals filed for this visit. There is no height or weight on file to calculate BMI. Constitutional Well developed. Well nourished.  Vascular Dorsalis pedis pulses palpable bilaterally. Posterior tibial pulses  palpable bilaterally. Capillary refill normal to all digits.  No cyanosis or clubbing noted. Pedal hair growth normal.  Neurologic Normal speech. Oriented to person, place, and time. Epicritic sensation to light touch grossly present bilaterally.  Dermatologic Left first metatarsophalangeal joint ulceration with fat layer exposed.  Does not probe down to deep tissue.  No purulent drainage noted.  Serosanguineous fluid expressed.  No other clinical signs of infection noted no cellulitis noted.  Orthopedic: Normal joint ROM without pain or crepitus bilaterally. No visible deformities. No bony tenderness.   Radiographs: None Assessment:   1. 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 Type 2 diabetes  mellitus without complication, with long-term current use of insulin  [facility])    Plan:  Patient was evaluated and treated and all questions answered.  Left first metatarsophalangeal joint ulceration with fat layer exposed - All questions and concerns were discussed with the patient in extensive detail - Patient given that he is a diabetic he is at high risk of undergoing amputation - He will benefit from doxycycline  for skin and soft tissue prophylaxis against infection - Patient will benefit from Betadine wet-to-dry dressing changes I discussed with the patient to do Betadine wet-to-dry dressing every single day he states understanding - Surgical shoe was dispensed. - I instructed him to go to the emergency room if the wound worsens or if there is clinical signs of infection he states understanding  No follow-ups on file.  Left first MTP ulceration fat layer exposed doxycycline  Betadine surgical shoe

## 2023-12-16 DIAGNOSIS — H401131 Primary open-angle glaucoma, bilateral, mild stage: Secondary | ICD-10-CM | POA: Diagnosis not present

## 2023-12-23 ENCOUNTER — Telehealth: Payer: Self-pay | Admitting: Pharmacist

## 2023-12-23 DIAGNOSIS — E119 Type 2 diabetes mellitus without complications: Secondary | ICD-10-CM

## 2023-12-23 MED ORDER — FREESTYLE LIBRE 3 PLUS SENSOR MISC: 11 refills | Status: DC

## 2023-12-23 NOTE — Telephone Encounter (Signed)
 Patient contacts office requesting assistance with CGM (continuous glucose monitor) supply/new prescription.  Left Voice Mail on office phone while I was out of office.   Returned call 6/16 - patient has received sensors from [facility] in the past.  GMI currently 7.1 with CGM use and current regimen  New prescription for [facility] 3+ sensors [facility].  Total time with patient call and documentation of interaction: 12 minutes.

## 2023-12-24 ENCOUNTER — Other Ambulatory Visit: Payer: Self-pay

## 2023-12-24 DIAGNOSIS — I10 Essential (primary) hypertension: Secondary | ICD-10-CM

## 2023-12-26 ENCOUNTER — Other Ambulatory Visit: Payer: Self-pay

## 2023-12-26 DIAGNOSIS — E039 Hypothyroidism, unspecified: Secondary | ICD-10-CM

## 2023-12-26 MED ORDER — LEVOTHYROXINE SODIUM 75 MCG PO TABS
75.0000 ug | ORAL_TABLET | Freq: Every day | ORAL | 3 refills | Status: DC
Start: 2023-12-26 — End: 2024-04-13

## 2023-12-26 MED ORDER — CLONIDINE HCL 0.1 MG PO TABS: 0.1000 mg | ORAL_TABLET | Freq: Two times a day (BID) | ORAL | 3 refills | Status: DC

## 2023-12-26 NOTE — Telephone Encounter (Signed)
Patient presents to clinic to pick up medication.   Provided with medication per note from [place].   Veronda Prude, RN

## 2023-12-27 ENCOUNTER — Telehealth (INDEPENDENT_AMBULATORY_CARE_PROVIDER_SITE_OTHER): Payer: Self-pay | Admitting: Pharmacist

## 2023-12-27 DIAGNOSIS — Z0000 Encounter for general adult medical examination without abnormal findings: Secondary | ICD-10-CM

## 2023-12-27 NOTE — Telephone Encounter (Signed)
 Patient contacts office to report he recently picked up 6 sensors and he had 4 sensors fail to stick.  Patient requests assistance with CGM (continuous glucose monitor) supply.  We discussed failure and he reported that the sensors simply failed to stick.  I provide him the [facility] service number and asked him to contact them - inquiring about replacement supply.  Number provided:  [phone]  Patient was informed if the company failed to supply replacements, he should contact our office again and we would try to help including possibly reviewing application process again with patient.   Total time with patient call and documentation of interaction: 9 minutes.

## 2023-12-27 NOTE — Telephone Encounter (Signed)
 Reviewed and agree with Dr Macky Lower plan.

## 2023-12-31 ENCOUNTER — Ambulatory Visit (INDEPENDENT_AMBULATORY_CARE_PROVIDER_SITE_OTHER): Admitting: Family Medicine

## 2023-12-31 ENCOUNTER — Encounter: Payer: Self-pay | Admitting: Family Medicine

## 2023-12-31 VITALS — BP 134/64 | HR 72 | Ht 73.0 in | Wt 202.0 lb

## 2023-12-31 DIAGNOSIS — E039 Hypothyroidism, unspecified: Secondary | ICD-10-CM

## 2023-12-31 DIAGNOSIS — Z794 Long term (current) use of insulin: Secondary | ICD-10-CM

## 2023-12-31 DIAGNOSIS — E119 Type 2 diabetes mellitus without complications: Secondary | ICD-10-CM | POA: Diagnosis not present

## 2023-12-31 LAB — POCT GLYCOSYLATED HEMOGLOBIN (HGB A1C): HbA1c, POC (controlled diabetic range): 7.5 % — AB (ref 0.0–7.0)

## 2023-12-31 MED ORDER — SEMAGLUTIDE(0.25 OR 0.5MG/DOS) 2 MG/1.5ML ~~LOC~~ SOPN: 0.2500 mg | PEN_INJECTOR | SUBCUTANEOUS | 3 refills | Status: DC

## 2023-12-31 NOTE — Patient Instructions (Signed)
 Good to see you today - Thank you for coming in  Things we discussed today:  1) Your A1c is slightly going up to 7.5 today. Your sugar is much better than they were before, but we can still get them even better. - Start taking ozempic  0.25mg  once week.  - Take Lantus  12units once a day - Check your sugars every morning before you eat food. If your sugars are above 120, increase your insulin  by 1 units.   - Do NOT increase to more than 18 units before seeing me  2) We will check your thyroid  levels today.  Come back to see me in 6-8 weeks and we will review your sugars together.

## 2023-12-31 NOTE — Progress Notes (Signed)
    SUBJECTIVE:   CHIEF COMPLAINT / HPI:    SL is a 68yo M w/ hx of T2DM, Hypothyroidism that pf T2DM f/u. - A1c slightly uptrending - Not currently taking ozempic  anymore. His BG was improving so he thought he didn't need it anymore.  - Taking 18u insulin  every morning.   Reviewed CGM log:  - Spend 66% of time at goal - 33% of time above goal - No hypoglycemic readings    OBJECTIVE:   BP 134/64   Pulse 72   Ht 6' 1 (1.854 m)   Wt 202 lb (91.6 kg)   SpO2 94%   BMI 26.65 kg/m   General: Alert, pleasant man. NAD. HEENT: NCAT. MMM. CV: RRR, no murmurs.  Resp: CTAB, no wheezing or crackles. Normal WOB on RA.  Abm: Soft, nontender, nondistended. BS present. Ext: Moves all ext spontaneously Skin: Warm, well perfused   ASSESSMENT/PLAN:   Assessment & Plan Type 2 diabetes mellitus without complication, with long-term current use of insulin  (HCC) A1c above goal, suspect due to stopping ozempic .CGM reviewed, no hypoglycemic episodes. Will restart ozempic  and decrease insulin . (See below) - f/u in 6-8 weeks Hypothyroidism, unspecified type Check TSH today   Patient Instructions  Good to see you today - Thank you for coming in  Things we discussed today:  1) Your A1c is slightly going up to 7.5 today. Your sugar is much better than they were before, but we can still get them even better. - Start taking ozempic  0.25mg  once week.  - Take Lantus  12units once a day - Check your sugars every morning before you eat food. If your sugars are above 120, increase your insulin  by 1 units.   - Do NOT increase to more than 18 units before seeing me  2) We will check your thyroid  levels today.  Come back to see me in 6-8 weeks and we will review your sugars together.     Twyla Nearing, MD [facility]

## 2024-01-01 LAB — TSH: TSH: 1.25 u[IU]/mL (ref 0.450–4.500)

## 2024-01-02 NOTE — Assessment & Plan Note (Signed)
 A1c above goal, suspect due to stopping ozempic .CGM reviewed, no hypoglycemic episodes. Will restart ozempic  and decrease insulin . (See below) - f/u in 6-8 weeks

## 2024-01-02 NOTE — Assessment & Plan Note (Signed)
 Check TSH today

## 2024-01-03 ENCOUNTER — Ambulatory Visit (INDEPENDENT_AMBULATORY_CARE_PROVIDER_SITE_OTHER)

## 2024-01-03 ENCOUNTER — Ambulatory Visit (INDEPENDENT_AMBULATORY_CARE_PROVIDER_SITE_OTHER): Admitting: Podiatry

## 2024-01-03 DIAGNOSIS — E119 Type 2 diabetes mellitus without complications: Secondary | ICD-10-CM | POA: Diagnosis not present

## 2024-01-03 DIAGNOSIS — M86172 Other acute osteomyelitis, left ankle and foot: Secondary | ICD-10-CM

## 2024-01-03 DIAGNOSIS — Z794 Long term (current) use of insulin: Secondary | ICD-10-CM

## 2024-01-03 DIAGNOSIS — L97522 Non-pressure chronic ulcer of other part of left foot with fat layer exposed: Secondary | ICD-10-CM | POA: Diagnosis not present

## 2024-01-03 MED ORDER — DOXYCYCLINE HYCLATE 100 MG PO TABS: 100.0000 mg | ORAL_TABLET | Freq: Two times a day (BID) | ORAL | 0 refills | Status: DC

## 2024-01-03 NOTE — Progress Notes (Signed)
 Subjective:  Patient ID: Andrew [name], male    DOB: Jul 24, 1955,  MRN: [id number]  Chief Complaint  Patient presents with   Foot Ulcer    3 week follow up for left foot ulcer  Pt stated that he is doing well he has no concerns at this time     68 y.o. male presents with the above complaint.  Patient presents with left first metatarsophalangeal joint ulceration.  Patient states that it is painful just came out of nowhere.  He does have chronic kidney disease.  He does have diabetes as well.  He currently does not take any antibiotics wanted discuss treatment options for this.   Review of Systems: Negative except as noted in the HPI. Denies N/V/F/Ch.  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   Qualifier: Diagnosis of  By: VASQUEZ, GRISELDA     ERECTILE DYSFUNCTION 10/18/2006   Qualifier: Diagnosis of  By: VASQUEZ, GRISELDA     HAMMER TOE 07/25/2010   HLD (hyperlipidemia)    HTN (hypertension)    Hypothyroidism    Leg swelling    Lipoma of back 03/18/2011   Personal history of colonic adenomas 04/01/2013   Primary osteoarthritis of right knee 05/21/2018   Prostatitis    hx of x2   PSEUDOFOLLICULITIS BARBAE 03/10/2008   Qualifier: Diagnosis of  By: Rilla  MD, Anton     Sarcoidosis    Status post right knee replacement 05/15/2017   T2DM (type 2 diabetes mellitus) (HCC)     Current Outpatient Medications:    doxycycline  (VIBRA -TABS) 100 MG tablet, Take 1 tablet (100 mg total) by mouth 2 (two) times daily., Disp: 20 tablet, Rfl: 0   Accu-Chek Softclix Lancets lancets, USE TO TEST BLOOD SUGAR 5 TIMES  DAILY (Patient not taking: Reported on 09/30/2023),  Disp: 500 each, Rfl: 2   Acetaminophen  (TYLENOL  ARTHRITIS EXT RELIEF PO), Take 650 mg by mouth 1 day or 1 dose., Disp: , Rfl:    allopurinol  (ZYLOPRIM ) 100 MG tablet, Take 1 tablet (100 mg total) by mouth daily., Disp: 90 tablet, Rfl: 3   amLODipine  (NORVASC ) 10 MG tablet, Take 1 tablet (10 mg total) by mouth daily., Disp: 100 tablet, Rfl: 2   aspirin  EC 81 MG tablet, Take 1 tablet (81 mg total) by mouth daily., Disp: 90 tablet, Rfl: 2   Blood Glucose Monitoring Suppl (ACCU-CHEK GUIDE) w/Device KIT, Use to check blood sugar 3x per day. E11.9 (Patient not taking: Reported on 09/30/2023), Disp: 1 kit, Rfl: 0   cloNIDine  (CATAPRES ) 0.1 MG tablet, Take 1 tablet (0.1 mg total) by mouth 2 (two) times daily., Disp: 180 tablet, Rfl: 3   Continuous Glucose Sensor (FREESTYLE LIBRE 3 PLUS SENSOR) MISC, Change sensor every 15 days., Disp: 2 each, Rfl: 11   dapagliflozin  propanediol (FARXIGA ) 10 MG TABS tablet, Take 1 tablet (10 mg total) by mouth daily., Disp: 90 tablet, Rfl: 3   doxycycline  (VIBRA -TABS) 100 MG tablet, Take 1 tablet (100 mg total) by mouth 2 (two) times daily., Disp: 28 tablet, Rfl: 0   fluticasone  (FLONASE ) 50 MCG/ACT nasal spray, SPRAY 2 SPRAYS INTO EACH NOSTRIL  EVERY DAY, Disp: 48 mL, Rfl: 2   gabapentin  (NEURONTIN ) 100 MG capsule, Take 1 capsule (100 mg total) by mouth 3 (three) times daily as needed., Disp: 90 capsule, Rfl: 3   glucose blood (ACCU-CHEK GUIDE) test strip, USE TO CHECK BLOOD SUGAR 5 TIMES DAILY (Patient not taking: Reported on 09/30/2023), Disp: 500 strip, Rfl: 2   insulin  glargine (LANTUS  SOLOSTAR) 100 UNIT/ML Solostar Pen, Inject 18 Units into the skin daily., Disp: 15 mL, Rfl: 6   Insulin  Pen Needle 32G X 4 MM MISC, 1 Container by Does not apply route as needed., Disp: 100 each, Rfl: 11   latanoprost (XALATAN) 0.005 % ophthalmic solution, Place 1 drop into both eyes at bedtime., Disp: , Rfl:    levothyroxine  (SYNTHROID ) 75 MCG tablet, Take 1 tablet (75 mcg total) by mouth  daily before breakfast. Take 1 tablet (75 mcg total) by mouth daily before breakfast, Disp: 90 tablet, Rfl: 3   losartan  (COZAAR ) 100 MG tablet, Take 1 tablet (100 mg total) by mouth daily., Disp: 90 tablet, Rfl: 3   meloxicam  (MOBIC ) 15 MG tablet, TAKE 1 TABLET BY MOUTH DAILY, Disp: 100 tablet, Rfl: 2   metFORMIN  (GLUCOPHAGE -XR) 500 MG 24 hr tablet, Take 2 tablets (1,000 mg total) by mouth 2 (two) times daily., Disp: 400 tablet, Rfl: 2   rosuvastatin  (CRESTOR ) 20 MG tablet, Take 1 tablet (20 mg total) by mouth daily., Disp: 90 tablet, Rfl: 3   Semaglutide ,0.25 or 0.5MG /DOS, 2 MG/1.5ML SOPN, Inject 0.25 mg into the skin once a week. 0.25 mg once weekly for 4 weeks then increase to 0.5 mg weekly for at least 4 weeks,max 1 mg, Disp: 3 mL, Rfl: 3   tadalafil  (CIALIS ) 10 MG tablet, Take 1 tablet (10 mg total) by mouth daily., Disp: 90 tablet, Rfl: 3   tamsulosin  (FLOMAX ) 0.4 MG CAPS capsule, Take 2 capsules (0.8 mg total) by mouth daily., Disp: 180 capsule, Rfl: 3   Turmeric POWD, Take 5 mLs by mouth daily. Mixes 1 teaspoon in water every day and drinks it. Uses for arthritis. (Patient not taking: Reported on 09/30/2023), Disp: , Rfl:   Social History   Tobacco Use  Smoking Status Never   Passive exposure: Never  Smokeless Tobacco Never  Tobacco Comments   Never smoker; no plans to start    Allergies  Allergen Reactions   Metoprolol Succinate Other (See Comments)    Bradycardia (HR to 42)   Penicillins Itching    Has patient had a PCN reaction causing immediate rash, facial/tongue/throat swelling, SOB or lightheadedness with hypotension: No Has patient had a PCN reaction causing severe rash involving mucus membranes or skin necrosis: No Has patient had a PCN reaction that required hospitalization No Has patient had a PCN reaction occurring within the last 10 years: No If all of the above answers are NO, then may proceed with Cephalosporin use.   Empagliflozin  Other (See Comments)    Dizzy  , Dysphoria,  Patient preference to avoid use.    Objective:  There were no vitals filed for this visit. There is no height or weight on file to calculate BMI. Constitutional Well developed. Well nourished.  Vascular Dorsalis pedis pulses palpable bilaterally. Posterior tibial pulses palpable bilaterally. Capillary refill normal to all digits.  No cyanosis or clubbing noted. Pedal hair growth normal.  Neurologic Normal speech. Oriented to person, place, and time. Epicritic sensation to light touch grossly present bilaterally.  Dermatologic Left first metatarsophalangeal joint ulceration with fat layer exposed.  Does not probe down to deep tissue.  No purulent drainage noted.  Serosanguineous fluid expressed.  No other clinical signs of infection noted no cellulitis noted.  Orthopedic: Normal joint ROM without pain or crepitus bilaterally. No visible deformities. No bony tenderness.   Radiographs: None Assessment:   1. 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 Type 2 diabetes mellitus without complication, with long-term current use of insulin  (HCC)   3. Osteomyelitis of foot, left, acute (HCC)    Plan:  Patient was evaluated and treated and all questions answered.  Left first metatarsophalangeal joint ulceration with fat layer exposed - All questions and concerns were discussed with the patient in extensive detail - Patient given that he is a diabetic he is at high risk of undergoing amputation - Continue doing doxycycline .  Given the findings of osteomyelitis I believe patient will benefit from IV antibiotics.  At this time clinically does not have any concern for other signs of infection.  There is no purulent drainage there is no erythema no malodor present.  Patient will benefit from IV Dalvance.  I will work on the approval for that. -Patient is a high risk for undergoing internal amputation. - He does have osteomyelitic changes to the head of the first  metatarsal head without any other clinical signs of infection.  Will continue to monitor him closely with outpatient IV antibiotics.

## 2024-01-15 ENCOUNTER — Telehealth: Payer: Self-pay

## 2024-01-15 ENCOUNTER — Other Ambulatory Visit (HOSPITAL_COMMUNITY): Payer: Self-pay

## 2024-01-15 NOTE — Telephone Encounter (Signed)
 Rec'd 4 boxes of ozempic  1mg  dose pens 01/14/24

## 2024-01-21 ENCOUNTER — Telehealth: Payer: Self-pay | Admitting: Podiatry

## 2024-01-21 ENCOUNTER — Other Ambulatory Visit: Payer: Self-pay | Admitting: Family Medicine

## 2024-01-21 DIAGNOSIS — I10 Essential (primary) hypertension: Secondary | ICD-10-CM

## 2024-01-21 DIAGNOSIS — E119 Type 2 diabetes mellitus without complications: Secondary | ICD-10-CM

## 2024-01-21 DIAGNOSIS — N401 Enlarged prostate with lower urinary tract symptoms: Secondary | ICD-10-CM

## 2024-01-21 MED ORDER — DOXYCYCLINE HYCLATE 100 MG PO TABS: 100.0000 mg | ORAL_TABLET | Freq: Two times a day (BID) | ORAL | 0 refills | Status: AC

## 2024-01-21 NOTE — Telephone Encounter (Signed)
 The patient is requesting an alternative antibiotic in place of Dalvance (IV antibiotic). Please send the new [facility].

## 2024-01-22 ENCOUNTER — Other Ambulatory Visit: Payer: Self-pay | Admitting: Student

## 2024-01-22 MED ORDER — LANTUS SOLOSTAR 100 UNIT/ML ~~LOC~~ SOPN: 12.0000 [IU] | PEN_INJECTOR | Freq: Every day | SUBCUTANEOUS | 6 refills | Status: DC

## 2024-01-22 NOTE — Progress Notes (Signed)
Insulin refill

## 2024-01-24 ENCOUNTER — Ambulatory Visit: Admitting: Podiatry

## 2024-01-29 ENCOUNTER — Other Ambulatory Visit: Payer: Self-pay

## 2024-01-29 MED ORDER — ACCU-CHEK GUIDE TEST VI STRP: ORAL_STRIP | 12 refills | Status: AC

## 2024-01-29 MED ORDER — ACCU-CHEK SOFTCLIX LANCETS MISC: 2 refills | Status: AC

## 2024-01-29 MED ORDER — ACCU-CHEK GUIDE W/DEVICE KIT: PACK | 0 refills | Status: AC

## 2024-01-29 NOTE — Telephone Encounter (Signed)
 Patient calls nurse line regarding glucometer. He states that current glucometer is not working.   Pended glucometer and supplies to this encounter.   [facility] sent to [facility].   Chiquita JAYSON English, RN

## 2024-02-03 ENCOUNTER — Other Ambulatory Visit: Payer: Self-pay | Admitting: Podiatry

## 2024-02-04 ENCOUNTER — Telehealth: Payer: Self-pay | Admitting: Podiatry

## 2024-02-04 MED ORDER — DOXYCYCLINE HYCLATE 100 MG PO TABS: 100.0000 mg | ORAL_TABLET | Freq: Two times a day (BID) | ORAL | 0 refills | Status: DC

## 2024-02-04 NOTE — Telephone Encounter (Signed)
 Patient called in stating that his toes are swollen. They went down some since last appointment but are currently swollen. He is asking for doxycycline  refill . But do you think he should schedule an appointment?

## 2024-02-14 ENCOUNTER — Ambulatory Visit: Admitting: Podiatry

## 2024-02-14 DIAGNOSIS — Z794 Long term (current) use of insulin: Secondary | ICD-10-CM

## 2024-02-14 DIAGNOSIS — Z01818 Encounter for other preprocedural examination: Secondary | ICD-10-CM | POA: Diagnosis not present

## 2024-02-14 DIAGNOSIS — E119 Type 2 diabetes mellitus without complications: Secondary | ICD-10-CM | POA: Diagnosis not present

## 2024-02-14 DIAGNOSIS — L97522 Non-pressure chronic ulcer of other part of left foot with fat layer exposed: Secondary | ICD-10-CM

## 2024-02-14 MED ORDER — DOXYCYCLINE HYCLATE 100 MG PO TABS: 100.0000 mg | ORAL_TABLET | Freq: Two times a day (BID) | ORAL | 0 refills | Status: DC

## 2024-02-14 MED ORDER — SILVER SULFADIAZINE 1 % EX CREA: 1.0000 | TOPICAL_CREAM | Freq: Every day | CUTANEOUS | 0 refills | Status: AC

## 2024-02-14 NOTE — Progress Notes (Signed)
 Subjective:  Patient ID: Andrew [name], male    DOB: 1956/01/21,  MRN: [id number]  Chief Complaint  Patient presents with   Foot Ulcer    Left foot ulcer on great toe  Pt stated that he has no concerns at this time     68 y.o. male presents with the above complaint.  Patient presents with left first metatarsophalangeal joint ulceration.  He states is doing okay no signs of infection no nausea fever chills vomiting.  No pus pouring out.   Review of Systems: Negative except as noted in the HPI. Denies N/V/F/Ch.  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   Qualifier: Diagnosis of  By: VASQUEZ, GRISELDA     ERECTILE DYSFUNCTION 10/18/2006   Qualifier: Diagnosis of  By: VASQUEZ, GRISELDA     HAMMER TOE 07/25/2010   HLD (hyperlipidemia)    HTN (hypertension)    Hypothyroidism    Leg swelling    Lipoma of back 03/18/2011   Personal history of colonic adenomas 04/01/2013   Primary osteoarthritis of right knee 05/21/2018   Prostatitis    hx of x2   PSEUDOFOLLICULITIS BARBAE 03/10/2008   Qualifier: Diagnosis of  By: Rilla  MD, Anton     Sarcoidosis    Status post right knee replacement 05/15/2017   T2DM (type 2 diabetes mellitus) (HCC)     Current Outpatient Medications:    doxycycline  (VIBRA -TABS) 100 MG tablet, Take 1 tablet (100 mg total) by mouth 2 (two) times daily., Disp: 60 tablet, Rfl: 0   silver  sulfADIAZINE  (SILVADENE ) 1 % cream, Apply 1 Application topically daily., Disp: 50 g, Rfl: 0   Accu-Chek Softclix Lancets lancets, USE TO TEST BLOOD SUGAR 3 TIMES  DAILY, Disp: 200 each, Rfl: 2   Acetaminophen  (TYLENOL  ARTHRITIS EXT RELIEF PO), Take 650  mg by mouth 1 day or 1 dose., Disp: , Rfl:    allopurinol  (ZYLOPRIM ) 100 MG tablet, Take 1 tablet (100 mg total) by mouth daily., Disp: 90 tablet, Rfl: 3   amLODipine  (NORVASC ) 10 MG tablet, Take 1 tablet (10 mg total) by mouth daily., Disp: 100 tablet, Rfl: 2   aspirin  EC 81 MG tablet, Take 1 tablet (81 mg total) by mouth daily., Disp: 90 tablet, Rfl: 2   Blood Glucose Monitoring Suppl (ACCU-CHEK GUIDE) w/Device KIT, Use to check blood sugar 3x per day. E11.9, Disp: 1 kit, Rfl: 0   cloNIDine  (CATAPRES ) 0.1 MG tablet, Take 1 tablet (0.1 mg total) by mouth 2 (two) times daily., Disp: 180 tablet, Rfl: 3   Continuous Glucose Sensor (FREESTYLE LIBRE 3 PLUS SENSOR) MISC, Change sensor every 15 days., Disp: 2 each, Rfl: 11   dapagliflozin  propanediol (FARXIGA ) 10 MG TABS tablet, Take 1 tablet (10 mg total) by mouth daily., Disp: 90 tablet, Rfl: 3   doxycycline  (VIBRA -TABS) 100 MG tablet, Take 1 tablet (100 mg total) by mouth 2 (two) times daily., Disp: 28 tablet, Rfl: 0   doxycycline  (VIBRA -TABS) 100 MG tablet, Take 1 tablet (100 mg total) by mouth 2 (two) times daily., Disp: 20 tablet, Rfl: 0   doxycycline  (VIBRA -TABS) 100 MG tablet, Take 1 tablet (  100 mg total) by mouth 2 (two) times daily., Disp: 60 tablet, Rfl: 0   doxycycline  (VIBRA -TABS) 100 MG tablet, Take 1 tablet (100 mg total) by mouth 2 (two) times daily., Disp: 20 tablet, Rfl: 0   fluticasone  (FLONASE ) 50 MCG/ACT nasal spray, SPRAY 2 SPRAYS INTO EACH NOSTRIL EVERY DAY, Disp: 48 mL, Rfl: 2   gabapentin  (NEURONTIN ) 100 MG capsule, Take 1 capsule (100 mg total) by mouth 3 (three) times daily as needed., Disp: 90 capsule, Rfl: 3   glucose blood (ACCU-CHEK GUIDE TEST) test strip, Please use to check blood sugar levels up to 3 times per day, Disp: 100 each, Rfl: 12   insulin  glargine (LANTUS  SOLOSTAR) 100 UNIT/ML Solostar Pen, Inject 12 Units into the skin daily., Disp: 15 mL, Rfl: 6   Insulin  Pen Needle 32G X 4 MM MISC, 1 Container by Does not apply  route as needed., Disp: 100 each, Rfl: 11   latanoprost (XALATAN) 0.005 % ophthalmic solution, Place 1 drop into both eyes at bedtime., Disp: , Rfl:    levothyroxine  (SYNTHROID ) 75 MCG tablet, Take 1 tablet (75 mcg total) by mouth daily before breakfast. Take 1 tablet (75 mcg total) by mouth daily before breakfast, Disp: 90 tablet, Rfl: 3   losartan  (COZAAR ) 100 MG tablet, TAKE 1 TABLET BY MOUTH EVERY DAY, Disp: 90 tablet, Rfl: 3   meloxicam  (MOBIC ) 15 MG tablet, TAKE 1 TABLET BY MOUTH DAILY, Disp: 100 tablet, Rfl: 2   metFORMIN  (GLUCOPHAGE -XR) 500 MG 24 hr tablet, Take 2 tablets (1,000 mg total) by mouth 2 (two) times daily., Disp: 400 tablet, Rfl: 2   rosuvastatin  (CRESTOR ) 20 MG tablet, Take 1 tablet (20 mg total) by mouth daily., Disp: 90 tablet, Rfl: 3   Semaglutide ,0.25 or 0.5MG /DOS, 2 MG/1.5ML SOPN, Inject 0.25 mg into the skin once a week. 0.25 mg once weekly for 4 weeks then increase to 0.5 mg weekly for at least 4 weeks,max 1 mg, Disp: 3 mL, Rfl: 3   tadalafil  (CIALIS ) 10 MG tablet, Take 1 tablet (10 mg total) by mouth daily., Disp: 90 tablet, Rfl: 3   tamsulosin  (FLOMAX ) 0.4 MG CAPS capsule, TAKE 2 CAPSULES BY MOUTH EVERY DAY, Disp: 180 capsule, Rfl: 3   Turmeric POWD, Take 5 mLs by mouth daily. Mixes 1 teaspoon in water every day and drinks it. Uses for arthritis. (Patient not taking: Reported on 09/30/2023), Disp: , Rfl:   Social History   Tobacco Use  Smoking Status Never   Passive exposure: Never  Smokeless Tobacco Never  Tobacco Comments   Never smoker; no plans to start    Allergies  Allergen Reactions   Metoprolol Succinate Other (See Comments)    Bradycardia (HR to 42)   Penicillins Itching    Has patient had a PCN reaction causing immediate rash, facial/tongue/throat swelling, SOB or lightheadedness with hypotension: No Has patient had a PCN reaction causing severe rash involving mucus membranes or skin necrosis: No Has patient had a PCN reaction that required  hospitalization No Has patient had a PCN reaction occurring within the last 10 years: No If all of the above answers are NO, then may proceed with Cephalosporin use.   Empagliflozin  Other (See Comments)    Dizzy , Dysphoria,  Patient preference to avoid use.    Objective:  There were no vitals filed for this visit. There is no height or weight on file to calculate BMI. Constitutional Well developed. Well nourished.  Vascular Dorsalis pedis pulses palpable bilaterally. Posterior tibial pulses  palpable bilaterally. Capillary refill normal to all digits.  No cyanosis or clubbing noted. Pedal hair growth normal.  Neurologic Normal speech. Oriented to person, place, and time. Epicritic sensation to light touch grossly present bilaterally.  Dermatologic Left first metatarsophalangeal joint ulceration with fat layer exposed.  No probing down to deep tissue/bone.  No purulent drainage noted.  Serosanguineous fluid expressed.  No other clinical signs of infection noted no cellulitis noted.  Orthopedic: Normal joint ROM without pain or crepitus bilaterally. No visible deformities. No bony tenderness.   Radiographs: None Assessment:   1. 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 Type 2 diabetes mellitus without complication, with long-term current use of insulin  (HCC)   3. Encounter for preoperative examination for general surgical procedure     Plan:  Patient was evaluated and treated and all questions answered.  Left first metatarsophalangeal joint ulceration with fat layer exposed - All questions and concerns were discussed with the patient in extensive detail - Patient given that he is a diabetic he is at high risk of undergoing amputation - Given that patient is continually having the wound as well as probing down to deep tissue/bone with underlying osteomyelitic changes even though stable patient will benefit from left first metatarsophalangeal joint resection with  antibiotic spacer/bone submitted.  I discussed my preoperative entire postoperative plan with the patient in extensive detail he states understand like to proceed with that.  I will keep him on antibiotics until surgery.  He states understanding. -Informed surgical risk consent was reviewed and read aloud to the patient.  I reviewed the films.  I have discussed my findings with the patient in great detail.  I have discussed all risks including but not limited to infection, stiffness, scarring, limp, disability, deformity, damage to blood vessels and nerves, numbness, poor healing, need for braces, arthritis, chronic pain, amputation, death.  All benefits and realistic expectations discussed in great detail.  I have made no promises as to the outcome.  I have provided realistic expectations.  I have offered the patient a 2nd opinion, which they have declined and assured me they preferred to proceed despite the risks

## 2024-02-20 ENCOUNTER — Telehealth: Payer: Self-pay | Admitting: Podiatry

## 2024-02-20 NOTE — Telephone Encounter (Signed)
 Received surgical consent form   Left message for pt to call to get the surgery scheduled.

## 2024-02-26 ENCOUNTER — Telehealth: Payer: Self-pay | Admitting: *Deleted

## 2024-02-26 NOTE — Telephone Encounter (Signed)
 Patient had a call from our office and was calling back.   I see that Dawn called to try and schedule his surgery and L/M. I will forward this message to [facility]

## 2024-03-03 ENCOUNTER — Telehealth: Payer: Self-pay | Admitting: Lab

## 2024-03-03 NOTE — Telephone Encounter (Signed)
 Patient left message stating has upcoming surgery and doesn't want bone shaved down please contact patient he stated wants to speak to provider [phone].

## 2024-03-05 ENCOUNTER — Telehealth: Payer: Self-pay | Admitting: Podiatry

## 2024-03-05 NOTE — Telephone Encounter (Signed)
 DOS- 03/30/2024  1ST METATARSAL METATARSECTOMY LT- 28140  [employer] EFFECTIVE DATE- 12/08/2023  DEDUCTIBLE- $0 REMAINING- $0 OOP- $3900 REMAINING- $3824.82 COINSURANCE- 0%/$295 COPAY  PER [employer] WEBSITE, NO PRIOR AUTH IS REQUIRED FOR CPT CODE 71859. DECISION ID# [id number]

## 2024-03-05 NOTE — Telephone Encounter (Signed)
 Called pt per Dr Tobie as pt wanted to get his surgery scheduled  Pt was already scheduled for 03/30/24 and I did contact him and discuss further. He has misplaced the [facility] pamphlet and is going to stop by the office to pick up a new one.

## 2024-03-18 ENCOUNTER — Ambulatory Visit: Admitting: Podiatry

## 2024-03-18 ENCOUNTER — Telehealth: Payer: Self-pay | Admitting: Podiatry

## 2024-03-18 NOTE — Telephone Encounter (Signed)
 Pt was scheduled to be seen today for an ulcer check and was scheduled for surgery on 9/22. He was r/s for the ulcer check from today to 10/3 and wants to cancel his surgery. He wants to see if there are any other options.   I r/s him for 9/12 this Friday since it was an ulcer check. He did not need to wait until 10/3.  I notified cynthia at [facility] to cancel surgery.

## 2024-03-19 ENCOUNTER — Telehealth: Payer: Self-pay

## 2024-03-19 NOTE — Telephone Encounter (Signed)
 PA request received for Dalvance 500mg  solution. I went to submit through [facility] and it had already been approved.

## 2024-03-20 ENCOUNTER — Telehealth (HOSPITAL_COMMUNITY): Payer: Self-pay | Admitting: Pharmacy Technician

## 2024-03-20 ENCOUNTER — Other Ambulatory Visit (HOSPITAL_COMMUNITY): Payer: Self-pay | Admitting: Podiatry

## 2024-03-20 ENCOUNTER — Ambulatory Visit (INDEPENDENT_AMBULATORY_CARE_PROVIDER_SITE_OTHER): Admitting: Podiatry

## 2024-03-20 DIAGNOSIS — M86179 Other acute osteomyelitis, unspecified ankle and foot: Secondary | ICD-10-CM | POA: Insufficient documentation

## 2024-03-20 DIAGNOSIS — Z794 Long term (current) use of insulin: Secondary | ICD-10-CM

## 2024-03-20 DIAGNOSIS — E119 Type 2 diabetes mellitus without complications: Secondary | ICD-10-CM

## 2024-03-20 DIAGNOSIS — M86172 Other acute osteomyelitis, left ankle and foot: Secondary | ICD-10-CM | POA: Diagnosis not present

## 2024-03-20 NOTE — Progress Notes (Signed)
 Subjective:  Patient ID: Andrew [name], male    DOB: [birth year],  MRN: [id number]  Chief Complaint  Patient presents with   Foot Ulcer    68 y.o. male presents with the above complaint.  Patient presents with left first metatarsophalangeal joint ulceration.  He states he does not want any surgery.  He would like to just focus on antibiotics and local wound care for now.  Denies any other acute complaints..   Review of Systems: Negative except as noted in the HPI. Denies N/V/F/Ch.  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   Qualifier: Diagnosis of  By: VASQUEZ, GRISELDA     ERECTILE DYSFUNCTION 10/18/2006   Qualifier: Diagnosis of  By: VASQUEZ, GRISELDA     HAMMER TOE 07/25/2010   HLD (hyperlipidemia)    HTN (hypertension)    Hypothyroidism    Leg swelling    Lipoma of back 03/18/2011   Personal history of colonic adenomas 04/01/2013   Primary osteoarthritis of right knee 05/21/2018   Prostatitis    hx of x2   PSEUDOFOLLICULITIS BARBAE 03/10/2008   Qualifier: Diagnosis of  By: Rilla  MD, Anton     Sarcoidosis    Status post right knee replacement 05/15/2017   T2DM (type 2 diabetes mellitus) (HCC)     Current Outpatient Medications:    Accu-Chek Softclix Lancets lancets, USE TO TEST BLOOD SUGAR 3 TIMES  DAILY, Disp: 200 each, Rfl: 2   Acetaminophen  (TYLENOL  ARTHRITIS EXT RELIEF PO), Take 650 mg by mouth 1 day or 1 dose., Disp: , Rfl:    allopurinol  (ZYLOPRIM ) 100 MG tablet, Take 1 tablet (100 mg total) by mouth daily., Disp: 90 tablet, Rfl: 3   amLODipine  (NORVASC ) 10 MG tablet, Take 1 tablet (10 mg total) by mouth daily., Disp: 100 tablet, Rfl:  2   aspirin  EC 81 MG tablet, Take 1 tablet (81 mg total) by mouth daily., Disp: 90 tablet, Rfl: 2   Blood Glucose Monitoring Suppl (ACCU-CHEK GUIDE) w/Device KIT, Use to check blood sugar 3x per day. E11.9, Disp: 1 kit, Rfl: 0   cloNIDine  (CATAPRES ) 0.1 MG tablet, Take 1 tablet (0.1 mg total) by mouth 2 (two) times daily., Disp: 180 tablet, Rfl: 3   Continuous Glucose Sensor (FREESTYLE LIBRE 3 PLUS SENSOR) MISC, Change sensor every 15 days., Disp: 2 each, Rfl: 11   dapagliflozin  propanediol (FARXIGA ) 10 MG TABS tablet, Take 1 tablet (10 mg total) by mouth daily., Disp: 90 tablet, Rfl: 3   doxycycline  (VIBRA -TABS) 100 MG tablet, Take 1 tablet (100 mg total) by mouth 2 (two) times daily., Disp: 28 tablet, Rfl: 0   doxycycline  (VIBRA -TABS) 100 MG tablet, Take 1 tablet (100 mg total) by mouth 2 (two) times daily., Disp: 20 tablet, Rfl: 0   doxycycline  (VIBRA -TABS) 100 MG tablet, Take 1 tablet (100 mg total) by mouth 2 (two) times daily., Disp: 60 tablet, Rfl: 0   doxycycline  (VIBRA -TABS) 100 MG tablet, Take 1 tablet (100 mg total) by mouth 2 (two) times daily., Disp: 20 tablet, Rfl: 0   doxycycline  (VIBRA -TABS) 100 MG tablet, Take 1 tablet (100 mg total) by mouth  2 (two) times daily., Disp: 60 tablet, Rfl: 0   fluticasone  (FLONASE ) 50 MCG/ACT nasal spray, SPRAY 2 SPRAYS INTO EACH NOSTRIL EVERY DAY, Disp: 48 mL, Rfl: 2   gabapentin  (NEURONTIN ) 100 MG capsule, Take 1 capsule (100 mg total) by mouth 3 (three) times daily as needed., Disp: 90 capsule, Rfl: 3   glucose blood (ACCU-CHEK GUIDE TEST) test strip, Please use to check blood sugar levels up to 3 times per day, Disp: 100 each, Rfl: 12   insulin  glargine (LANTUS  SOLOSTAR) 100 UNIT/ML Solostar Pen, Inject 12 Units into the skin daily., Disp: 15 mL, Rfl: 6   Insulin  Pen Needle 32G X 4 MM MISC, 1 Container by Does not apply route as needed., Disp: 100 each, Rfl: 11   latanoprost (XALATAN) 0.005 % ophthalmic solution, Place 1 drop into both eyes at  bedtime., Disp: , Rfl:    levothyroxine  (SYNTHROID ) 75 MCG tablet, Take 1 tablet (75 mcg total) by mouth daily before breakfast. Take 1 tablet (75 mcg total) by mouth daily before breakfast, Disp: 90 tablet, Rfl: 3   losartan  (COZAAR ) 100 MG tablet, TAKE 1 TABLET BY MOUTH EVERY DAY, Disp: 90 tablet, Rfl: 3   meloxicam  (MOBIC ) 15 MG tablet, TAKE 1 TABLET BY MOUTH DAILY, Disp: 100 tablet, Rfl: 2   metFORMIN  (GLUCOPHAGE -XR) 500 MG 24 hr tablet, Take 2 tablets (1,000 mg total) by mouth 2 (two) times daily., Disp: 400 tablet, Rfl: 2   rosuvastatin  (CRESTOR ) 20 MG tablet, Take 1 tablet (20 mg total) by mouth daily., Disp: 90 tablet, Rfl: 3   Semaglutide ,0.25 or 0.5MG /DOS, 2 MG/1.5ML SOPN, Inject 0.25 mg into the skin once a week. 0.25 mg once weekly for 4 weeks then increase to 0.5 mg weekly for at least 4 weeks,max 1 mg, Disp: 3 mL, Rfl: 3   silver  sulfADIAZINE  (SILVADENE ) 1 % cream, Apply 1 Application topically daily., Disp: 50 g, Rfl: 0   tadalafil  (CIALIS ) 10 MG tablet, Take 1 tablet (10 mg total) by mouth daily., Disp: 90 tablet, Rfl: 3   tamsulosin  (FLOMAX ) 0.4 MG CAPS capsule, TAKE 2 CAPSULES BY MOUTH EVERY DAY, Disp: 180 capsule, Rfl: 3   Turmeric POWD, Take 5 mLs by mouth daily. Mixes 1 teaspoon in water every day and drinks it. Uses for arthritis. (Patient not taking: Reported on 09/30/2023), Disp: , Rfl:   Social History   Tobacco Use  Smoking Status Never   Passive exposure: Never  Smokeless Tobacco Never  Tobacco Comments   Never smoker; no plans to start    Allergies  Allergen Reactions   Metoprolol Succinate Other (See Comments)    Bradycardia (HR to 42)   Penicillins Itching    Has patient had a PCN reaction causing immediate rash, facial/tongue/throat swelling, SOB or lightheadedness with hypotension: No Has patient had a PCN reaction causing severe rash involving mucus membranes or skin necrosis: No Has patient had a PCN reaction that required hospitalization No Has patient  had a PCN reaction occurring within the last 10 years: No If all of the above answers are NO, then may proceed with Cephalosporin use.   Empagliflozin  Other (See Comments)    Dizzy , Dysphoria,  Patient preference to avoid use.    Objective:  There were no vitals filed for this visit. There is no height or weight on file to calculate BMI. Constitutional Well developed. Well nourished.  Vascular Dorsalis pedis pulses palpable bilaterally. Posterior tibial pulses palpable bilaterally. Capillary refill normal to all digits.  No cyanosis  or clubbing noted. Pedal hair growth normal.  Neurologic Normal speech. Oriented to person, place, and time. Epicritic sensation to light touch grossly present bilaterally.  Dermatologic Left first metatarsophalangeal joint ulceration with fat layer exposed.  Does not probe down to deep tissue.  No purulent drainage noted.  Serosanguineous fluid expressed.  No other clinical signs of infection noted no cellulitis noted.  Orthopedic: Normal joint ROM without pain or crepitus bilaterally. No visible deformities. No bony tenderness.   Radiographs: None Assessment:   No diagnosis found.  Plan:  Patient was evaluated and treated and all questions answered.  Left first metatarsophalangeal joint ulceration with fat layer exposed - All questions and concerns were discussed with the patient in extensive detail - Patient given that he is a diabetic he is at high risk of undergoing amputation - Continue doing doxycycline .  Given the findings of osteomyelitis patient will benefit from IV antibiotics.  However he would like to pursue this as an outpatient.  We will attempt to set up the patient with infusion center for outpatient antibiotics if possible.  I discussed with him if the infection gets worse to go to the emergency room right away he states understanding.  -Patient is a high risk for undergoing internal amputation. 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  I discussed with them in extensive detail that patient would benefit from first MPJ resection however he continues to refuse internal amputation.  I discussed with him that he could end up losing the foot if the infection worsens.  He states understanding would like to focus on antibiotic and local wound care as primary management

## 2024-03-20 NOTE — Telephone Encounter (Signed)
 Auth Submission: NO AUTH NEEDED Site of care: [facility] Payer: [employer] MEDICARE Medication & CPT/J Code(s) submitted: Dalvance (Dalbavancin) [id number] Diagnosis Code: M86.179 Route of submission (phone, fax, portal):  Phone # Fax # Auth type: Buy/Bill HB Units/visits requested: 750MG  x 1 dose, then 375mg  7 days later Reference number: [id number] Approval from: 03/20/24 to 07/08/24    Dagoberto Armour, CPhT [facility] Phone: [phone] 03/20/2024

## 2024-03-24 ENCOUNTER — Inpatient Hospital Stay (HOSPITAL_COMMUNITY): Admission: RE | Admit: 2024-03-24 | Source: Ambulatory Visit

## 2024-03-27 ENCOUNTER — Ambulatory Visit (INDEPENDENT_AMBULATORY_CARE_PROVIDER_SITE_OTHER)

## 2024-03-27 ENCOUNTER — Telehealth: Payer: Self-pay

## 2024-03-27 VITALS — BP 139/77 | HR 93 | Ht 73.0 in | Wt 199.2 lb

## 2024-03-27 DIAGNOSIS — R209 Unspecified disturbances of skin sensation: Secondary | ICD-10-CM | POA: Diagnosis not present

## 2024-03-27 NOTE — Assessment & Plan Note (Signed)
 Exam is benign with normal neuro exam and patient is not presenting with any concerning symptoms as mentioned in HPI. BP is 139/77 which is within range. Given his presentation and my exam, I am not concerned about stroke, TIA, or MI at this time and do not believe further evaluation is necessary. This is likely 2/2 to sleeping position in recliner. Patient also has a history of arthritis which may be a contributing factor. Extensively discussed red flag symptoms with patient and instructed him to present to the ED. - Continue to monitor - Continue all medications as prescribed - Return precautions discussed

## 2024-03-27 NOTE — Telephone Encounter (Signed)
 Patient calls nurse line regarding visit for BP check. He is also reporting that he is having right sided arm pain since waking up this AM.   He denies injury, however, felt that he slept on it wrong.   He is not having any other symptoms.   Scheduled patient for this morning with Dr. Jerrie.   Chiquita JAYSON English, RN

## 2024-03-27 NOTE — Patient Instructions (Addendum)
 Thank you for visiting the clinic today, it was good to see you!  Our plans for today: - I am not concerned about a stroke or heart attack. You are not experiencing any symptoms I am worried about and your exam is completely normal. - Please go to the Emergency Department if you experience any of the symptoms we discussed during our visit today.  Please arrive 15 minutes PRIOR to your next scheduled appointment time! If you do not, this affects OTHER patients' care.  For any questions, please call the office at [phone] or send me a message in MyChart.  It was a pleasure to take care of you today. Have a great day!  Geryl Dohn, DO [facility] Resident, PGY-1  -------------------------------------------------------------------------------  Do you need your medications delivered to your home?   We'll send your prescription to the   Pharmacy for delivery.          Address: [address] [place], [place], [place] [address]          Phone: [phone]  Please call the [facility] to speak with a pharmacist and set up your home medication delivery. If you have any questions, feel free to contact us  -- we're happy to help!  Other [facility] that offer affordable prices on both prescriptions and over-the-counter items, as well as convenient services like vaccinations, are  [facility], at [facility]         Address:  [address], [place], [place] [address]         Phone: [phone]  [facility], located in [facility]        Address: [address] [place], [place] [address]        Phone: [phone]  [facility], at [facility]       Address: [address], [place], [place] [address]       Phone: [phone]  [facility], at [facility]       Address: [address],  [place], [place] [address]       Phone: [phone]

## 2024-03-27 NOTE — Progress Notes (Addendum)
    SUBJECTIVE:   CHIEF COMPLAINT / HPI:   Andrew [name] is a 68 y.o.male who presents with R arm numbness since this morning. Patient states he woke up around 5AM, sat in his recliner, took his medications, fell asleep and woke up again around 8:30/9 AM and noticed that his arm felt lazy, maybe with some numbness. Sensation started in his shoulder and went down to his wrist. It is now only in his forearm and is improved from this AM. He is unsure if he slept on his arm while in the recliner but he often tosses and turns when sleeping in his bed and reports he probably did sleep on his arm earlier in the night. He has experienced this once before several months ago but it resolved on its own so he did not think anything of it. Patient has previously had strokes so was concerned about this presentation. He is taking all medications as prescribed and checks his blood sugars daily (average 155). Patient denies chest pain, SOB, diaphoresis, sensation of impending doom, N/V, lightheadedness, dizziness, HA or back pain.   PERTINENT  PMH / PSH: History of stroke, T2DM, HLD, HTN, CKD, arthritis  OBJECTIVE:   BP 139/77   Pulse 93   Ht 6' 1 (1.854 m)   Wt 199 lb 3.2 oz (90.4 kg)   SpO2 100%   BMI 26.28 kg/m    General: Alert, well-appearing male in NAD.  HEENT: Normocephalic, atraumatic. Neck: Supple, normal ROM, no LAD, no thyromegaly, no focal tenderness Cardiovascular: RRR, no m/r/g appreciated. Radial pulse +2 bilaterally Pulmonary: Normal WOB. CTAB with no w/c/r present. Abdomen: Soft, non-tender, non-distended. Extremities: Warm and well-perfused, without cyanosis or edema. Moves all extremities equally Neurologic: CNII-XII intact: PERRLA, EOMI, facial sensation intact to light touch bilaterally, Patient does have some facial droop from a previous stroke but no new deficits noted and movement otherwise normal, hearing intact to conversation, tongue protrusion symmetric, tongue  movement wnl, trapezius strength 5/5 bilaterally. Strength 5/5 throughout. Sensation intact throughout to light touch Skin: No rashes or lesions. Psych: Appropriate mood and affect   ASSESSMENT/PLAN:   Assessment & Plan Abnormal arm sensation Exam is benign with normal neuro exam and patient is not presenting with any concerning symptoms as mentioned in HPI. BP is 139/77 which is within range. Given his presentation and my exam, I am not concerned about stroke, TIA, or MI at this time and do not believe further evaluation is necessary. This is likely 2/2 to sleeping position in recliner. Patient also has a history of arthritis which may be a contributing factor. Extensively discussed red flag symptoms with patient and instructed him to present to the ED. - Continue to monitor - Continue all medications as prescribed - Return precautions discussed  Darren Jernigan, DO [facility]

## 2024-03-31 ENCOUNTER — Inpatient Hospital Stay (HOSPITAL_COMMUNITY): Admission: RE | Admit: 2024-03-31 | Source: Ambulatory Visit

## 2024-04-02 ENCOUNTER — Other Ambulatory Visit: Payer: Self-pay | Admitting: Family Medicine

## 2024-04-02 DIAGNOSIS — M5430 Sciatica, unspecified side: Secondary | ICD-10-CM

## 2024-04-07 ENCOUNTER — Inpatient Hospital Stay (HOSPITAL_COMMUNITY): Admission: RE | Admit: 2024-04-07 | Source: Ambulatory Visit

## 2024-04-07 NOTE — Telephone Encounter (Signed)
 Left message informing patient pens are still ready for pickup.

## 2024-04-08 ENCOUNTER — Encounter: Admitting: Podiatry

## 2024-04-10 ENCOUNTER — Ambulatory Visit: Admitting: Podiatry

## 2024-04-12 ENCOUNTER — Other Ambulatory Visit: Payer: Self-pay | Admitting: Family Medicine

## 2024-04-12 DIAGNOSIS — I10 Essential (primary) hypertension: Secondary | ICD-10-CM

## 2024-04-12 DIAGNOSIS — E039 Hypothyroidism, unspecified: Secondary | ICD-10-CM

## 2024-04-14 ENCOUNTER — Encounter (HOSPITAL_COMMUNITY)

## 2024-04-16 DIAGNOSIS — H35712 Central serous chorioretinopathy, left eye: Secondary | ICD-10-CM | POA: Diagnosis not present

## 2024-04-16 DIAGNOSIS — H5203 Hypermetropia, bilateral: Secondary | ICD-10-CM | POA: Diagnosis not present

## 2024-04-16 DIAGNOSIS — E119 Type 2 diabetes mellitus without complications: Secondary | ICD-10-CM | POA: Diagnosis not present

## 2024-04-16 LAB — OPHTHALMOLOGY REPORT-SCANNED

## 2024-04-17 ENCOUNTER — Ambulatory Visit: Admitting: Podiatry

## 2024-04-20 ENCOUNTER — Other Ambulatory Visit (HOSPITAL_COMMUNITY): Payer: Self-pay

## 2024-04-20 ENCOUNTER — Telehealth: Payer: Self-pay

## 2024-04-20 ENCOUNTER — Encounter (HOSPITAL_COMMUNITY): Payer: Self-pay | Admitting: Podiatry

## 2024-04-20 NOTE — Telephone Encounter (Signed)
   2026 changes for Ozempic  (Novo Nordisk): Medicare patients will no longer be eligible for Ozempic .  _____________________________________  Pt now has Part D [employer] coverage: Ozempic  $47 for month supply Farxiga  $47 for month supply

## 2024-04-22 ENCOUNTER — Ambulatory Visit: Admitting: Podiatry

## 2024-04-22 ENCOUNTER — Encounter

## 2024-05-01 ENCOUNTER — Ambulatory Visit (INDEPENDENT_AMBULATORY_CARE_PROVIDER_SITE_OTHER)

## 2024-05-01 DIAGNOSIS — Z23 Encounter for immunization: Secondary | ICD-10-CM | POA: Diagnosis not present

## 2024-05-01 NOTE — Progress Notes (Signed)
 Patient presents to nurse clinic for flu and covid vaccines. Vaccines administered without complications.  See admin for details.   Patient scheduled for diabetic FU for 07/14/2024 with PCP.

## 2024-05-08 ENCOUNTER — Other Ambulatory Visit (HOSPITAL_COMMUNITY): Payer: Self-pay

## 2024-06-21 ENCOUNTER — Other Ambulatory Visit: Payer: Self-pay | Admitting: Family Medicine

## 2024-06-21 DIAGNOSIS — E119 Type 2 diabetes mellitus without complications: Secondary | ICD-10-CM

## 2024-07-03 ENCOUNTER — Other Ambulatory Visit: Payer: Self-pay | Admitting: Family Medicine

## 2024-07-06 ENCOUNTER — Other Ambulatory Visit: Payer: Self-pay | Admitting: Family Medicine

## 2024-07-13 ENCOUNTER — Other Ambulatory Visit (HOSPITAL_COMMUNITY): Payer: Self-pay

## 2024-07-14 ENCOUNTER — Ambulatory Visit: Admitting: Family Medicine

## 2024-07-14 ENCOUNTER — Encounter: Payer: Self-pay | Admitting: Family Medicine

## 2024-07-14 VITALS — BP 120/77 | HR 80 | Ht 73.0 in | Wt 193.2 lb

## 2024-07-14 DIAGNOSIS — E039 Hypothyroidism, unspecified: Secondary | ICD-10-CM | POA: Diagnosis not present

## 2024-07-14 DIAGNOSIS — Z794 Long term (current) use of insulin: Secondary | ICD-10-CM | POA: Diagnosis not present

## 2024-07-14 DIAGNOSIS — I10 Essential (primary) hypertension: Secondary | ICD-10-CM

## 2024-07-14 DIAGNOSIS — M5430 Sciatica, unspecified side: Secondary | ICD-10-CM

## 2024-07-14 DIAGNOSIS — E119 Type 2 diabetes mellitus without complications: Secondary | ICD-10-CM

## 2024-07-14 LAB — POCT GLYCOSYLATED HEMOGLOBIN (HGB A1C): HbA1c, POC (controlled diabetic range): 8 % — AB (ref 0.0–7.0)

## 2024-07-14 MED ORDER — DAPAGLIFLOZIN PROPANEDIOL 10 MG PO TABS: 10.0000 mg | ORAL_TABLET | Freq: Every day | ORAL | 3 refills | Status: AC

## 2024-07-14 MED ORDER — ASPIRIN EC 81 MG PO TBEC: 81.0000 mg | DELAYED_RELEASE_TABLET | Freq: Every day | ORAL | 2 refills | Status: AC

## 2024-07-14 MED ORDER — LEVOTHYROXINE SODIUM 75 MCG PO TABS: 75.0000 ug | ORAL_TABLET | Freq: Every day | ORAL | 2 refills | Status: AC

## 2024-07-14 MED ORDER — METFORMIN HCL ER 500 MG PO TB24: 1000.0000 mg | ORAL_TABLET | Freq: Two times a day (BID) | ORAL | 3 refills | Status: AC

## 2024-07-14 MED ORDER — LANTUS SOLOSTAR 100 UNIT/ML ~~LOC~~ SOPN: 12.0000 [IU] | PEN_INJECTOR | Freq: Every day | SUBCUTANEOUS | 6 refills | Status: AC

## 2024-07-14 MED ORDER — ALLOPURINOL 100 MG PO TABS: 100.0000 mg | ORAL_TABLET | Freq: Every day | ORAL | 11 refills | Status: DC

## 2024-07-14 MED ORDER — ROSUVASTATIN CALCIUM 20 MG PO TABS: 20.0000 mg | ORAL_TABLET | Freq: Every day | ORAL | 2 refills | Status: AC

## 2024-07-14 MED ORDER — LOSARTAN POTASSIUM 100 MG PO TABS: 100.0000 mg | ORAL_TABLET | Freq: Every day | ORAL | 3 refills | Status: AC

## 2024-07-14 MED ORDER — GABAPENTIN 100 MG PO CAPS: 100.0000 mg | ORAL_CAPSULE | Freq: Three times a day (TID) | ORAL | 11 refills | Status: AC | PRN

## 2024-07-14 MED ORDER — AMLODIPINE BESYLATE 10 MG PO TABS: 10.0000 mg | ORAL_TABLET | Freq: Every day | ORAL | 2 refills | Status: AC

## 2024-07-14 MED ORDER — SEMAGLUTIDE(0.25 OR 0.5MG/DOS) 2 MG/3ML ~~LOC~~ SOPN: 0.5000 mg | PEN_INJECTOR | SUBCUTANEOUS | 2 refills | Status: AC

## 2024-07-14 MED ORDER — CLONIDINE HCL 0.1 MG PO TABS: 0.1000 mg | ORAL_TABLET | Freq: Two times a day (BID) | ORAL | 2 refills | Status: AC

## 2024-07-14 NOTE — Patient Instructions (Addendum)
 1) Your A1c is 8.0, which is higher than before. Our goal is to get it under 7. Let's increase your medications to help get your diabetes under control. - Increase your Ozempic  to 0.5mg  once a WEEK.   - You may have some stomach upset for the first few weeks, which will improve as you keep taking it.    - Increase your metformin  to 1000mg  TWICE a DAY  2) At the end of February, if you are tolerating the higher ozempic  dose, then send me a message on MyChart and I can send in the next higher dose.  3) Your blood pressure is looking great!  Come back to see me in about 2-3 months. Bring your glucometer with you so we can look at your sugars together.

## 2024-07-14 NOTE — Assessment & Plan Note (Addendum)
 A1c 8.0, above goal and uptrending.  - Increase Ozempic  0.25mg  to 0.5mg  for 4 weeks. Advised to reach out via MyChart if tolerating well, then I will send in 1mg  weekly dose - cont Glargine 12u daily - cont Farxiga  10mg  - increase Metformin  1000daily to BID - f/u in 2-3 months, review glucometer

## 2024-07-14 NOTE — Progress Notes (Signed)
" ° ° °  SUBJECTIVE:   CHIEF COMPLAINT / HPI:   Discussed the use of AI scribe software for clinical note transcription with the patient, who gave verbal consent to proceed.  History of Present Illness Andrew [name] is a 69 year old male with diabetes who presents for diabetes management.  Glycemic control - A1c increased from 7.5% (six months ago) to 8.0% (current) - Morning blood glucose levels range from 121 mg/dL to low 799 mg/dL - Monitors blood glucose daily using finger prick method  Antihyperglycemic medication regimen - Insulin  glargine 12 units once in the morning - Metformin  500 mg, two tablets in the morning - Farxiga  once daily - Ozempic  0.25 mg once weekly, administered by counting clicks on the pen - Takes two additional pills at night, names unknown     PERTINENT  PMH / PSH: T2DM  OBJECTIVE:   BP 120/77   Pulse 80   Ht 6' 1 (1.854 m)   Wt 193 lb 4 oz (87.7 kg)   SpO2 98%   BMI 25.50 kg/m   General: Alert, pleasant man. NAD. HEENT: NCAT. MMM. CV: RRR, no murmurs. Resp: CTAB, no wheezing or crackles. Normal WOB on RA.   Ext: Moves all ext spontaneously Skin: Warm, well perfused   ASSESSMENT/PLAN:   Assessment & Plan Type 2 diabetes mellitus without complication, with long-term current use of insulin  (HCC) A1c 8.0, above goal and uptrending.  - Increase Ozempic  0.25mg  to 0.5mg  for 4 weeks. Advised to reach out via MyChart if tolerating well, then I will send in 1mg  weekly dose - cont Glargine 12u daily - cont Farxiga  10mg  - increase Metformin  1000daily to BID - f/u in 2-3 months, review glucometer     Twyla Nearing, MD [facility] "

## 2024-07-15 LAB — MICROALBUMIN / CREATININE URINE RATIO
Creatinine, Urine: 68.2 mg/dL
Microalb/Creat Ratio: 66 mg/g{creat} — ABNORMAL HIGH (ref 0–29)
Microalbumin, Urine: 45 ug/mL

## 2024-07-19 ENCOUNTER — Other Ambulatory Visit: Payer: Self-pay | Admitting: Family Medicine

## 2024-11-05 ENCOUNTER — Encounter
